# Patient Record
Sex: Female | Born: 1950 | Race: Black or African American | Hispanic: No | Marital: Married | Smoking: Never smoker
Health system: Southern US, Community
[De-identification: ages and names within clinical notes are randomized; demographics above are authoritative.]

## PROBLEM LIST (undated history)

## (undated) DIAGNOSIS — Z8739 Personal history of other diseases of the musculoskeletal system and connective tissue: Secondary | ICD-10-CM

## (undated) DIAGNOSIS — I10 Essential (primary) hypertension: Secondary | ICD-10-CM

## (undated) DIAGNOSIS — L0390 Cellulitis, unspecified: Secondary | ICD-10-CM

## (undated) DIAGNOSIS — L97109 Non-pressure chronic ulcer of unspecified thigh with unspecified severity: Secondary | ICD-10-CM

## (undated) DIAGNOSIS — Z8614 Personal history of Methicillin resistant Staphylococcus aureus infection: Secondary | ICD-10-CM

## (undated) DIAGNOSIS — E119 Type 2 diabetes mellitus without complications: Secondary | ICD-10-CM

## (undated) DIAGNOSIS — I2699 Other pulmonary embolism without acute cor pulmonale: Secondary | ICD-10-CM

## (undated) HISTORY — DX: Personal history of Methicillin resistant Staphylococcus aureus infection: Z86.14

## (undated) HISTORY — DX: Non-pressure chronic ulcer of unspecified thigh with unspecified severity: L97.109

## (undated) HISTORY — DX: Personal history of other diseases of the musculoskeletal system and connective tissue: Z87.39

## (undated) HISTORY — DX: Essential (primary) hypertension: I10

## (undated) HISTORY — DX: Cellulitis, unspecified: L03.90

## (undated) HISTORY — DX: Type 2 diabetes mellitus without complications: E11.9

## (undated) HISTORY — DX: Other pulmonary embolism without acute cor pulmonale: I26.99

## (undated) HISTORY — PX: PARTIAL HYSTERECTOMY: SHX80

---

## 2001-09-19 ENCOUNTER — Encounter: Payer: Self-pay | Admitting: *Deleted

## 2001-09-19 ENCOUNTER — Emergency Department (HOSPITAL_COMMUNITY): Admission: EM | Admit: 2001-09-19 | Discharge: 2001-09-19 | Payer: Self-pay | Admitting: *Deleted

## 2001-09-24 ENCOUNTER — Ambulatory Visit (HOSPITAL_COMMUNITY): Admission: RE | Admit: 2001-09-24 | Discharge: 2001-09-24 | Payer: Self-pay | Admitting: Pulmonary Disease

## 2001-10-15 ENCOUNTER — Ambulatory Visit (HOSPITAL_COMMUNITY): Admission: RE | Admit: 2001-10-15 | Discharge: 2001-10-15 | Payer: Self-pay | Admitting: Internal Medicine

## 2001-10-22 ENCOUNTER — Encounter: Payer: Self-pay | Admitting: Obstetrics & Gynecology

## 2001-10-22 ENCOUNTER — Ambulatory Visit (HOSPITAL_COMMUNITY): Admission: RE | Admit: 2001-10-22 | Discharge: 2001-10-22 | Payer: Self-pay | Admitting: Obstetrics & Gynecology

## 2004-12-26 ENCOUNTER — Ambulatory Visit (HOSPITAL_COMMUNITY): Admission: RE | Admit: 2004-12-26 | Discharge: 2004-12-26 | Payer: Self-pay | Admitting: Pulmonary Disease

## 2005-01-02 ENCOUNTER — Ambulatory Visit: Payer: Self-pay | Admitting: Orthopedic Surgery

## 2005-01-23 ENCOUNTER — Ambulatory Visit: Payer: Self-pay | Admitting: Orthopedic Surgery

## 2005-04-17 ENCOUNTER — Ambulatory Visit: Payer: Self-pay | Admitting: Orthopedic Surgery

## 2005-07-10 ENCOUNTER — Ambulatory Visit: Payer: Self-pay | Admitting: Orthopedic Surgery

## 2005-10-09 ENCOUNTER — Ambulatory Visit: Payer: Self-pay | Admitting: Orthopedic Surgery

## 2006-01-08 ENCOUNTER — Ambulatory Visit: Payer: Self-pay | Admitting: Orthopedic Surgery

## 2006-02-20 HISTORY — PX: OTHER SURGICAL HISTORY: SHX169

## 2006-04-10 ENCOUNTER — Ambulatory Visit: Payer: Self-pay | Admitting: Orthopedic Surgery

## 2006-07-10 ENCOUNTER — Ambulatory Visit: Payer: Self-pay | Admitting: Orthopedic Surgery

## 2006-09-11 ENCOUNTER — Ambulatory Visit: Payer: Self-pay | Admitting: Orthopedic Surgery

## 2006-09-11 DIAGNOSIS — Z8679 Personal history of other diseases of the circulatory system: Secondary | ICD-10-CM | POA: Insufficient documentation

## 2006-09-11 DIAGNOSIS — E119 Type 2 diabetes mellitus without complications: Secondary | ICD-10-CM | POA: Insufficient documentation

## 2007-01-02 ENCOUNTER — Ambulatory Visit (HOSPITAL_COMMUNITY): Admission: RE | Admit: 2007-01-02 | Discharge: 2007-01-02 | Payer: Self-pay | Admitting: General Surgery

## 2007-01-02 ENCOUNTER — Encounter (INDEPENDENT_AMBULATORY_CARE_PROVIDER_SITE_OTHER): Payer: Self-pay | Admitting: General Surgery

## 2007-01-28 ENCOUNTER — Ambulatory Visit (HOSPITAL_COMMUNITY): Admission: RE | Admit: 2007-01-28 | Discharge: 2007-01-28 | Payer: Self-pay | Admitting: General Surgery

## 2007-01-28 ENCOUNTER — Encounter (INDEPENDENT_AMBULATORY_CARE_PROVIDER_SITE_OTHER): Payer: Self-pay | Admitting: General Surgery

## 2007-02-07 ENCOUNTER — Inpatient Hospital Stay (HOSPITAL_COMMUNITY): Admission: AD | Admit: 2007-02-07 | Discharge: 2007-02-13 | Payer: Self-pay | Admitting: General Surgery

## 2007-02-21 DIAGNOSIS — I2699 Other pulmonary embolism without acute cor pulmonale: Secondary | ICD-10-CM

## 2007-02-21 HISTORY — DX: Other pulmonary embolism without acute cor pulmonale: I26.99

## 2007-03-18 ENCOUNTER — Inpatient Hospital Stay (HOSPITAL_COMMUNITY): Admission: EM | Admit: 2007-03-18 | Discharge: 2007-03-22 | Payer: Self-pay | Admitting: Emergency Medicine

## 2007-04-07 ENCOUNTER — Observation Stay (HOSPITAL_COMMUNITY): Admission: EM | Admit: 2007-04-07 | Discharge: 2007-04-09 | Payer: Self-pay | Admitting: Emergency Medicine

## 2007-04-08 ENCOUNTER — Encounter (HOSPITAL_BASED_OUTPATIENT_CLINIC_OR_DEPARTMENT_OTHER): Admission: RE | Admit: 2007-04-08 | Discharge: 2007-04-20 | Payer: Self-pay | Admitting: Surgery

## 2007-04-21 ENCOUNTER — Encounter (HOSPITAL_BASED_OUTPATIENT_CLINIC_OR_DEPARTMENT_OTHER): Admission: RE | Admit: 2007-04-21 | Discharge: 2007-07-20 | Payer: Self-pay | Admitting: Surgery

## 2007-07-22 ENCOUNTER — Encounter (HOSPITAL_BASED_OUTPATIENT_CLINIC_OR_DEPARTMENT_OTHER): Admission: RE | Admit: 2007-07-22 | Discharge: 2007-08-20 | Payer: Self-pay | Admitting: Surgery

## 2007-09-04 ENCOUNTER — Encounter (HOSPITAL_BASED_OUTPATIENT_CLINIC_OR_DEPARTMENT_OTHER): Admission: RE | Admit: 2007-09-04 | Discharge: 2007-11-15 | Payer: Self-pay | Admitting: Internal Medicine

## 2007-09-30 ENCOUNTER — Emergency Department (HOSPITAL_COMMUNITY): Admission: EM | Admit: 2007-09-30 | Discharge: 2007-09-30 | Payer: Self-pay | Admitting: Emergency Medicine

## 2007-11-15 ENCOUNTER — Encounter (HOSPITAL_BASED_OUTPATIENT_CLINIC_OR_DEPARTMENT_OTHER): Admission: RE | Admit: 2007-11-15 | Discharge: 2007-12-31 | Payer: Self-pay | Admitting: Internal Medicine

## 2010-07-05 NOTE — Assessment & Plan Note (Signed)
[facility]   NAME:  [name], [name]                 ACCOUNT NO.:  [id number]   MEDICAL RECORD NO.:  [id number]      DATE OF BIRTH:  Nov 30, 1950   PHYSICIAN:  Jonelle Sports. Sevier, M.D.       VISIT DATE:                                   OFFICE VISIT   HISTORY:  This is a 60 year old black female morbidly obese, who is seen  with areas of fat necrosis on the medial aspects of her right thigh and  one posteriorly on that leg just above the knee.  These have previously  been debrided and most recently been treated simply with daily  cleansing, rinsing with Dakin's solution, and dry dressing.   She reports today no new problems since the last visit here and she  feels as if the posterior lesion has indeed healed.   PHYSICAL EXAMINATION:  VITAL SIGNS:  Blood pressure 130/84, pulse 84,  respirations 18, and temperature 97.9.  Capillary blood glucose 79  mg/dL.   The lesion on the right posterior thigh just proximal to the knee crease  is indeed healed.  The lesion on the right medial thigh measures 0.6 x  1.3 x 0.1 cm is quite superficial and the larger lesion measuring 5.0 x  3.9 x 2.0 cm with a small tract of 2.1 cm at the 4 o'clock position is  noted on the redundant skin from the proximal medial thigh.   It has small amount of fibrinopurulent exudate in the base, but  generally is reasonably granular.   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   DISPOSITION:  1. The wound on the right posterior thigh is resolved and requires no      treatment.  2. The small superficial wound on the right medial distal thigh is to      be cleansed daily with antibacterial soap and treated with      application of zinc oxide ointment.   The larger wound on the redundant skin of the proximal thigh is to be  cleansed daily with antibacterial soap then irrigated with 0.25% Dakin  solution and then packed with moist gauze.   In the clinic today  that latter wound is packed simply with dry gauze.   The patient will continue these interventions with assistance of a home  health nurse on twice daily basis and her husband on other days and will  be seen again in followup in 2 weeks.           ______________________________  Jonelle Sports Cheryll Cockayne, M.D.     RES/MEDQ  D:  06/12/2007  T:  06/13/2007  Job:  [id number]

## 2010-07-05 NOTE — Assessment & Plan Note (Signed)
[facility]   NAME:  [name], [name]                 ACCOUNT NO.:  [id number]   MEDICAL RECORD NO.:  [id number]      DATE OF BIRTH:  Jul 11, 1950   PHYSICIAN:  Jake Shark A. Tanda Rockers, M.D. VISIT DATE:  05/14/2007                                   OFFICE VISIT   SUBJECTIVE:  Ms. [name] returns for followup of an abscess related to  fatty necrosis involving the medial thighs.  There has been an interim  management by the home health nurse with daily irrigations with 0.25%  Dakin's solution and loose packing.  There has been no interim fever or  excessive drainage or malodor.   OBJECTIVE:  Blood pressure is 154/77, respirations 18, pulse rate 88,  temperature 97.4.  Capillary blood glucose is 84 mg percent.  Inspection  of the wound shows that the right medial distal thigh ulcer is  completely granulated.  There is evidence of contraction with advancing  epithelium.  The proximal right medial thigh wound has contracted.  There is less volume.  The tract previously noted at 12 o'clock has  decreased significantly.  There is no malodor.  The tract is still  present,  but it measures 5 cm when sounded with a Q-tip.  Wound #3 on  the posterior thigh is 100% granulated with advancing epithelium and no  excessive drainage.   ASSESSMENT:  Improvement of abscess cavity.   PLAN:  We will continue the daily irrigations with 0.25% Dakin's  solution,  continued antiseptic soap washes and reevaluate the patient  in 2 weeks.  The home health nurse will continue the daily visits.      Harold A. Tanda Rockers, M.D.  Electronically Signed     HAN/MEDQ  D:  05/14/2007  T:  05/14/2007  Job:  [id number]

## 2010-07-05 NOTE — Discharge Summary (Signed)
NAME:  [name], [name]                 ACCOUNT NO.:  [id number]   MEDICAL RECORD NO.:  [id number]          PATIENT TYPE:  INP   LOCATION:  A206                          FACILITY:  [facility]   PHYSICIAN:  Edward L. Juanetta Gosling, M.D.DATE OF BIRTH:  [birth year]   DATE OF ADMISSION:  03/17/2007  DATE OF DISCHARGE:  01/30/2009LH                               DISCHARGE SUMMARY   FINAL DISCHARGE DIAGNOSES:  1. Bilateral pulmonary emboli.  2. 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  3. Osteoarthritis.  4. Diabetes mellitus.  5. Morbid obesity.  6. Hypertension.  7. Anemia.   HISTORY:  [name] is a 60 year old morbidly obese female who had been  feeling fairly well until about 6 o'clock on the night of admission, and  at that point, she developed a fluttering sensation in her chest.  She  became more short of breath.  She came to the emergency room had D-dimer  which was significantly elevated.  She had multiple large pulmonary  emboli.  She has had a recent surgery on an ulceration on her thigh.   PHYSICAL EXAMINATION:  VITAL SIGNS:  Temperature is 98, blood pressure  126/81, O2 sat 100%, heart rate 109, respirations 17, initially 25 down  to 17.  CHEST:  Fairly clear.  EXTREMITIES:  She had ulcerations of the pannus, one on the right thigh.   LABORATORY DATA:  White count was 12,300, hemoglobin 10.8   HOSPITAL COURSE:  She was treated with intravenous heparin initially,  then treated with Lovenox.  She was bridged with Coumadin.  Had  consultation with Dr. Leticia Penna to reevaluate her leg wounds, and he  ordered dressings.  By the time of discharge, she was therapeutically  anticoagulated on Coumadin.  She was discharged home on Coumadin 5 mg  daily with daily prothrombin times from home health.   DISCHARGE MEDICATIONS:  1. Doxycycline 100 mg x b.i.d. x7 more days.  2. Actos 200 mg daily.  3. Celebrex 2 mg b.i.d.  4. Fluconazole:  She has actually finished the fluconazole.  5.  Lasix 40 mg daily.  6. Glyburide.  7. Metformin 500 b.i.d.  8. Tylox one q.6 h p.r.n. pain.  9. Verapamil 240 mg daily.  10.Trandolapril 4 mg daily.   DISCHARGE INSTRUCTIONS:  She is going to have home health for physical  therapy for wound care, etc.  She is going to follow-up in my office in  about 2 weeks.      Edward L. Juanetta Gosling, M.D.  Electronically Signed     ELH/MEDQ  D:  03/22/2007  T:  03/22/2007  Job:  [id number]

## 2010-07-05 NOTE — Group Therapy Note (Signed)
NAME:  Woolverton, Acelyn                 ACCOUNT NO.:  [id number]   MEDICAL RECORD NO.:  [id number]          PATIENT TYPE:  OBV   LOCATION:  A304                          FACILITY:  [facility]   PHYSICIAN:  Edward L. Juanetta Gosling, M.D.DATE OF BIRTH:  1950-06-12   DATE OF PROCEDURE:  DATE OF DISCHARGE:                                 PROGRESS NOTE   Ms. Quimby was admitted yesterday with hypoglycemia.  She says she is  better but she is still on dextrose containing IV fluids.  She is  actually set to be admitted to [facility] tomorrow.  Otherwise she says she is feeling okay.  She had pretty significant  changes when she had the hypoglycemia, and actually was thought for  sometime to have had a stroke.   EXAM:  This morning shows temperature is 97.1, pulse 71, respirations  20, blood pressure 160/80, O2 sats 98% on room air blood sugar is 140  and was 150 earlier.   ASSESSMENT:  She has hypoglycemia this is from oral hypoglycemic agents,  so may be more of a problem.  I am going to see what happens if we cut  her from the D5 have her eat, and see if her sugar goes down.  I may  just try to have her directly transferred from here to the [facility].      Edward L. Juanetta Gosling, M.D.  Electronically Signed     ELH/MEDQ  D:  04/08/2007  T:  04/08/2007  Job:  [id number]

## 2010-07-05 NOTE — Assessment & Plan Note (Signed)
[facility]   NAME:  [name], [name]                 ACCOUNT NO.:  [id number]   MEDICAL RECORD NO.:  [id number]      DATE OF BIRTH:  Jul 15, 1950   PHYSICIAN:  Jake Shark A. Tanda Rockers, M.D. VISIT DATE:  04/23/2007                                   OFFICE VISIT   SUBJECTIVE:  [name] is a 60 year old lady who we are treating for  lower extremity fatty necrosis.  In the interim, she has had  continuation of home health nurse visits for irrigation of the wounds in  addition to b.i.d. showers with antiseptic soap.  There has been an  extremely malodorous drainage from the medial aspect of the right lower  extremity.  There has been a sensation of fever, but the patient does  not take her temperature.   OBJECTIVE:  Blood pressure is 160/95, respirations 20, pulse rate 97,  temperature 97.7. She is accompanied by a family member.  Inspection of the lower extremity shows that there are prominent areas  of adipose tissue medially with the right medial distal thigh containing  a full-thickness ulcer with healthy granulation.  This wound measures  3.4 cm in its broadest diameter.  The wound was photographed and  cataloged.  Wound #2 is a highly indurated area measuring 5 cm in  diameter and extending to depth of 5-6 cm.  There is old malodorous  necrotic fatty tissue associated with some old hemorrhage loculations  which were broken down thus disrupting a frank abscess from the  posterior medial area.  The wound was irrigated with saline.  The wound  #3 on the right posterior thigh is completely granulated with evidence  of advancing epithelium.   ASSESSMENT:  Adequately drained abscess, IE, wound #2 with granulating  wounds numbers 1 and 3, please refer to the data entries and photos.   PLAN:  We will continue daily irrigations and packing of wound #2 with a  1 inch Nu Gauze packing.  We have given these instructions to the home  health nurse.  Wounds one and three will continue  to be treated with  antiseptic soap washing and a moist moist dressing.  We will reevaluate  the patient in 1 week p.r.n. The culture has been taken.  No antibiotics  have been started however.      Harold A. Tanda Rockers, M.D.  Electronically Signed     HAN/MEDQ  D:  04/23/2007  T:  04/23/2007  Job:  [id number]

## 2010-07-05 NOTE — Op Note (Signed)
NAME:  Aderhold, Cathryn                 ACCOUNT NO.:  [id number]   MEDICAL RECORD NO.:  [id number]          PATIENT TYPE:  AMB   LOCATION:  DAY                           FACILITY:  [facility]   PHYSICIAN:  Tilford Pillar, MD      DATE OF BIRTH:  01/09/1951   DATE OF PROCEDURE:  DATE OF DISCHARGE:                               OPERATIVE REPORT   PREPROCEDURE DIAGNOSIS:  Right posterior thigh chronic wound/ulcer.   POSTPROCEDURE DIAGNOSIS:  Right posterior thigh chronic wound/ulcer.   PROCEDURE PERFORMED:  Right posterior thigh wound debridement.   SURGEON:  Tilford Pillar, MD   ANESTHESIA:  1% Lidocaine with epinephrine.   SPECIMENS:  Necrotic eschar.   ESTIMATED BLOOD LOSS:  Minimal.   INDICATIONS FOR PROCEDURE:  The patient is a 60 year old female with  history of diabetes and morbid obesity.  She had a chronic wound develop  on her right posteromedial thigh.  This had been treated unsuccessfully  over a several month period with no significant improvement.  She had  exquisite pain in this area and intermittent infections, fevers and  chills.  On evaluation as an outpatient it was advised that the patient  have this debrided due to the size of the wound, the location and doubts  of patient being able to adequately care for this area.  It was  recommended that a wound vac be placed; this is currently being  arranged.  The risks and benefits were discussed for debridement and we  will proceed with debridement at this time.   DESCRIPTION OF PROCEDURE:  The patient was taken to the minor procedure  room.  She was in the supine position.  Her thigh was flexed, allowing  exposure to the posterior aspect where the wound was located.  Sterile  drapes were placed after prepping the area with Betadine solution.  At  this point sharp dissection with a scalpel was utilized to dissect the  eschar free.  Lidocaine 1% with epinephrine was utilized for local  anesthetic upon reaching the healthy  tissue.  Also upon reaching healthy  tissue, good vascularity was encountered.  Electrocautery was utilized  to obtain hemostasis.  The wound was irrigated.  The eschar was sent to  Pathology.  A Betadine-soaked 4x4 gauze was placed for packing. This was  covered with a dry 4x4 and then an ABD dressing.  This was secured with  a Medipore dressing.  The patient tolerated the procedure well.  There  is significant inflammatory and edematous tissue around this area, but  no tracking.  No subcuticular abscesses were encountered.  The eschar  was removed down to healthy tissue.  The remaining wound defect is 3.5  cm wide and 3.5 cm in length and a depth of 2.5 cm.  At the conclusion  of the procedure all instrument, sponge and needle counts were correct.  The patient tolerated this well.   DISPOSITION:  To home with continued b.i.d. wet-to-dry dressing changes  which were described to the patient and the patient's husband, with plan  to follow up and expected placement of a  wound vac as an outpatient.      Tilford Pillar, MD  Electronically Signed     BZ/MEDQ  D:  01/02/2007  T:  01/03/2007  Job:  [id number]   cc:   Dr. Juanetta Gosling

## 2010-07-05 NOTE — Assessment & Plan Note (Signed)
[facility]   NAME:  [name], [name] NO.:  [id number]   MEDICAL RECORD NO.:  [id number]      DATE OF BIRTH:  [birth year]   PHYSICIAN:  Maxwell Caul, M.D. VISIT DATE:  11/22/2007                                   OFFICE VISIT   [name] is a very pleasant 60 year old woman who has three very  problematic ulcers that we have been following for some period of time  now.  She lives in [place] and is currently without insurance which  has limited our options in terms of consultations.  I have been  continually puzzled by the pathogenesis of these wounds and in fact, I  have biopsied the one on her left anterior thigh.  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  This certainly does not fit  with what I know about necrobiosis lipoidica which I have seen in  facilities on patients with chronically poor controlled diabetes.  These  are usually waxy plaques.  They are not in my mind in the pathogenesis  of huge ulcers.  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  I had wondered about a Dermatology consult but in the  absence of insurance, this is currently out of the question.   We have been applying Silvadene cream in these wounds.  She did culture  Proteus on November 12, 2007.  She was treated with Augmentin which  should cover this.   On examination, temperature is 98.3.  There are three very large wounds  here.  One we have labeled #2 is on her right abdominal pannus.  This is  a deep wound measuring 4.5 x 4.2 x 1.3 with 1 cm tunneling at 1 o'clock.  This does not look any different over the last month, certainly has not  progressed.  The area on the left anterior thigh looks worse to me  measuring 4.5 x 5 x 1.4.  This underwent an excisional debridement  of a  large amount of adherent material and subcutaneous material.  Nevertheless, I was not able to completely clean this out because of  pain.  She also has a wound, the most recent one on her upper abdomen  measuring 4.1 x 2 x 0.1, this is a clean granulated wound.   IMPRESSIONS:  Multiple ulcers as described above.  I continue to think  that this is some form of panniculitis.  I will look into this.  Want to  solicit consultation.  I would favor Dermatology.  Dr. Chilton Si mentioned  sending her to Plastics which might be easier within the organization.  In the meantime, I am going to continue to apply Silvadene-soaked gauze  to these wounds which she can change twice a day.  We will see her back  in 2 weeks.           ______________________________  Maxwell Caul, M.D.     MGR/MEDQ  D:  11/22/2007  T:  11/23/2007  Job:  [id number]

## 2010-07-05 NOTE — Group Therapy Note (Signed)
NAME:  Drudge, Avalyn                 ACCOUNT NO.:  [id number]   MEDICAL RECORD NO.:  [id number]          PATIENT TYPE:  INP   LOCATION:  A206                          FACILITY:  [facility]   PHYSICIAN:  Edward L. Juanetta Gosling, M.D.DATE OF BIRTH:  09-22-1950   DATE OF PROCEDURE:  03/21/2007  DATE OF DISCHARGE:                                 PROGRESS NOTE   Ms. Rashid is overall doing better.  She did have her ultrasound of the  arm yesterday, and her PIC line apparently is okay, at least no  thrombus.  Her blood pressure today is 152/84.  Temperature 99.5.  Pulse  94.  Respirations 17.  Blood sugar 109.  Her O2 saturation is 97%.  Her  leg looks about the same.   ASSESSMENT:  I think she is doing fairly well.  I am concerned about her  ambulation, so I am going to go ahead and ask for Physical Therapy  consultation, and try to get her up and moving around.  See if we can  get her, perhaps, ready for discharge tomorrow.  She should be finished  with her last of the 5 days of Lovenox today, so I am going to see what  I can do with all of that.  Her potassium is 3.4.  Her protime 30.7 with  an INR of 2.8, so she is therapeutically anticoagulated with the  Coumadin.  After today, she can come off Lovenox, and be anticoagulated  on Coumadin.  We will get the Physical Therapy consultation.  Continue  with her treatments and followup.      Edward L. Juanetta Gosling, M.D.  Electronically Signed     ELH/MEDQ  D:  03/21/2007  T:  03/21/2007  Job:  [id number]

## 2010-07-05 NOTE — Assessment & Plan Note (Signed)
[facility]   NAME:  PASSTekisha, Darcey                 ACCOUNT NO.:  [id number]   MEDICAL RECORD NO.:  [id number]      DATE OF BIRTH:  27-Jan-1951   PHYSICIAN:  Jake Shark A. Tanda Rockers, M.D. VISIT DATE:  05/28/2007                                   OFFICE VISIT   SUBJECTIVE:  Zinia Pulaski is a 60 year old lady who we have followed for  three separate areas of fatty necrosis.  In the interim, she has been  seen daily by the visiting home nurse with antiseptic soap washes  followed by Dakin's irrigation and packing of the more proximal medial  thigh wound.  There has been no interim fever or malodor.   OBJECTIVE:  Blood pressure is 142/80, respirations 16, pulse rate 90,  temperature 98.4. Inspection of the lower extremity shows that all  wounds show significant contraction. They are now healthy-appearing  granulating wounds with advancing epithelium clearly identified in  wounds #1 and #3. Wound #2 has decreased significantly in volume and  there is now no malodorous exudate.   ASSESSMENT:  Clinical improvement of fatty necrosis ulcerations.   PLAN:  We will continue home health nurses visitation daily for an  additional 2 weeks at the end of which we will reevaluate the patient.      Harold A. Tanda Rockers, M.D.  Electronically Signed     HAN/MEDQ  D:  05/28/2007  T:  05/28/2007  Job:  [id number]

## 2010-07-05 NOTE — H&P (Signed)
NAME:  [name], [name]                 ACCOUNT NO.:  [id number]   MEDICAL RECORD NO.:  [id number]          PATIENT TYPE:  OBV   LOCATION:  A304                          FACILITY:  [facility]   PHYSICIAN:  Angus G. Renard Matter, MD   DATE OF BIRTH:  1950/03/04   DATE OF ADMISSION:  04/07/2007  DATE OF DISCHARGE:  [facility]                              HISTORY & PHYSICAL   HISTORY AND PHYSICAL - ADDITIONAL DICTATION   PHYSICAL EXAMINATION:  EXTREMITIES:  The patient has wounds on the  medial aspect of right thigh, ulcerative-type skin lesions.      Angus G. Renard Matter, MD  Electronically Signed     AGM/MEDQ  D:  04/07/2007  T:  04/07/2007  Job:  [id number]

## 2010-07-05 NOTE — Assessment & Plan Note (Signed)
[facility]   NAME:  PASSAnnabel, Gibeau                 ACCOUNT NO.:  [id number]   MEDICAL RECORD NO.:  [id number]      DATE OF BIRTH:  January 18, 1951   PHYSICIAN:  Jake Shark A. Tanda Rockers, M.D. VISIT DATE:  04/30/2007                                   OFFICE VISIT   SUBJECTIVE:  Ms. Cornman is a 60 year old female whom we follow for fatty  necrosis and an abscess of the medial aspect of the right thigh.  In the  interim, she has been visited on an every-other-day basis by the [facility] nurse for Dakin's irrigation and packing.  She reports that there  has been moderate drainage with some malodor.  She denies fever.   OBJECTIVE:  VITAL SIGNS:  Her blood pressure is 120/73, respirations are  20, pulse rate 87, temperature is 98.5.  Capillary blood glucose is 115  mg%.  GENERAL:  She is accompanied by her husband.  EXTREMITIES:  Inspection of the lower extremity shows that there is 2+  bilateral edema.  The wound #2 on the proximal medial thigh extends 5 cm  into the subcutaneous tissue with a pungent malodor and a purulent  exudate demonstrated on the Q-tip.  There are no loculations.  Wound #3  shows clinical improvement.  It is located on the right posterior thigh,  and wound #1 on the right medial distal thigh has similarly improved.  All the wounds were photographed, cataloged and measured.  Please refer  to the data entries.   ASSESSMENT:  Inadequate drainage of wound #2.   PLAN:  We will increase the frequency of the gauze packings of wound #2  to daily care utilizing a Dakin's irrigation and a Dakin's packing.  We  have given orders for the Home Health nurse.  We will reevaluate the  patient in 2 weeks p.r.n.   We have explained the change in therapy to the patient and her husband  in terms that they seem to understand.  They express gratitude for  having been seen in the clinic and indicate that they will be compliant.      Harold A. Tanda Rockers, M.D.  Electronically Signed     HAN/MEDQ  D:  04/30/2007  T:  05/01/2007  Job:  [id number]

## 2010-07-05 NOTE — Discharge Summary (Signed)
NAME:  [name], [name]                 ACCOUNT NO.:  [id number]   MEDICAL RECORD NO.:  [id number]          PATIENT TYPE:  OBV   LOCATION:  A304                          FACILITY:  [facility]   PHYSICIAN:  Edward L. Juanetta Gosling, M.D.DATE OF BIRTH:  [birth year]   DATE OF ADMISSION:  04/07/2007  DATE OF DISCHARGE:  02/17/2009LH                               DISCHARGE SUMMARY   REASON FOR OBSERVATION:  Hypoglycemia.   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   Ms. Briere is a 60 year old who came to the emergency room with low blood  sugar.  She had been having blood sugars that had been pretty good, and  then she developed a low blood sugar, which was a new episode for her.  She became very poorly responsive.  Her family thought she had had a  stroke, and they brought her to the emergency room.  She was found to  have hypoglycemia.  She responded well to treatment in the emergency  room, but because she was on oral hypoglycemic agents and these  sometimes are very long-acting and can have recurrent episodes of  hypoglycemia, it was felt she should be observed in the hospital, which  was done.   PHYSICAL EXAMINATION:  GENERAL:  Her exam on admission showed that she  was morbidly obese.  She had a wound on her thigh that was inflamed and  irritated,  and she actually had an appointment to go to the wound  center for that.  CHEST:  Clear.  HEART:  Regular.  ABDOMEN:  Soft.  EXTREMITIES:  Pulses were intact.   ASSESSMENT:  Assessment at the time of admission was that she had  hypoglycemia.   PLAN:  She was given IV dextrose with marked improvement, but her blood  sugar was still only in the low 100s the next morning.  I elected to  keep her in the hospital in observation at that point to see if she was  able to keep her blood sugar up off of the dextrose-containing IV  fluids.  She was able to  maintain that, so the next morning she was  discharged home.  She had an appointment to follow up as an outpatient  in the wound center to see if we could get her leg wound better treated.      Edward L. Juanetta Gosling, M.D.  Electronically Signed     ELH/MEDQ  D:  04/10/2007  T:  04/11/2007  Job:  [id number]

## 2010-07-05 NOTE — Assessment & Plan Note (Signed)
[facility]   NAME:  [name], Teagle                 ACCOUNT NO.:  [id number]   MEDICAL RECORD NO.:  [id number]      DATE OF BIRTH:  1950/11/29   PHYSICIAN:  Jake Shark A. Tanda Rockers, M.D. VISIT DATE:  07/05/2007                                   OFFICE VISIT   SUBJECTIVE:  Ms. 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  In the  interim, we have treated her with zinc oxide and dry pad and an iodoform  pack. She has also been on a 2 week course of Cipro 500 mg p.o. b.i.d.  There has been no interim fever.  She is minimally ambulatory.  There  has been no pain.   OBJECTIVE:  VITAL SIGNS:  Blood pressure is 177/88, respirations 18,  pulse rate 100, temperature 97.9, capillary blood glucose is 71 mg  percent.  EXTREMITIES:  Inspection of the lower extremity shows that the distal  wound on the right medial thigh has decreased significantly.  There is a  dry eschar with scant exudate.  There is no evidence of infection or  abscess.  Her wound is nontender to manipulation proximally on the  medial thighs.  Wound #2, this wound has contracted significantly and  now has 100% granulating base with a halo of advancing epithelium.  There are no other obvious ulcers.   ASSESSMENT:  Clinical improvement.   PLAN:  We will continue local hygiene utilizing antibacterial soap and  iodoform packing.  We will reevaluate the patient in 2 weeks p.r.n.      Harold A. Tanda Rockers, M.D.  Electronically Signed     HAN/MEDQ  D:  07/05/2007  T:  07/06/2007  Job:  [id number]

## 2010-07-05 NOTE — Group Therapy Note (Signed)
NAME:  [name], [name]                 ACCOUNT NO.:  [id number]   MEDICAL RECORD NO.:  [id number]          PATIENT TYPE:  INP   LOCATION:  A206                          FACILITY:  [facility]   PHYSICIAN:  Edward L. Juanetta Gosling, M.D.DATE OF BIRTH:  [birth year]   DATE OF PROCEDURE:  DATE OF DISCHARGE:                                 PROGRESS NOTE   Ms. Applin was admitted with pulmonary embolus, a large wound on her leg  which has had surgery by Dr. Girtha Rm.  She is doing well now.  She has  been able to get up.  She feels okay.  She is therapeutically  anticoagulated.   PHYSICAL EXAM:  Shows her blood pressure is 159/85, temperature is 98.3,  pulse 85, respirations 17, blood sugars down in the 130s now.   ASSESSMENT:  She is better.   PLAN:  Is to discharge home.  Today her PT/INR are 29.7 and 2.7  respectively.  Please see discharge summary for details.      Edward L. Juanetta Gosling, M.D.  Electronically Signed     ELH/MEDQ  D:  03/22/2007  T:  03/22/2007  Job:  [id number]

## 2010-07-05 NOTE — H&P (Signed)
NAME:  [name]                 ACCOUNT NO.:  [id number]   MEDICAL RECORD NO.:  [id number]          PATIENT TYPE:  AMB   LOCATION:  DAY                           FACILITY:  [facility]   PHYSICIAN:  Tilford Pillar, MD      DATE OF BIRTH:  1950/12/14   DATE OF ADMISSION:  DATE OF DISCHARGE:  LH                              HISTORY & PHYSICAL   CHIEF COMPLAINT:  Open area on thigh, not healing.   HISTORY OF PRESENT ILLNESS:  The patient is a 60 year old African-  American female who is morbidly obese with history of diabetes mellitus,  type 2, non-insulin-dependent.  She has had approximately four weeks of  a notable ulceration and draining wound on her posterior right thigh.  During the course multiple treatments with Silvadene and Bacitracin  Ointment have been attempted, with no success at resolving this  ulceration.  She has noted no increase in size, but has also noted no  decrease in size.  She has had minimal drainage.  There is some odor to  the wound.  She denied any fever or chills.  No additional wound care  has been implemented.  She denies any claudication or lower extremity  pain other than some bilateral knee pain.   PAST MEDICAL HISTORY:  1. Osteoarthritis.  2. Diabetes mellitus, type 2, non-insulin-dependent.  3. Morbid obesity.  4. Hypertension.   PAST SURGICAL HISTORY:  Hysterectomy.   MEDICATIONS:  1. Actos 30 mg daily.  2. Celebrex 200 mg b.i.d.  3. Glipizide/Metformin 5/500 mg.  4. Verapamil 240 mg.  5. Furosemide 40 mg.  6. Fluconazole 100 mg.  7. Cefuroxime 500 mg   ALLERGIES:  No known drug allergies.   SOCIAL HISTORY:  The patient denies tobacco use, denies alcohol use.  No  recreational drug use.  The patient is a CNA.   FAMILY HISTORY:  Father with history of colon cancer; otherwise  unremarkable.   REVIEW OF SYSTEMS:  CONSTITUTIONAL:  Occasional chills.  EYES:  Unremarkable. EARS, NOSE AND THROAT:  Occasional sinusitis.  RESPIRATORY:   Unremarkable.  CARDIOVASCULAR:  Unremarkable.  GASTROINTESTINAL:  Unremarkable.  GENITOURINARY:  Occasional urinary  retention.  MUSCULOSKELETAL:  Arthralgias.  Occasional weakness.  SKIN:  Unremarkable.  ENDOCRINE:  Unremarkable.  NEUROLOGIC:  Unremarkable.   PHYSICAL EXAMINATION:  (Performed on 12/20/2006)  GENERAL:  The patient is morbidly obese.  She is calm, in no acute  distress.  HEENT:  Scalp demonstrates no deformities, no masses.  Pupils equal,  round, reactive to light.  Extraocular movements are intact.  She has no  diminished hearing appreciated.  Oral mucosa is pink with normal  occlusion.  CARDIOVASCULAR:  Regular rate and rhythm.  PULMONARY:  Unlabored respirations.  EXTREMITIES:  Skin is warm and dry.  On her right posterior medial thigh  she has a large ulceration which is approximately 1-2 cm in depth with  notable sloughing necrotic tissue in the center of the ulceration.  She  has significant firm, edematous tissue surrounding the ulceration.  This  area is indurated.  It is tender to palpation.  ASSESSMENT/PLAN:  Complicated posterior thigh ulceration.  Due to the  location of the wound and the patient's significant history of diabetes  and morbid obesity, it was recommended to her that she will require  wound debridement to help improve likelihood of resolution of this  ulcerated area.  Due to the patient's co-existing medical conditions, I  feel that she would be an excellent candidate for  Wound Vac therapy to  help decrease the duration of overall wound healing length and  additionally assist with minimizing local wound care as this is in an  area which is extremely difficult for the patient to administer self-  care.  This will require significant assistance with wound care should  conventional wound dressings be utilized.  Therefore again at this point  I feel that debridement with Wound Vac placement at the time of  debridement would offer the patient the  best potential for resolution of  this.  This was discussed with the patient and at this point we will  plan obtaining Wound Vac approval and will schedule debridement on  arranging Wound Vac and debridement on a same day basis. Risks, benefits  and alternatives of debridement were discussed with the patient and we  will proceed at this time.      Tilford Pillar, MD  Electronically Signed     BZ/MEDQ  D:  12/25/2006  T:  12/26/2006  Job:  [id number]   cc:   Short Stay Surgery   Juanetta Gosling, M.D.

## 2010-07-05 NOTE — Group Therapy Note (Signed)
NAME:  [name], [name]                 ACCOUNT NO.:  [id number]   MEDICAL RECORD NO.:  [id number]          PATIENT TYPE:  INP   LOCATION:  A304                          FACILITY:  [facility]   PHYSICIAN:  Edward L. Juanetta Gosling, M.D.DATE OF BIRTH:  [birth year]   DATE OF PROCEDURE:  DATE OF DISCHARGE:  04/09/2007                                 PROGRESS NOTE   [name] is better.  Blood sugars has been a little bit higher but she  has not eaten her breakfast yet.  Otherwise she is doing about the same.   PHYSICAL EXAM:  This morning shows she is awake, alert.  Temperature is 98.1, pulse 79, respirations 20, blood pressure 144/84,  blood sugar was 147 at 2130 hours last night, 83 this morning but she  has not eaten.  Her chest is clear.  Her wound looks about the same.   ASSESSMENT:  She has a large wound on her leg.  I am trying to establish  what the plan is for that since she is confused about it.  I talked to  the physician at the [facility] and he says that he did know of  any plans to admit her, and I am going to see if we can get the case  management to help me with that.  I do not know what to do at this  point.  I think she is ready for discharge from here, but the plan was  to transfer her to [facility], but I am not sure she is actually going  to be admitted, so I am not quite sure how to approach this.   PLAN:  Then is to continue her meds for now and follow.      Edward L. Juanetta Gosling, M.D.  Electronically Signed     ELH/MEDQ  D:  04/09/2007  T:  04/09/2007  Job:  [id number]

## 2010-07-05 NOTE — Consult Note (Signed)
NAME:  [name], [name]                 ACCOUNT NO.:  [id number]   MEDICAL RECORD NO.:  [id number]          PATIENT TYPE:  INP   LOCATION:  A206                          FACILITY:  [facility]   PHYSICIAN:  Tilford Pillar, MD      DATE OF BIRTH:  Jun 13, 1950   DATE OF CONSULTATION:  03/19/2007  DATE OF DISCHARGE:                                 CONSULTATION   REASON FOR CONSULTATION:  Right thigh chronic wounds times 2.   HISTORY OF PRESENT ILLNESS:  The patient is a 60 year old African-  American female with morbid obesity and diabetes mellitus who is well  known to me with a history of 2 chronic ulcerative wounds on her right  thigh on the posterior aspect.  These were being treated with a local  wound care for several months now.  She actually presented to the [facility]  with increasing shortness of breath and during workup was  diagnosed with DVT and suspected pulmonary embolism. At this point of  evaluation, the patient is feeling better.  Her breathing has improved  and she had denied any fever or chills.  She denies any significant  changes with the skin ulcerations.  She has been initiated on  anticoagulation therapy.   PAST MEDICAL HISTORY:  Osteoarthritis, diabetes mellitus, type 2 insulin  dependent, morbid obesity, hypertension, hypercholesterolemia, leg  ulcers times 2.   PAST SURGICAL HISTORY:  Hysterectomy.  Wound debridement on multiple  occasions of these wound ulcers.   MEDICATIONS:  Actos, Celebrex, fluconazole, furosemide, Glipizide,  Metformin, Percocet, tramadol, verapamil, Trandolapril.  She is  currently in the hospital on Coumadin and vancomycin.   ALLERGIES:  No known drug allergies.   SOCIAL HISTORY:  No tobacco.  No alcohol.  No recreational drug use.   PHYSICAL EXAMINATION:  VITAL SIGNS:  Temperature 98.1.  Heart rate 108.  Respirations  20.  Blood pressure 189/84.  GENERAL:  The patient is in no acute distress. She is alert and  oriented.  She is  morbidly obese as above mentioned.  PULMONARY:  Clear to auscultation at this time.  No labor of breathing  is noted.  CARDIOVASCULAR:  Tachy with a regular rhythm.  ABDOMEN:  Soft and obese.  EXTREMITIES:  Right thigh ulcerations are present.  Dressings are  slightly saturated.  The right posterior medial wound has slight  granulation tissue.  There is sloughing necrotic tissue within that  appears superficial. The more inferior wound has positive granulation  tissue and both are extremely tender to palpation.   ASSESSMENT AND PLAN:  Wound times 2 of the right thigh.  At this point  recommend continuing local wound care as the patient is admitted for her  pulmonary embolism will require continued evaluation while waiting for  her Coumadin to get therapeutic.  I think at this time would be  appropriate to take advantage of her hospitalization to aggressively  treat the wounds.  I do still suspect a Pseudomonal colonization or  infections of both of the wounds based on the odor and the slight  discoloration on the  dressing although this has improved significantly  since last seen in my office as an outpatient.  With that I would  continue local wound care with Dacron solution with b.i.d. wet-to-dry  dressings.  I would also recommend pulse lavage of the upper more  superior ulceration as this debridement on her last admission helped  significantly with the initiation of additional granulation tissue.  At  this point, additional debridement in the operating room should be  avoided as the wounds are very tentative in their vascular supply both  on the patient's history of diabetes as well as the patient's history of  morbid obesity.  Any granulation tissue development of this area should  be carefully monitored.  Additionally the patient has just been started  on Coumadin;  therefore, again I will watch for any bleeding from the  wounds.  This was discussed with the patient and increased  oozing from  both wounds is likely upon starting the Coumadin but at this point would  just continue to monitor.  I will continue to follow the patient while  in the hospital and appreciate the opportunity to continue with her  care.      Tilford Pillar, MD  Electronically Signed     BZ/MEDQ  D:  03/19/2007  T:  03/20/2007  Job:  [id number]

## 2010-07-05 NOTE — Assessment & Plan Note (Signed)
[facility]   NAME:  [name], [name] NO.:  [id number]   MEDICAL RECORD NO.:  [id number]      DATE OF BIRTH:  1950-03-26   PHYSICIAN:  Maxwell Caul, M.D. VISIT DATE:  09/18/2007                                   OFFICE VISIT   LOCATION:  [facility].   [name] is a patient we have been following here for a difficult pair  of ulcers.  Firstly, on the medial aspect of her right thigh in the  inferior part of a large pannus with associated stasis and lymphedema,  she has a large ulcer currently measuring 5 x 4 x 2 cm.  However, there  is an additional 2.5 cm of tunneling at roughly 12 to 2 o'clock.  This  we have been applying a moist-to-moist dressing changing twice a day and  I have tried to support this area in an Ace wrap with ABD pads over it.  More recently in the left anterior groin area, she has developed a  smaller ulcer measuring 1.9 x 3 x 0.7.  This has surrounding firm tissue  highly suggestive of some form of fat necrosis, although this is not  painful.  The wound itself is pale and somewhat dehydrated looking.   On examination, her temperature is 98, pulse 79, respirations 20, and  blood pressure 162/91.  The area on the right upper leg in the middle of  the pannus underwent a LET for anesthesia.  We used a #10 blade to  debride a large amount of adherent eschar.  This was tolerated fairly  well.  There is granulation tissue here, although it is certainly not a  good healing area.  The area on the left anterior thigh is a pale, dry-  looking wound.  However, there is a large amount of surrounding tissue  necrosis here suggesting ongoing inflammation.   IMPRESSION:  Predominantly stasis ulceration in the middle of the large  amount of lymphedema.  I view this wound is different from the one on  the left anterior thigh.  This has probably the pathophysiology of a  stasis-type wound with lymphedema.  I have  been attempting to keep this  wound clean and have a freely granulating base and then I have been  considering a wound VAC to this area.   Wound #2 on the left anterior thigh which we have labeled #3, I think,  is quite clearly a fat necrosis.  The surrounding heart subcutaneous  tissue is quite suggestive of ongoing inflammation.  I am going to have  the research this some to see if what we can come up with is a possible  avenue towards healing.  For now, we are simply keeping this clean and  topical antibiotic.  We will see her again in three weeks.           ______________________________  Maxwell Caul, M.D.     MGR/MEDQ  D:  09/18/2007  T:  09/19/2007  Job:  [id number]

## 2010-07-05 NOTE — H&P (Signed)
NAME:  [name], [name]                 ACCOUNT NO.:  [id number]   MEDICAL RECORD NO.:  [id number]          PATIENT TYPE:  INP   LOCATION:  ZO10                          FACILITY:  [facility]   PHYSICIAN:  Catalina Pizza, M.D.        DATE OF BIRTH:  1950/04/30   DATE OF ADMISSION:  03/17/2007  DATE OF DISCHARGE:  LH                              HISTORY & PHYSICAL   PRIMARY DOCTOR:  Dr. Shaune Pollack.   SURGEON:  Dr. Tilford Pillar.   CHIEF COMPLAINT:  Worsening shortness of breath.   HISTORY OF PRESENT ILLNESS:  [name] is a 60 year old morbidly obese  African female who was in her usual state of health up until  approximately 6 p.m. tonight, when she started feeling a fluttering  sensation in her chest and became more short of breath, needing  assistance from her husband to get out of the bathroom.  She came into  the emergency department short of breath and with unknown initial cause,  did get a D-dimer, which was significantly elevated, and was sent over  for a CT scan which revealed large pulmonary emboli centrally located.  At this time, she states that she does not have any further chest pain  and feels her breathing is much better on oxygen.   PAST MEDICAL HISTORY:  Significant for:  1. 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  2. Osteoarthritis.  3. Diabetes mellitus type 2, non-insulin dependent.  4. Morbid obesity.  5. Hypertension.  6. Question of hypercholesterolemia.   ALLERGIES:  NO KNOWN DRUG ALLERGIES.   PAST SURGICAL HISTORY:  Hysterectomy.   MEDICATIONS:  1. Actos 30 mg p.o. daily.  2. Celebrex 200 mg p.o. b.i.d.  3. Fluconazole 100 mg p.o. daily.  4. Furosemide 40 mg p.o. daily.  5. Glipizide/metformin 5/500 p.o. b.i.d.  6. Oxycodone/acetaminophen 5/325 p.o. q.6 h. p.r.n. for pain.  7. Tramadol/acetaminophen 37.5/325 p.o. q.i.d.  8. Verapamil 240 mg p.o. daily.  9. Trandolapril 4 mg p.o. daily.   SOCIAL HISTORY:  She denies any alcohol or tobacco.  The  patient was  previously a CNA.  She lives with her husband and has 1 son.   FAMILY HISTORY:  Father died at age 50 of colon cancer.  Mother died in  her early 69s related to diabetes, high blood pressure and possibly  heart disease.   REVIEW OF SYSTEMS:  CONSTITUTIONAL:  The patient denies any significant  chills.  No problems with ear, nose and throat.  She does states she is  more short of breath, although better at this time in the emergency  department.  Occasional thrill in her chest, but no specific pain.  No  abdominal pains.  SKIN:  Does have pain associated with these pannus  ulcers.  Does have chronic knee pain related to arthritis and her morbid  obesity.  No other endocrine or neurologic complaints.   PHYSICAL EXAMINATION:  VITAL SIGNS: Temperature is 98, blood pressure  126/81, saturating 100% on 2 L of oxygen, heart rate is 109, respiratory  rate is 17;  initially when she came in it was 25.  GENERAL:  This is a morbidly obese African American female lying in bed  in no acute distress.  HEENT:  Unremarkable.  Pupils are equal, round and reactive to light and  accommodation.  NECK:  Thick neck, unable to appreciate any JVD or thyromegaly.  HEART:  Regular rate and rhythm.  No murmurs, gallops or rubs.  Distant  heart sounds.  LUNGS:  No labored breathing at this time.  No pleuritic chest pain to  deep inspiration.  I did not appreciate any crackles or wheezing.  ABDOMEN:  Protuberant, soft.  Positive bowel sounds.  SKIN:  Several ulcerations, has 2 ulcerations, 1 is under pannus and one  is on her right thigh area.  They do show some granulation at the bases  bilaterally, but definitely not healed and do not appear to be infected  at this time.   LABORATORY DATA:  CBC showed a white count of 12.3, hemoglobin of 10.8,  platelet count of 271,000.  BMET shows sodium of 138, potassium 4.2,  chloride 106, CO2 23, glucose 172, BUN 8, creatinine 0.73, calcium of  9.1.   Cardiac markers were myoglobin 132, MB of 2.3, troponin I of 0.15.  On recheck, CK level was 37, MB of 1.4, troponin I of 0.17.  D-dimer was  elevated of 4.61.  Last cardiac markers showed myoglobin 85.4, MB of  3.2, troponin I of 0.11.  Blood cultures x2 are pending.  BNP was 32.5.   CT angiogram of chest shows multiple large bilateral pulmonary emboli  including a saddle embolus, also some fatty infiltration of the liver.   IMPRESSION:  This is a 60 year old morbidly obese Afro-American female  who presented with new onset of pulmonary emboli of unknown specific  cause other than sedentary lifestyle.   ASSESSMENT AND PLAN:  1. Pulmonary emboli/saddle emboli.  She has a significant pulmonary      emboli on CT scan and the patient was initially given a dose of      Lovenox in the emergency department, but we will start her on      heparin full dose at this time, given her of morbid obesity and the      degree of this blood clot.  I did not assess her lower extremities,      but she has very morbidly obese lower extremities with firmness in      both legs, unable to tell whether either/or could have blood clots      in them; I did not changed therapy at this time.  We will start her      on morphine for pain control if need be.  2. Diabetes mellitus type 2.  We will start with sliding-scale      insulin.  Since she just had dye, definitely will not be getting      metformin and given the sporadic nature of her blood sugars, we      will hold on the Glucotrol as well.  3. Hypertension.  We will continue on the perhaps and trandolapril.  4. Pannus pressure ulcers, seen and assessed by Dr. Leticia Penna and has      had multiple debridements before.  We will reconsult Dr. Leticia Penna      for any further wound management.  She does have a slight      leukocytosis and she 2 blood cultures are pending.  The patient      denies any  specific fever.  5. Anemia.  She does have anemia which is chronic in  nature, in      looking back at her previous laboratory work.  She has had      gastrointestinal evaluation before back in 2003, with unknown      exact; there was single prominent      anal papilla.  This was performed by Dr. Jena Gauss.  We will get an      anemia panel on her as well, as well as guaiac her stools, since      she will be started on heparin.  She has had no signs of active      bleeding at this time.  She will be followed up by Dr. Juanetta Gosling in      the morning for any further input.      Catalina Pizza, M.D.  Electronically Signed     ZH/MEDQ  D:  03/18/2007  T:  03/18/2007  Job:  [id number]

## 2010-07-05 NOTE — Assessment & Plan Note (Signed)
[facility]   NAME:  [name], [name]                 ACCOUNT NO.:  [id number]   MEDICAL RECORD NO.:  [id number]      DATE OF BIRTH:  August 27, 1950   PHYSICIAN:  Theresia Majors. Tanda Rockers, M.D. VISIT DATE:  08/06/2007                                   OFFICE VISIT   SUBJECTIVE:  [name] is a 60 year old female with ulcers related to  fatty necrosis involving the medial aspect of the right thigh.  In the  interim, she has continued to have daily antibacterial soap washes and  packings with 1/2-inch plain Nu-Gauze.  There has been no excessive  drainage, pain, or fever.   OBJECTIVE:  Blood pressure 153/84, respirations 20, pulse rate 83, and  temperature is 98.  She is accompanied by her husband.   The patient is complaining of constant pain in area of the ulceration.  Inspection of the medial thigh on the right shows that wound #1 has  completely re-epithelialized and wound #2 is residual and has a diameter  of approximately 5 cm in a circular configuration.  There is a sinus  tract at 11 o'clock that extends 2 cm.  There is a yellowish drainage.  There is associated local warmth to the wound, but there is no  fluctuance.  There is no maceration.   ASSESSMENT:  Improved ulcerations secondary to fatty necrosis.   PLAN:  We will increase the frequency of packings of the residual wound  #2 to utilize a moist technique with daily.  We will ask the Home Health  nurse or try in a family member to proceed.   If there is continued difficulty in getting this wound to heal, we will  recommend a plastic surgical consultation for consideration of a  reduction panniculectomy of her thigh.      Harold A. Tanda Rockers, M.D.  Electronically Signed     HAN/MEDQ  D:  08/06/2007  T:  08/07/2007  Job:  [id number]

## 2010-07-05 NOTE — Group Therapy Note (Signed)
NAME:  [name], [name]                 ACCOUNT NO.:  [id number]   MEDICAL RECORD NO.:  [id number]          PATIENT TYPE:  INP   LOCATION:  A206                          FACILITY:  [facility]   PHYSICIAN:  Edward L. Juanetta Gosling, M.D.DATE OF BIRTH:  [birth year]   DATE OF PROCEDURE:  DATE OF DISCHARGE:                                 PROGRESS NOTE   [name] is better but she has swelling of her right arm.  This is the  arm that has a PICC line.  I am concerned that perhaps is leaking.  Otherwise she is doing about the same.   Her temperature 98.6, pulse 91, respirations 20, blood pressure 151/84,  O2 sats 99%.  Blood sugar 109.   ASSESSMENT:  She has pulmonary embolus which is doing okay.  Her  prothrombin time now is 22.9, INR 1.9, so she is getting therapeutically  anticoagulated.  She has a leg wound that is being treated by Dr.  Suzette Battiest.  She has diabetes which has been treated, and she has a leg  ulcer that is overall I think a better.  She has morbid obesity which is  unchanged.  She has a swelling of the arm.  We are working on that.  I  am going to plan to continue with treatments and medications, and I  would continue with Dr. Illene Regulus help.  She is getting close to being  therapeutically anticoagulated.  She needs to have her PICC line  replaced.      Edward L. Juanetta Gosling, M.D.  Electronically Signed     ELH/MEDQ  D:  03/20/2007  T:  03/20/2007  Job:  [id number]

## 2010-07-05 NOTE — Discharge Summary (Signed)
NAME:  Lepkowski, Tambi                 ACCOUNT NO.:  [id number]   MEDICAL RECORD NO.:  [id number]          PATIENT TYPE:  REC   LOCATION:  FOOT                         FACILITY:  [facility]   PHYSICIAN:  Lenon Curt. Chilton Si, M.D.  DATE OF BIRTH:  Jun 02, 1950   DATE OF ADMISSION:  11/08/2007  DATE OF DISCHARGE:                               DISCHARGE SUMMARY   [facility]   HISTORY:  A 60 year old female returns to the wound care clinic for  reinspection of her wounds.  She was last seen on October 25, 2007.  She has been using Silvadene cream and absorbent pads for the drainage  from wounds in the left anterior thigh, abdomen, and right proximal  medial upper leg area.  These wounds are a bit cleaner than they were  last week but still continue to have a lot of drainage and have gray  material at the base of the wounds that is nonviable.  There is bleeding  easily with attempts to debride these areas.  We were able to do some  debridement, as will be described below.  The wounds are somewhat  painful.  According to the patient, she has been taking care of them  regularly, as requested.   PHYSICAL EXAMINATION:  VITAL SIGNS:  Temp 97.9, pulse 82, respirations  16, blood pressure 151/81.  The wounds were remeasured today and are approximately the same size as  previously noted.  The area of the left upper thigh shows devitalized  tissue deep into the wound.  The wounds are substantially deep, being at  least 2 cm on the leg wounds.  The one on the lower abdomen is much  shallower, being only 0.2 to 0.3 cm.   Because of the presence of nonviable tissue in the bases of the wounds  and slough, we attempted sharp debridement.When there was immediate  bleeding, it was elected not to pursue this.  We therefore used gauze  material to wipe out the slough that was present in the base of the  wound, so it was actually quite clean-looking by the time this procedure  was completed.   The  wounds are deep enough that it is important to rule out infection;  therefore, a tissue culture was obtained.   I think this patient may benefit from surgical intervention with a deep  incision and removal of the wounds with primary closure; however, not  being a surgeon, I hesitate to say that this is mandatory, or even  ideal.  I have requested the patient be referred to a plastic surgeon  for their opinion.  There are issues of insurance coverage, or to be  more specific, noncoverage that may impede our ability to refer her to  the surgeon.  Patient is asked to apply Santyl to further debride  devitalized tissue, then pack with gauze, and return to this clinic in  two weeks.      Lenon Curt Chilton Si, M.D.  Electronically Signed     AGG/MEDQ  D:  11/08/2007  T:  11/09/2007  Job:  [id number]

## 2010-07-05 NOTE — Assessment & Plan Note (Signed)
[facility]   NAME:  [name], [name]                 ACCOUNT NO.:  [id number]   MEDICAL RECORD NO.:  [id number]      DATE OF BIRTH:  [birth year]   PHYSICIAN:  Theresia Majors. Tanda Rockers, M.D. 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  In the interim, she  has had visiting home nurse irrigating the wound and packing the wound  with moist-to-moist dressing.  The patient has been discharged from the  [facility] due to a change in her findings.  There has been no  interim fever or malodor.   OBJECTIVE:  VITAL SIGNS:  Blood pressure is 146/76, respirations are  normal, pulse rate 70, temperature is 97.9, and capillary blood glucose  is 81.  EXTREMITIES:  Inspection of the right medial thigh shows a prominent  peniculus with a central area of necrosis corresponding to the  previously identified wound #2.  The wound itself remains open with a  diameter approximately 5.5 cm.  There is no extreme necrosis or  drainage.  There is marked edema in both the thighs with obvious peau  d'orange changes.  The cavity has no undermining at this point.  There  is no malodor.  No debridement is needed. The pedal pulse remains  palpable.   ASSESSMENT:  Fatty necrosis improvement.   PLAN:  We have instructed the patient to continue daily irrigations with  antibacterial soap irrigations.  She can continue to apply moist-to-  moist dressing daily.  We will reevaluate her in 2 weeks p.r.n.      Harold A. Tanda Rockers, M.D.  Electronically Signed     HAN/MEDQ  D:  08/20/2007  T:  08/21/2007  Job:  [id number]

## 2010-07-05 NOTE — Assessment & Plan Note (Signed)
[facility]   NAME:  [name], [name] NO.:  [id number]   MEDICAL RECORD NO.:  [id number]      DATE OF BIRTH:  Mar 08, 1950   PHYSICIAN:  Maxwell Caul, M.D. VISIT DATE:  10/09/2007                                   OFFICE VISIT   LOCATION:  [facility].   [name] is a lady we have been following here for a wound on the large  part of her pannus on the right anterior thigh.  This was associated  with stasis and lymphedema.  Over the last month, she has developed 2  additional wounds, one on the left anterior thigh and more recently  since her last visit, a wound on her lower abdominal area in the middle  of her hysterectomy scar.  All of these wounds have similar  characteristics including a very hard subcutaneous tissue around the  wound suggestive of some degree of inflammation, but no evidence of  active infection.  When she was here last time, I simply used a moist-to-  moist dressing on the right leg and antibacterial soap and Polysporin to  the left leg.  She returns today in followup.   On examination, temperature is 98, pulse 81, respirations 20, and blood  pressure 141/89.  Unfortunately, there is a new wound on the lower  abdominal hysterectomy scar.  All of these have the same characteristics  of spontaneous opening and firm tissue around the wound very similar to  the one on her left anterior thigh.  The area on the right proximal  pannus now measures 4.5 x 3.5 x 1.2, it has tunneling of 1.5 cm at 12  o'clock.  This wound underwent a gentle debridement of mild amount of  slough on top of the wound.  The base of this actually looks fairly  clean.  The wound on the left anterior thigh and new wound on her  abdomen had necrotic tissue that was manually removed with EMLA per  Anesthesia and a #15 blade.  Hemostasis with direct pressure.   IMPRESSION:  Deteriorating and new wounds in the setting of probable fat  necrosis.  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  She does  not have systemic symptoms.  I went ahead and biopsied the wound on the  left anterior thigh at the superior edge of this including part of the  ulcer, some normal skin, and the firm material that I can feel under the  skin.  I have done this to more firmly establish the diagnosis here.  Ultimately, I am thinking that I may need to seek dermatology  consultation.  As mentioned, she does not appear to be systemically ill;  however the next time she is here, I will probably do blood work  including a screen for rheumatologic diseases.  Unfortunately, nothing I  am doing currently seems to be particularly helpful; however, we will  need to do some more research about the usefulness of anti-  inflammatories, etc.           ______________________________  Maxwell Caul, M.D.     MGR/MEDQ  D:  10/09/2007  T:  10/10/2007  Job:  [id number]

## 2010-07-05 NOTE — Group Therapy Note (Signed)
NAME:  [name], [name]                 ACCOUNT NO.:  [id number]   MEDICAL RECORD NO.:  [id number]          PATIENT TYPE:  INP   LOCATION:  IC08                          FACILITY:  [facility]   PHYSICIAN:  Edward L. Juanetta Gosling, M.D.DATE OF BIRTH:  September 30, 1950   DATE OF PROCEDURE:  03/19/2007  DATE OF DISCHARGE:                                 PROGRESS NOTE   PROBLEM:  1. Pulmonary embolism.  2. Diabetes.  3. Previously infected thigh wound.   SUBJECTIVE:  [name] says she is feeling okay.  She is less short of  breath.  She has no other new complaints.   PHYSICAL EXAMINATION:  Shows that she is awake and alert.  Her pulse is  76, blood pressure 156/57, respirations 17, O2 sats 100%.  CHEST:  Fairly clear.  HEART:  Regular.   Her ProTime 16.9 with an INR of 1.3.  CBC:  White count 9500, hemoglobin  is 9.5, platelets 216 and her BMET shows potassium is 3.3, BUN of 4,  creatinine 0.58, A1c is 5.9.   ASSESSMENT:  She is better I think.   PLAN:  I am going to replace her potassium, am going to get her up out  of the chair.  Attempting to reach Dr. Leticia Penna now to let him know that  she is here and see what he wants to do with her wounds.      Edward L. Juanetta Gosling, M.D.  Electronically Signed     ELH/MEDQ  D:  03/19/2007  T:  03/19/2007  Job:  [id number]

## 2010-07-05 NOTE — Group Therapy Note (Signed)
NAME:  [name], [name]                 ACCOUNT NO.:  [id number]   MEDICAL RECORD NO.:  [id number]          PATIENT TYPE:  INP   LOCATION:  IC08                          FACILITY:  [facility]   PHYSICIAN:  Edward L. Juanetta Gosling, M.D.DATE OF BIRTH:  16-Jun-1950   DATE OF PROCEDURE:  03/18/2007  DATE OF DISCHARGE:                                 PROGRESS NOTE   [name] was admitted because of pulmonary embolus.  She says she is  breathing better.  She was admitted early this morning.  She feels  better, she has no new complaints.   PHYSICAL EXAMINATION:  Shows that she is an obese female who is in no  acute distress.  She is complaining of pain in her leg where she had had  surgery.  CHEST:  Pretty clear.  Her O2 sat in the high 90s, heart rate is 102, respirations about 20,  blood pressure 100/70.  Her cardiac panel shows troponin of 0.37,  otherwise normal.  BMET normal with a glucose of 116.  She is anemic,  anemia panel she has 29% reticulocytes suggesting that some of this is  from an acute episode.  CBC shows hemoglobin is 10.3, white count is  11,600.   ASSESSMENT:  She has pulmonary emboli.  She has diabetes.  She has a  wound on her leg.  We are going to ask Dr. Leticia Penna to help with the  wound on the leg.  We will continue with all of her other treatments and  follow.      Edward L. Juanetta Gosling, M.D.  Electronically Signed     ELH/MEDQ  D:  03/18/2007  T:  03/18/2007  Job:  [id number]

## 2010-07-05 NOTE — Assessment & Plan Note (Signed)
[facility]   NAME:  [name], [name] NO.:  [id number]   MEDICAL RECORD NO.:  [id number]      DATE OF BIRTH:  1950-06-18   PHYSICIAN:  Maxwell Caul, M.D. VISIT DATE:  09/04/2007                                   OFFICE VISIT   [name] is a 60 year old lady we have followed for a fatty necrosis on  the medial aspect of her right thigh at the inferior part of a large  pannus.  In the interim, she has been using daily antiseptic soap washes  with moist-to-moist dressings.  I believe her husband is actually doing  the dressings at home.  In the interim, she has developed a smaller  wound on the left anterior thigh which we have looked that today as  well.   On examination, she is afebrile.  There is a large wound on the inferior  part of her large pannus of her right leg.  This had a large amount of  necrosis and adherent eschar in it.  This underwent debridement.  We  used EMLA for anesthesia and #10 blade.  Hemostasis was with direct  pressure.  On the left anterior leg, there was a small almost skin tear  looking wound.  This had a large amount of firm tissue underneath and  involving tissue in the periwound area.  This was not tender.  It did  not appear to be infected.   IMPRESSION:  Wounds secondary to fat necrosis.  I wondered whether this  woman might have an inflammatory panniculitis based on the wound on her  left anterior thigh.  I am not sure if she has had a skin biopsy in the  past.  I know she was hospitalized at [facility] some months ago, I will  look into this.  The wound in the right side simply looks like a  dependent ulcer in the middle setting of venous stasis and lymphedema.  I would continue the daily antiseptic soap washes, moist-to-moist  dressings, and I did try to support this with an Ace wrap, I will see if  this helps to control the edema.   With regards to the small wound on the left anterior thigh, we  will  simply clean this with antibacterial soap and apply a topical  antibiotic.  I am worried that there may be some further breakdown here  based on the firm induration around this.  The patient herself mentioned  Coumadin necrosis, although in my limit experience with this, this is  not what this looks like.  We will see her again in a month's time.           ______________________________  Maxwell Caul, M.D.     MGR/MEDQ  D:  09/04/2007  T:  09/05/2007  Job:  [id number]

## 2010-07-05 NOTE — Assessment & Plan Note (Signed)
[facility]   NAME:  [name], [name]                 ACCOUNT NO.:  [id number]   MEDICAL RECORD NO.:  [id number]      DATE OF BIRTH:  [birth year]   PHYSICIAN:  Lenon Curt. Chilton Si, M.D.   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  Last seen by me November 08, 2007.  Last seen in the clinic by Dr.  Leanord Hawking on November 22, 2007.  Recent wound therapy has included Silvadene-  soaked gauze to the wounds twice weekly.  Husband has been assisting her  with this at home.   During the last month, the wounds have really not changed much at all.  There continues to be some discomfort with manipulation of the wounds.  There is fluid drainage from the wounds.  There is no foul odor to the  drainage, and the patient has not been running any fever. The likelihood  of infection seems quite small.  Wound culture done November 08, 2007,  did disclose Proteus mirabilis, she was treated with Augmentin per  sensitivity reports for this and tolerated the treatment well.  She has  been off this antibiotic for several days.  Wound cultures from the  thigh on the right and left side were obtained and the same organism was  found.   PHYSICAL EXAMINATION:  VITAL SIGNS:  Temperature 98.2, pulse 77,  respirations 20, and blood pressure 137/84.  Wound of the right proximal  medial thigh label #2 measures 3.9 x 4 x 1.5 cm, wound #4 at the left  anterior thigh measures 3.5 x 3.4 x 1.4 cm and wound #5 at the  infraumbilical area of the abdomen measures 3 x 2.5 x 0.1 cm and is much  more superficial than the wounds on the thighs.  GENERAL:  The patient is obese.  She has edema in her lower abdomen and  thighs.  She has no evidence of congestive heart failure.  LUNGS:  Clear.   I suspect that this is a form of lymphedema of  uncertain etiology.   PLAN:  The  patient has been tried on multiple forms of treatment in the  past including Dakin's solution by Dr. Janyth Pupa, daily antiseptic soap  washes following that, and zinc oxide in May with Iodoform packing,  Santyl and packing with gauze in September followed by Silvadene cream.   The wound care today was to continue Silvadene-impregnated gauze and  recommended that it be done twice daily.  All wounds were treated in  this manner.   The patient was again advised that I thought a surgical consultation  might be useful subsequent to her visit in the clinic, I talked with Dr.  Beverly Sessions, her plastic surgery in [facility], he is willing to  receive information regarding this patient and make a decision regarding  whether or not she should be set up with a visit to his office.     Lenon Curt Chilton Si, M.D.  Electronically Signed    AGG/MEDQ  D:  12/06/2007  T:  12/07/2007  Job:  [id number]

## 2010-07-05 NOTE — H&P (Signed)
NAME:  [name], [name]                 ACCOUNT NO.:  [id number]   MEDICAL RECORD NO.:  [id number]          PATIENT TYPE:  AMB   LOCATION:  DAY                           FACILITY:  [facility]   PHYSICIAN:  Tilford Pillar, MD      DATE OF BIRTH:  07-Dec-1950   DATE OF ADMISSION:  DATE OF DISCHARGE:  LH                              HISTORY & PHYSICAL   CHIEF COMPLAINT:  Right lateral posterior thigh ulceration.   HISTORY OF PRESENT ILLNESS:  Patient is a 60 year old female with morbid  obesity who I have been following extensively as an outpatient in my  office.  She had a prior debridement of this right posterior thigh wound  with debridement of necrotic tissue and minor procedure at [facility].  Following that, wet-to-dry dressings were administered until  a formal wound vac was able to be administered.  She has been undergoing  negative pressure wound vac therapy for the last week and a half.  On  her last evaluation, she was noted to have mild necrotic tissue within  the wound.  This has increased in extensiveness within the wound  overlying the granulation tissue around the edges of the wound.  She has  stated minimal drainage, which has been controlled with the wound vac.  She has had no fevers or chills.  She does have pain around the area,  which has been persistent, but has not increased in intensity or in its  pattern.   PAST MEDICAL HISTORY:  1. Osteoarthritis.  2. Diabetes mellitus type 2, non-insulin-dependent.  3. Morbid obesity.  4. Hypertension.   PAST SURGICAL HISTORY:  1. Hysterectomy.  2. Prior debridement of the wound.   Medications were reviewed with the patient, including Actos, Celebrex,  glipizide, Metformin, verapamil, furosemide, fluconazole, as well as  Percocet for pain control.   REVIEW OF SYSTEMS:  CONSTITUTIONAL:  Occasional chills.  EYES:  Unremarkable.  EARS, NOSE, AND THROAT:  Sinusitis.  RESPIRATORY:  Unremarkable.  CARDIOVASCULAR:   Unremarkable.  GASTROINTESTINAL:  Unremarkable.  GENITOURINARY:  Urinary retention.  MUSCULOSKELETAL:  Arthralgias, weakness.  SKIN:  As per HPI.  ENDOCRINE:  Unremarkable.  NEURO: Unremarkable.   PHYSICAL EXAMINATION:  Patient is a morbidly obese female.  She is in no  acute distress.  She is alert and oriented.  HEENT:  Scalp with no deformities, no masses.  Pupils are equal, round  and reactive to light.  Extraocular movements are intact.  No  conjunctival pallor is noted.  Oral mucosa is pink and moist.  NECK:  Trachea is midline.  No cervical lymphadenopathy.  PULMONARY:  Unlabored respirations.  Clear to auscultation.  CARDIOVASCULAR:  Regular rate and rhythm.  EXTREMITIES:  Warm and dry.  She does have a right lateral posterior  thigh ulceration measuring approximately 2.5 x 2.5 x 2 cm deep.  There  is granulation tissue along the circumference of the wound; however,  there was central necrotic sloughing tissue.  She has moderate  tenderness around as well as induration around this area.  No  significant secretions are noted.  There is a foul odor to the wound  upon removal of the wound vac.  Additionally, she has a posterior right  leg superficial ulceration just inferior to her knee.  This has a slight  amount of necrotic tissue centrally; however, there is no significant  induration around the area.  Due to the superficial nature, no suspected  deep abscess or necrosis is suspected.  This measures approximately 2 cm  x 1 cm, and again, this is extremely superficial.   ASSESSMENT/PLAN:  1. Right lateral thigh posterior ulceration:  Due to the presence of      the necrotic tissue, continued wound vac therapy is not likely to      definitively treat the wound.  This will require additional      debridement, as the patient had poorly tolerated the last      debridement under local anesthetic and mild sedation, it      recommended at this time that this debridement be done in  the      operating room under heavy sedation versus the general anesthetic.      This will be left to the patient and the anesthesiologist.      Additionally, I recommended that she continue with the wound vac      therapy at this time as well as the dressing changes to the      posterior leg ulceration.  At this point, I do not think that the      inferior ulceration will require any formal debridement in the      operating room, but this will be inspected at the time of her      operation to confirm.  I do think that extensive debridement of the      surrounding tissue will be required, as there is a significant      amount of induration.  This will be taken down to healthy      subcuticular tissue.  This will be planned for January 28, 2007.      Tilford Pillar, MD  Electronically Signed     BZ/MEDQ  D:  01/24/2007  T:  01/24/2007  Job:  [id number]   cc:   [facility]  Fax: [phone]

## 2010-07-05 NOTE — H&P (Signed)
NAME:  [name], [name]                 ACCOUNT NO.:  [id number]   MEDICAL RECORD NO.:  [id number]          PATIENT TYPE:  INP   LOCATION:  A316                          FACILITY:  [facility]   PHYSICIAN:  Tilford Pillar, MD      DATE OF BIRTH:  05-21-1950   DATE OF ADMISSION:  02/07/2007  DATE OF DISCHARGE:  LH                              HISTORY & PHYSICAL   CHIEF COMPLAINT:  Continued pain and worsening odor of wounds.   HISTORY OF PRESENT ILLNESS:  The patient is an unfortunate 60 year old  African American female well known to me, with a history of diabetes  mellitus and morbid obesity, who has had an ongoing chronic wound of her  right medial posterior thigh and has also developed a right posterior  leg wound/ulceration.  She has undergone multiple debridements of this  wound and continued aggressive wound therapy with home nursing.  However, over the last several days she has noticed increasing odor to  the wounds and continued pain around both wounds.  She denies any fever  or chills.  She has had no nausea or vomiting.  She has had a wound VAC  over this area and has noticed no significant increase in the amount of  drainage in her wound VAC canister.   PAST MEDICAL HISTORY:  1. Hypertension.  2. Diabetes mellitus type 2.  3. Morbid obesity.   RECENT PAST SURGICAL HISTORY:  Wound debridement x2 in the operating  room.   MEDICATIONS:  Reviewed with patient, including:  1. Actos for her diabetes.  2. Celebrex for joint arthralgias.  3. She is also on hypertensive medications.   ALLERGIES:  No known drug allergies.   SOCIAL HISTORY:  No tobacco use, no alcohol use, no recreational drug  use.   PHYSICAL EXAMINATION:  GENERAL:  The patient is a morbidly obese African  American female in mild to moderate discomfort.  Even with wound VAC in  place, there is an odor noted of necrotic tissue.  HEENT:  Pupils equal, round, and reactive.  Extraocular movements are  intact.  No  conjunctival pallor is noted.  Oral mucosa is pink.  Normal  oral occlusion.  NECK:  Trachea is midline.  PULMONARY:  Respirations are unlabored.  She is bilaterally clear to  auscultation.  CARDIOVASCULAR:  Regular rate and rhythm.  ABDOMEN:  Soft, obese, nondistended, nontender.  EXTREMITIES:  She has significant morbid obesity, with significant soft  tissue in both lower extremities.  Wound VAC was removed in the office,  and there was noted to be a continued approximately 3 x 3 cm ulceration,  approximately 1-1.5 cm in depth.  There is necrotic sloughing tissue  within the wound.  There is minimal granulation tissue.  Minimal  bleeding is obtained with rough debridement with a dry gauze 4 x 4.  She  is exquisitely tender around the area as well as the wound bed.  The  right posterior leg wound has similar necrotic sloughing tissue within  the wound bed.  Minimal granulation tissue is also encountered.  This is  also  exquisitely tender to palpation.  Its dimensions are approximately  5 cm x 2 cm x 1 cm in depth.   ASSESSMENT AND PLAN:  Complicated infected wounds x2.   As the patient continues to have poor wound healing with current  management, which has been relatively corrective utilizing the wound VAC  negative pressure system, plans will be made for admitting as an  inpatient for initiation of IV antibiotics and daily wound VAC changes.  Additionally, secondarily, for the patient's significant pain, we will  also begin pain control as an inpatient and close diabetic monitoring.  At this time, no plans for incisional debridement will be undertaken.  However, pending continued wound care, addressed with local wound care,  additional debridement may be necessary.  She has had a recent  debridement approximately 2 weeks ago with exposure down to healthy-  appearing adipose tissue, and with oozing at that time.  It was  discussed with the patient that her slow wound healing is  attributed to  her morbid obesity as well as her diabetes mellitus, and that continued  aggressive wound care will be needed, with hopeful closure.  If the  patient continues to have poor wound closure, she may need referral to a  wound center for long-term wound management and possibly hyperbaric  oxygen therapy.  However, as aggressive inpatient antibiotic therapy and  aggressive wound care has not been initiated yet, we will initiate this  at this time.      Tilford Pillar, MD  Electronically Signed     BZ/MEDQ  D:  02/07/2007  T:  02/08/2007  Job:  [id number]   cc:   Ramon Dredge L. Juanetta Gosling, M.D.  Fax: [phone]

## 2010-07-05 NOTE — Assessment & Plan Note (Signed)
[facility]   NAME:  [name] [name] [name]                 ACCOUNT NO.:  [id number]   MEDICAL RECORD NO.:  [id number]      DATE OF BIRTH:  1950-07-25   PHYSICIAN:  Theresia Majors. Tanda Rockers, M.D. 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  In the interim, she has continued to use antibacterial  soap and irrigation with an Iodoform packing in the undermining of the  proximal wound.  There has been no interim fever.  There has been  moderate drainage.  The patient is minimally ambulatory.   OBJECTIVE:  VITAL SIGNS:  Blood pressure is 136/92, respirations 20,  pulse rate 91, and temperature 98.4.  Capillary blood glucose is 67 mg%.  The patient is accompanied by her husband.  GENERAL:  The patient is alert, oriented, in good contact with reality.  EXTREMITIES:  Examination of the lower extremities shows that wound  number 1 in the medial distal right thigh has contracted significantly  and it is 90% reepithelialized.  There is no evidence of active  infection, drainage, or malodor.  Wound number 2 in the proximal medial  thigh measures 5 cm in transverse diameter.  There is significant  undermining at 4-5 cm between 1 and 3 o'clock.  There is healthy  granulation.  There is evidence of contraction and reepithelialization  from the periphery.   ASSESSMENT:  Clinical improvement of fatty necrosis ulcers.   PLAN:  The patient will continue daily antiseptic soap washes and  packing with plain 1/2-inch Nugauze on wound number 2.  We will  reevaluate the patient in two weeks p.r.n.      Harold A. Tanda Rockers, M.D.  Electronically Signed     HAN/MEDQ  D:  07/23/2007  T:  07/24/2007  Job:  [id number]

## 2010-07-05 NOTE — H&P (Signed)
NAME:  [name], [name]                 ACCOUNT NO.:  [id number]   MEDICAL RECORD NO.:  [id number]          PATIENT TYPE:  OBV   LOCATION:  A304                          FACILITY:  [facility]   PHYSICIAN:  [name] G. Renard Matter, MD   DATE OF BIRTH:  10-26-1950   DATE OF ADMISSION:  04/07/2007  DATE OF DISCHARGE:  LH                              HISTORY & PHYSICAL   This patient presented to the ED with low blood sugar.  Apparently the  sugar was in the range of 40 prior to admission.  She was given D50 in  the ED and her sugars normalized.  She does have a significant history  of diabetes, hypertension and morbid obesity.   Her lab data in the emergency room showed:  Sodium 138, potassium 4.3,  chloride 105, CO2 28, glucose 175, BUN 4, creatinine 0.60.  CBC:  WBC  7800, hemoglobin 10.3, hematocrit 32.   It was felt by the ED physician that she should be hospitalized, at  least on observation to monitor her blood sugars.   SOCIAL HISTORY:  The patient does not smoke or drink alcohol.   PAST MEDICAL HISTORY:  Prior history of:  1. Hypertension.  2. Diabetes.  3. Arthritis.  4. Obesity.   PAST SURGICAL HISTORY:  Wound debridement previously.   ALLERGIES:  NO KNOWN ALLERGIES.   MEDICATION LIST:  1. Actos 30 mg daily.  2. Fluconazole 100 mg daily.  3. Furosemide 40 mg daily.  4. Glipizide Metformin 5/500 b.i.d.  5. Oxycodone 5/325 p.r.n.  6. Tramadol 37.5/325 four times daily.  7. Verapamil 240 mg daily.  8. Coumadin daily.  9. Trandolapril 4 mg daily.   REVIEW OF SYSTEMS:  HEENT:  Negative.  CARDIOPULMONARY:  No cough,  hemoptysis or dyspnea.  GI:  No bowel irregularity or bleeding.  GU:  No  dysuria or hematuria.   PHYSICAL EXAMINATION:  Alert female.  Blood pressure 150/79,  respirations 20, pulse 81, temperature 99.  The patient is morbidly  obese.  HEENT:  Eyes PERRLA.  TMs negative.  Oropharynx benign.  NECK:  Supple.  No JVD or thyroid abnormalities.  BREASTS:  Normal.  No  masses.  HEART:  Regular rhythm; no murmurs.  LUNGS:  Clear to percussion and auscultation.  ABDOMEN:  No palpable organs or masses.  EXTREMITIES:  Free of edema.   DIAGNOSES:  1. Diabetes mellitus with hypoglycemia.  2. Prior history of hypertension.  3. Obesity.  4. Arthritis.      [name] G. Renard Matter, MD  Electronically Signed     AGM/MEDQ  D:  04/07/2007  T:  04/07/2007  Job:  [id number]

## 2010-07-05 NOTE — Assessment & Plan Note (Signed)
[facility]   NAME:  [name], [name]                 ACCOUNT NO.:  [id number]   MEDICAL RECORD NO.:  [id number]      DATE OF BIRTH:  Apr 02, 1950   PHYSICIAN:  Jake Shark A. Tanda Rockers, M.D. VISIT DATE:  04/09/2007                                   OFFICE VISIT   REASON FOR CONSULTATION:  Ms. Pizano is a 60 year old diabetic referred by  Dr. Tilford Pillar of [place] for evaluation of ulcerations involving  the medial posterior thigh.   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   RECOMMENDATIONS:  The wounds were debrided and irrigated in the Wound  Center.  The patient was given instruction on local care including  antiseptic soap wash and dry dressing.  We will re-evaluate her in 2  weeks.  She should continue to shower b.i.d. with an antiseptic soap  wash.  We have encouraged her to pursue marked management of exogenous  obesity as the centerpiece of prophylaxis and management of these areas  of fatty necrosis.   SUBJECTIVE:  Ms. Bergin is a 60 year old diabetic for the past 18-20  years.  Over the last several months she has noted recurrent breakdown  is in the medial aspect of her thighs related to bulbous adipose tissue.  She has been under the care of Dr. Tilford Pillar who has debrided her  and instituted wet-to-dry dressings.  She has also been seen by the  visiting home health nurse for the same.  She has had a course of  hydrotherapy at [facility].  All of these efforts have resulted in clean  wounds, but these areas continue to be problematic.  She has had a  course of antibiotics.  There has been no fever and she has had mild to  moderate pain.   PAST MEDICAL HISTORY:  Remarkable for her diabetes.  Her exogenous  morbid obesity, hypertension, anemia, osteoarthritis and she has a  recent history of bilateral pulmonary emboli.   CURRENT MEDICATION:  List includes doxycycline 100 mg b.i.d., Actos 200  mg daily, Celebrex 2 mg b.i.d., fluconazole,  Lasix 40 mg daily,  glyburide, metformin 500 mg b.i.d. Tylox 1 every 6 ounces p.r.n. for  pain.  Verapamil 240 mg daily and Coumadin as directed per Dr. Juanetta Gosling.   PAST SURGERY:  Has included a hysterectomy and tubal ligation.   FAMILY HISTORY:  Positive for coronary disease, diabetes, stroke and  cancer.   SOCIALLY:  She is married.  She has children.  She lives in [place].  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   REVIEW OF SYSTEMS:  She is markedly incapacitated due to the pain  attributed to degenerative joint disease.  She does not take stairs.  She is unable to walk a block without significant assistance.  She has  never smoked.  She has lost 25 pounds over the last year.  She denies  transient visual changes or paralysis or other stigmata of TIAs.  She  denies hemoptysis.  She has occasional shortness of breath.  She has  noted some palpitations and she denies syncope or near-syncopal  episodes.  She specifically denies chest pain.  Her abdomen, she denies  bowel or bladder dysfunction.  The joint  complaints are listed above.  The remainder of the review of systems is negative.   PHYSICAL EXAM:  She is alert, oriented female in good contact with  reality responding appropriately to inquiry.  She weighs 312 pounds, is 5 feet 6 inches tall.  HEENT exam is clear.  Neck is supple.  Trachea is midline.  Thyroid is nonpalpable.  Lungs were clear.  Heart sounds were distant.  Abdomen is soft.  Extremity exam is remarkable for a bulbous portions of the thighs  abutting medially with induration and moisture.  The pedal pulses are 3+  bilaterally.  The patient has preservation of sensation to the [facility]  filament.  On the medial aspect of the bulbous portion of the adipose  protrusion on the right thigh is a well-formed ulcer measuring 5.2 cm in  diameter with a 1 cm undermining between 12 and 2 o'clock.  On the  inferior portion of the same thigh, is a  granulating wound measured 3.7  centimeters x 5.6 cm with a 0.4 cm depth.  Neither wound appears to be  infected.  Wound #2 contains a moderate amount of frank fatty necrosis  which was removed with rongeurs and forceps following adequate  anesthesia using a static mixed mixture of anesthetic agents.  There are  areas on the right posterior thigh which will represent induration but  these are not full-thickness.  They are moderately discomforting.  There  is no fluctuance and no underlying abscess.   DISCUSSION:  Ms. Sanon is a 60 year old diabetic with symptomatic  exogenous obesity leading to at least 3 major complications, i.e. the  fatty necrosis, diabetes, and degenerative joint disease.  Her current  wound is directly related to her obesity.  We have adequately debrided  this wound and encouraged local hygiene.  We have expressed a  willingness to monitor this wound and to perform ongoing wound  management.  At the same time, we strongly encouraged the patient to  discuss the modern management of exogenous obesity with her primary care  physician.  We we have emphasized that the prognosis of achieving a  sustainable closure of these wounds is directly related to her weight  control.  We have given the patient an opportunity to ask questions.  She seems to understand all of the discussions and treatment plans.  She  expresses gratitude for having been seen in the clinic and indicates  that she will be compliant.      Harold A. Tanda Rockers, M.D.  Electronically Signed     HAN/MEDQ  D:  04/09/2007  T:  04/10/2007  Job:  [id number]   cc:   Tilford Pillar, MD  Oneal Deputy. Juanetta Gosling, M.D.

## 2010-07-05 NOTE — Assessment & Plan Note (Signed)
[facility]   NAME:  [name], [name]                 ACCOUNT NO.:  [id number]   MEDICAL RECORD NO.:  [id number]      DATE OF BIRTH:  [birth year]   PHYSICIAN:  Jonelle Sports. Sevier, M.D.       VISIT DATE:                                   OFFICE VISIT   HISTORY:  This 60 year old black female who was followed for a cavitary  wound on the posterior medial right thigh that has occurred as a result  of fat necrosis in the area.  In addition, she has a smaller wound more  distally that appears to be primarily from chasing against adjacent  skin.  The former wound has been treated with Dakin's irrigations and  dry gauze packing, where as the smaller wound has been treated simply  with application of zinc oxide ointment.   The patient reports no problems since her last visit here a week ago and  that she with the help of a home health nurse and her husband has done  the daily treatments and dressing changes as recommended.   Most specifically, she has noted no increase in drainage, no odor, no  pain, no fever, or systemic symptoms.   PHYSICAL EXAMINATION:  VITAL SIGNS:  Blood pressure 167/85, pulse 85,  respirations 18, and temperature 98.2.   The smaller of the two wounds measures 1.0 x 1.5 x 0.05 cm in depth and  is really a quite indehiscent in appearance.   The site of the previous fatty necrosis abscess now measures 4.1 x 4.4 x  2.1 cm with 2.5-cm recess at 1 to 3 o'clock.  This wound now has minimal  yellowish exudate, but very definitely an odor that seems to indicating  a coliform odor.   IMPRESSION:  Given the satisfactory course and wound originating fat  necrosis, but with possible low-grade superinfection now with coliform  organism.   DISPOSITION:  The smaller of the two wounds will continue to be cleanse  daily and treated with an application of zinc oxide ointment.   The major wound will be changed in approach to irrigate daily with  saline, then wash  it well with warm soapy water, rinse it well then  apply Iodoform packing and cover with an absorptive pad.  This change  will be made by the patient and her husband and home health nurse will  be so instructed as well.   The wound has been cultured today, and the patient is placed on Cipro  500 mg p.o. b.i.d. and given #30 of these.   Followup visit will be here in 1 week.          ______________________________  Jonelle Sports. Cheryll Cockayne, M.D.    RES/MEDQ  D:  06/26/2007  T:  06/27/2007  Job:  [id number]

## 2010-07-05 NOTE — Group Therapy Note (Signed)
NAME:  [name], [name]                 ACCOUNT NO.:  [id number]   MEDICAL RECORD NO.:  [id number]         PATIENT TYPE:  HREC   LOCATION:                               FACILITY:  [facility]   PHYSICIAN:  Lenon Curt. Chilton Si, M.D.  DATE OF BIRTH:  1950-05-19                                 PROGRESS NOTE   WOUND CARE/HYPERBARIC CENTER   HISTORY:  A 60 year old female who was referred to wound care and hyperbaric  center in February, 2009 for ulcerations of the midline of the abdomen  and both thighs.  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  The patient continues to have pain and discomfort at these  wounds.  There is erythema, undermining, and a scooped-out appearance  about the size of an ice cream scoop for each of the wounds.  Previous  cultures have shown Proteus mirabilis.  The most recent one was done on  April 23, 2007.  She had been treated with Keflex in the past but has not  been on any antibiotics recently.   She is on Coumadin for a history of pulmonary emboli in January, 2009.  She has been told that she is not a good candidate for vacuum therapy to  the wounds because of her Coumadin therapy.   Patient last seen by Dr. Baltazar Najjar on 10/09/07.  The right leg had  moist-to-moist dressings, abdominal pad, and an Ace wrap.  The abdomen  had Polysporin applied and dressings.   PHYSICAL EXAMINATION:  Temp 98.2, pulse 81, respirations 16, blood pressure 148/82.  Blood  glucose 107 mg%.  Her wounds are quite deep and erythematous with a profuse drainage.  Patient has significant lower abdominal swelling as well as upper thigh  and labial swelling.  There is no abdominal pain on palpation of the  abdomen except close to the midline wound of the lower abdomen.  Similarly, there is some surrounding induration and significant  tenderness of the wounds of both of the thighs.  There is profuse  drainage  present.   PLAN:  I have changed dressings to Silvadene cream and ABD pads for absorbancy  to be done on a daily basis.  Prescription was written for Silvadene.  Also will resume antibiotics with amoxicillin 500 mg t.i.d. for 7 days.  We have asked her to increase her furosemide to 80 mg daily.   Patient was warned of potential for prolonged prothrombin time, putting  her on antibiotics, and she was asked to call Dr. Juanetta Gosling office.  Patient may also need basic metabolic panel drawn because of the  increase in her diuretic.   We will ask Dr. Juanetta Gosling to review her medical history, as he is her  primary doctor, to see if she still in need of the Coumadin, since these  wounds would appear to be ones that might be assisted by vacuum therapy.  If she continues to need the Coumadin therapy, then the possibility of  therapy of the wounds will be totally ruled out.  If she  no  longer needs the Coumadin nine months after the incidence of the  pulmonary emboli, then perhaps we can consider this at a future visit.      Lenon Curt Chilton Si, M.D.  Electronically Signed     AGG/MEDQ  D:  10/25/2007  T:  10/26/2007  Job:  [id number]   cc:   Ramon Dredge L. Juanetta Gosling, M.D.  Fax: [phone]

## 2010-07-05 NOTE — Op Note (Signed)
NAME:  [name], [name]                 ACCOUNT NO.:  [id number]   MEDICAL RECORD NO.:  [id number]          PATIENT TYPE:  AMB   LOCATION:  DAY                           FACILITY:  [facility]   PHYSICIAN:  Tilford Pillar, MD      DATE OF BIRTH:  08-25-1950   DATE OF PROCEDURE:  01/28/2007  DATE OF DISCHARGE:                               OPERATIVE REPORT   PREOPERATIVE DIAGNOSES:  1. Right posterolateral thigh necrotic wound ulcer.  2. Right posterior leg necrotic skin ulcer.   POSTOPERATIVE DIAGNOSES:  1. Right posterolateral thigh necrotic wound ulcer.  2. Right posterior leg necrotic skin ulcer.   PROCEDURES:  1. Excision and debridement of soft tissue of right posterior thigh      and posterior leg.  2. Intraoperative wound VAC placement.   SURGEON:  Tilford Pillar, MD.   ANESTHESIA:  General endotracheal anesthesia.   SPECIMENS:  Right thigh granulation and necrotic tissue eschar.   ESTIMATED BLOOD LOSS:  Minimal.   INDICATIONS FOR PROCEDURE:  Patient is a 60 year old [place] American  female who is well-known to me.  She is morbidly obese with diabetes  mellitus.  Had presented several months ago with a necrotic eschar of an  ulceration of her right posterior thigh.  This had bee treated for  several months with no resolution.  It was debrided previously, treated  with wet-to-dry dressings until wound VAC placement was enabled.  Wound  VAC therapy has diminished the size, however, recurrence of the necrotic  tissue within the wound has reoccurred, requiring additional  debridement.  The risks, benefits, and alternatives of additional  debridement were discussed with the patient as she has also begun to  develop another area of ulceration within the right posterior leg.  It  is advised this would also be evaluated and potentially debrided in the  operating room.  This was again discussed with the patient.   OPERATION:  Patient was taken to the operating room, was placed in the  supine position on the operating table at which time general anesthetic  was administered.  When she was asleep, she was endotracheally  intubated. At this point, her right leg was placed into a stirrup to  allow for exposure of the right posterolateral thigh and right posterior  leg.  At this point it was prepped and draped.  Using a combination of  electrocautery and sharp dissection, the right leg eschar was debrided.  Hemostasis was obtained and sterile surgical sponge was placed over the  area while debridement of the right posterior thigh wound was  undertaken.  Again, sharp dissection with the scalpel and electrocautery  dissection was undertaken to remove the tissue. Bleeding was noted from  the underlying adipose tissue.  Note, I did see the patient's body  habitus.  There is an extreme amount of adipose tissue in this area.  This is all quite edematous but no tracking of the wound or any  additional abscesses were localized in the area.  With hemostasis good,  a piece of wound VAC sponge was brought into the field, was  trimmed to  the appropriate size and was placed into both the right thigh and right  leg wounds.  This was covered with the adhesive dressing for the wound  VAC.  At this point, a bridging piece of sponge was placed over the  adhesive dressing after cutting holes over the previously placed  sponges.  This piece of bridging sponge was covered with a second layer  of adhesive dressing.  A solitary defect was cut above the bridging VAC  sponge and the suction adaptor was secured.  At this point, the drapes  were removed, the wound VAC was turned on, a piece of Tegaderm was  utilized to occlude a small leak which allowed excellent negative  pressure with the wound VAC.  At this point, the remainder of the leg  was washed, dried and cleaned.  The patient was transferred back to a  regular hospital bed and was transferred to the post anesthesia care  unit in stable  condition.  At the conclusion of the procedure, all  sponge, needle and instrument counts were correct.  The patient  tolerated the procedure well and to note, the patient will continue home  wound VAC therapy with visiting home nurse with plans to follow up in my  office in a week for reevaluation of the wound.      Tilford Pillar, MD  Electronically Signed     BZ/MEDQ  D:  01/28/2007  T:  01/28/2007  Job:  [id number]   cc:   Ramon Dredge L. Juanetta Gosling, M.D.  Fax: [phone]

## 2010-07-08 NOTE — Discharge Summary (Signed)
NAME:  [name], [name]                 ACCOUNT NO.:  [id number]   MEDICAL RECORD NO.:  [id number]          PATIENT TYPE:  INP   LOCATION:  A208                          FACILITY:  [facility]   PHYSICIAN:  Tilford Pillar, MD      DATE OF BIRTH:  1951-01-09   DATE OF ADMISSION:  02/07/2007  DATE OF DISCHARGE:  12/24/2008LH                               DISCHARGE SUMMARY   ADMISSION DIAGNOSES:  1. Right posterior medial thigh soft tissue ulcer.  2. Right posterior leg ulcer.   DISCHARGE DIAGNOSES:  1. Right lower extremity ulcers x2.  2. Diabetes mellitus.  3. Morbid obesity.  4. Hypertension.  5. Hypercholesterolemia.   PROCEDURES:  Pulse lavage of the ulcers.   CONSULTATIONS:  Physical therapy and occupational therapy.   HISTORY OF PRESENT ILLNESS:  Please see the admission history and  physical for the complete H&P.  The patient is an unfortunate  African/American female, well known to myself, who is seen in my office  on February 07, 2007, for a continued following of the ulcerations on  her right lower extremity.  Based on the findings in the office and the  continued poor improvement, it was recommended that she be admitted for  aggressive wound care.  She was directly admitted from my office to  [facility] on February 07, 2008.   HOSPITAL COURSE:  The patient was admitted on February 07, 2008, at  which point aggressive twice daily dressing changes were undertaken with  wet to dry dressings on both wounds.  Additionally, topical enzyme  treatment was initiated for both wounds.  This was performed on a daily  basis with the morning dressing changes.  In addition, she had a pulse  lavage performed.  As there was scant granulation tissue,  it was felt  that this would be the initial step.  If additional debridement was  required in the operating room, this would be proceeded; however, due to  the patient's size, it was felt that additional debridement would only  expose  additional edematous adipose tissue within the wound, with the  likelihood of similar results of the current wound.   She did continue to have improvement with this therapy and on February 13, 2007, she was discharged home with plans for continued enzyme  treatment and continued aggressive twice daily dressing changes.  She  previously had home nursing scheduled and this was planned to be  continued.   DISCHARGE INSTRUCTIONS:  1. The patient was instructed to continue a diabetic diet.  2. She is to increase activity as tolerated.  3. She may walk up steps.  4. She may shower.  5. She was instructed on wound care with placement of Santyl ointment,      applying to both wounds every other day, with dressing changes.      She is to do wet to dry dressings with gauze, covered with a 4 x 4      and ABD dressings, with expressed desire for twice daily dressing      changes if possible.   FOLLOWUP:  She is to return to my office in 10 days for followup.   DISCHARGE MEDICATIONS:  1. Augmentin 500 mg p.o. twice daily orally for 14 days.  2. She is to resume all previously-prescribed home medications      including Actos.  3. Verapamil.  4. __________  5. Celebrex.  6. Tramadol.      Tilford Pillar, MD  Electronically Signed     BZ/MEDQ  D:  02/27/2007  T:  02/27/2007  Job:  [id number]   cc:   Ramon Dredge L. Juanetta Gosling, M.D.  Fax: [phone]

## 2010-07-08 NOTE — Op Note (Signed)
NAME:  [name], [name]                           ACCOUNT NO.:  [id number]   MEDICAL RECORD NO.:  [id number]                   PATIENT TYPE:  AMB   LOCATION:  DAY                                  FACILITY:  [facility]   PHYSICIAN:  Gerrit Friends. Rourk, M.D.               DATE OF BIRTH:  03-09-1950   DATE OF PROCEDURE:  10/15/2001  DATE OF DISCHARGE:                                 OPERATIVE REPORT   PROCEDURE:  Diagnostic colonoscopy.   ENDOSCOPIST:  Gerrit Friends. Rourk, M.D.   INDICATIONS FOR PROCEDURE:  The patient is a 60 year old lady with some left-  sided abdominal pain.  Recently CT demonstrated only fatty appearing liver.  LFTs were okay.  CBC okay except for slightly MCV fo 77.7.  Amylase and  lipase were normal.  She has not had any gross evidence of GI bleeding.  She  has a positive family history of colorectal neoplasia.  She has been taking  hydrocodone and appears constipated more recently.  She has not had a OB/GYN  evaluation recently.  Colonoscopy is now being done in part for diagnostic  purposes and in part for colorectal cancer screening purposes.  This  approach has been discussed with the patient.  The potential risks,  benefits, and alternatives have been reviewed; questions answered.  She is  agreeable.  I feel that she is low risk for low dose conscious sedation in  the way of Versed and Demerol.  Please see my dictated H&P for more  information.   DESCRIPTION OF PROCEDURE:  O2 saturation, blood pressure, pulse and  respirations were monitored throughout the entire procedure.   CONSCIOUS SEDATION:  Versed 2  mg IV and Demerol 50 mg IV.   INSTRUMENT:  Olympus video chip adult colonoscope.   FINDINGS:  Digital rectal exam revealed a firm pea-sized nodule in the anal  canal.   ENDOSCOPIC FINDINGS:  The prep was adequate.   RECTUM:  Examination of the rectal mucosa including the retroflex view of  the anal verge and on _________ view of the anal canal revealed a  prominent  anal papilla in the anal canal.  Otherwise rectal mucosa appeared normal.   COLON:  The colonic mucosa was surveyed from the rectosigmoid junction  through the left transverse and right colon to the area of the appendiceal  orifice, ileocecal valve, and cecum.  These structures were well seen and  photographed for the record.  The colonic mucosa all the way to the cecum  appeared normal.   From the level of the cecum and ileocecal valve the scope was slowly  withdrawn;  and all previously mentioned mucosal surfaces were again seen;  no other abnormalities were observed.  The patient tolerated the procedure  well and was reacted in endoscopy.   IMPRESSION:  1. A single prominent anal papilla, otherwise normal rectum.  2. Normal colon.  3. No endoscopic  explanation for the patient's symptoms.  There is noted     constipation recently related to narcotic use.   RECOMMENDATIONS:  1. Crystallose laxative 20 gm orally, at bedtime, p.r.n. constipation.  2. Repeat CBC in 8 weeks.  3. Check a urinalysis to make sure that she is not having any hematuria.  4. She should have an OB/GYN evaluation just to make sure that she does not     have any gynecological issues that may be producing left-sided abdominal     pain.                                                Gerrit Friends. Rourk, M.D.    RMR/MEDQ  D:  10/15/2001  T:  10/16/2001  Job:  [id number]   cc:   Fredirick Maudlin, M.D.

## 2010-07-08 NOTE — Consult Note (Signed)
NAME:  [name], [name]                             ACCOUNT NO.:  [id number]   MEDICAL RECORD NO.:  [id number]                   PATIENT TYPE:   LOCATION:                                       FACILITY:   PHYSICIAN:  Gerrit Friends. Rourk, M.D.               DATE OF BIRTH:  1950-10-24   DATE OF CONSULTATION:  10/03/2001  DATE OF DISCHARGE:                                   CONSULTATION   REASON FOR REFERRAL:  Left lower quadrant abdominal pain, family history of  colon cancer.   HISTORY OF PRESENT ILLNESS:  The patient is a pleasant 60 year old morbidly  obese black female with history of hypertension and non-insulin-dependent  diabetes mellitus who presents today for further evaluation of left lower  quadrant abdominal pain at Dr. Juanetta Gosling' request.  She tells me that on July  31 she developed left-sided abdominal pain.  Pain is actually in the left  mid to left upper abdomen instead of left lower quadrant according to her  report.  Pain had sharp quality, was crampy and constant.  She presented to  the emergency department and was told that she may have a kidney stone;  however, she never passed one.  She denies having any blood work, but did  have a urinalysis, results unknown to me.  She followed up with Dr. Juanetta Gosling  as pain persisted.  He had a CT of the abdomen and pelvis done which  revealed mild diffuse fatty infiltration of the liver, but otherwise  unremarkable.  She continues to have left-sided abdominal pain to this day;  however, it has improved since being on medication.  She has taken two  hydrocodone daily for this.  Pain has improved some, however.  She denies  any history of antibiotic use or other treatment for this pain.  She tells  me that prior to development of this pain she had regular bowel movements.  However, since she has developed some constipation and is only having a  small amount of stools daily.  She used to have two to three bowel movements  daily.  Denies  any melena, rectal bleeding, nausea, vomiting, heartburn,  diarrhea.  She has never had a colonoscopy.  Her father had colon cancer  diagnosed age 83 at which time it was already metastatic.   CURRENT MEDICATIONS:  1. Glucovance 2.5/500 two b.i.d.  2. Tarka 4/240 one q.d.  3. Mobic 75 mg q.d.  4. Hydrocodone one b.i.d.   ALLERGIES:  No known drug allergies.   PAST MEDICAL HISTORY:  Non-insulin-dependent diabetes mellitus,  hypertension, arthritis.   PAST SURGICAL HISTORY:  Partial hysterectomy, one ovary remains.   FAMILY HISTORY:  Mother is deceased of diabetes and heart disease.  Father  died of metastatic colon cancer diagnosed at age 9, died age 44.  She has  10 siblings.  One sister has had a colonoscopy and did not  have any polyps.  She has a brother with lung cancer.   SOCIAL HISTORY:  She has been married for five years and has one child.  She  is employed with [employer] as a Lawyer.  She has never been a smoker.  Denies alcohol use.   REVIEW OF SYMPTOMS:  Please see HPI for GI.  GENERAL:  Denies any weight  loss.  GENITOURINARY:  Denies any dysuria or hematuria.   PHYSICAL EXAMINATION:  VITAL SIGNS:  Weight 327, height 5 feet 6 inches,  temperature 98.1, blood pressure 150/70, pulse 76.  GENERAL:  Very pleasant, morbidly obese black female in no acute distress.  SKIN:  Warm and dry.  No jaundice.  HEENT:  Conjunctivae are pink.  Sclerae nonicteric.  Oropharyngeal mucosa  moist and pink.  No lesions, erythema, or exudate.  No lymphadenopathy,  thyromegaly, carotid bruits.  CHEST:  Lungs are clear to auscultation.  CARDIAC:  Regular rate and rhythm.  Normal S1, S2.  No murmur, rub, or  gallop.  ABDOMEN:  Positive bowel sounds, obese, but symmetrical, soft.  Mild  tenderness in the left mid abdomen to left upper quadrant.  Nontender in the  left lower quadrant.  No abdominal hernias noted.  No organomegaly or  masses.  Examination is somewhat limited by body  habitus.  EXTREMITIES:  Trace pedal edema bilaterally.   IMPRESSION:  The patient is a pleasant 60 year old black female who has  approximately a three week history of left-sided abdominal pain primarily in  the left mid abdomen to left upper quadrant region.  CT of the abdomen and  pelvis revealed a fatty liver, but otherwise unremarkable.  Her pain has  since eased; however, she continues to take hydrocodone b.i.d. for this  pain.  She is now having some constipation which is new for her.  Multiple  etiologies exist.  Differential diagnoses include constipation, colonic  spasms, low grade diverticulitis, less likely pancreatitis (symptoms not  worsened with eating), partial colonic intestinal obstruction, but less  likely.  Not mentioned previously, patient's symptoms are not worsened by  eating.  Given her family history of colon cancer in a father diagnosed at  age 71, she is well overdue for high risk screening colonoscopy.  She also  has evidence of fatty liver on CT, in part secondary to diabetes and  obesity.   PLAN:  1. Will check LFTs, CBC, amylase, lipase.  2. Plan on high risk screening/diagnostic colonoscopy in the near future.  3. Further recommendations to follow.   I would like to thank Dr. Juanetta Gosling for allowing [place] to take part in the care  of this patient.     Tana Coast, PA                          Gerrit Friends. Rourk, M.D.    LL/MEDQ  D:  10/03/2001  T:  10/08/2001  Job:  [id number]   cc:   Fredirick Maudlin, M.D.

## 2010-11-10 LAB — CULTURE, BLOOD (ROUTINE X 2)
Culture: NO GROWTH
Report Status: 1302009
Report Status: 1302009

## 2010-11-10 LAB — FOLATE: Folate: 15

## 2010-11-10 LAB — CBC
HCT: 29.4 — ABNORMAL LOW
HCT: 30.8 — ABNORMAL LOW
HCT: 33.1 — ABNORMAL LOW
HCT: 35 — ABNORMAL LOW
Hemoglobin: 9.9 — ABNORMAL LOW
MCHC: 32.1
MCV: 79.4
Platelets: 201
Platelets: 255
Platelets: 271
RBC: 3.7 — ABNORMAL LOW
RBC: 4.15
RDW: 17 — ABNORMAL HIGH
WBC: 9.5

## 2010-11-10 LAB — DIFFERENTIAL
Basophils Absolute: 0
Basophils Absolute: 0
Basophils Absolute: 0
Basophils Relative: 0
Basophils Relative: 0
Basophils Relative: 1
Basophils Relative: 1
Eosinophils Absolute: 0.1
Eosinophils Absolute: 0.2
Eosinophils Relative: 1
Eosinophils Relative: 1
Eosinophils Relative: 5
Lymphocytes Relative: 27
Lymphocytes Relative: 27
Lymphs Abs: 1.7
Monocytes Absolute: 0.5
Monocytes Absolute: 0.5
Monocytes Absolute: 0.6
Neutro Abs: 7.7
Neutro Abs: 7.8 — ABNORMAL HIGH

## 2010-11-10 LAB — BASIC METABOLIC PANEL
BUN: 3 — ABNORMAL LOW
CO2: 23
CO2: 23
Calcium: 8.1 — ABNORMAL LOW
Calcium: 9
Calcium: 9.1
Chloride: 110
Creatinine, Ser: 0.61
GFR calc Af Amer: >60
GFR calc Af Amer: >60
GFR calc non Af Amer: >60
Glucose, Bld: 106 — ABNORMAL HIGH
Potassium: 3.3 — ABNORMAL LOW
Potassium: 4.2
Sodium: 140

## 2010-11-10 LAB — VITAMIN B12: Vitamin B-12: 232 (ref 211–911)

## 2010-11-10 LAB — B-NATRIURETIC PEPTIDE (CONVERTED LAB): Pro B Natriuretic peptide (BNP): 32.5

## 2010-11-10 LAB — RETICULOCYTES: RBC.: 4.25

## 2010-11-10 LAB — POCT CARDIAC MARKERS
CKMB, poc: 3.2
Myoglobin, poc: 85.4
Operator id: 166561
Operator id: 166561
Troponin i, poc: 0.11 — ABNORMAL HIGH

## 2010-11-10 LAB — TROPONIN I: Troponin I: 0.17 — ABNORMAL HIGH

## 2010-11-10 LAB — HEMOGLOBIN A1C: Hgb A1c MFr Bld: 5.9

## 2010-11-10 LAB — CK TOTAL AND CKMB (NOT AT ARMC)
CK, MB: 1.4
Relative Index: INVALID

## 2010-11-10 LAB — FERRITIN: Ferritin: 256 (ref 10–291)

## 2010-11-10 LAB — PROTIME-INR
INR: 1.2
INR: 1.3
INR: 1.9 — ABNORMAL HIGH
Prothrombin Time: 16.9 — ABNORMAL HIGH
Prothrombin Time: 22.2 — ABNORMAL HIGH
Prothrombin Time: 30.7 — ABNORMAL HIGH

## 2010-11-10 LAB — D-DIMER, QUANTITATIVE: D-Dimer, Quant: 4.61 — ABNORMAL HIGH

## 2010-11-10 LAB — IRON AND TIBC
Iron: 36 — ABNORMAL LOW
UIBC: 154

## 2010-11-11 LAB — CBC
HCT: 32.2 — ABNORMAL LOW
MCHC: 32
MCV: 80.9
Platelets: 308
RDW: 17.5 — ABNORMAL HIGH

## 2010-11-11 LAB — DIFFERENTIAL
Basophils Absolute: 0
Basophils Relative: 0
Eosinophils Absolute: 0
Eosinophils Relative: 0

## 2010-11-11 LAB — BASIC METABOLIC PANEL
BUN: 4 — ABNORMAL LOW
Chloride: 105
Creatinine, Ser: 0.6
Glucose, Bld: 175 — ABNORMAL HIGH
Potassium: 4.3

## 2010-11-11 LAB — PROTIME-INR: Prothrombin Time: 52.1 — ABNORMAL HIGH

## 2010-11-25 LAB — DIFFERENTIAL
Basophils Relative: 0
Eosinophils Absolute: 0.1 — ABNORMAL LOW
Monocytes Relative: 7
Neutrophils Relative %: 63

## 2010-11-25 LAB — CBC
MCHC: 31.5
MCV: 78.3
RBC: 3.97

## 2010-11-25 LAB — BASIC METABOLIC PANEL
BUN: 1 — ABNORMAL LOW
CO2: 27
Chloride: 108
Creatinine, Ser: 0.56
GFR calc Af Amer: >60
Glucose, Bld: 132 — ABNORMAL HIGH

## 2010-11-28 LAB — CBC
HCT: 34.7 — ABNORMAL LOW
MCV: 78.5
Platelets: 257
WBC: 8.5

## 2010-11-28 LAB — BASIC METABOLIC PANEL
BUN: 12
Calcium: 9
GFR calc non Af Amer: >60
Glucose, Bld: 75
Sodium: 139

## 2011-01-31 DIAGNOSIS — L97109 Non-pressure chronic ulcer of unspecified thigh with unspecified severity: Secondary | ICD-10-CM | POA: Diagnosis not present

## 2011-01-31 DIAGNOSIS — L98499 Non-pressure chronic ulcer of skin of other sites with unspecified severity: Secondary | ICD-10-CM | POA: Diagnosis not present

## 2011-01-31 DIAGNOSIS — E119 Type 2 diabetes mellitus without complications: Secondary | ICD-10-CM | POA: Diagnosis not present

## 2011-01-31 DIAGNOSIS — I890 Lymphedema, not elsewhere classified: Secondary | ICD-10-CM | POA: Diagnosis not present

## 2011-02-01 DIAGNOSIS — L97109 Non-pressure chronic ulcer of unspecified thigh with unspecified severity: Secondary | ICD-10-CM | POA: Diagnosis not present

## 2011-02-01 DIAGNOSIS — I890 Lymphedema, not elsewhere classified: Secondary | ICD-10-CM | POA: Diagnosis not present

## 2011-02-01 DIAGNOSIS — E119 Type 2 diabetes mellitus without complications: Secondary | ICD-10-CM | POA: Diagnosis not present

## 2011-02-01 DIAGNOSIS — L98499 Non-pressure chronic ulcer of skin of other sites with unspecified severity: Secondary | ICD-10-CM | POA: Diagnosis not present

## 2011-02-02 DIAGNOSIS — E119 Type 2 diabetes mellitus without complications: Secondary | ICD-10-CM | POA: Diagnosis not present

## 2011-02-02 DIAGNOSIS — L97109 Non-pressure chronic ulcer of unspecified thigh with unspecified severity: Secondary | ICD-10-CM | POA: Diagnosis not present

## 2011-02-02 DIAGNOSIS — L98499 Non-pressure chronic ulcer of skin of other sites with unspecified severity: Secondary | ICD-10-CM | POA: Diagnosis not present

## 2011-02-02 DIAGNOSIS — I890 Lymphedema, not elsewhere classified: Secondary | ICD-10-CM | POA: Diagnosis not present

## 2011-02-06 DIAGNOSIS — L97109 Non-pressure chronic ulcer of unspecified thigh with unspecified severity: Secondary | ICD-10-CM | POA: Diagnosis not present

## 2011-02-06 DIAGNOSIS — E119 Type 2 diabetes mellitus without complications: Secondary | ICD-10-CM | POA: Diagnosis not present

## 2011-02-06 DIAGNOSIS — I890 Lymphedema, not elsewhere classified: Secondary | ICD-10-CM | POA: Diagnosis not present

## 2011-02-06 DIAGNOSIS — L98499 Non-pressure chronic ulcer of skin of other sites with unspecified severity: Secondary | ICD-10-CM | POA: Diagnosis not present

## 2011-02-15 DIAGNOSIS — E119 Type 2 diabetes mellitus without complications: Secondary | ICD-10-CM | POA: Diagnosis not present

## 2011-02-15 DIAGNOSIS — I890 Lymphedema, not elsewhere classified: Secondary | ICD-10-CM | POA: Diagnosis not present

## 2011-02-15 DIAGNOSIS — L97109 Non-pressure chronic ulcer of unspecified thigh with unspecified severity: Secondary | ICD-10-CM | POA: Diagnosis not present

## 2011-02-15 DIAGNOSIS — L98499 Non-pressure chronic ulcer of skin of other sites with unspecified severity: Secondary | ICD-10-CM | POA: Diagnosis not present

## 2011-02-22 DIAGNOSIS — E119 Type 2 diabetes mellitus without complications: Secondary | ICD-10-CM | POA: Diagnosis not present

## 2011-02-22 DIAGNOSIS — I890 Lymphedema, not elsewhere classified: Secondary | ICD-10-CM | POA: Diagnosis not present

## 2011-02-22 DIAGNOSIS — L98499 Non-pressure chronic ulcer of skin of other sites with unspecified severity: Secondary | ICD-10-CM | POA: Diagnosis not present

## 2011-02-22 DIAGNOSIS — L97109 Non-pressure chronic ulcer of unspecified thigh with unspecified severity: Secondary | ICD-10-CM | POA: Diagnosis not present

## 2011-03-01 DIAGNOSIS — L98499 Non-pressure chronic ulcer of skin of other sites with unspecified severity: Secondary | ICD-10-CM | POA: Diagnosis not present

## 2011-03-01 DIAGNOSIS — I890 Lymphedema, not elsewhere classified: Secondary | ICD-10-CM | POA: Diagnosis not present

## 2011-03-01 DIAGNOSIS — E119 Type 2 diabetes mellitus without complications: Secondary | ICD-10-CM | POA: Diagnosis not present

## 2011-03-01 DIAGNOSIS — L97109 Non-pressure chronic ulcer of unspecified thigh with unspecified severity: Secondary | ICD-10-CM | POA: Diagnosis not present

## 2011-03-08 DIAGNOSIS — L98499 Non-pressure chronic ulcer of skin of other sites with unspecified severity: Secondary | ICD-10-CM | POA: Diagnosis not present

## 2011-03-08 DIAGNOSIS — L97109 Non-pressure chronic ulcer of unspecified thigh with unspecified severity: Secondary | ICD-10-CM | POA: Diagnosis not present

## 2011-03-08 DIAGNOSIS — E119 Type 2 diabetes mellitus without complications: Secondary | ICD-10-CM | POA: Diagnosis not present

## 2011-03-08 DIAGNOSIS — I890 Lymphedema, not elsewhere classified: Secondary | ICD-10-CM | POA: Diagnosis not present

## 2011-03-15 DIAGNOSIS — L98499 Non-pressure chronic ulcer of skin of other sites with unspecified severity: Secondary | ICD-10-CM | POA: Diagnosis not present

## 2011-03-15 DIAGNOSIS — I890 Lymphedema, not elsewhere classified: Secondary | ICD-10-CM | POA: Diagnosis not present

## 2011-03-15 DIAGNOSIS — L97109 Non-pressure chronic ulcer of unspecified thigh with unspecified severity: Secondary | ICD-10-CM | POA: Diagnosis not present

## 2011-03-15 DIAGNOSIS — E119 Type 2 diabetes mellitus without complications: Secondary | ICD-10-CM | POA: Diagnosis not present

## 2011-03-31 DIAGNOSIS — S81809A Unspecified open wound, unspecified lower leg, initial encounter: Secondary | ICD-10-CM | POA: Diagnosis not present

## 2011-03-31 DIAGNOSIS — I82409 Acute embolism and thrombosis of unspecified deep veins of unspecified lower extremity: Secondary | ICD-10-CM | POA: Diagnosis not present

## 2011-03-31 DIAGNOSIS — S91009A Unspecified open wound, unspecified ankle, initial encounter: Secondary | ICD-10-CM | POA: Diagnosis not present

## 2011-04-17 DIAGNOSIS — L97909 Non-pressure chronic ulcer of unspecified part of unspecified lower leg with unspecified severity: Secondary | ICD-10-CM | POA: Diagnosis not present

## 2011-04-17 DIAGNOSIS — E1159 Type 2 diabetes mellitus with other circulatory complications: Secondary | ICD-10-CM | POA: Diagnosis not present

## 2011-04-17 DIAGNOSIS — L97109 Non-pressure chronic ulcer of unspecified thigh with unspecified severity: Secondary | ICD-10-CM | POA: Diagnosis not present

## 2011-04-17 DIAGNOSIS — I890 Lymphedema, not elsewhere classified: Secondary | ICD-10-CM | POA: Diagnosis not present

## 2011-04-17 DIAGNOSIS — L851 Acquired keratosis [keratoderma] palmaris et plantaris: Secondary | ICD-10-CM | POA: Diagnosis not present

## 2011-04-17 DIAGNOSIS — E119 Type 2 diabetes mellitus without complications: Secondary | ICD-10-CM | POA: Diagnosis not present

## 2011-06-12 DIAGNOSIS — E119 Type 2 diabetes mellitus without complications: Secondary | ICD-10-CM | POA: Diagnosis not present

## 2011-06-12 DIAGNOSIS — I890 Lymphedema, not elsewhere classified: Secondary | ICD-10-CM | POA: Diagnosis not present

## 2011-06-12 DIAGNOSIS — L97909 Non-pressure chronic ulcer of unspecified part of unspecified lower leg with unspecified severity: Secondary | ICD-10-CM | POA: Diagnosis not present

## 2011-06-12 DIAGNOSIS — L97109 Non-pressure chronic ulcer of unspecified thigh with unspecified severity: Secondary | ICD-10-CM | POA: Diagnosis not present

## 2011-06-12 DIAGNOSIS — E1159 Type 2 diabetes mellitus with other circulatory complications: Secondary | ICD-10-CM | POA: Diagnosis not present

## 2011-06-28 DIAGNOSIS — E669 Obesity, unspecified: Secondary | ICD-10-CM | POA: Diagnosis not present

## 2011-06-28 DIAGNOSIS — S81009A Unspecified open wound, unspecified knee, initial encounter: Secondary | ICD-10-CM | POA: Diagnosis not present

## 2011-06-28 DIAGNOSIS — S91009A Unspecified open wound, unspecified ankle, initial encounter: Secondary | ICD-10-CM | POA: Diagnosis not present

## 2011-06-28 DIAGNOSIS — I10 Essential (primary) hypertension: Secondary | ICD-10-CM | POA: Diagnosis not present

## 2011-08-11 DIAGNOSIS — L97109 Non-pressure chronic ulcer of unspecified thigh with unspecified severity: Secondary | ICD-10-CM | POA: Diagnosis not present

## 2011-08-11 DIAGNOSIS — E1159 Type 2 diabetes mellitus with other circulatory complications: Secondary | ICD-10-CM | POA: Diagnosis not present

## 2011-08-11 DIAGNOSIS — I890 Lymphedema, not elsewhere classified: Secondary | ICD-10-CM | POA: Diagnosis not present

## 2011-08-11 DIAGNOSIS — L97909 Non-pressure chronic ulcer of unspecified part of unspecified lower leg with unspecified severity: Secondary | ICD-10-CM | POA: Diagnosis not present

## 2011-08-11 DIAGNOSIS — I798 Other disorders of arteries, arterioles and capillaries in diseases classified elsewhere: Secondary | ICD-10-CM | POA: Diagnosis not present

## 2011-09-28 DIAGNOSIS — E109 Type 1 diabetes mellitus without complications: Secondary | ICD-10-CM | POA: Diagnosis not present

## 2011-09-28 DIAGNOSIS — M1990 Unspecified osteoarthritis, unspecified site: Secondary | ICD-10-CM | POA: Diagnosis not present

## 2011-09-28 DIAGNOSIS — I83219 Varicose veins of right lower extremity with both ulcer of unspecified site and inflammation: Secondary | ICD-10-CM | POA: Diagnosis not present

## 2011-11-10 DIAGNOSIS — E1159 Type 2 diabetes mellitus with other circulatory complications: Secondary | ICD-10-CM | POA: Diagnosis not present

## 2011-11-10 DIAGNOSIS — T8189XA Other complications of procedures, not elsewhere classified, initial encounter: Secondary | ICD-10-CM | POA: Diagnosis not present

## 2011-11-10 DIAGNOSIS — L97909 Non-pressure chronic ulcer of unspecified part of unspecified lower leg with unspecified severity: Secondary | ICD-10-CM | POA: Diagnosis not present

## 2011-11-10 DIAGNOSIS — L97109 Non-pressure chronic ulcer of unspecified thigh with unspecified severity: Secondary | ICD-10-CM | POA: Diagnosis not present

## 2011-11-10 DIAGNOSIS — I890 Lymphedema, not elsewhere classified: Secondary | ICD-10-CM | POA: Diagnosis not present

## 2011-12-20 ENCOUNTER — Encounter: Payer: Self-pay | Admitting: Cardiology

## 2011-12-20 ENCOUNTER — Ambulatory Visit (INDEPENDENT_AMBULATORY_CARE_PROVIDER_SITE_OTHER): Payer: Medicare Other | Admitting: Cardiology

## 2011-12-20 VITALS — BP 126/78 | HR 84 | Ht 66.0 in

## 2011-12-20 DIAGNOSIS — R9431 Abnormal electrocardiogram [ECG] [EKG]: Secondary | ICD-10-CM | POA: Diagnosis not present

## 2011-12-20 DIAGNOSIS — I10 Essential (primary) hypertension: Secondary | ICD-10-CM

## 2011-12-20 DIAGNOSIS — Z01810 Encounter for preprocedural cardiovascular examination: Secondary | ICD-10-CM | POA: Diagnosis not present

## 2011-12-20 DIAGNOSIS — E119 Type 2 diabetes mellitus without complications: Secondary | ICD-10-CM

## 2011-12-20 DIAGNOSIS — Z01818 Encounter for other preprocedural examination: Secondary | ICD-10-CM

## 2011-12-20 DIAGNOSIS — Z86711 Personal history of pulmonary embolism: Secondary | ICD-10-CM

## 2011-12-20 NOTE — Progress Notes (Signed)
Clinical Summary Ms. Tremper is a 61 y.o.female referred for cardiac preoperative evaluation by Dr. Juanetta Gosling. She is being considered for surgery related to poorly healing wounds on her right thigh with associated lymphedema at [facility] under general anesthesia.   Her medical history is noted below. She has not had recently documented pulmonary emboli, but is functionally very limited, morbidly obese, and has been on anticoagulation for prophylaxis. She states that she is able to stand and walk for a limited distance without chest pain, but is very limited related to her size and lymphedema. She reports no chest pain or palpitations. She is not aware of any personal history of CAD.  ECG today shows sinus rhythm with low voltage and decreased R wave progression.  She was unable to be weighed today, appears to be in a 400 pound range.   No Known Allergies  Current Outpatient Prescriptions  Medication Sig Dispense Refill  . CELEBREX 200 MG capsule Take 200 mg by mouth daily.       Marland Kitchen glyBURIDE-metformin (GLUCOVANCE) 5-500 MG per tablet Take 1 tablet by mouth 3 (three) times daily.       Marland Kitchen oxyCODONE-acetaminophen (PERCOCET/ROXICET) 5-325 MG per tablet Take 1 tablet by mouth 4 (four) times daily.       . trandolapril (MAVIK) 4 MG tablet Take 4 mg by mouth daily.       . verapamil (CALAN-SR) 240 MG CR tablet Take 240 mg by mouth daily.       Carlena Hurl 20 MG TABS Take 20 mg by mouth daily.         Past Medical History  Diagnosis Date  . Essential hypertension, benign   . Type 2 diabetes mellitus   . Skin ulcer of thigh   . Pulmonary emboli 2009  . History of MRSA infection   . History of panniculitis     Past Surgical History  Procedure Date  . Partial hysterectomy   . Right thigh ulcer debridement 2008    Two occasions - wound VAC    Family History  Problem Relation Age of Onset  . Cancer - Colon Father   . Heart disease Mother   . Diabetes Mellitus II Mother     Social  History Ms. Foskey reports that she has never smoked. She does not have any smokeless tobacco history on file. Ms. Nelms reports that she does not drink alcohol.  Review of Systems No reported bleeding problems on Xarelto. No palpitations or syncope. No chest pain. Reports NYHA class 2 dyspnea at baseline. No fevers or chills. Otherwise negative.  Physical Examination Filed Vitals:   12/20/11 1343  BP: 126/78  Pulse: 84   Morbidly obese woman seated in wheelchair in no acute distress. HEENT: Conjunctiva and lids normal, oropharynx clear. Neck: Supple, increased girth, unable to assess JVP, no obvious bruits, no thyromegaly. Lungs: Clear to auscultation, diminished, nonlabored breathing at rest. Cardiac: Regular rate and rhythm, no S3 or significant systolic murmur, no pericardial rub. Abdomen: Protuberant, nontender, bowel sounds present. Extremities: Extensive lymphedema noted, unable to accurately assess distal leg pulses. Radial pulses are normal however. Skin: Warm and dry. Musculoskeletal: No kyphosis. Neuropsychiatric: Alert and oriented x3, affect grossly appropriate.   Problem List and Plan   Preoperative cardiovascular examination Patient being considered for surgery related to poorly healing wound of the right thigh and lymphedema under general anesthesia at [facility]. This has not been scheduled. She has a very limited functional status as outlined above, but does not  report any chest pain or personal history of CAD. Her ECG is abnormal showing poor R-wave progression, although this may be related to body habitus and lead placement. She does have history of diabetes mellitus and hypertension and therefore increased risk of cardiovascular disease. Our plan is to obtain a transthoracic echocardiogram, and if the images are adequate, we will arrange a dobutamine echocardiogram for ischemic evaluation. If the images are not adequate, we may be able to consider a Definity contrast  dobutamine echocardiogram through our [place] office. Further plans to follow.  Morbid obesity Limits noninvasive cardiac testing.  History of pulmonary embolus (PE) Documented 2008, but continues on Xarelto for prophylaxis per Dr. Juanetta Gosling.  Essential hypertension, benign Blood pressure control is good today.  Type 2 diabetes mellitus Followed by Dr. Juanetta Gosling.    Jonelle Sidle, M.D., F.A.C.C.

## 2011-12-20 NOTE — Assessment & Plan Note (Signed)
Patient being considered for surgery related to poorly healing wound of the right thigh and lymphedema under general anesthesia at [facility]. This has not been scheduled. She has a very limited functional status as outlined above, but does not report any chest pain or personal history of CAD. Her ECG is abnormal showing poor R-wave progression, although this may be related to body habitus and lead placement. She does have history of diabetes mellitus and hypertension and therefore increased risk of cardiovascular disease. Our plan is to obtain a transthoracic echocardiogram, and if the images are adequate, we will arrange a dobutamine echocardiogram for ischemic evaluation. If the images are not adequate, we may be able to consider a Definity contrast dobutamine echocardiogram through our [place] office. Further plans to follow.

## 2011-12-20 NOTE — Assessment & Plan Note (Signed)
Followed by Dr. Hawkins. 

## 2011-12-20 NOTE — Patient Instructions (Addendum)
Your physician recommends that you schedule a follow-up appointment in: To be determined   Your physician has requested that you have an echocardiogram. Echocardiography is a painless test that uses sound waves to create images of your heart. It provides your doctor with information about the size and shape of your heart and how well your heart's chambers and valves are working. This procedure takes approximately one hour. There are no restrictions for this procedure.    

## 2011-12-20 NOTE — Assessment & Plan Note (Signed)
Limits noninvasive cardiac testing.

## 2011-12-20 NOTE — Assessment & Plan Note (Signed)
Blood pressure control is good today. 

## 2011-12-20 NOTE — Assessment & Plan Note (Signed)
Documented 2008, but continues on Xarelto for prophylaxis per Dr. Juanetta Gosling.

## 2011-12-27 DIAGNOSIS — I83219 Varicose veins of right lower extremity with both ulcer of unspecified site and inflammation: Secondary | ICD-10-CM | POA: Diagnosis not present

## 2011-12-27 DIAGNOSIS — L97919 Non-pressure chronic ulcer of unspecified part of right lower leg with unspecified severity: Secondary | ICD-10-CM | POA: Diagnosis not present

## 2011-12-27 DIAGNOSIS — E109 Type 1 diabetes mellitus without complications: Secondary | ICD-10-CM | POA: Diagnosis not present

## 2011-12-27 DIAGNOSIS — Z23 Encounter for immunization: Secondary | ICD-10-CM | POA: Diagnosis not present

## 2011-12-27 DIAGNOSIS — I10 Essential (primary) hypertension: Secondary | ICD-10-CM | POA: Diagnosis not present

## 2011-12-27 DIAGNOSIS — E669 Obesity, unspecified: Secondary | ICD-10-CM | POA: Diagnosis not present

## 2011-12-29 ENCOUNTER — Ambulatory Visit (HOSPITAL_COMMUNITY)
Admission: RE | Admit: 2011-12-29 | Discharge: 2011-12-29 | Disposition: A | Discharge status: Home / Self Care | Payer: Medicare Other | Source: Ambulatory Visit | Attending: Cardiology | Admitting: Cardiology

## 2011-12-29 DIAGNOSIS — I10 Essential (primary) hypertension: Secondary | ICD-10-CM | POA: Diagnosis not present

## 2011-12-29 DIAGNOSIS — E119 Type 2 diabetes mellitus without complications: Secondary | ICD-10-CM | POA: Diagnosis not present

## 2011-12-29 DIAGNOSIS — I517 Cardiomegaly: Secondary | ICD-10-CM | POA: Diagnosis not present

## 2011-12-29 DIAGNOSIS — R9431 Abnormal electrocardiogram [ECG] [EKG]: Secondary | ICD-10-CM | POA: Insufficient documentation

## 2011-12-29 DIAGNOSIS — Z01818 Encounter for other preprocedural examination: Secondary | ICD-10-CM

## 2011-12-29 NOTE — Progress Notes (Signed)
*  PRELIMINARY RESULTS* Echocardiogram 2D Echocardiogram has been performed.  [name] 12/29/2011, 2:12 PM

## 2012-01-01 ENCOUNTER — Other Ambulatory Visit: Payer: Self-pay | Admitting: *Deleted

## 2012-01-01 DIAGNOSIS — R9431 Abnormal electrocardiogram [ECG] [EKG]: Secondary | ICD-10-CM

## 2012-01-09 ENCOUNTER — Ambulatory Visit (HOSPITAL_COMMUNITY): Payer: Medicare Other | Attending: Cardiology

## 2012-01-09 ENCOUNTER — Encounter: Payer: Self-pay | Admitting: Cardiology

## 2012-01-09 ENCOUNTER — Other Ambulatory Visit (HOSPITAL_COMMUNITY): Payer: Self-pay | Admitting: *Deleted

## 2012-01-09 DIAGNOSIS — Z01810 Encounter for preprocedural cardiovascular examination: Secondary | ICD-10-CM

## 2012-01-09 DIAGNOSIS — R9431 Abnormal electrocardiogram [ECG] [EKG]: Secondary | ICD-10-CM | POA: Diagnosis not present

## 2012-01-09 DIAGNOSIS — Z86711 Personal history of pulmonary embolism: Secondary | ICD-10-CM | POA: Diagnosis not present

## 2012-01-09 DIAGNOSIS — R0989 Other specified symptoms and signs involving the circulatory and respiratory systems: Secondary | ICD-10-CM

## 2012-01-09 MED ORDER — SODIUM CHLORIDE 0.9 % IV SOLN
30.0000 ug/kg | INTRAVENOUS | Status: DC
  Administered 2012-01-09: 30 ug/kg/min via INTRAVENOUS

## 2012-01-09 MED ORDER — PERFLUTREN PROTEIN A MICROSPH IV SUSP
0.5000 mL | Freq: Once | INTRAVENOUS | Status: AC
  Administered 2012-01-09: 0.5 mL via INTRAVENOUS

## 2012-01-09 NOTE — Progress Notes (Signed)
Echocardiogram performed.  

## 2012-01-09 NOTE — Progress Notes (Signed)
Lot # of [employer] that was given on 01/09/12 was [id number] and expired 02/24/13. Cindy Zaniel Marineau,RN

## 2012-01-12 ENCOUNTER — Encounter: Payer: Self-pay | Admitting: *Deleted

## 2012-01-26 DIAGNOSIS — L97909 Non-pressure chronic ulcer of unspecified part of unspecified lower leg with unspecified severity: Secondary | ICD-10-CM | POA: Diagnosis not present

## 2012-01-26 DIAGNOSIS — T8189XA Other complications of procedures, not elsewhere classified, initial encounter: Secondary | ICD-10-CM | POA: Diagnosis not present

## 2012-01-26 DIAGNOSIS — L97109 Non-pressure chronic ulcer of unspecified thigh with unspecified severity: Secondary | ICD-10-CM | POA: Diagnosis not present

## 2012-03-28 DIAGNOSIS — E669 Obesity, unspecified: Secondary | ICD-10-CM | POA: Diagnosis not present

## 2012-03-28 DIAGNOSIS — S91009A Unspecified open wound, unspecified ankle, initial encounter: Secondary | ICD-10-CM | POA: Diagnosis not present

## 2012-03-28 DIAGNOSIS — S81009A Unspecified open wound, unspecified knee, initial encounter: Secondary | ICD-10-CM | POA: Diagnosis not present

## 2012-03-28 DIAGNOSIS — E109 Type 1 diabetes mellitus without complications: Secondary | ICD-10-CM | POA: Diagnosis not present

## 2012-03-28 DIAGNOSIS — I10 Essential (primary) hypertension: Secondary | ICD-10-CM | POA: Diagnosis not present

## 2012-04-19 DIAGNOSIS — T8189XA Other complications of procedures, not elsewhere classified, initial encounter: Secondary | ICD-10-CM | POA: Diagnosis not present

## 2012-04-19 DIAGNOSIS — L97109 Non-pressure chronic ulcer of unspecified thigh with unspecified severity: Secondary | ICD-10-CM | POA: Diagnosis not present

## 2012-04-19 DIAGNOSIS — L97909 Non-pressure chronic ulcer of unspecified part of unspecified lower leg with unspecified severity: Secondary | ICD-10-CM | POA: Diagnosis not present

## 2012-06-26 DIAGNOSIS — I10 Essential (primary) hypertension: Secondary | ICD-10-CM | POA: Diagnosis not present

## 2012-06-26 DIAGNOSIS — E669 Obesity, unspecified: Secondary | ICD-10-CM | POA: Diagnosis not present

## 2012-06-26 DIAGNOSIS — K649 Unspecified hemorrhoids: Secondary | ICD-10-CM | POA: Diagnosis not present

## 2012-06-26 DIAGNOSIS — E109 Type 1 diabetes mellitus without complications: Secondary | ICD-10-CM | POA: Diagnosis not present

## 2012-07-19 DIAGNOSIS — I890 Lymphedema, not elsewhere classified: Secondary | ICD-10-CM | POA: Diagnosis not present

## 2012-07-19 DIAGNOSIS — E1159 Type 2 diabetes mellitus with other circulatory complications: Secondary | ICD-10-CM | POA: Diagnosis not present

## 2012-07-19 DIAGNOSIS — T8189XA Other complications of procedures, not elsewhere classified, initial encounter: Secondary | ICD-10-CM | POA: Diagnosis not present

## 2012-07-19 DIAGNOSIS — L97109 Non-pressure chronic ulcer of unspecified thigh with unspecified severity: Secondary | ICD-10-CM | POA: Diagnosis not present

## 2012-09-25 DIAGNOSIS — E109 Type 1 diabetes mellitus without complications: Secondary | ICD-10-CM | POA: Diagnosis not present

## 2012-09-25 DIAGNOSIS — I10 Essential (primary) hypertension: Secondary | ICD-10-CM | POA: Diagnosis not present

## 2012-09-25 DIAGNOSIS — E669 Obesity, unspecified: Secondary | ICD-10-CM | POA: Diagnosis not present

## 2012-10-04 DIAGNOSIS — L97109 Non-pressure chronic ulcer of unspecified thigh with unspecified severity: Secondary | ICD-10-CM | POA: Diagnosis not present

## 2012-10-04 DIAGNOSIS — I890 Lymphedema, not elsewhere classified: Secondary | ICD-10-CM | POA: Diagnosis not present

## 2012-10-04 DIAGNOSIS — E1159 Type 2 diabetes mellitus with other circulatory complications: Secondary | ICD-10-CM | POA: Diagnosis not present

## 2013-01-06 DIAGNOSIS — E109 Type 1 diabetes mellitus without complications: Secondary | ICD-10-CM | POA: Diagnosis not present

## 2013-01-06 DIAGNOSIS — E669 Obesity, unspecified: Secondary | ICD-10-CM | POA: Diagnosis not present

## 2013-01-06 DIAGNOSIS — S81009A Unspecified open wound, unspecified knee, initial encounter: Secondary | ICD-10-CM | POA: Diagnosis not present

## 2013-01-06 DIAGNOSIS — Z23 Encounter for immunization: Secondary | ICD-10-CM | POA: Diagnosis not present

## 2013-01-06 DIAGNOSIS — I10 Essential (primary) hypertension: Secondary | ICD-10-CM | POA: Diagnosis not present

## 2013-05-05 DIAGNOSIS — M545 Low back pain, unspecified: Secondary | ICD-10-CM | POA: Diagnosis not present

## 2013-05-05 DIAGNOSIS — E109 Type 1 diabetes mellitus without complications: Secondary | ICD-10-CM | POA: Diagnosis not present

## 2013-05-05 DIAGNOSIS — E669 Obesity, unspecified: Secondary | ICD-10-CM | POA: Diagnosis not present

## 2013-08-05 DIAGNOSIS — E669 Obesity, unspecified: Secondary | ICD-10-CM | POA: Diagnosis not present

## 2013-08-05 DIAGNOSIS — I2699 Other pulmonary embolism without acute cor pulmonale: Secondary | ICD-10-CM | POA: Diagnosis not present

## 2013-08-05 DIAGNOSIS — E109 Type 1 diabetes mellitus without complications: Secondary | ICD-10-CM | POA: Diagnosis not present

## 2013-08-05 DIAGNOSIS — I10 Essential (primary) hypertension: Secondary | ICD-10-CM | POA: Diagnosis not present

## 2013-11-05 DIAGNOSIS — I10 Essential (primary) hypertension: Secondary | ICD-10-CM | POA: Diagnosis not present

## 2013-11-05 DIAGNOSIS — M545 Low back pain, unspecified: Secondary | ICD-10-CM | POA: Diagnosis not present

## 2013-11-05 DIAGNOSIS — E109 Type 1 diabetes mellitus without complications: Secondary | ICD-10-CM | POA: Diagnosis not present

## 2013-11-05 DIAGNOSIS — Z23 Encounter for immunization: Secondary | ICD-10-CM | POA: Diagnosis not present

## 2013-11-05 DIAGNOSIS — E669 Obesity, unspecified: Secondary | ICD-10-CM | POA: Diagnosis not present

## 2014-02-04 ENCOUNTER — Other Ambulatory Visit (HOSPITAL_COMMUNITY): Payer: Self-pay | Admitting: Pulmonary Disease

## 2014-02-04 ENCOUNTER — Ambulatory Visit (HOSPITAL_COMMUNITY)
Admission: RE | Admit: 2014-02-04 | Discharge: 2014-02-04 | Disposition: A | Discharge status: Home / Self Care | Payer: Medicare Other | Source: Ambulatory Visit | Attending: Pulmonary Disease | Admitting: Pulmonary Disease

## 2014-02-04 DIAGNOSIS — R202 Paresthesia of skin: Secondary | ICD-10-CM | POA: Diagnosis not present

## 2014-02-04 DIAGNOSIS — I10 Essential (primary) hypertension: Secondary | ICD-10-CM | POA: Diagnosis not present

## 2014-02-04 DIAGNOSIS — M79601 Pain in right arm: Secondary | ICD-10-CM

## 2014-02-04 DIAGNOSIS — E119 Type 2 diabetes mellitus without complications: Secondary | ICD-10-CM | POA: Diagnosis not present

## 2014-02-04 DIAGNOSIS — M5032 Other cervical disc degeneration, mid-cervical region: Secondary | ICD-10-CM | POA: Diagnosis not present

## 2014-02-04 DIAGNOSIS — G5691 Unspecified mononeuropathy of right upper limb: Secondary | ICD-10-CM | POA: Diagnosis not present

## 2014-05-07 DIAGNOSIS — G5630 Lesion of radial nerve, unspecified upper limb: Secondary | ICD-10-CM | POA: Diagnosis not present

## 2014-05-07 DIAGNOSIS — E119 Type 2 diabetes mellitus without complications: Secondary | ICD-10-CM | POA: Diagnosis not present

## 2014-05-07 DIAGNOSIS — I10 Essential (primary) hypertension: Secondary | ICD-10-CM | POA: Diagnosis not present

## 2014-05-25 DIAGNOSIS — E1140 Type 2 diabetes mellitus with diabetic neuropathy, unspecified: Secondary | ICD-10-CM | POA: Diagnosis not present

## 2014-05-25 DIAGNOSIS — R2681 Unsteadiness on feet: Secondary | ICD-10-CM | POA: Diagnosis not present

## 2014-05-25 DIAGNOSIS — M1990 Unspecified osteoarthritis, unspecified site: Secondary | ICD-10-CM | POA: Diagnosis not present

## 2014-05-25 DIAGNOSIS — G5600 Carpal tunnel syndrome, unspecified upper limb: Secondary | ICD-10-CM | POA: Diagnosis not present

## 2014-06-03 DIAGNOSIS — E119 Type 2 diabetes mellitus without complications: Secondary | ICD-10-CM | POA: Diagnosis not present

## 2014-06-03 DIAGNOSIS — G5600 Carpal tunnel syndrome, unspecified upper limb: Secondary | ICD-10-CM | POA: Diagnosis not present

## 2014-06-03 DIAGNOSIS — E1140 Type 2 diabetes mellitus with diabetic neuropathy, unspecified: Secondary | ICD-10-CM | POA: Diagnosis not present

## 2014-07-15 DIAGNOSIS — E1140 Type 2 diabetes mellitus with diabetic neuropathy, unspecified: Secondary | ICD-10-CM | POA: Diagnosis not present

## 2014-07-15 DIAGNOSIS — G5600 Carpal tunnel syndrome, unspecified upper limb: Secondary | ICD-10-CM | POA: Diagnosis not present

## 2014-07-15 DIAGNOSIS — M1990 Unspecified osteoarthritis, unspecified site: Secondary | ICD-10-CM | POA: Diagnosis not present

## 2014-08-11 DIAGNOSIS — I10 Essential (primary) hypertension: Secondary | ICD-10-CM | POA: Diagnosis not present

## 2014-08-11 DIAGNOSIS — E119 Type 2 diabetes mellitus without complications: Secondary | ICD-10-CM | POA: Diagnosis not present

## 2014-08-11 DIAGNOSIS — G5630 Lesion of radial nerve, unspecified upper limb: Secondary | ICD-10-CM | POA: Diagnosis not present

## 2014-08-14 DIAGNOSIS — G5610 Other lesions of median nerve, unspecified upper limb: Secondary | ICD-10-CM | POA: Diagnosis not present

## 2014-08-14 DIAGNOSIS — M79603 Pain in arm, unspecified: Secondary | ICD-10-CM | POA: Diagnosis not present

## 2014-11-12 DIAGNOSIS — E1165 Type 2 diabetes mellitus with hyperglycemia: Secondary | ICD-10-CM | POA: Diagnosis not present

## 2014-11-12 DIAGNOSIS — G5600 Carpal tunnel syndrome, unspecified upper limb: Secondary | ICD-10-CM | POA: Diagnosis not present

## 2014-11-12 DIAGNOSIS — I10 Essential (primary) hypertension: Secondary | ICD-10-CM | POA: Diagnosis not present

## 2014-11-12 DIAGNOSIS — Z23 Encounter for immunization: Secondary | ICD-10-CM | POA: Diagnosis not present

## 2014-12-25 ENCOUNTER — Emergency Department (HOSPITAL_COMMUNITY): Payer: Medicare Other

## 2014-12-25 ENCOUNTER — Encounter (HOSPITAL_COMMUNITY): Payer: Self-pay | Admitting: *Deleted

## 2014-12-25 ENCOUNTER — Inpatient Hospital Stay (HOSPITAL_COMMUNITY)
Admission: EM | Admit: 2014-12-25 | Discharge: 2014-12-29 | DRG: 872 | Disposition: A | Discharge status: Home Health | Payer: Medicare Other | Attending: Pulmonary Disease | Admitting: Pulmonary Disease

## 2014-12-25 DIAGNOSIS — R279 Unspecified lack of coordination: Secondary | ICD-10-CM | POA: Diagnosis not present

## 2014-12-25 DIAGNOSIS — R740 Nonspecific elevation of levels of transaminase and lactic acid dehydrogenase [LDH]: Secondary | ICD-10-CM | POA: Diagnosis not present

## 2014-12-25 DIAGNOSIS — D72829 Elevated white blood cell count, unspecified: Secondary | ICD-10-CM

## 2014-12-25 DIAGNOSIS — M25551 Pain in right hip: Secondary | ICD-10-CM | POA: Diagnosis not present

## 2014-12-25 DIAGNOSIS — Z86711 Personal history of pulmonary embolism: Secondary | ICD-10-CM | POA: Diagnosis not present

## 2014-12-25 DIAGNOSIS — M79606 Pain in leg, unspecified: Secondary | ICD-10-CM | POA: Diagnosis not present

## 2014-12-25 DIAGNOSIS — R531 Weakness: Secondary | ICD-10-CM

## 2014-12-25 DIAGNOSIS — E119 Type 2 diabetes mellitus without complications: Secondary | ICD-10-CM | POA: Diagnosis present

## 2014-12-25 DIAGNOSIS — Z7901 Long term (current) use of anticoagulants: Secondary | ICD-10-CM | POA: Diagnosis not present

## 2014-12-25 DIAGNOSIS — A419 Sepsis, unspecified organism: Principal | ICD-10-CM | POA: Diagnosis present

## 2014-12-25 DIAGNOSIS — Z6841 Body Mass Index (BMI) 40.0 and over, adult: Secondary | ICD-10-CM

## 2014-12-25 DIAGNOSIS — R7989 Other specified abnormal findings of blood chemistry: Secondary | ICD-10-CM

## 2014-12-25 DIAGNOSIS — R404 Transient alteration of awareness: Secondary | ICD-10-CM | POA: Diagnosis not present

## 2014-12-25 DIAGNOSIS — Z86718 Personal history of other venous thrombosis and embolism: Secondary | ICD-10-CM

## 2014-12-25 DIAGNOSIS — Z8249 Family history of ischemic heart disease and other diseases of the circulatory system: Secondary | ICD-10-CM | POA: Diagnosis not present

## 2014-12-25 DIAGNOSIS — L03115 Cellulitis of right lower limb: Secondary | ICD-10-CM | POA: Diagnosis present

## 2014-12-25 DIAGNOSIS — L0390 Cellulitis, unspecified: Secondary | ICD-10-CM | POA: Diagnosis present

## 2014-12-25 DIAGNOSIS — M25561 Pain in right knee: Secondary | ICD-10-CM | POA: Diagnosis not present

## 2014-12-25 DIAGNOSIS — M25562 Pain in left knee: Secondary | ICD-10-CM | POA: Diagnosis not present

## 2014-12-25 DIAGNOSIS — R32 Unspecified urinary incontinence: Secondary | ICD-10-CM | POA: Diagnosis present

## 2014-12-25 DIAGNOSIS — Z833 Family history of diabetes mellitus: Secondary | ICD-10-CM

## 2014-12-25 DIAGNOSIS — M25552 Pain in left hip: Secondary | ICD-10-CM | POA: Diagnosis not present

## 2014-12-25 DIAGNOSIS — D649 Anemia, unspecified: Secondary | ICD-10-CM | POA: Diagnosis not present

## 2014-12-25 DIAGNOSIS — R509 Fever, unspecified: Secondary | ICD-10-CM | POA: Diagnosis present

## 2014-12-25 DIAGNOSIS — I10 Essential (primary) hypertension: Secondary | ICD-10-CM

## 2014-12-25 DIAGNOSIS — Z7401 Bed confinement status: Secondary | ICD-10-CM | POA: Diagnosis not present

## 2014-12-25 DIAGNOSIS — E118 Type 2 diabetes mellitus with unspecified complications: Secondary | ICD-10-CM

## 2014-12-25 DIAGNOSIS — E6609 Other obesity due to excess calories: Secondary | ICD-10-CM | POA: Diagnosis not present

## 2014-12-25 DIAGNOSIS — L02419 Cutaneous abscess of limb, unspecified: Secondary | ICD-10-CM | POA: Diagnosis not present

## 2014-12-25 LAB — COMPREHENSIVE METABOLIC PANEL
ALBUMIN: 3.3 g/dL — AB (ref 3.5–5.0)
ALT: 15 U/L (ref 14–54)
AST: 23 U/L (ref 15–41)
Alkaline Phosphatase: 52 U/L (ref 38–126)
Anion gap: 12 (ref 5–15)
BUN: 22 mg/dL — AB (ref 6–20)
CHLORIDE: 105 mmol/L (ref 101–111)
CO2: 22 mmol/L (ref 22–32)
CREATININE: 1.01 mg/dL — AB (ref 0.44–1.00)
Calcium: 9.2 mg/dL (ref 8.9–10.3)
GFR calc Af Amer: >60 mL/min (ref >60)
GFR, EST NON AFRICAN AMERICAN: 58 mL/min — AB (ref >60)
GLUCOSE: 226 mg/dL — AB (ref 65–99)
POTASSIUM: 4.7 mmol/L (ref 3.5–5.1)
SODIUM: 139 mmol/L (ref 135–145)
Total Bilirubin: 0.6 mg/dL (ref 0.3–1.2)
Total Protein: 7.3 g/dL (ref 6.5–8.1)

## 2014-12-25 LAB — CBC WITH DIFFERENTIAL/PLATELET
BASOS ABS: 0 10*3/uL (ref 0.0–0.1)
Basophils Relative: 0 %
EOS PCT: 0 %
Eosinophils Absolute: 0 10*3/uL (ref 0.0–0.7)
HCT: 36.5 % (ref 36.0–46.0)
Hemoglobin: 11.2 g/dL — ABNORMAL LOW (ref 12.0–15.0)
LYMPHS ABS: 0.6 10*3/uL — AB (ref 0.7–4.0)
LYMPHS PCT: 3 %
MCH: 25.9 pg — ABNORMAL LOW (ref 26.0–34.0)
MCHC: 30.7 g/dL (ref 30.0–36.0)
MCV: 84.5 fL (ref 78.0–100.0)
MONOS PCT: 4 %
Monocytes Absolute: 0.7 10*3/uL (ref 0.1–1.0)
Neutro Abs: 16.2 10*3/uL — ABNORMAL HIGH (ref 1.7–7.7)
Neutrophils Relative %: 93 %
PLATELETS: 220 10*3/uL (ref 150–400)
RBC: 4.32 MIL/uL (ref 3.87–5.11)
RDW: 14.7 % (ref 11.5–15.5)
WBC: 17.5 10*3/uL — AB (ref 4.0–10.5)

## 2014-12-25 LAB — GLUCOSE, CAPILLARY: Glucose-Capillary: 147 mg/dL — ABNORMAL HIGH (ref 65–99)

## 2014-12-25 LAB — I-STAT CG4 LACTIC ACID, ED
LACTIC ACID, VENOUS: 2.64 mmol/L — AB (ref 0.5–2.0)
Lactic Acid, Venous: 1.81 mmol/L (ref 0.5–2.0)

## 2014-12-25 LAB — CK: Total CK: 116 U/L (ref 38–234)

## 2014-12-25 LAB — URINALYSIS, ROUTINE W REFLEX MICROSCOPIC
BILIRUBIN URINE: NEGATIVE
Glucose, UA: NEGATIVE mg/dL
HGB URINE DIPSTICK: NEGATIVE
KETONES UR: NEGATIVE mg/dL
Leukocytes, UA: NEGATIVE
Nitrite: NEGATIVE
Protein, ur: 100 mg/dL — AB
Specific Gravity, Urine: >1.03 — ABNORMAL HIGH (ref 1.005–1.030)
UROBILINOGEN UA: 0.2 mg/dL (ref 0.0–1.0)
pH: 5.5 (ref 5.0–8.0)

## 2014-12-25 LAB — URINE MICROSCOPIC-ADD ON

## 2014-12-25 LAB — LACTIC ACID, PLASMA
LACTIC ACID, VENOUS: 1.2 mmol/L (ref 0.5–2.0)
Lactic Acid, Venous: 0.8 mmol/L (ref 0.5–2.0)

## 2014-12-25 LAB — PROCALCITONIN: Procalcitonin: 8.09 ng/mL

## 2014-12-25 LAB — PROTIME-INR
INR: 1.47 (ref 0.00–1.49)
PROTHROMBIN TIME: 17.9 s — AB (ref 11.6–15.2)

## 2014-12-25 LAB — TROPONIN I: TROPONIN I: 0.03 ng/mL (ref <0.031)

## 2014-12-25 LAB — APTT: APTT: 30 s (ref 24–37)

## 2014-12-25 MED ORDER — ACETAMINOPHEN 650 MG RE SUPP: 650.0000 mg | Freq: Four times a day (QID) | RECTAL | Status: DC | PRN

## 2014-12-25 MED ORDER — VANCOMYCIN HCL IN DEXTROSE 1-5 GM/200ML-% IV SOLN
INTRAVENOUS | Status: AC
  Filled 2014-12-25: qty 200

## 2014-12-25 MED ORDER — SODIUM CHLORIDE 0.9 % IV BOLUS (SEPSIS)
1000.0000 mL | Freq: Once | INTRAVENOUS | Status: AC
  Administered 2014-12-25: 1000 mL via INTRAVENOUS

## 2014-12-25 MED ORDER — VERAPAMIL HCL ER 240 MG PO TBCR
240.0000 mg | EXTENDED_RELEASE_TABLET | Freq: Every day | ORAL | Status: DC
  Administered 2014-12-26 – 2014-12-29 (×4): 240 mg via ORAL
  Filled 2014-12-25 (×4): qty 1

## 2014-12-25 MED ORDER — PIPERACILLIN-TAZOBACTAM 3.375 G IVPB 30 MIN
3.3750 g | Freq: Once | INTRAVENOUS | Status: AC
  Administered 2014-12-25: 3.375 g via INTRAVENOUS
  Filled 2014-12-25: qty 50

## 2014-12-25 MED ORDER — TRANDOLAPRIL 2 MG PO TABS
4.0000 mg | ORAL_TABLET | Freq: Every day | ORAL | Status: DC
  Administered 2014-12-26 – 2014-12-29 (×4): 4 mg via ORAL
  Filled 2014-12-25 (×4): qty 2

## 2014-12-25 MED ORDER — VANCOMYCIN HCL IN DEXTROSE 1-5 GM/200ML-% IV SOLN: 1000.0000 mg | Freq: Once | INTRAVENOUS | Status: DC

## 2014-12-25 MED ORDER — RIVAROXABAN 20 MG PO TABS
20.0000 mg | ORAL_TABLET | Freq: Every day | ORAL | Status: DC
  Administered 2014-12-25 – 2014-12-28 (×4): 20 mg via ORAL
  Filled 2014-12-25 (×4): qty 1

## 2014-12-25 MED ORDER — VANCOMYCIN HCL 10 G IV SOLR
1250.0000 mg | Freq: Two times a day (BID) | INTRAVENOUS | Status: DC
  Administered 2014-12-26 – 2014-12-27 (×4): 1250 mg via INTRAVENOUS
  Filled 2014-12-25 (×5): qty 1250

## 2014-12-25 MED ORDER — SODIUM CHLORIDE 0.9 % IJ SOLN
3.0000 mL | Freq: Two times a day (BID) | INTRAMUSCULAR | Status: DC
  Administered 2014-12-26 – 2014-12-27 (×3): 3 mL via INTRAVENOUS

## 2014-12-25 MED ORDER — PIPERACILLIN-TAZOBACTAM 3.375 G IVPB 30 MIN: 3.3750 g | Freq: Once | INTRAVENOUS | Status: DC

## 2014-12-25 MED ORDER — ONDANSETRON HCL 4 MG PO TABS: 4.0000 mg | ORAL_TABLET | Freq: Four times a day (QID) | ORAL | Status: DC | PRN

## 2014-12-25 MED ORDER — INSULIN ASPART 100 UNIT/ML ~~LOC~~ SOLN
0.0000 [IU] | Freq: Three times a day (TID) | SUBCUTANEOUS | Status: DC
  Administered 2014-12-26 (×2): 2 [IU] via SUBCUTANEOUS
  Administered 2014-12-27: 3 [IU] via SUBCUTANEOUS
  Administered 2014-12-27 – 2014-12-28 (×2): 2 [IU] via SUBCUTANEOUS
  Administered 2014-12-28: 3 [IU] via SUBCUTANEOUS
  Administered 2014-12-29: 2 [IU] via SUBCUTANEOUS

## 2014-12-25 MED ORDER — VANCOMYCIN HCL IN DEXTROSE 1-5 GM/200ML-% IV SOLN
1000.0000 mg | Freq: Once | INTRAVENOUS | Status: AC
  Administered 2014-12-25: 1000 mg via INTRAVENOUS
  Filled 2014-12-25: qty 200

## 2014-12-25 MED ORDER — INSULIN ASPART 100 UNIT/ML ~~LOC~~ SOLN: 0.0000 [IU] | Freq: Every day | SUBCUTANEOUS | Status: DC

## 2014-12-25 MED ORDER — ACETAMINOPHEN 325 MG PO TABS
650.0000 mg | ORAL_TABLET | Freq: Once | ORAL | Status: AC
  Administered 2014-12-25: 650 mg via ORAL
  Filled 2014-12-25: qty 2

## 2014-12-25 MED ORDER — SODIUM CHLORIDE 0.9 % IV BOLUS (SEPSIS)
500.0000 mL | INTRAVENOUS | Status: AC
  Administered 2014-12-25: 500 mL via INTRAVENOUS

## 2014-12-25 MED ORDER — ONDANSETRON HCL 4 MG/2ML IJ SOLN: 4.0000 mg | Freq: Four times a day (QID) | INTRAMUSCULAR | Status: DC | PRN

## 2014-12-25 MED ORDER — PIPERACILLIN-TAZOBACTAM 3.375 G IVPB
INTRAVENOUS | Status: AC
  Filled 2014-12-25: qty 100

## 2014-12-25 MED ORDER — SODIUM CHLORIDE 0.9 % IV BOLUS (SEPSIS)
1000.0000 mL | INTRAVENOUS | Status: AC
  Administered 2014-12-25 – 2014-12-26 (×3): 1000 mL via INTRAVENOUS

## 2014-12-25 MED ORDER — ACETAMINOPHEN 325 MG PO TABS: 650.0000 mg | ORAL_TABLET | Freq: Four times a day (QID) | ORAL | Status: DC | PRN

## 2014-12-25 MED ORDER — OXYCODONE-ACETAMINOPHEN 5-325 MG PO TABS
1.0000 | ORAL_TABLET | Freq: Four times a day (QID) | ORAL | Status: DC
  Administered 2014-12-25 – 2014-12-29 (×15): 1 via ORAL
  Filled 2014-12-25 (×16): qty 1

## 2014-12-25 MED ORDER — PIPERACILLIN-TAZOBACTAM 3.375 G IVPB
3.3750 g | Freq: Three times a day (TID) | INTRAVENOUS | Status: DC
  Administered 2014-12-26 – 2014-12-29 (×11): 3.375 g via INTRAVENOUS
  Filled 2014-12-25 (×14): qty 50

## 2014-12-25 NOTE — ED Notes (Signed)
States she slipped up this am in the hall at home and was unable to get up. Pain in both legs, history of lymphedema

## 2014-12-25 NOTE — ED Provider Notes (Signed)
CSN: [id number]     Arrival date & time 12/25/14  1150 History   First MD Initiated Contact with Patient 12/25/14 1202     Chief Complaint  Patient presents with  . Fall     (Consider location/radiation/quality/duration/timing/severity/associated sxs/prior Treatment) Patient is a 64 y.o. female presenting with fall. The history is provided by the patient.  Fall  She was walking to the bathroom when her legs gave out on her. She denies actually falling. She denies any injury when she went down. She was unable to get back up. EMS arrived and did assist her to her feet and she was able to stand with a walker, but there was pain in her legs. She has chronic edema in her legs and chronic pain in her legs which he states is no worse now than it normally is. She denies hitting her head. She is anticoagulated on rivaroxaban for history of pulmonary embolism. She denies any cough or nausea or vomiting. She denies chest pain. She denies dysuria but does state that she sometimes has urinary urgency. She also has urge incontinence on occasion. She denies melena.  Past Medical History  Diagnosis Date  . Essential hypertension, benign   . Type 2 diabetes mellitus ([place])   . Skin ulcer of thigh ([place])   . Pulmonary emboli ([place]) 2009  . History of MRSA infection   . History of panniculitis    Past Surgical History  Procedure Laterality Date  . Partial hysterectomy    . Right thigh ulcer debridement  2008    Two occasions - wound VAC   Family History  Problem Relation Age of Onset  . Cancer - Colon Father   . Heart disease Mother   . Diabetes Mellitus II Mother    Social History  Substance Use Topics  . Smoking status: Never Smoker   . Smokeless tobacco: None  . Alcohol Use: No   OB History    No data available     Review of Systems  All other systems reviewed and are negative.     Allergies  Review of patient's allergies indicates no known allergies.  Home Medications   Prior  to Admission medications   Medication Sig Start Date End Date Taking? Authorizing Provider  CELEBREX 200 MG capsule Take 200 mg by mouth daily.  10/13/11   Historical Provider, MD  glyBURIDE-metformin (GLUCOVANCE) 5-500 MG per tablet Take 1 tablet by mouth 3 (three) times daily.  11/24/11   Historical Provider, MD  oxyCODONE-acetaminophen (PERCOCET/ROXICET) 5-325 MG per tablet Take 1 tablet by mouth 4 (four) times daily.  11/23/11   Historical Provider, MD  trandolapril (MAVIK) 4 MG tablet Take 4 mg by mouth daily.  11/24/11   Historical Provider, MD  verapamil (CALAN-SR) 240 MG CR tablet Take 240 mg by mouth daily.  10/24/11   Historical Provider, MD  XARELTO 20 MG TABS Take 20 mg by mouth daily.  11/29/11   Historical Provider, MD   BP 130/74 mmHg  Pulse 123  Temp(Src) 101.3 F (38.5 C) (Oral)  Resp 20  Ht 5\' 6"  (1.676 m)  Wt 324 lb (146.965 kg)  BMI 52.32 kg/m2  SpO2 97% Physical Exam  Nursing note and vitals reviewed.  Morbidly obese 64 year old female, resting comfortably and in no acute distress. Vital signs are significant for fever and tachycardia. Oxygen saturation is 97%, which is normal. Head is normocephalic and atraumatic. PERRLA, EOMI. Oropharynx is clear. Conjunctivae are pink. Neck is nontender and  supple without adenopathy or JVD. Back is nontender and there is no CVA tenderness. Lungs are clear without rales, wheezes, or rhonchi. Chest is nontender. Heart has regular rate and rhythm with 2/6 systolic ejection murmur heard at the cardiac base with radiation to the neck. Abdomen is soft, flat, nontender without masses or hepatosplenomegaly and peristalsis is normoactive. Extremities have 1+ pitting edema with underlying lymphedema. Pain is elicited with passive range of motion in both knees and hips and there is tenderness to palpation over both knees and hips. Skin is warm and dry without rash. Neurologic: Mental status is normal, cranial nerves are intact, there are no motor  or sensory deficits.  ED Course  Procedures (including critical care time) Labs Review Results for orders placed or performed during the hospital encounter of 12/25/14  Culture, blood (routine x 2)  Result Value Ref Range   Specimen Description BLOOD RIGHT ARM    Special Requests BOTTLES DRAWN AEROBIC AND ANAEROBIC 8 CC EACH    Culture PENDING    Report Status PENDING   Culture, blood (routine x 2)  Result Value Ref Range   Specimen Description BLOOD LEFT ARM    Special Requests BOTTLES DRAWN AEROBIC AND ANAEROBIC 8 CC EACH    Culture PENDING    Report Status PENDING   Comprehensive metabolic panel  Result Value Ref Range   Sodium 139 135 - 145 mmol/L   Potassium 4.7 3.5 - 5.1 mmol/L   Chloride 105 101 - 111 mmol/L   CO2 22 22 - 32 mmol/L   Glucose, Bld 226 (H) 65 - 99 mg/dL   BUN 22 (H) 6 - 20 mg/dL   Creatinine, Ser 1.01 (H) 0.44 - 1.00 mg/dL   Calcium 9.2 8.9 - 10.3 mg/dL   Total Protein 7.3 6.5 - 8.1 g/dL   Albumin 3.3 (L) 3.5 - 5.0 g/dL   AST 23 15 - 41 U/L   ALT 15 14 - 54 U/L   Alkaline Phosphatase 52 38 - 126 U/L   Total Bilirubin 0.6 0.3 - 1.2 mg/dL   GFR calc non Af Amer 58 (L) >60 mL/min   GFR calc Af Amer >60 >60 mL/min   Anion gap 12 5 - 15  CBC with Differential  Result Value Ref Range   WBC 17.5 (H) 4.0 - 10.5 K/uL   RBC 4.32 3.87 - 5.11 MIL/uL   Hemoglobin 11.2 (L) 12.0 - 15.0 g/dL   HCT 36.5 36.0 - 46.0 %   MCV 84.5 78.0 - 100.0 fL   MCH 25.9 (L) 26.0 - 34.0 pg   MCHC 30.7 30.0 - 36.0 g/dL   RDW 14.7 11.5 - 15.5 %   Platelets 220 150 - 400 K/uL   Neutrophils Relative % 93 %   Neutro Abs 16.2 (H) 1.7 - 7.7 K/uL   Lymphocytes Relative 3 %   Lymphs Abs 0.6 (L) 0.7 - 4.0 K/uL   Monocytes Relative 4 %   Monocytes Absolute 0.7 0.1 - 1.0 K/uL   Eosinophils Relative 0 %   Eosinophils Absolute 0.0 0.0 - 0.7 K/uL   Basophils Relative 0 %   Basophils Absolute 0.0 0.0 - 0.1 K/uL  Urinalysis, Routine w reflex microscopic  Result Value Ref Range   Color,  Urine YELLOW YELLOW   APPearance CLEAR CLEAR   Specific Gravity, Urine >1.030 (H) 1.005 - 1.030   pH 5.5 5.0 - 8.0   Glucose, UA NEGATIVE NEGATIVE mg/dL   Hgb urine dipstick NEGATIVE NEGATIVE  Bilirubin Urine NEGATIVE NEGATIVE   Ketones, ur NEGATIVE NEGATIVE mg/dL   Protein, ur 100 (A) NEGATIVE mg/dL   Urobilinogen, UA 0.2 0.0 - 1.0 mg/dL   Nitrite NEGATIVE NEGATIVE   Leukocytes, UA NEGATIVE NEGATIVE  CK  Result Value Ref Range   Total CK 116 38 - 234 U/L  Troponin I  Result Value Ref Range   Troponin I 0.03 <0.031 ng/mL  Urine microscopic-add on  Result Value Ref Range   Squamous Epithelial / LPF RARE RARE   Bacteria, UA FEW (A) RARE  I-Stat CG4 Lactic Acid, ED  Result Value Ref Range   Lactic Acid, Venous 2.64 (HH) 0.5 - 2.0 mmol/L   Comment NOTIFIED PHYSICIAN    Imaging Review Dg Chest 1 View  12/25/2014  CLINICAL DATA:  Fever EXAM: CHEST 1 VIEW COMPARISON:  March 18, 2007 FINDINGS: There is no edema or consolidation. Heart is upper normal in size with pulmonary vascularity within normal limits. No adenopathy. No pneumothorax. No bone lesions. IMPRESSION: No edema or consolidation. Electronically Signed   By: Lowella Grip III M.D.   On: 12/25/2014 14:01   Dg Knee Complete 4 Views Left  12/25/2014  CLINICAL DATA:  Fever.  Knee pain. EXAM: LEFT KNEE - COMPLETE 4+ VIEW COMPARISON:  None. FINDINGS: No joint effusion identified. Severe tricompartment osteoarthritis is noted with marked medial compartment and patellofemoral compartment narrowing and marginal spur formation. No fractures or subluxations identified. No radiopaque foreign bodies are soft tissue calcifications. IMPRESSION: 1. Severe osteoarthritis. Electronically Signed   By: Kerby Moors M.D.   On: 12/25/2014 14:05   Dg Knee Complete 4 Views Right  12/25/2014  CLINICAL DATA:  Pain following fall EXAM: RIGHT KNEE - COMPLETE 4+ VIEW COMPARISON:  December 26, 2004 FINDINGS: Frontal, bilateral oblique, and lateral  views were obtained. There is no fracture or dislocation. No joint effusion. There is marked narrowing medially with virtually complete loss of disc space medially. There is moderate narrowing in the patellofemoral joint with slightly less narrowing laterally. There is spurring in all compartments, most marked medially. There are multiple foci of arterial vascular calcification. No erosive change. IMPRESSION: Extensive osteoarthritic change, most marked medially. No fracture or effusion. Extensive arterial vascular calcification present. Electronically Signed   By: Lowella Grip III M.D.   On: 12/25/2014 14:05   Dg Hips Bilat With Pelvis 3-4 Views  12/25/2014  CLINICAL DATA:  64 year old who spotted and fell onto the floor earlier today, complaining of pelvic and hip pain. Initial encounter. EXAM: DG HIP (WITH OR WITHOUT PELVIS) 3-4V BILAT COMPARISON:  None. FINDINGS: No acute fractures identified. Hip joints intact with symmetric moderate to severe inferomedial joint space narrowing. Sacroiliac joints and symphysis pubis intact. Mild osseous demineralization. IMPRESSION: 1. No acute osseous abnormality. 2. 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 Electronically Signed   By: Evangeline Dakin M.D.   On: 12/25/2014 14:04   I have personally reviewed and evaluated these images and lab results as part of my medical decision-making.   EKG Interpretation   Date/Time:  Friday December 25 2014 12:21:56 EDT Ventricular Rate:  110 PR Interval:  140 QRS Duration: 81 QT Interval:  306 QTC Calculation: 414 R Axis:   -20 Text Interpretation:  Sinus tachycardia Probable left atrial enlargement  Borderline left axis deviation When compared with ECG of 01/09/2012,  Premature atrial complexes are no longer Present Confirmed by Roxanne Mins  MD,  Anthone Prieur ([id number]) on 12/25/2014 12:28:43 PM      MDM  Final diagnoses:  Fever, unspecified  Elevated lactic acid level  Leukocytosis  Normochromic  normocytic anemia  Weakness    Weakness with fever. Leg pain I feel is likely chronic and unlikely due to trauma but x-rays will be obtained. Screen for sepsis is initiated as well as evaluation for source of fever and she was started on acetaminophen and fluids. Old records are reviewed and she has no relevant past visits. It is noted that she is on a Cox 2 inhibitor along with systemic anticoagulation, but exam does not suggest significant anemia.  Laboratory workup does show elevated lactic acid and elevated WBC with left shift as well as stable anemia. No evidence of urinary tract infection and a clear chest x-ray. No obvious source of infection. However, with leukocytosis and elevated lactic acid, she is being treated for presumed bacterial infection. She does not appear toxic and is already fluid overloaded so I am hesitant to give her the full 30 mL/kg for early goal-directed therapy. Heart rate has come down with a single 1 L bolus and will recheck a lactic acid. She is started on antibiotics worse sepsis of undetermined source and given vancomycin and Zosyn. Case is discussed with Dr. Roderic Palau of [facility] agrees to admit the patient. Also, creatinine has risen above baseline although is not clearly abnormal. This will need to be followed closely.  Delora Fuel, MD 36/46/80 3212

## 2014-12-25 NOTE — H&P (Signed)
[facility] History and Physical  Solana Coggin Halabi HUT:[id number] DOB: 1950/07/09 DOA: 12/25/2014  Referring physician: Dr. Roxanne Mins, ER PCP: Alonza Bogus, MD   Chief Complaint: fever  HPI: [name] is a 64 y.o. female with multiple medical problems including hypertension, diabetes, morbid obesity and chronic lymphedema of lower extremities. Patient reports that she was in her usual state of health when this morning she woke up with chills. She checked her temperature and was found have a fever of 103. She attempted to ambulate to the bathroom when she felt her legs give out. She did not admit to losing consciousness or any head injury. She felt increasingly weak. She has not been short of breath, she has had no cough. She had one episode of vomiting this morning. Denies any diarrhea. Denies any dysuria, headache or neck pain. She does admit to having a wound on her right medial thigh for several weeks now. This is more recently been draining. She was evaluated in emergency room and noted to be febrile with temperature 101.3, tachycardic and had a leukocytosis. She is being admitted for further treatments.   Review of Systems:  Pertinent positives as per HPI, otherwise negative  Past Medical History  Diagnosis Date  . Essential hypertension, benign   . Type 2 diabetes mellitus ([place])   . Skin ulcer of thigh ([place])   . Pulmonary emboli ([place]) 2009  . History of MRSA infection   . History of panniculitis    Past Surgical History  Procedure Laterality Date  . Partial hysterectomy    . Right thigh ulcer debridement  2008    Two occasions - wound VAC   Social History:  reports that she has never smoked. She does not have any smokeless tobacco history on file. She reports that she does not drink alcohol or use illicit drugs.  No Known Allergies  Family History  Problem Relation Age of Onset  . Cancer - Colon Father   . Heart disease Mother   . Diabetes Mellitus II Mother      Prior to Admission medications   Medication Sig Start Date End Date Taking? Authorizing Provider  CELEBREX 200 MG capsule Take 200 mg by mouth daily.  10/13/11  Yes Historical Provider, MD  glyBURIDE-metformin (GLUCOVANCE) 5-500 MG per tablet Take 1 tablet by mouth 3 (three) times daily.  11/24/11  Yes Historical Provider, MD  oxyCODONE-acetaminophen (PERCOCET/ROXICET) 5-325 MG per tablet Take 1 tablet by mouth 4 (four) times daily.  11/23/11  Yes Historical Provider, MD  trandolapril (MAVIK) 4 MG tablet Take 4 mg by mouth daily.  11/24/11  Yes Historical Provider, MD  verapamil (CALAN-SR) 240 MG CR tablet Take 240 mg by mouth daily.  10/24/11  Yes Historical Provider, MD  XARELTO 20 MG TABS Take 20 mg by mouth daily with supper.  11/29/11  Yes Historical Provider, MD   Physical Exam: Filed Vitals:   12/25/14 1255 12/25/14 1417 12/25/14 1606 12/25/14 1640  BP:  130/109 140/62 178/50  Pulse:  96 103 103  Temp: 101.3 F (38.5 C)  100.2 F (37.9 C) 99.8 F (37.7 C)  TempSrc:    Oral  Resp:  18 18 20   Height:    5\' 6"  (1.676 m)  Weight:    147.8 kg (325 lb 13.4 oz)  SpO2:  97% 98% 98%    Wt Readings from Last 3 Encounters:  12/25/14 147.8 kg (325 lb 13.4 oz)    General:  Appears calm and comfortable Eyes:  PERRL, normal lids, irises & conjunctiva ENT: grossly normal hearing, lips & tongue Neck: no LAD, masses or thyromegaly Cardiovascular: mildly tachycardic, no m/r/g. Lymphedema noted in both lower extremity Telemetry: SR, no arrhythmias  Respiratory: CTA bilaterally, no w/r/r. Normal respiratory effort. Abdomen: soft, obese, bs+ Skin: foul smelling, draining wound noted on medial aspect of right thigh Musculoskeletal: grossly normal tone BUE/BLE Psychiatric: grossly normal mood and affect, speech fluent and appropriate Neurologic: grossly non-focal.          Labs on Admission:  Basic Metabolic Panel:  Recent Labs Lab 12/25/14 1253  NA 139  K 4.7  CL 105  CO2 22   GLUCOSE 226*  BUN 22*  CREATININE 1.01*  CALCIUM 9.2   Liver Function Tests:  Recent Labs Lab 12/25/14 1253  AST 23  ALT 15  ALKPHOS 52  BILITOT 0.6  PROT 7.3  ALBUMIN 3.3*   No results for input(s): LIPASE, AMYLASE in the last 168 hours. No results for input(s): AMMONIA in the last 168 hours. CBC:  Recent Labs Lab 12/25/14 1253  WBC 17.5*  NEUTROABS 16.2*  HGB 11.2*  HCT 36.5  MCV 84.5  PLT 220   Cardiac Enzymes:  Recent Labs Lab 12/25/14 1253  CKTOTAL 116  TROPONINI 0.03    BNP (last 3 results) No results for input(s): BNP in the last 8760 hours.  ProBNP (last 3 results) No results for input(s): PROBNP in the last 8760 hours.  CBG: No results for input(s): GLUCAP in the last 168 hours.  Radiological Exams on Admission: Dg Chest 1 View  12/25/2014  CLINICAL DATA:  Fever EXAM: CHEST 1 VIEW COMPARISON:  March 18, 2007 FINDINGS: There is no edema or consolidation. Heart is upper normal in size with pulmonary vascularity within normal limits. No adenopathy. No pneumothorax. No bone lesions. IMPRESSION: No edema or consolidation. Electronically Signed   By: Lowella Grip III M.D.   On: 12/25/2014 14:01   Dg Knee Complete 4 Views Left  12/25/2014  CLINICAL DATA:  Fever.  Knee pain. EXAM: LEFT KNEE - COMPLETE 4+ VIEW COMPARISON:  None. FINDINGS: No joint effusion identified. Severe tricompartment osteoarthritis is noted with marked medial compartment and patellofemoral compartment narrowing and marginal spur formation. No fractures or subluxations identified. No radiopaque foreign bodies are soft tissue calcifications. IMPRESSION: 1. Severe osteoarthritis. Electronically Signed   By: Kerby Moors M.D.   On: 12/25/2014 14:05   Dg Knee Complete 4 Views Right  12/25/2014  CLINICAL DATA:  Pain following fall EXAM: RIGHT KNEE - COMPLETE 4+ VIEW COMPARISON:  December 26, 2004 FINDINGS: Frontal, bilateral oblique, and lateral views were obtained. There is no  fracture or dislocation. No joint effusion. There is marked narrowing medially with virtually complete loss of disc space medially. There is moderate narrowing in the patellofemoral joint with slightly less narrowing laterally. There is spurring in all compartments, most marked medially. There are multiple foci of arterial vascular calcification. No erosive change. IMPRESSION: Extensive osteoarthritic change, most marked medially. No fracture or effusion. Extensive arterial vascular calcification present. Electronically Signed   By: Lowella Grip III M.D.   On: 12/25/2014 14:05   Dg Hips Bilat With Pelvis 3-4 Views  12/25/2014  CLINICAL DATA:  64 year old who spotted and fell onto the floor earlier today, complaining of pelvic and hip pain. Initial encounter. EXAM: DG HIP (WITH OR WITHOUT PELVIS) 3-4V BILAT COMPARISON:  None. FINDINGS: No acute fractures identified. Hip joints intact with symmetric moderate to severe inferomedial joint space narrowing.  Sacroiliac joints and symphysis pubis intact. Mild osseous demineralization. IMPRESSION: 1. No acute osseous abnormality. 2. 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 Electronically Signed   By: Evangeline Dakin M.D.   On: 12/25/2014 14:04    EKG: Independently reviewed. sinus tachycardia  Assessment/Plan Active Problems:   Type 2 diabetes mellitus (H[facility])   Essential hypertension, benign   Morbid obesity (H[facility])   Fever, unspecified   Sepsis ([place])   Cellulitis   1. Sepsis. Likely related to cellulitis. She received IV fluids per sepsis protocol. Blood cultures will be sent. She'll be started on intravenous antibiotics. Lactic acid was elevated on admission, but improving with hydration. 2. Cellulitis of right medial thigh. Patient has significant draining wound on her right medial thigh with foul-smelling drainage. Will request wound care consult. Continue intravenous antibiotics. 3. Hypertension. Blood pressure is currently  stable. We'll continue her outpatient regimen. 4. Diabetes. Hold oral hypoglycemics. His isoenzymes. Check A1c. 5. Morbid obesity. 6. History of pulmonary emboli. Patient is anticoagulated with Xarelto  Code Status: full code DVT Prophylaxis: xarelto Family Communication: discussed with patient Disposition Plan: discharge home once improved  Time spent: 30mins  Fynlee Rowlands [facility] Pager [phone]

## 2014-12-25 NOTE — Progress Notes (Signed)
ANTIBIOTIC CONSULT NOTE - INITIAL  Pharmacy Consult for Vancomycin & Zosyn Indication: cellulitis  No Known Allergies  Patient Measurements: Height: 5\' 6"  (167.6 cm) Weight: (!) 325 lb 13.4 oz (147.8 kg) IBW/kg (Calculated) : 59.3 Adjusted Body Weight:   Vital Signs: Temp: 99.8 F (37.7 C) (11/04 1640) Temp Source: Oral (11/04 1640) BP: 178/50 mmHg (11/04 1640) Pulse Rate: 103 (11/04 1640) Intake/Output from previous day:   Intake/Output from this shift:    Labs:  Recent Labs  12/25/14 1253  WBC 17.5*  HGB 11.2*  PLT 220  CREATININE 1.01*   Estimated Creatinine Clearance: 84.1 mL/min (by C-G formula based on Cr of 1.01). No results for input(s): VANCOTROUGH, VANCOPEAK, VANCORANDOM, GENTTROUGH, GENTPEAK, GENTRANDOM, TOBRATROUGH, TOBRAPEAK, TOBRARND, AMIKACINPEAK, AMIKACINTROU, AMIKACIN in the last 72 hours.   Microbiology: Recent Results (from the past 720 hour(s))  Culture, blood (routine x 2)     Status: None (Preliminary result)   Collection Time: 12/25/14 12:47 PM  Result Value Ref Range Status   Specimen Description BLOOD RIGHT ARM  Final   Special Requests BOTTLES DRAWN AEROBIC AND ANAEROBIC 8 CC EACH  Final   Culture PENDING  Incomplete   Report Status PENDING  Incomplete  Culture, blood (routine x 2)     Status: None (Preliminary result)   Collection Time: 12/25/14 12:54 PM  Result Value Ref Range Status   Specimen Description BLOOD LEFT ARM  Final   Special Requests BOTTLES DRAWN AEROBIC AND ANAEROBIC 8 CC EACH  Final   Culture PENDING  Incomplete   Report Status PENDING  Incomplete    Medical History: Past Medical History  Diagnosis Date  . Essential hypertension, benign   . Type 2 diabetes mellitus ([place])   . Skin ulcer of thigh ([place])   . Pulmonary emboli ([place]) 2009  . History of MRSA infection   . History of panniculitis     Medications:  Scheduled:  . [START ON 12/26/2014] insulin aspart  0-15 Units Subcutaneous TID WC  . insulin aspart   0-5 Units Subcutaneous QHS  . oxyCODONE-acetaminophen  1 tablet Oral 4 times per day  . piperacillin-tazobactam (ZOSYN)  IV  3.375 g Intravenous Q8H  . rivaroxaban  20 mg Oral Q supper  . sodium chloride  1,000 mL Intravenous Q1H   And  . sodium chloride  500 mL Intravenous Q1H  . sodium chloride  3 mL Intravenous Q12H  . [START ON 12/26/2014] trandolapril  4 mg Oral Daily  . [START ON 12/26/2014] vancomycin  1,250 mg Intravenous Q12H  . vancomycin  1,000 mg Intravenous Once  . [START ON 12/26/2014] verapamil  240 mg Oral Daily   Assessment: Sepsis. Likely related to cellulitis of right medial thigh. Significant draining wound, wound care consult. Vancomycin 1 GM IV and Zosyn 3.375 GM IV given in ED Obese patient Calculated normalized CrCl 64.5 ml/min  Goal of Therapy:  Vancomycin trough level 10-15 mcg/ml  Plan:  Additional Vancomycin 1 GM IV tonight, then Vancomycin 1250 mg IV every 12 hours Vancomycin trough at steady state Monitor renal function Labs per protocol  Abner Greenspan, Elliot Simoneaux [place] 12/25/2014,7:35 PM

## 2014-12-26 LAB — COMPREHENSIVE METABOLIC PANEL
ALK PHOS: 46 U/L (ref 38–126)
ALT: 16 U/L (ref 14–54)
AST: 23 U/L (ref 15–41)
Albumin: 2.8 g/dL — ABNORMAL LOW (ref 3.5–5.0)
Anion gap: 7 (ref 5–15)
BILIRUBIN TOTAL: 0.5 mg/dL (ref 0.3–1.2)
BUN: 11 mg/dL (ref 6–20)
CALCIUM: 8.2 mg/dL — AB (ref 8.9–10.3)
CO2: 24 mmol/L (ref 22–32)
CREATININE: 0.83 mg/dL (ref 0.44–1.00)
Chloride: 108 mmol/L (ref 101–111)
Glucose, Bld: 140 mg/dL — ABNORMAL HIGH (ref 65–99)
Potassium: 4.1 mmol/L (ref 3.5–5.1)
SODIUM: 139 mmol/L (ref 135–145)
TOTAL PROTEIN: 6.7 g/dL (ref 6.5–8.1)

## 2014-12-26 LAB — URINE CULTURE: Culture: NO GROWTH

## 2014-12-26 LAB — GLUCOSE, CAPILLARY
GLUCOSE-CAPILLARY: 140 mg/dL — AB (ref 65–99)
GLUCOSE-CAPILLARY: 148 mg/dL — AB (ref 65–99)
Glucose-Capillary: 126 mg/dL — ABNORMAL HIGH (ref 65–99)
Glucose-Capillary: 109 mg/dL — ABNORMAL HIGH (ref 65–99)

## 2014-12-26 LAB — CBC
HCT: 35 % — ABNORMAL LOW (ref 36.0–46.0)
HEMOGLOBIN: 10.8 g/dL — AB (ref 12.0–15.0)
MCH: 26 pg (ref 26.0–34.0)
MCHC: 30.9 g/dL (ref 30.0–36.0)
MCV: 84.1 fL (ref 78.0–100.0)
PLATELETS: 201 10*3/uL (ref 150–400)
RBC: 4.16 MIL/uL (ref 3.87–5.11)
RDW: 14.5 % (ref 11.5–15.5)
WBC: 12 10*3/uL — AB (ref 4.0–10.5)

## 2014-12-26 NOTE — Consult Note (Signed)
WOC wound consult note Consultation via communication with WTA (wound treatment associate) at the bedside  Reason for Consult: right thigh wound Patient with chronic LE edema related to lymphedema. Reports present x 7 years, was healed and when they stop treating the site it reopened  Wound type: Chronic non healing LE ulcer related to chronic lymphedema  Measurement: Wound bed: see nursing flow sheet Drainage (amount, consistency, odor) moderate, with odor (per staff) Periwound: no erythema per staff assessment and conversation with [place] nurse Dressing procedure/placement/frequency: Will add silver hydrofiber for exudate management and antimicrobial effects for recalcitrant wound, cover with ABD pad, secure with tape.  Re consult if needed, will not follow at this time. Thanks  [name], [name] [phone])

## 2014-12-26 NOTE — Progress Notes (Signed)
Subjective: She was admitted yesterday with wound infection. She's had trouble with this for some time.  Objective: Vital signs in last 24 hours: Temp:  [99.6 F (37.6 C)-101.3 F (38.5 C)] 99.6 F (37.6 C) (11/05 0621) Pulse Rate:  [96-123] 102 (11/05 0621) Resp:  [18-20] 20 (11/05 0621) BP: (130-178)/(49-109) 162/53 mmHg (11/05 0621) SpO2:  [96 %-100 %] 100 % (11/05 0621) Weight:  [146.965 kg (324 lb)-147.8 kg (325 lb 13.4 oz)] 147.8 kg (325 lb 13.4 oz) (11/04 1640) Weight change:  Last BM Date: 12/24/14  Intake/Output from previous day: 11/04 0701 - 11/05 0700 In: 120 [P.O.:120] Out: -   PHYSICAL EXAM General appearance: alert, cooperative and moderate distress Resp: clear to auscultation bilaterally Cardio: regular rate and rhythm, S1, S2 normal, no murmur, click, rub or gallop GI: soft, non-tender; bowel sounds normal; no masses,  no organomegaly Extremities: 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 that is draining purulent material  Lab Results:  Results for orders placed or performed during the hospital encounter of 12/25/14 (from the past 48 hour(s))  Culture, blood (routine x 2)     Status: None (Preliminary result)   Collection Time: 12/25/14 12:47 PM  Result Value Ref Range   Specimen Description RIGHT ANTECUBITAL    Special Requests BOTTLES DRAWN AEROBIC AND ANAEROBIC  6CC EACH    Culture NO GROWTH < 24 HOURS    Report Status PENDING   Urinalysis, Routine w reflex microscopic     Status: Abnormal   Collection Time: 12/25/14 12:48 PM  Result Value Ref Range   Color, Urine YELLOW YELLOW   APPearance CLEAR CLEAR   Specific Gravity, Urine >1.030 (H) 1.005 - 1.030   pH 5.5 5.0 - 8.0   Glucose, UA NEGATIVE NEGATIVE mg/dL   Hgb urine dipstick NEGATIVE NEGATIVE   Bilirubin Urine NEGATIVE NEGATIVE   Ketones, ur NEGATIVE NEGATIVE mg/dL   Protein, ur 100 (A) NEGATIVE mg/dL   Urobilinogen, UA 0.2 0.0 - 1.0 mg/dL   Nitrite NEGATIVE NEGATIVE   Leukocytes, UA  NEGATIVE NEGATIVE  Urine microscopic-add on     Status: Abnormal   Collection Time: 12/25/14 12:48 PM  Result Value Ref Range   Squamous Epithelial / LPF RARE RARE   Bacteria, UA FEW (A) RARE  Comprehensive metabolic panel     Status: Abnormal   Collection Time: 12/25/14 12:53 PM  Result Value Ref Range   Sodium 139 135 - 145 mmol/L   Potassium 4.7 3.5 - 5.1 mmol/L   Chloride 105 101 - 111 mmol/L   CO2 22 22 - 32 mmol/L   Glucose, Bld 226 (H) 65 - 99 mg/dL   BUN 22 (H) 6 - 20 mg/dL   Creatinine, Ser 1.01 (H) 0.44 - 1.00 mg/dL   Calcium 9.2 8.9 - 10.3 mg/dL   Total Protein 7.3 6.5 - 8.1 g/dL   Albumin 3.3 (L) 3.5 - 5.0 g/dL   AST 23 15 - 41 U/L   ALT 15 14 - 54 U/L   Alkaline Phosphatase 52 38 - 126 U/L   Total Bilirubin 0.6 0.3 - 1.2 mg/dL   GFR calc non Af Amer 58 (L) >60 mL/min   GFR calc Af Amer >60 >60 mL/min    Comment: (NOTE) The eGFR has been calculated using the CKD EPI equation. This calculation has not been validated in all clinical situations. eGFR's persistently <60 mL/min signify possible Chronic Kidney Disease.    Anion gap 12 5 - 15  CBC with  Differential     Status: Abnormal   Collection Time: 12/25/14 12:53 PM  Result Value Ref Range   WBC 17.5 (H) 4.0 - 10.5 K/uL   RBC 4.32 3.87 - 5.11 MIL/uL   Hemoglobin 11.2 (L) 12.0 - 15.0 g/dL   HCT 36.5 36.0 - 46.0 %   MCV 84.5 78.0 - 100.0 fL   MCH 25.9 (L) 26.0 - 34.0 pg   MCHC 30.7 30.0 - 36.0 g/dL   RDW 14.7 11.5 - 15.5 %   Platelets 220 150 - 400 K/uL   Neutrophils Relative % 93 %   Neutro Abs 16.2 (H) 1.7 - 7.7 K/uL   Lymphocytes Relative 3 %   Lymphs Abs 0.6 (L) 0.7 - 4.0 K/uL   Monocytes Relative 4 %   Monocytes Absolute 0.7 0.1 - 1.0 K/uL   Eosinophils Relative 0 %   Eosinophils Absolute 0.0 0.0 - 0.7 K/uL   Basophils Relative 0 %   Basophils Absolute 0.0 0.0 - 0.1 K/uL  CK     Status: None   Collection Time: 12/25/14 12:53 PM  Result Value Ref Range   Total CK 116 38 - 234 U/L  Troponin I      Status: None   Collection Time: 12/25/14 12:53 PM  Result Value Ref Range   Troponin I 0.03 <0.031 ng/mL    Comment:        NO INDICATION OF MYOCARDIAL INJURY.   Culture, blood (routine x 2)     Status: None (Preliminary result)   Collection Time: 12/25/14 12:54 PM  Result Value Ref Range   Specimen Description LEFT ANTECUBITAL    Special Requests BOTTLES DRAWN AEROBIC AND ANAEROBIC 6CC EACH    Culture NO GROWTH < 24 HOURS    Report Status PENDING   I-Stat CG4 Lactic Acid, ED     Status: Abnormal   Collection Time: 12/25/14 12:58 PM  Result Value Ref Range   Lactic Acid, Venous 2.64 (HH) 0.5 - 2.0 mmol/L   Comment NOTIFIED PHYSICIAN   I-Stat CG4 Lactic Acid, ED     Status: None   Collection Time: 12/25/14  3:23 PM  Result Value Ref Range   Lactic Acid, Venous 1.81 0.5 - 2.0 mmol/L  Lactic acid, plasma     Status: None   Collection Time: 12/25/14  7:49 PM  Result Value Ref Range   Lactic Acid, Venous 1.2 0.5 - 2.0 mmol/L  Procalcitonin     Status: None   Collection Time: 12/25/14  7:49 PM  Result Value Ref Range   Procalcitonin 8.09 ng/mL    Comment:        Interpretation: PCT > 2 ng/mL: Systemic infection (sepsis) is likely, unless other causes are known. (NOTE)         ICU PCT Algorithm               Non ICU PCT Algorithm    ----------------------------     ------------------------------         PCT < 0.25 ng/mL                 PCT < 0.1 ng/mL     Stopping of antibiotics            Stopping of antibiotics       strongly encouraged.               strongly encouraged.    ----------------------------     ------------------------------       PCT  level decrease by               PCT < 0.25 ng/mL       >= 80% from peak PCT       OR PCT 0.25 - 0.5 ng/mL          Stopping of antibiotics                                             encouraged.     Stopping of antibiotics           encouraged.    ----------------------------     ------------------------------       PCT level  decrease by              PCT >= 0.25 ng/mL       < 80% from peak PCT        AND PCT >= 0.5 ng/mL            Continuing antibiotics                                               encouraged.       Continuing antibiotics            encouraged.    ----------------------------     ------------------------------     PCT level increase compared          PCT > 0.5 ng/mL         with peak PCT AND          PCT >= 0.5 ng/mL             Escalation of antibiotics                                          strongly encouraged.      Escalation of antibiotics        strongly encouraged.   Protime-INR     Status: Abnormal   Collection Time: 12/25/14  7:49 PM  Result Value Ref Range   Prothrombin Time 17.9 (H) 11.6 - 15.2 seconds   INR 1.47 0.00 - 1.49  APTT     Status: None   Collection Time: 12/25/14  7:49 PM  Result Value Ref Range   aPTT 30 24 - 37 seconds  Lactic acid, plasma     Status: None   Collection Time: 12/25/14  9:53 PM  Result Value Ref Range   Lactic Acid, Venous 0.8 0.5 - 2.0 mmol/L  Glucose, capillary     Status: Abnormal   Collection Time: 12/25/14 10:47 PM  Result Value Ref Range   Glucose-Capillary 147 (H) 65 - 99 mg/dL   Comment 1 Notify RN    Comment 2 Document in Chart   Comprehensive metabolic panel     Status: Abnormal   Collection Time: 12/26/14  6:56 AM  Result Value Ref Range   Sodium 139 135 - 145 mmol/L   Potassium 4.1 3.5 - 5.1 mmol/L   Chloride 108 101 - 111 mmol/L   CO2 24 22 - 32 mmol/L   Glucose, Bld 140 (H) 65 - 99 mg/dL  BUN 11 6 - 20 mg/dL   Creatinine, Ser 0.83 0.44 - 1.00 mg/dL   Calcium 8.2 (L) 8.9 - 10.3 mg/dL   Total Protein 6.7 6.5 - 8.1 g/dL   Albumin 2.8 (L) 3.5 - 5.0 g/dL   AST 23 15 - 41 U/L   ALT 16 14 - 54 U/L   Alkaline Phosphatase 46 38 - 126 U/L   Total Bilirubin 0.5 0.3 - 1.2 mg/dL   GFR calc non Af Amer >60 >60 mL/min   GFR calc Af Amer >60 >60 mL/min    Comment: (NOTE) The eGFR has been calculated using the CKD EPI  equation. This calculation has not been validated in all clinical situations. eGFR's persistently <60 mL/min signify possible Chronic Kidney Disease.    Anion gap 7 5 - 15  CBC     Status: Abnormal   Collection Time: 12/26/14  6:56 AM  Result Value Ref Range   WBC 12.0 (H) 4.0 - 10.5 K/uL   RBC 4.16 3.87 - 5.11 MIL/uL   Hemoglobin 10.8 (L) 12.0 - 15.0 g/dL   HCT 35.0 (L) 36.0 - 46.0 %   MCV 84.1 78.0 - 100.0 fL   MCH 26.0 26.0 - 34.0 pg   MCHC 30.9 30.0 - 36.0 g/dL   RDW 14.5 11.5 - 15.5 %   Platelets 201 150 - 400 K/uL  Glucose, capillary     Status: Abnormal   Collection Time: 12/26/14  7:36 AM  Result Value Ref Range   Glucose-Capillary 140 (H) 65 - 99 mg/dL   Comment 1 Notify RN    Comment 2 Document in Chart     ABGS No results for input(s): PHART, PO2ART, TCO2, HCO3 in the last 72 hours.  Invalid input(s): PCO2 CULTURES Recent Results (from the past 240 hour(s))  Culture, blood (routine x 2)     Status: None (Preliminary result)   Collection Time: 12/25/14 12:47 PM  Result Value Ref Range Status   Specimen Description RIGHT ANTECUBITAL  Final   Special Requests BOTTLES DRAWN AEROBIC AND ANAEROBIC  [place]  Final   Culture NO GROWTH < 24 HOURS  Final   Report Status PENDING  Incomplete  Culture, blood (routine x 2)     Status: None (Preliminary result)   Collection Time: 12/25/14 12:54 PM  Result Value Ref Range Status   Specimen Description LEFT ANTECUBITAL  Final   Special Requests BOTTLES DRAWN AEROBIC AND ANAEROBIC [place]  Final   Culture NO GROWTH < 24 HOURS  Final   Report Status PENDING  Incomplete   Studies/Results: Dg Chest 1 View  12/25/2014  CLINICAL DATA:  Fever EXAM: CHEST 1 VIEW COMPARISON:  March 18, 2007 FINDINGS: There is no edema or consolidation. Heart is upper normal in size with pulmonary vascularity within normal limits. No adenopathy. No pneumothorax. No bone lesions. IMPRESSION: No edema or consolidation. Electronically Signed   By:  Lowella Grip III M.D.   On: 12/25/2014 14:01   Dg Knee Complete 4 Views Left  12/25/2014  CLINICAL DATA:  Fever.  Knee pain. EXAM: LEFT KNEE - COMPLETE 4+ VIEW COMPARISON:  None. FINDINGS: No joint effusion identified. Severe tricompartment osteoarthritis is noted with marked medial compartment and patellofemoral compartment narrowing and marginal spur formation. No fractures or subluxations identified. No radiopaque foreign bodies are soft tissue calcifications. IMPRESSION: 1. Severe osteoarthritis. Electronically Signed   By: Kerby Moors M.D.   On: 12/25/2014 14:05   Dg Knee Complete 4 Views Right  12/25/2014  CLINICAL DATA:  Pain following fall EXAM: RIGHT KNEE - COMPLETE 4+ VIEW COMPARISON:  December 26, 2004 FINDINGS: Frontal, bilateral oblique, and lateral views were obtained. There is no fracture or dislocation. No joint effusion. There is marked narrowing medially with virtually complete loss of disc space medially. There is moderate narrowing in the patellofemoral joint with slightly less narrowing laterally. There is spurring in all compartments, most marked medially. There are multiple foci of arterial vascular calcification. No erosive change. IMPRESSION: Extensive osteoarthritic change, most marked medially. No fracture or effusion. Extensive arterial vascular calcification present. Electronically Signed   By: Lowella Grip III M.D.   On: 12/25/2014 14:05   Dg Hips Bilat With Pelvis 3-4 Views  12/25/2014  CLINICAL DATA:  64 year old who spotted and fell onto the floor earlier today, complaining of pelvic and hip pain. Initial encounter. EXAM: DG HIP (WITH OR WITHOUT PELVIS) 3-4V BILAT COMPARISON:  None. FINDINGS: No acute fractures identified. Hip joints intact with symmetric moderate to severe inferomedial joint space narrowing. Sacroiliac joints and symphysis pubis intact. Mild osseous demineralization. IMPRESSION: 1. No acute osseous abnormality. 2. 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 Electronically Signed   By: Evangeline Dakin M.D.   On: 12/25/2014 14:04    Medications:  Prior to Admission:  Prescriptions prior to admission  Medication Sig Dispense Refill Last Dose  . CELEBREX 200 MG capsule Take 200 mg by mouth daily.    12/25/2014 at Unknown time  . glyBURIDE-metformin (GLUCOVANCE) 5-500 MG per tablet Take 1 tablet by mouth 3 (three) times daily.    12/25/2014 at Unknown time  . oxyCODONE-acetaminophen (PERCOCET/ROXICET) 5-325 MG per tablet Take 1 tablet by mouth 4 (four) times daily.    12/25/2014 at Unknown time  . trandolapril (MAVIK) 4 MG tablet Take 4 mg by mouth daily.    12/25/2014 at Unknown time  . verapamil (CALAN-SR) 240 MG CR tablet Take 240 mg by mouth daily.    12/25/2014 at Unknown time  . XARELTO 20 MG TABS Take 20 mg by mouth daily with supper.    12/24/2014 at 1700   Scheduled: . insulin aspart  0-15 Units Subcutaneous TID WC  . insulin aspart  0-5 Units Subcutaneous QHS  . oxyCODONE-acetaminophen  1 tablet Oral 4 times per day  . piperacillin-tazobactam (ZOSYN)  IV  3.375 g Intravenous Q8H  . rivaroxaban  20 mg Oral Q supper  . sodium chloride  3 mL Intravenous Q12H  . trandolapril  4 mg Oral Daily  . vancomycin  1,250 mg Intravenous Q12H  . verapamil  240 mg Oral Daily   Continuous:  BCW:[id number] **OR** acetaminophen, ondansetron **OR** ondansetron (ZOFRAN) IV  Assesment: She was admitted with cellulitis of her leg and associated sepsis. She had DVT and pulmonary emboli and because she is essentially immobile she is chronically anticoagulated. She has morbid obesity which is unchanged. Active Problems:   Type 2 diabetes mellitus ([place])   Essential hypertension, benign   Morbid obesity ([place])   Fever, unspecified   Sepsis ([place])   Cellulitis    Plan: Continue current treatments including IV antibiotics    LOS: 1 day   Trevonn Hallum L 12/26/2014, 11:06 AM

## 2014-12-26 NOTE — Progress Notes (Signed)
Dressing to wound on R thigh applied per wound nurse orders.  Sacral foam also added d/t MASD on sacrum, between buttocks.

## 2014-12-26 NOTE — Progress Notes (Signed)
Utilization review Completed Allizon Woznick RN BSN   

## 2014-12-27 LAB — BASIC METABOLIC PANEL
ANION GAP: 8 (ref 5–15)
BUN: 10 mg/dL (ref 6–20)
CHLORIDE: 108 mmol/L (ref 101–111)
CO2: 26 mmol/L (ref 22–32)
Calcium: 8.6 mg/dL — ABNORMAL LOW (ref 8.9–10.3)
Creatinine, Ser: 0.94 mg/dL (ref 0.44–1.00)
GFR calc non Af Amer: >60 mL/min (ref >60)
GLUCOSE: 116 mg/dL — AB (ref 65–99)
POTASSIUM: 3.9 mmol/L (ref 3.5–5.1)
Sodium: 142 mmol/L (ref 135–145)

## 2014-12-27 LAB — GLUCOSE, CAPILLARY
GLUCOSE-CAPILLARY: 108 mg/dL — AB (ref 65–99)
Glucose-Capillary: 121 mg/dL — ABNORMAL HIGH (ref 65–99)
Glucose-Capillary: 170 mg/dL — ABNORMAL HIGH (ref 65–99)
Glucose-Capillary: 115 mg/dL — ABNORMAL HIGH (ref 65–99)

## 2014-12-27 MED ORDER — SODIUM CHLORIDE 0.9 % IJ SOLN: 10.0000 mL | INTRAMUSCULAR | Status: DC | PRN

## 2014-12-27 MED ORDER — SODIUM CHLORIDE 0.9 % IJ SOLN
10.0000 mL | Freq: Two times a day (BID) | INTRAMUSCULAR | Status: DC
  Administered 2014-12-27 – 2014-12-29 (×5): 10 mL

## 2014-12-27 NOTE — Progress Notes (Signed)
Peripherally Inserted Central Catheter/Midline Placement  The IV Nurse has discussed with the patient and/or persons authorized to consent for the patient, the purpose of this procedure and the potential benefits and risks involved with this procedure.  The benefits include less needle sticks, lab draws from the catheter and patient may be discharged home with the catheter.  Risks include, but not limited to, infection, bleeding, blood clot (thrombus formation), and puncture of an artery; nerve damage and irregular heat beat.  Alternatives to this procedure were also discussed.  PICC/Midline Placement Documentation  PICC / Midline Single Lumen 12/27/14 PICC Right Brachial 44 cm 1 cm (Active)  Indication for Insertion or Continuance of Line Limited venous access - need for IV therapy >5 days (PICC only) 12/27/2014 12:50 PM  Exposed Catheter (cm) 1 cm 12/27/2014 12:50 PM  Site Assessment Clean;Dry;Intact 12/27/2014 12:50 PM  Line Status Flushed;Blood return noted 12/27/2014 12:50 PM  Dressing Type Transparent 12/27/2014 12:50 PM  Dressing Status Clean;Dry;Intact;Antimicrobial disc in place 12/27/2014 12:50 PM  Line Care Connections checked and tightened 12/27/2014 12:50 PM  Dressing Intervention New dressing 12/27/2014 12:50 PM  Dressing Change Due 01/03/15 12/27/2014 12:50 PM       Hillery Jacks 12/27/2014, 12:52 PM

## 2014-12-27 NOTE — Progress Notes (Signed)
Subjective: She says she feels okay. She has no new complaints. Her breathing is doing okay. She still has pain in her leg but it is better.  Objective: Vital signs in last 24 hours: Temp:  [98.5 F (36.9 C)-99.1 F (37.3 C)] 99.1 F (37.3 C) (11/06 0506) Pulse Rate:  [78-85] 85 (11/06 0506) Resp:  [20] 20 (11/06 0506) BP: (139-162)/(52-57) 162/57 mmHg (11/06 0506) SpO2:  [96 %-97 %] 97 % (11/06 0506) Weight change:  Last BM Date: 12/24/14  Intake/Output from previous day: 11/05 0701 - 11/06 0700 In: 1250 [P.O.:600; IV Piggyback:650] Out: 2825 [Urine:2825]  PHYSICAL EXAM General appearance: alert, cooperative, mild distress and morbidly obese Resp: clear to auscultation bilaterally Cardio: regular rate and rhythm, S1, S2 normal, no murmur, click, rub or gallop GI: soft, non-tender; bowel sounds normal; no masses,  no organomegaly Extremities: The wound is about the same  Lab Results:  Results for orders placed or performed during the hospital encounter of 12/25/14 (from the past 48 hour(s))  Culture, blood (routine x 2)     Status: None (Preliminary result)   Collection Time: 12/25/14 12:47 PM  Result Value Ref Range   Specimen Description RIGHT ANTECUBITAL    Special Requests BOTTLES DRAWN AEROBIC AND ANAEROBIC  6CC EACH    Culture NO GROWTH < 24 HOURS    Report Status PENDING   Urinalysis, Routine w reflex microscopic     Status: Abnormal   Collection Time: 12/25/14 12:48 PM  Result Value Ref Range   Color, Urine YELLOW YELLOW   APPearance CLEAR CLEAR   Specific Gravity, Urine >1.030 (H) 1.005 - 1.030   pH 5.5 5.0 - 8.0   Glucose, UA NEGATIVE NEGATIVE mg/dL   Hgb urine dipstick NEGATIVE NEGATIVE   Bilirubin Urine NEGATIVE NEGATIVE   Ketones, ur NEGATIVE NEGATIVE mg/dL   Protein, ur 100 (A) NEGATIVE mg/dL   Urobilinogen, UA 0.2 0.0 - 1.0 mg/dL   Nitrite NEGATIVE NEGATIVE   Leukocytes, UA NEGATIVE NEGATIVE  Urine culture     Status: None   Collection Time:  12/25/14 12:48 PM  Result Value Ref Range   Specimen Description URINE, CATHETERIZED    Special Requests NONE    Culture      NO GROWTH 1 DAY Performed at [facility]    Report Status 12/26/2014 FINAL   Urine microscopic-add on     Status: Abnormal   Collection Time: 12/25/14 12:48 PM  Result Value Ref Range   Squamous Epithelial / LPF RARE RARE   Bacteria, UA FEW (A) RARE  Comprehensive metabolic panel     Status: Abnormal   Collection Time: 12/25/14 12:53 PM  Result Value Ref Range   Sodium 139 135 - 145 mmol/L   Potassium 4.7 3.5 - 5.1 mmol/L   Chloride 105 101 - 111 mmol/L   CO2 22 22 - 32 mmol/L   Glucose, Bld 226 (H) 65 - 99 mg/dL   BUN 22 (H) 6 - 20 mg/dL   Creatinine, Ser 1.01 (H) 0.44 - 1.00 mg/dL   Calcium 9.2 8.9 - 10.3 mg/dL   Total Protein 7.3 6.5 - 8.1 g/dL   Albumin 3.3 (L) 3.5 - 5.0 g/dL   AST 23 15 - 41 U/L   ALT 15 14 - 54 U/L   Alkaline Phosphatase 52 38 - 126 U/L   Total Bilirubin 0.6 0.3 - 1.2 mg/dL   GFR calc non Af Amer 58 (L) >60 mL/min   GFR calc Af Amer >60 >60  mL/min    Comment: (NOTE) The eGFR has been calculated using the CKD EPI equation. This calculation has not been validated in all clinical situations. eGFR's persistently <60 mL/min signify possible Chronic Kidney Disease.    Anion gap 12 5 - 15  CBC with Differential     Status: Abnormal   Collection Time: 12/25/14 12:53 PM  Result Value Ref Range   WBC 17.5 (H) 4.0 - 10.5 K/uL   RBC 4.32 3.87 - 5.11 MIL/uL   Hemoglobin 11.2 (L) 12.0 - 15.0 g/dL   HCT 36.5 36.0 - 46.0 %   MCV 84.5 78.0 - 100.0 fL   MCH 25.9 (L) 26.0 - 34.0 pg   MCHC 30.7 30.0 - 36.0 g/dL   RDW 14.7 11.5 - 15.5 %   Platelets 220 150 - 400 K/uL   Neutrophils Relative % 93 %   Neutro Abs 16.2 (H) 1.7 - 7.7 K/uL   Lymphocytes Relative 3 %   Lymphs Abs 0.6 (L) 0.7 - 4.0 K/uL   Monocytes Relative 4 %   Monocytes Absolute 0.7 0.1 - 1.0 K/uL   Eosinophils Relative 0 %   Eosinophils Absolute 0.0 0.0 - 0.7  K/uL   Basophils Relative 0 %   Basophils Absolute 0.0 0.0 - 0.1 K/uL  CK     Status: None   Collection Time: 12/25/14 12:53 PM  Result Value Ref Range   Total CK 116 38 - 234 U/L  Troponin I     Status: None   Collection Time: 12/25/14 12:53 PM  Result Value Ref Range   Troponin I 0.03 <0.031 ng/mL    Comment:        NO INDICATION OF MYOCARDIAL INJURY.   Culture, blood (routine x 2)     Status: None (Preliminary result)   Collection Time: 12/25/14 12:54 PM  Result Value Ref Range   Specimen Description LEFT ANTECUBITAL    Special Requests BOTTLES DRAWN AEROBIC AND ANAEROBIC 6CC EACH    Culture NO GROWTH < 24 HOURS    Report Status PENDING   I-Stat CG4 Lactic Acid, ED     Status: Abnormal   Collection Time: 12/25/14 12:58 PM  Result Value Ref Range   Lactic Acid, Venous 2.64 (HH) 0.5 - 2.0 mmol/L   Comment NOTIFIED PHYSICIAN   I-Stat CG4 Lactic Acid, ED     Status: None   Collection Time: 12/25/14  3:23 PM  Result Value Ref Range   Lactic Acid, Venous 1.81 0.5 - 2.0 mmol/L  Lactic acid, plasma     Status: None   Collection Time: 12/25/14  7:49 PM  Result Value Ref Range   Lactic Acid, Venous 1.2 0.5 - 2.0 mmol/L  Procalcitonin     Status: None   Collection Time: 12/25/14  7:49 PM  Result Value Ref Range   Procalcitonin 8.09 ng/mL    Comment:        Interpretation: PCT > 2 ng/mL: Systemic infection (sepsis) is likely, unless other causes are known. (NOTE)         ICU PCT Algorithm               Non ICU PCT Algorithm    ----------------------------     ------------------------------         PCT < 0.25 ng/mL                 PCT < 0.1 ng/mL     Stopping of antibiotics  Stopping of antibiotics       strongly encouraged.               strongly encouraged.    ----------------------------     ------------------------------       PCT level decrease by               PCT < 0.25 ng/mL       >= 80% from peak PCT       OR PCT 0.25 - 0.5 ng/mL          Stopping of  antibiotics                                             encouraged.     Stopping of antibiotics           encouraged.    ----------------------------     ------------------------------       PCT level decrease by              PCT >= 0.25 ng/mL       < 80% from peak PCT        AND PCT >= 0.5 ng/mL            Continuing antibiotics                                               encouraged.       Continuing antibiotics            encouraged.    ----------------------------     ------------------------------     PCT level increase compared          PCT > 0.5 ng/mL         with peak PCT AND          PCT >= 0.5 ng/mL             Escalation of antibiotics                                          strongly encouraged.      Escalation of antibiotics        strongly encouraged.   Protime-INR     Status: Abnormal   Collection Time: 12/25/14  7:49 PM  Result Value Ref Range   Prothrombin Time 17.9 (H) 11.6 - 15.2 seconds   INR 1.47 0.00 - 1.49  APTT     Status: None   Collection Time: 12/25/14  7:49 PM  Result Value Ref Range   aPTT 30 24 - 37 seconds  Lactic acid, plasma     Status: None   Collection Time: 12/25/14  9:53 PM  Result Value Ref Range   Lactic Acid, Venous 0.8 0.5 - 2.0 mmol/L  Glucose, capillary     Status: Abnormal   Collection Time: 12/25/14 10:47 PM  Result Value Ref Range   Glucose-Capillary 147 (H) 65 - 99 mg/dL   Comment 1 Notify RN    Comment 2 Document in Chart   Comprehensive metabolic panel     Status: Abnormal   Collection Time: 12/26/14  6:56 AM  Result Value Ref  Range   Sodium 139 135 - 145 mmol/L   Potassium 4.1 3.5 - 5.1 mmol/L   Chloride 108 101 - 111 mmol/L   CO2 24 22 - 32 mmol/L   Glucose, Bld 140 (H) 65 - 99 mg/dL   BUN 11 6 - 20 mg/dL   Creatinine, Ser 0.83 0.44 - 1.00 mg/dL   Calcium 8.2 (L) 8.9 - 10.3 mg/dL   Total Protein 6.7 6.5 - 8.1 g/dL   Albumin 2.8 (L) 3.5 - 5.0 g/dL   AST 23 15 - 41 U/L   ALT 16 14 - 54 U/L   Alkaline Phosphatase  46 38 - 126 U/L   Total Bilirubin 0.5 0.3 - 1.2 mg/dL   GFR calc non Af Amer >60 >60 mL/min   GFR calc Af Amer >60 >60 mL/min    Comment: (NOTE) The eGFR has been calculated using the CKD EPI equation. This calculation has not been validated in all clinical situations. eGFR's persistently <60 mL/min signify possible Chronic Kidney Disease.    Anion gap 7 5 - 15  CBC     Status: Abnormal   Collection Time: 12/26/14  6:56 AM  Result Value Ref Range   WBC 12.0 (H) 4.0 - 10.5 K/uL   RBC 4.16 3.87 - 5.11 MIL/uL   Hemoglobin 10.8 (L) 12.0 - 15.0 g/dL   HCT 35.0 (L) 36.0 - 46.0 %   MCV 84.1 78.0 - 100.0 fL   MCH 26.0 26.0 - 34.0 pg   MCHC 30.9 30.0 - 36.0 g/dL   RDW 14.5 11.5 - 15.5 %   Platelets 201 150 - 400 K/uL  Glucose, capillary     Status: Abnormal   Collection Time: 12/26/14  7:36 AM  Result Value Ref Range   Glucose-Capillary 140 (H) 65 - 99 mg/dL   Comment 1 Notify RN    Comment 2 Document in Chart   Glucose, capillary     Status: Abnormal   Collection Time: 12/26/14 11:39 AM  Result Value Ref Range   Glucose-Capillary 126 (H) 65 - 99 mg/dL   Comment 1 Notify RN    Comment 2 Document in Chart   Glucose, capillary     Status: Abnormal   Collection Time: 12/26/14  4:39 PM  Result Value Ref Range   Glucose-Capillary 109 (H) 65 - 99 mg/dL   Comment 1 Notify RN    Comment 2 Document in Chart   Glucose, capillary     Status: Abnormal   Collection Time: 12/26/14  8:27 PM  Result Value Ref Range   Glucose-Capillary 148 (H) 65 - 99 mg/dL   Comment 1 Notify RN    Comment 2 Document in Chart   Basic metabolic panel     Status: Abnormal   Collection Time: 12/27/14  4:50 AM  Result Value Ref Range   Sodium 142 135 - 145 mmol/L   Potassium 3.9 3.5 - 5.1 mmol/L   Chloride 108 101 - 111 mmol/L   CO2 26 22 - 32 mmol/L   Glucose, Bld 116 (H) 65 - 99 mg/dL   BUN 10 6 - 20 mg/dL   Creatinine, Ser 0.94 0.44 - 1.00 mg/dL   Calcium 8.6 (L) 8.9 - 10.3 mg/dL   GFR calc non Af Amer  >60 >60 mL/min   GFR calc Af Amer >60 >60 mL/min    Comment: (NOTE) The eGFR has been calculated using the CKD EPI equation. This calculation has not been validated in all clinical situations. eGFR's  persistently <60 mL/min signify possible Chronic Kidney Disease.    Anion gap 8 5 - 15  Glucose, capillary     Status: Abnormal   Collection Time: 12/27/14  8:05 AM  Result Value Ref Range   Glucose-Capillary 121 (H) 65 - 99 mg/dL    ABGS No results for input(s): PHART, PO2ART, TCO2, HCO3 in the last 72 hours.  Invalid input(s): PCO2 CULTURES Recent Results (from the past 240 hour(s))  Culture, blood (routine x 2)     Status: None (Preliminary result)   Collection Time: 12/25/14 12:47 PM  Result Value Ref Range Status   Specimen Description RIGHT ANTECUBITAL  Final   Special Requests BOTTLES DRAWN AEROBIC AND ANAEROBIC  [place]  Final   Culture NO GROWTH < 24 HOURS  Final   Report Status PENDING  Incomplete  Urine culture     Status: None   Collection Time: 12/25/14 12:48 PM  Result Value Ref Range Status   Specimen Description URINE, CATHETERIZED  Final   Special Requests NONE  Final   Culture   Final    NO GROWTH 1 DAY Performed at [facility]    Report Status 12/26/2014 FINAL  Final  Culture, blood (routine x 2)     Status: None (Preliminary result)   Collection Time: 12/25/14 12:54 PM  Result Value Ref Range Status   Specimen Description LEFT ANTECUBITAL  Final   Special Requests BOTTLES DRAWN AEROBIC AND ANAEROBIC [place]  Final   Culture NO GROWTH < 24 HOURS  Final   Report Status PENDING  Incomplete   Studies/Results: Dg Chest 1 View  12/25/2014  CLINICAL DATA:  Fever EXAM: CHEST 1 VIEW COMPARISON:  March 18, 2007 FINDINGS: There is no edema or consolidation. Heart is upper normal in size with pulmonary vascularity within normal limits. No adenopathy. No pneumothorax. No bone lesions. IMPRESSION: No edema or consolidation. Electronically Signed   By:  Lowella Grip III M.D.   On: 12/25/2014 14:01   Dg Knee Complete 4 Views Left  12/25/2014  CLINICAL DATA:  Fever.  Knee pain. EXAM: LEFT KNEE - COMPLETE 4+ VIEW COMPARISON:  None. FINDINGS: No joint effusion identified. Severe tricompartment osteoarthritis is noted with marked medial compartment and patellofemoral compartment narrowing and marginal spur formation. No fractures or subluxations identified. No radiopaque foreign bodies are soft tissue calcifications. IMPRESSION: 1. Severe osteoarthritis. Electronically Signed   By: Kerby Moors M.D.   On: 12/25/2014 14:05   Dg Knee Complete 4 Views Right  12/25/2014  CLINICAL DATA:  Pain following fall EXAM: RIGHT KNEE - COMPLETE 4+ VIEW COMPARISON:  December 26, 2004 FINDINGS: Frontal, bilateral oblique, and lateral views were obtained. There is no fracture or dislocation. No joint effusion. There is marked narrowing medially with virtually complete loss of disc space medially. There is moderate narrowing in the patellofemoral joint with slightly less narrowing laterally. There is spurring in all compartments, most marked medially. There are multiple foci of arterial vascular calcification. No erosive change. IMPRESSION: Extensive osteoarthritic change, most marked medially. No fracture or effusion. Extensive arterial vascular calcification present. Electronically Signed   By: Lowella Grip III M.D.   On: 12/25/2014 14:05   Dg Hips Bilat With Pelvis 3-4 Views  12/25/2014  CLINICAL DATA:  64 year old who spotted and fell onto the floor earlier today, complaining of pelvic and hip pain. Initial encounter. EXAM: DG HIP (WITH OR WITHOUT PELVIS) 3-4V BILAT COMPARISON:  None. FINDINGS: No acute fractures identified. Hip joints intact  with symmetric moderate to severe inferomedial joint space narrowing. Sacroiliac joints and symphysis pubis intact. Mild osseous demineralization. IMPRESSION: 1. No acute osseous abnormality. 2. 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 Electronically Signed   By: Evangeline Dakin M.D.   On: 12/25/2014 14:04    Medications:  Prior to Admission:  Prescriptions prior to admission  Medication Sig Dispense Refill Last Dose  . CELEBREX 200 MG capsule Take 200 mg by mouth daily.    12/25/2014 at Unknown time  . glyBURIDE-metformin (GLUCOVANCE) 5-500 MG per tablet Take 1 tablet by mouth 3 (three) times daily.    12/25/2014 at Unknown time  . oxyCODONE-acetaminophen (PERCOCET/ROXICET) 5-325 MG per tablet Take 1 tablet by mouth 4 (four) times daily.    12/25/2014 at Unknown time  . trandolapril (MAVIK) 4 MG tablet Take 4 mg by mouth daily.    12/25/2014 at Unknown time  . verapamil (CALAN-SR) 240 MG CR tablet Take 240 mg by mouth daily.    12/25/2014 at Unknown time  . XARELTO 20 MG TABS Take 20 mg by mouth daily with supper.    12/24/2014 at 1700   Scheduled: . insulin aspart  0-15 Units Subcutaneous TID WC  . insulin aspart  0-5 Units Subcutaneous QHS  . oxyCODONE-acetaminophen  1 tablet Oral 4 times per day  . piperacillin-tazobactam (ZOSYN)  IV  3.375 g Intravenous Q8H  . rivaroxaban  20 mg Oral Q supper  . sodium chloride  3 mL Intravenous Q12H  . trandolapril  4 mg Oral Daily  . vancomycin  1,250 mg Intravenous Q12H  . verapamil  240 mg Oral Daily   Continuous:  CVU:[id number] **OR** acetaminophen, ondansetron **OR** ondansetron (ZOFRAN) IV  Assesment: She was admitted with cellulitis of her leg and sepsis from that. Her sepsis has resolved. She has diabetes at baseline. She has morbid obesity and has a history of pulmonary emboli and she is chronically anticoagulated because she is essentially immobile. Apparently she did not have wound culture done on admission. I think she is going to need IV antibiotics as an outpatient so I'm going to have her get a PICC line Active Problems:   Type 2 diabetes mellitus ([place])   Essential hypertension, benign   Morbid obesity ([place])   Fever, unspecified    Sepsis ([place])   Cellulitis    Plan: PICC line placement and then potential discharge in the next 48 hours    LOS: 2 days   Tyren Dugar L 12/27/2014, 10:30 AM

## 2014-12-28 LAB — BASIC METABOLIC PANEL
Anion gap: 10 (ref 5–15)
BUN: 10 mg/dL (ref 6–20)
CALCIUM: 8.6 mg/dL — AB (ref 8.9–10.3)
CO2: 27 mmol/L (ref 22–32)
Chloride: 109 mmol/L (ref 101–111)
Creatinine, Ser: 0.96 mg/dL (ref 0.44–1.00)
GFR calc Af Amer: >60 mL/min (ref >60)
GLUCOSE: 114 mg/dL — AB (ref 65–99)
POTASSIUM: 3.9 mmol/L (ref 3.5–5.1)
Sodium: 146 mmol/L — ABNORMAL HIGH (ref 135–145)

## 2014-12-28 LAB — HEMOGLOBIN A1C
HEMOGLOBIN A1C: 7.5 % — AB (ref 4.8–5.6)
MEAN PLASMA GLUCOSE: 169 mg/dL

## 2014-12-28 LAB — CBC
HCT: 35.1 % — ABNORMAL LOW (ref 36.0–46.0)
HEMOGLOBIN: 10.5 g/dL — AB (ref 12.0–15.0)
MCH: 25.5 pg — AB (ref 26.0–34.0)
MCHC: 29.9 g/dL — AB (ref 30.0–36.0)
MCV: 85.2 fL (ref 78.0–100.0)
PLATELETS: 224 10*3/uL (ref 150–400)
RBC: 4.12 MIL/uL (ref 3.87–5.11)
RDW: 14.6 % (ref 11.5–15.5)
WBC: 8.1 10*3/uL (ref 4.0–10.5)

## 2014-12-28 LAB — GLUCOSE, CAPILLARY
GLUCOSE-CAPILLARY: 129 mg/dL — AB (ref 65–99)
Glucose-Capillary: 108 mg/dL — ABNORMAL HIGH (ref 65–99)
Glucose-Capillary: 129 mg/dL — ABNORMAL HIGH (ref 65–99)
Glucose-Capillary: 155 mg/dL — ABNORMAL HIGH (ref 65–99)

## 2014-12-28 LAB — VANCOMYCIN, TROUGH: Vancomycin Tr: 19 ug/mL (ref 10.0–20.0)

## 2014-12-28 MED ORDER — VANCOMYCIN HCL 10 G IV SOLR
2000.0000 mg | INTRAVENOUS | Status: DC
  Administered 2014-12-28 – 2014-12-29 (×2): 2000 mg via INTRAVENOUS
  Filled 2014-12-28 (×3): qty 2000

## 2014-12-28 NOTE — Progress Notes (Signed)
ANTIBIOTIC CONSULT NOTE   Pharmacy Consult for Vancomycin & Zosyn Indication: cellulitis  No Known Allergies  Patient Measurements: Height: 5\' 6"  (167.6 cm) Weight: (!) 325 lb 13.4 oz (147.8 kg) IBW/kg (Calculated) : 59.3 Adjusted Body Weight:   Vital Signs: Temp: 98.9 F (37.2 C) (11/07 0653) Temp Source: Oral (11/07 0653) BP: 147/62 mmHg (11/07 0653) Pulse Rate: 78 (11/07 0653) Intake/Output from previous day: 11/06 0701 - 11/07 0700 In: 720 [P.O.:720] Out: 2875 [Urine:2875] Intake/Output from this shift:    Labs:  Recent Labs  12/25/14 1253 12/26/14 0656 12/27/14 0450 12/28/14 0530  WBC 17.5* 12.0*  --  8.1  HGB 11.2* 10.8*  --  10.5*  PLT 220 201  --  224  CREATININE 1.01* 0.83 0.94 0.96   Estimated Creatinine Clearance: 88.5 mL/min (by C-G formula based on Cr of 0.96).  Recent Labs  12/28/14 0530  [place] 19     Microbiology: Recent Results (from the past 720 hour(s))  Culture, blood (routine x 2)     Status: None (Preliminary result)   Collection Time: 12/25/14 12:47 PM  Result Value Ref Range Status   Specimen Description RIGHT ANTECUBITAL  Final   Special Requests BOTTLES DRAWN AEROBIC AND ANAEROBIC  [place]  Final   Culture NO GROWTH 2 DAYS  Final   Report Status PENDING  Incomplete  Urine culture     Status: None   Collection Time: 12/25/14 12:48 PM  Result Value Ref Range Status   Specimen Description URINE, CATHETERIZED  Final   Special Requests NONE  Final   Culture   Final    NO GROWTH 1 DAY Performed at [facility]    Report Status 12/26/2014 FINAL  Final  Culture, blood (routine x 2)     Status: None (Preliminary result)   Collection Time: 12/25/14 12:54 PM  Result Value Ref Range Status   Specimen Description LEFT ANTECUBITAL  Final   Special Requests BOTTLES DRAWN AEROBIC AND ANAEROBIC [place]  Final   Culture NO GROWTH 2 DAYS  Final   Report Status PENDING  Incomplete    Medical History: Past Medical History   Diagnosis Date  . Essential hypertension, benign   . Type 2 diabetes mellitus ([place])   . Skin ulcer of thigh ([place])   . Pulmonary emboli ([place]) 2009  . History of MRSA infection   . History of panniculitis     Medications:  Scheduled:  . insulin aspart  0-15 Units Subcutaneous TID WC  . insulin aspart  0-5 Units Subcutaneous QHS  . oxyCODONE-acetaminophen  1 tablet Oral 4 times per day  . piperacillin-tazobactam (ZOSYN)  IV  3.375 g Intravenous Q8H  . rivaroxaban  20 mg Oral Q supper  . sodium chloride  10-40 mL Intracatheter Q12H  . sodium chloride  3 mL Intravenous Q12H  . trandolapril  4 mg Oral Daily  . verapamil  240 mg Oral Daily   Assessment: Sepsis. Likely related to cellulitis of right medial thigh. Significant draining wound.  Placed PICC line as will need IV abx at discharge.  Vanc trough today 19 mg/L  Renal function stable  Goal of Therapy:  Vancomycin trough level 10-15 mcg/ml  Plan:  Change vancomycin to 2 gm IV q24 hours F/u renal function, cultures and clinical course Thanks for allowing pharmacy to be a part of this patient's care.  Excell Seltzer, PharmD Clinical Pharmacist 12/28/2014,8:20 AM

## 2014-12-28 NOTE — Care Management Note (Signed)
Case Management Note  Patient Details  Name: [name] MRN: [id number] Date of Birth: 1951/01/03  Subjective/Objective:                  Pt is from home, ind at baseline. Pt admitted for cellulitis.   Action/Plan: Pt plans to return home with [facility] services at DC. Pt will require IV abx. Pt has chosen [facility] for [facility] services. Romualdo Bolk, of [facility], is aware of referral and will obtain pt info from chart. Anticipate DC on 12/29/2014, will cont to follow.   Expected Discharge Date:      12/29/2014            Expected Discharge Plan:  [place]  In-House Referral:  NA  Discharge planning Services  CM Consult  Post Acute Care Choice:  Home Health Choice offered to:  Patient  DME Arranged:    DME Agency:     HH Arranged:  RN [place] Agency:  [place]  Status of Service:  In process, will continue to follow  Medicare Important Message Given:    Date Medicare IM Given:    Medicare IM give by:    Date Additional Medicare IM Given:    Additional Medicare Important Message give by:     If discussed at [place] of Stay Meetings, dates discussed:    Additional Comments:  Sherald Barge, RN 12/28/2014, 1:22 PM

## 2014-12-28 NOTE — Progress Notes (Signed)
Subjective: She feels better. She is still having pain. She still does not feel well but is better. She is still having drainage from her wound but it seems less. Culture unfortunately was not done of her wound prior to starting antibiotics  Objective: Vital signs in last 24 hours: Temp:  [98.8 F (37.1 C)-98.9 F (37.2 C)] 98.9 F (37.2 C) (11/07 0653) Pulse Rate:  [77-78] 78 (11/07 0653) Resp:  [20] 20 (11/07 0653) BP: (147-158)/(62-70) 147/62 mmHg (11/07 0653) SpO2:  [95 %-98 %] 95 % (11/07 0653) Weight change:  Last BM Date: 12/27/14  Intake/Output from previous day: 11/06 0701 - 11/07 0700 In: 720 [P.O.:720] Out: 2875 [Urine:2875]  PHYSICAL EXAM General appearance: alert, cooperative, mild distress and morbidly obese Resp: clear to auscultation bilaterally Cardio: regular rate and rhythm, S1, S2 normal, no murmur, click, rub or gallop GI: soft, non-tender; bowel sounds normal; no masses,  no organomegaly Extremities: The wound is still drainage but it seems minimal at this point  Lab Results:  Results for orders placed or performed during the hospital encounter of 12/25/14 (from the past 48 hour(s))  Glucose, capillary     Status: Abnormal   Collection Time: 12/26/14 11:39 AM  Result Value Ref Range   Glucose-Capillary 126 (H) 65 - 99 mg/dL   Comment 1 Notify RN    Comment 2 Document in Chart   Glucose, capillary     Status: Abnormal   Collection Time: 12/26/14  4:39 PM  Result Value Ref Range   Glucose-Capillary 109 (H) 65 - 99 mg/dL   Comment 1 Notify RN    Comment 2 Document in Chart   Glucose, capillary     Status: Abnormal   Collection Time: 12/26/14  8:27 PM  Result Value Ref Range   Glucose-Capillary 148 (H) 65 - 99 mg/dL   Comment 1 Notify RN    Comment 2 Document in Chart   Basic metabolic panel     Status: Abnormal   Collection Time: 12/27/14  4:50 AM  Result Value Ref Range   Sodium 142 135 - 145 mmol/L   Potassium 3.9 3.5 - 5.1 mmol/L   Chloride  108 101 - 111 mmol/L   CO2 26 22 - 32 mmol/L   Glucose, Bld 116 (H) 65 - 99 mg/dL   BUN 10 6 - 20 mg/dL   Creatinine, Ser 0.94 0.44 - 1.00 mg/dL   Calcium 8.6 (L) 8.9 - 10.3 mg/dL   GFR calc non Af Amer >60 >60 mL/min   GFR calc Af Amer >60 >60 mL/min    Comment: (NOTE) The eGFR has been calculated using the CKD EPI equation. This calculation has not been validated in all clinical situations. eGFR's persistently <60 mL/min signify possible Chronic Kidney Disease.    Anion gap 8 5 - 15  Glucose, capillary     Status: Abnormal   Collection Time: 12/27/14  8:05 AM  Result Value Ref Range   Glucose-Capillary 121 (H) 65 - 99 mg/dL  Glucose, capillary     Status: Abnormal   Collection Time: 12/27/14  1:43 PM  Result Value Ref Range   Glucose-Capillary 108 (H) 65 - 99 mg/dL  Glucose, capillary     Status: Abnormal   Collection Time: 12/27/14  5:14 PM  Result Value Ref Range   Glucose-Capillary 170 (H) 65 - 99 mg/dL  Glucose, capillary     Status: Abnormal   Collection Time: 12/27/14 10:17 PM  Result Value Ref Range   Glucose-Capillary  115 (H) 65 - 99 mg/dL   Comment 1 Notify RN    Comment 2 Document in Chart   Basic metabolic panel     Status: Abnormal   Collection Time: 12/28/14  5:30 AM  Result Value Ref Range   Sodium 146 (H) 135 - 145 mmol/L   Potassium 3.9 3.5 - 5.1 mmol/L   Chloride 109 101 - 111 mmol/L   CO2 27 22 - 32 mmol/L   Glucose, Bld 114 (H) 65 - 99 mg/dL   BUN 10 6 - 20 mg/dL   Creatinine, Ser 0.96 0.44 - 1.00 mg/dL   Calcium 8.6 (L) 8.9 - 10.3 mg/dL   GFR calc non Af Amer >60 >60 mL/min   GFR calc Af Amer >60 >60 mL/min    Comment: (NOTE) The eGFR has been calculated using the CKD EPI equation. This calculation has not been validated in all clinical situations. eGFR's persistently <60 mL/min signify possible Chronic Kidney Disease.    Anion gap 10 5 - 15  Vancomycin, trough     Status: None   Collection Time: 12/28/14  5:30 AM  Result Value Ref Range    Vancomycin Tr 19 10.0 - 20.0 ug/mL  CBC     Status: Abnormal   Collection Time: 12/28/14  5:30 AM  Result Value Ref Range   WBC 8.1 4.0 - 10.5 K/uL   RBC 4.12 3.87 - 5.11 MIL/uL   Hemoglobin 10.5 (L) 12.0 - 15.0 g/dL   HCT 35.1 (L) 36.0 - 46.0 %   MCV 85.2 78.0 - 100.0 fL   MCH 25.5 (L) 26.0 - 34.0 pg   MCHC 29.9 (L) 30.0 - 36.0 g/dL   RDW 14.6 11.5 - 15.5 %   Platelets 224 150 - 400 K/uL  Glucose, capillary     Status: Abnormal   Collection Time: 12/28/14  7:56 AM  Result Value Ref Range   Glucose-Capillary 108 (H) 65 - 99 mg/dL    ABGS No results for input(s): PHART, PO2ART, TCO2, HCO3 in the last 72 hours.  Invalid input(s): PCO2 CULTURES Recent Results (from the past 240 hour(s))  Culture, blood (routine x 2)     Status: None (Preliminary result)   Collection Time: 12/25/14 12:47 PM  Result Value Ref Range Status   Specimen Description RIGHT ANTECUBITAL  Final   Special Requests BOTTLES DRAWN AEROBIC AND ANAEROBIC  [place]  Final   Culture NO GROWTH 2 DAYS  Final   Report Status PENDING  Incomplete  Urine culture     Status: None   Collection Time: 12/25/14 12:48 PM  Result Value Ref Range Status   Specimen Description URINE, CATHETERIZED  Final   Special Requests NONE  Final   Culture   Final    NO GROWTH 1 DAY Performed at [facility]    Report Status 12/26/2014 FINAL  Final  Culture, blood (routine x 2)     Status: None (Preliminary result)   Collection Time: 12/25/14 12:54 PM  Result Value Ref Range Status   Specimen Description LEFT ANTECUBITAL  Final   Special Requests BOTTLES DRAWN AEROBIC AND ANAEROBIC [place]  Final   Culture NO GROWTH 2 DAYS  Final   Report Status PENDING  Incomplete   Studies/Results: No results found.  Medications:  Prior to Admission:  Prescriptions prior to admission  Medication Sig Dispense Refill Last Dose  . CELEBREX 200 MG capsule Take 200 mg by mouth daily.    12/25/2014 at Unknown time  .  glyBURIDE-metformin  (GLUCOVANCE) 5-500 MG per tablet Take 1 tablet by mouth 3 (three) times daily.    12/25/2014 at Unknown time  . oxyCODONE-acetaminophen (PERCOCET/ROXICET) 5-325 MG per tablet Take 1 tablet by mouth 4 (four) times daily.    12/25/2014 at Unknown time  . trandolapril (MAVIK) 4 MG tablet Take 4 mg by mouth daily.    12/25/2014 at Unknown time  . verapamil (CALAN-SR) 240 MG CR tablet Take 240 mg by mouth daily.    12/25/2014 at Unknown time  . XARELTO 20 MG TABS Take 20 mg by mouth daily with supper.    12/24/2014 at 1700   Scheduled: . insulin aspart  0-15 Units Subcutaneous TID WC  . insulin aspart  0-5 Units Subcutaneous QHS  . oxyCODONE-acetaminophen  1 tablet Oral 4 times per day  . piperacillin-tazobactam (ZOSYN)  IV  3.375 g Intravenous Q8H  . rivaroxaban  20 mg Oral Q supper  . sodium chloride  10-40 mL Intracatheter Q12H  . sodium chloride  3 mL Intravenous Q12H  . trandolapril  4 mg Oral Daily  . vancomycin  2,000 mg Intravenous Q24H  . verapamil  240 mg Oral Daily   Continuous:  FTN:[id number] **OR** acetaminophen, ondansetron **OR** ondansetron (ZOFRAN) IV, sodium chloride  Assesment: She was admitted with sepsis from cellulitis of her leg. 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 She is improving but I don't think she is quite ready for discharge. She has diabetes which is pretty well controlled. She has morbid obesity which complicates her situation. She has a Foley catheter in place because she was having incontinence of urine and urinating on the wound and she may need to go home with that. Active Problems:   Type 2 diabetes mellitus ([place])   Essential hypertension, benign   Morbid obesity ([place])   Fever, unspecified   Sepsis ([place])   Cellulitis    Plan: Probable discharge tomorrow with home health services    LOS: 3 days   Sheila Gervasi L 12/28/2014, 8:29  AM

## 2014-12-29 ENCOUNTER — Other Ambulatory Visit: Payer: Self-pay | Admitting: *Deleted

## 2014-12-29 LAB — GLUCOSE, CAPILLARY
GLUCOSE-CAPILLARY: 116 mg/dL — AB (ref 65–99)
Glucose-Capillary: 122 mg/dL — ABNORMAL HIGH (ref 65–99)
Glucose-Capillary: 118 mg/dL — ABNORMAL HIGH (ref 65–99)

## 2014-12-29 MED ORDER — HEPARIN SOD (PORK) LOCK FLUSH 100 UNIT/ML IV SOLN
250.0000 [IU] | INTRAVENOUS | Status: AC | PRN
  Administered 2014-12-29: 250 [IU]
  Filled 2014-12-29: qty 5

## 2014-12-29 NOTE — Clinical Social Work Note (Signed)
CSW arranged transport home via [place] at pt's request. Verified address and that family would be present upon arrival.  [name], [place]

## 2014-12-29 NOTE — Care Management Note (Signed)
Case Management Note  Patient Details  Name: NHU GLASBY MRN: [id number] Date of Birth: 1951-01-17  Expected Discharge Date:   12/29/2014               Expected Discharge Plan:  [place]  In-House Referral:  NA  Discharge planning Services  CM Consult  Post Acute Care Choice:  Home Health Choice offered to:  Patient  DME Arranged:    DME Agency:     HH Arranged:  RN [place] Agency:  [place]  Status of Service:  Completed, signed off  Medicare Important Message Given:  Yes-second notification given Date Medicare IM Given:    Medicare IM give by:    Date Additional Medicare IM Given:    Additional Medicare Important Message give by:     If discussed at [place] of Stay Meetings, dates discussed:    Additional Comments: Pt discharging home today with [facility] services for IV abx. Romualdo Bolk, of [facility], made aware and will obtain pt info from chart. [facility] aware pt will need next dose of IV abx on 12/30/2014. Pt reports she has walker and wheelchair at home. Pt has no further CM needs.   Sherald Barge, RN 12/29/2014, 11:13 AM

## 2014-12-29 NOTE — Progress Notes (Signed)
Patient with orders to be discharge home. Discharge instructions given, patient verbalized understanding. PICC line and Foley catheter in place and will be discharged with it as ordered. Patient stable. Patient left via EMS.

## 2014-12-29 NOTE — Consult Note (Signed)
   [facility] CM Inpatient Consult   12/29/2014  [name] May 11, 1950 [id number]   Spoke with patient and spouse at bedside regarding services with [facility]. Patient verbalized  interest in [facility] services. Patient signed consent form and packet with information given for disease management support or other services. Patient verbalizes understanding that she will receive post hospital follow up calls and be assessed for home visits. Inpatient case manager made aware. Of note, [facility] Care Management services does not replace or interfere with any services that are arranged by inpatient case management or social work.  For questions, please contact: Royetta Crochet. Laymond Purser, RN, BSN, [address] [facility] Liaison [phone]

## 2014-12-29 NOTE — Progress Notes (Signed)
Subjective: She feels better. She's ready for discharge. I think she is going to need the Foley catheter in place at least short-term at home to keep her from soiling her wound.  Objective: Vital signs in last 24 hours: Temp:  [98.1 F (36.7 C)-98.8 F (37.1 C)] 98.4 F (36.9 C) (11/08 0537) Pulse Rate:  [72-90] 85 (11/08 0537) Resp:  [20] 20 (11/08 0537) BP: (146-164)/(56-77) 146/77 mmHg (11/08 0537) SpO2:  [94 %-97 %] 97 % (11/08 0537) Weight change:  Last BM Date: 12/24/14  Intake/Output from previous day: 11/07 0701 - 11/08 0700 In: 1430 [P.O.:720; I.V.:10; IV Piggyback:700] Out: 3650 [Urine:3650]  PHYSICAL EXAM General appearance: alert, cooperative, mild distress and morbidly obese Resp: clear to auscultation bilaterally Cardio: regular rate and rhythm, S1, S2 normal, no murmur, click, rub or gallop GI: soft, non-tender; bowel sounds normal; no masses,  no organomegaly Extremities: The wound is cleaner than previously.  Lab Results:  Results for orders placed or performed during the hospital encounter of 12/25/14 (from the past 48 hour(s))  Glucose, capillary     Status: Abnormal   Collection Time: 12/27/14  1:43 PM  Result Value Ref Range   Glucose-Capillary 108 (H) 65 - 99 mg/dL  Glucose, capillary     Status: Abnormal   Collection Time: 12/27/14  5:14 PM  Result Value Ref Range   Glucose-Capillary 170 (H) 65 - 99 mg/dL  Glucose, capillary     Status: Abnormal   Collection Time: 12/27/14 10:17 PM  Result Value Ref Range   Glucose-Capillary 115 (H) 65 - 99 mg/dL   Comment 1 Notify RN    Comment 2 Document in Chart   Basic metabolic panel     Status: Abnormal   Collection Time: 12/28/14  5:30 AM  Result Value Ref Range   Sodium 146 (H) 135 - 145 mmol/L   Potassium 3.9 3.5 - 5.1 mmol/L   Chloride 109 101 - 111 mmol/L   CO2 27 22 - 32 mmol/L   Glucose, Bld 114 (H) 65 - 99 mg/dL   BUN 10 6 - 20 mg/dL   Creatinine, Ser 0.96 0.44 - 1.00 mg/dL   Calcium 8.6 (L)  8.9 - 10.3 mg/dL   GFR calc non Af Amer >60 >60 mL/min   GFR calc Af Amer >60 >60 mL/min    Comment: (NOTE) The eGFR has been calculated using the CKD EPI equation. This calculation has not been validated in all clinical situations. eGFR's persistently <60 mL/min signify possible Chronic Kidney Disease.    Anion gap 10 5 - 15  Vancomycin, trough     Status: None   Collection Time: 12/28/14  5:30 AM  Result Value Ref Range   Vancomycin Tr 19 10.0 - 20.0 ug/mL  CBC     Status: Abnormal   Collection Time: 12/28/14  5:30 AM  Result Value Ref Range   WBC 8.1 4.0 - 10.5 K/uL   RBC 4.12 3.87 - 5.11 MIL/uL   Hemoglobin 10.5 (L) 12.0 - 15.0 g/dL   HCT 35.1 (L) 36.0 - 46.0 %   MCV 85.2 78.0 - 100.0 fL   MCH 25.5 (L) 26.0 - 34.0 pg   MCHC 29.9 (L) 30.0 - 36.0 g/dL   RDW 14.6 11.5 - 15.5 %   Platelets 224 150 - 400 K/uL  Glucose, capillary     Status: Abnormal   Collection Time: 12/28/14  7:56 AM  Result Value Ref Range   Glucose-Capillary 108 (H) 65 - 99 mg/dL  Glucose, capillary     Status: Abnormal   Collection Time: 12/28/14 11:26 AM  Result Value Ref Range   Glucose-Capillary 129 (H) 65 - 99 mg/dL  Glucose, capillary     Status: Abnormal   Collection Time: 12/28/14  4:15 PM  Result Value Ref Range   Glucose-Capillary 155 (H) 65 - 99 mg/dL  Glucose, capillary     Status: Abnormal   Collection Time: 12/28/14  8:22 PM  Result Value Ref Range   Glucose-Capillary 129 (H) 65 - 99 mg/dL   Comment 1 Notify RN    Comment 2 Document in Chart   Glucose, capillary     Status: Abnormal   Collection Time: 12/29/14  7:24 AM  Result Value Ref Range   Glucose-Capillary 122 (H) 65 - 99 mg/dL   Comment 1 Notify RN    Comment 2 Document in Chart     ABGS No results for input(s): PHART, PO2ART, TCO2, HCO3 in the last 72 hours.  Invalid input(s): PCO2 CULTURES Recent Results (from the past 240 hour(s))  Culture, blood (routine x 2)     Status: None (Preliminary result)   Collection  Time: 12/25/14 12:47 PM  Result Value Ref Range Status   Specimen Description RIGHT ANTECUBITAL  Final   Special Requests BOTTLES DRAWN AEROBIC AND ANAEROBIC  [place]  Final   Culture NO GROWTH 3 DAYS  Final   Report Status PENDING  Incomplete  Urine culture     Status: None   Collection Time: 12/25/14 12:48 PM  Result Value Ref Range Status   Specimen Description URINE, CATHETERIZED  Final   Special Requests NONE  Final   Culture   Final    NO GROWTH 1 DAY Performed at [facility]    Report Status 12/26/2014 FINAL  Final  Culture, blood (routine x 2)     Status: None (Preliminary result)   Collection Time: 12/25/14 12:54 PM  Result Value Ref Range Status   Specimen Description LEFT ANTECUBITAL  Final   Special Requests BOTTLES DRAWN AEROBIC AND ANAEROBIC [place]  Final   Culture NO GROWTH 3 DAYS  Final   Report Status PENDING  Incomplete   Studies/Results: No results found.  Medications:  Prior to Admission:  Prescriptions prior to admission  Medication Sig Dispense Refill Last Dose  . CELEBREX 200 MG capsule Take 200 mg by mouth daily.    12/25/2014 at Unknown time  . glyBURIDE-metformin (GLUCOVANCE) 5-500 MG per tablet Take 1 tablet by mouth 3 (three) times daily.    12/25/2014 at Unknown time  . oxyCODONE-acetaminophen (PERCOCET/ROXICET) 5-325 MG per tablet Take 1 tablet by mouth 4 (four) times daily.    12/25/2014 at Unknown time  . trandolapril (MAVIK) 4 MG tablet Take 4 mg by mouth daily.    12/25/2014 at Unknown time  . verapamil (CALAN-SR) 240 MG CR tablet Take 240 mg by mouth daily.    12/25/2014 at Unknown time  . XARELTO 20 MG TABS Take 20 mg by mouth daily with supper.    12/24/2014 at 1700   Scheduled: . insulin aspart  0-15 Units Subcutaneous TID WC  . insulin aspart  0-5 Units Subcutaneous QHS  . oxyCODONE-acetaminophen  1 tablet Oral 4 times per day  . piperacillin-tazobactam (ZOSYN)  IV  3.375 g Intravenous Q8H  . rivaroxaban  20 mg Oral Q supper  .  sodium chloride  10-40 mL Intracatheter Q12H  . sodium chloride  3 mL Intravenous Q12H  .  trandolapril  4 mg Oral Daily  . vancomycin  2,000 mg Intravenous Q24H  . verapamil  240 mg Oral Daily   Continuous:  IWU:AOUMNARUOOWIN **OR** acetaminophen, heparin lock flush, ondansetron **OR** ondansetron (ZOFRAN) IV, sodium chloride  Assesment: She was admitted with cellulitis this is in a chronic abscess of her leg. She had sepsis from that. She is markedly improved. She says when she gets really sick and has trouble moving around she frequently has incontinence on going to send her home with Foley catheter for now. Active Problems:   Type 2 diabetes mellitus (HCC)   Essential hypertension, benign   Morbid obesity (HCC)   Fever, unspecified   Sepsis ([place])   Cellulitis    Plan: Discharge home. She will need at least 10 days of IV antibiotics.    LOS: 4 days   Lavalle Skoda L 12/29/2014, 8:37 AM

## 2014-12-29 NOTE — Discharge Summary (Signed)
Physician Discharge Summary  Patient ID: [name] MRN: [id number] DOB/AGE: November 27, 1950 64 y.o. Primary Care Physician:Katianna Mcclenney L, MD Admit date: 12/25/2014 Discharge date: 12/29/2014    Discharge Diagnoses:   Active Problems:   Type 2 diabetes mellitus (HCC)   Essential hypertension, benign   Morbid obesity (HCC)   Fever, unspecified   Sepsis ([place])   Cellulitis     Medication List    TAKE these medications        CELEBREX 200 MG capsule  Generic drug:  celecoxib  Take 200 mg by mouth daily.     glyBURIDE-metformin 5-500 MG tablet  Commonly known as:  GLUCOVANCE  Take 1 tablet by mouth 3 (three) times daily.     oxyCODONE-acetaminophen 5-325 MG tablet  Commonly known as:  PERCOCET/ROXICET  Take 1 tablet by mouth 4 (four) times daily.     trandolapril 4 MG tablet  Commonly known as:  MAVIK  Take 4 mg by mouth daily.     verapamil 240 MG CR tablet  Commonly known as:  CALAN-SR  Take 240 mg by mouth daily.     XARELTO 20 MG Tabs tablet  Generic drug:  rivaroxaban  Take 20 mg by mouth daily with supper.        Discharged Condition: Improved    Consults: None  Significant Diagnostic Studies: Dg Chest 1 View  12/25/2014  CLINICAL DATA:  Fever EXAM: CHEST 1 VIEW COMPARISON:  March 18, 2007 FINDINGS: There is no edema or consolidation. Heart is upper normal in size with pulmonary vascularity within normal limits. No adenopathy. No pneumothorax. No bone lesions. IMPRESSION: No edema or consolidation. Electronically Signed   By: Lowella Grip III M.D.   On: 12/25/2014 14:01   Dg Knee Complete 4 Views Left  12/25/2014  CLINICAL DATA:  Fever.  Knee pain. EXAM: LEFT KNEE - COMPLETE 4+ VIEW COMPARISON:  None. FINDINGS: No joint effusion identified. Severe tricompartment osteoarthritis is noted with marked medial compartment and patellofemoral compartment narrowing and marginal spur formation. No fractures or subluxations identified. No radiopaque foreign  bodies are soft tissue calcifications. IMPRESSION: 1. Severe osteoarthritis. Electronically Signed   By: Kerby Moors M.D.   On: 12/25/2014 14:05   Dg Knee Complete 4 Views Right  12/25/2014  CLINICAL DATA:  Pain following fall EXAM: RIGHT KNEE - COMPLETE 4+ VIEW COMPARISON:  December 26, 2004 FINDINGS: Frontal, bilateral oblique, and lateral views were obtained. There is no fracture or dislocation. No joint effusion. There is marked narrowing medially with virtually complete loss of disc space medially. There is moderate narrowing in the patellofemoral joint with slightly less narrowing laterally. There is spurring in all compartments, most marked medially. There are multiple foci of arterial vascular calcification. No erosive change. IMPRESSION: Extensive osteoarthritic change, most marked medially. No fracture or effusion. Extensive arterial vascular calcification present. Electronically Signed   By: Lowella Grip III M.D.   On: 12/25/2014 14:05   Dg Hips Bilat With Pelvis 3-4 Views  12/25/2014  CLINICAL DATA:  64 year old who spotted and fell onto the floor earlier today, complaining of pelvic and hip pain. Initial encounter. EXAM: DG HIP (WITH OR WITHOUT PELVIS) 3-4V BILAT COMPARISON:  None. FINDINGS: No acute fractures identified. Hip joints intact with symmetric moderate to severe inferomedial joint space narrowing. Sacroiliac joints and symphysis pubis intact. Mild osseous demineralization. IMPRESSION: 1. No acute osseous abnormality. 2. 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 Electronically Signed   By: Sherran Needs.D.  On: 12/25/2014 14:04    Lab Results: Basic Metabolic Panel:  Recent Labs  12/27/14 0450 12/28/14 0530  NA 142 146*  K 3.9 3.9  CL 108 109  CO2 26 27  GLUCOSE 116* 114*  BUN 10 10  CREATININE 0.94 0.96  CALCIUM 8.6* 8.6*   Liver Function Tests: No results for input(s): AST, ALT, ALKPHOS, BILITOT, PROT, ALBUMIN in the last 72  hours.   CBC:  Recent Labs  12/28/14 0530  WBC 8.1  HGB 10.5*  HCT 35.1*  MCV 85.2  PLT 224    Recent Results (from the past 240 hour(s))  Culture, blood (routine x 2)     Status: None (Preliminary result)   Collection Time: 12/25/14 12:47 PM  Result Value Ref Range Status   Specimen Description RIGHT ANTECUBITAL  Final   Special Requests BOTTLES DRAWN AEROBIC AND ANAEROBIC  [place]  Final   Culture NO GROWTH 3 DAYS  Final   Report Status PENDING  Incomplete  Urine culture     Status: None   Collection Time: 12/25/14 12:48 PM  Result Value Ref Range Status   Specimen Description URINE, CATHETERIZED  Final   Special Requests NONE  Final   Culture   Final    NO GROWTH 1 DAY Performed at [facility]    Report Status 12/26/2014 FINAL  Final  Culture, blood (routine x 2)     Status: None (Preliminary result)   Collection Time: 12/25/14 12:54 PM  Result Value Ref Range Status   Specimen Description LEFT ANTECUBITAL  Final   Special Requests BOTTLES DRAWN AEROBIC AND ANAEROBIC [place]  Final   Culture NO GROWTH 3 DAYS  Final   Report Status PENDING  Incomplete     Hospital Course: This is a 64 year old who came to the emergency department because of fever. She has a chronic wound on her right leg. She has seen plastic surgery and multiple other physicians but she is not a candidate for any further definitive treatment because of her obesity. She was found to be septic in the emergency department and was treated with antibiotics and IV fluids. The wound is draining purulent material. She was incontinent of urine so she had Foley catheter placed. She is chronically anticoagulated because of a previous episode of DVT/pulmonary emboli and her very poor ambulatory status. She was treated with antibiotics wound care consultation was obtained and she improved. She is going to be discharged home for 10 more days of IV antibiotics and then reassess. She will go home with Foley  catheter for now  Discharge Exam: Blood pressure 146/77, pulse 85, temperature 98.4 F (36.9 C), temperature source Oral, resp. rate 20, height 5\' 6"  (1.676 m), weight 147.8 kg (325 lb 13.4 oz), SpO2 97 %. She is morbidly obese. Her chest is clear. Her heart is regular. The wound looks better.  Disposition: Home with home health services      Discharge Instructions    Discharge patient    Complete by:  As directed      Face-to-face encounter (required for Medicare/Medicaid patients)    Complete by:  As directed   I Juandiego Kolenovic L certify that this patient is under my care and that I, or a nurse practitioner or physician's assistant working with me, had a face-to-face encounter that meets the physician face-to-face encounter requirements with this patient on 12/29/2014. The encounter with the patient was in whole, or in part for the following medical condition(s) which is  the primary reason for home health care (List medical condition): wound infection  The encounter with the patient was in whole, or in part, for the following medical condition, which is the primary reason for home health care:  wound infection  I certify that, based on my findings, the following services are medically necessary home health services:   Nursing Physical therapy    Reason for Medically Necessary Home Health Services:  Skilled Nursing- Change/Decline in Patient Status  My clinical findings support the need for the above services:  Unable to leave home safely without assistance and/or assistive device  Further, I certify that my clinical findings support that this patient is homebound due to:  Unable to leave home safely without assistance     Home Health    Complete by:  As directed   To provide the following care/treatments:   PT RN    Leave Foley catheter for now. Change dressing daily. Vancomycin 2 grams IV daily. Use [facility] protocol for lab  Catheter care, and PICC care     Home Health    Complete by:  As  directed   To provide the following care/treatments:   PT RN    Treatment wound with silver Hydrofiber and cover with gauze and tape. Please change daily             Signed: Dayten Juba L   12/29/2014, 8:39 AM

## 2014-12-29 NOTE — Patient Outreach (Signed)
[facility]) Care Management  12/29/2014  [name] 02-17-1951 [id number]   Request from Burgess Amor, RN to assign Community RN, assigned Janalyn Shy, RN.  Thanks, Ronnell Freshwater. [place], [place] Assistant Phone: [phone] Fax: [phone]

## 2014-12-29 NOTE — Care Management Important Message (Signed)
Important Message  Patient Details  Name: [name] MRN: [id number] Date of Birth: [birth year]   Medicare Important Message Given:  Yes-second notification given    Sherald Barge, RN 12/29/2014, 11:12 AM

## 2014-12-30 DIAGNOSIS — L98491 Non-pressure chronic ulcer of skin of other sites limited to breakdown of skin: Secondary | ICD-10-CM | POA: Diagnosis not present

## 2014-12-30 DIAGNOSIS — M1990 Unspecified osteoarthritis, unspecified site: Secondary | ICD-10-CM | POA: Diagnosis not present

## 2014-12-30 DIAGNOSIS — Z792 Long term (current) use of antibiotics: Secondary | ICD-10-CM | POA: Diagnosis not present

## 2014-12-30 DIAGNOSIS — L03115 Cellulitis of right lower limb: Secondary | ICD-10-CM | POA: Diagnosis not present

## 2014-12-30 DIAGNOSIS — Z9181 History of falling: Secondary | ICD-10-CM | POA: Diagnosis not present

## 2014-12-30 DIAGNOSIS — Z466 Encounter for fitting and adjustment of urinary device: Secondary | ICD-10-CM | POA: Diagnosis not present

## 2014-12-30 DIAGNOSIS — I890 Lymphedema, not elsewhere classified: Secondary | ICD-10-CM | POA: Diagnosis not present

## 2014-12-30 DIAGNOSIS — Z8614 Personal history of Methicillin resistant Staphylococcus aureus infection: Secondary | ICD-10-CM | POA: Diagnosis not present

## 2014-12-30 DIAGNOSIS — Z7901 Long term (current) use of anticoagulants: Secondary | ICD-10-CM | POA: Diagnosis not present

## 2014-12-30 DIAGNOSIS — Z86718 Personal history of other venous thrombosis and embolism: Secondary | ICD-10-CM | POA: Diagnosis not present

## 2014-12-30 DIAGNOSIS — Z86711 Personal history of pulmonary embolism: Secondary | ICD-10-CM | POA: Diagnosis not present

## 2014-12-30 DIAGNOSIS — E11622 Type 2 diabetes mellitus with other skin ulcer: Secondary | ICD-10-CM | POA: Diagnosis not present

## 2014-12-30 DIAGNOSIS — L97112 Non-pressure chronic ulcer of right thigh with fat layer exposed: Secondary | ICD-10-CM | POA: Diagnosis not present

## 2014-12-30 DIAGNOSIS — I10 Essential (primary) hypertension: Secondary | ICD-10-CM | POA: Diagnosis not present

## 2014-12-30 DIAGNOSIS — Z452 Encounter for adjustment and management of vascular access device: Secondary | ICD-10-CM | POA: Diagnosis not present

## 2014-12-31 ENCOUNTER — Other Ambulatory Visit: Payer: Self-pay | Admitting: *Deleted

## 2014-12-31 ENCOUNTER — Encounter: Payer: Self-pay | Admitting: *Deleted

## 2014-12-31 DIAGNOSIS — I890 Lymphedema, not elsewhere classified: Secondary | ICD-10-CM | POA: Diagnosis not present

## 2014-12-31 DIAGNOSIS — I10 Essential (primary) hypertension: Secondary | ICD-10-CM | POA: Diagnosis not present

## 2014-12-31 DIAGNOSIS — L97112 Non-pressure chronic ulcer of right thigh with fat layer exposed: Secondary | ICD-10-CM | POA: Diagnosis not present

## 2014-12-31 DIAGNOSIS — L03115 Cellulitis of right lower limb: Secondary | ICD-10-CM | POA: Diagnosis not present

## 2014-12-31 DIAGNOSIS — L98491 Non-pressure chronic ulcer of skin of other sites limited to breakdown of skin: Secondary | ICD-10-CM | POA: Diagnosis not present

## 2014-12-31 DIAGNOSIS — E11622 Type 2 diabetes mellitus with other skin ulcer: Secondary | ICD-10-CM | POA: Diagnosis not present

## 2015-01-01 LAB — CULTURE, BLOOD (ROUTINE X 2)
CULTURE: NO GROWTH
Culture: NO GROWTH

## 2015-01-04 ENCOUNTER — Other Ambulatory Visit (HOSPITAL_COMMUNITY)
Admission: RE | Admit: 2015-01-04 | Discharge: 2015-01-04 | Disposition: A | Discharge status: Home / Self Care | Payer: Medicare Other | Source: Other Acute Inpatient Hospital | Attending: Pulmonary Disease | Admitting: Pulmonary Disease

## 2015-01-04 DIAGNOSIS — L98491 Non-pressure chronic ulcer of skin of other sites limited to breakdown of skin: Secondary | ICD-10-CM | POA: Diagnosis not present

## 2015-01-04 DIAGNOSIS — E11622 Type 2 diabetes mellitus with other skin ulcer: Secondary | ICD-10-CM | POA: Diagnosis not present

## 2015-01-04 DIAGNOSIS — I890 Lymphedema, not elsewhere classified: Secondary | ICD-10-CM | POA: Diagnosis not present

## 2015-01-04 DIAGNOSIS — I10 Essential (primary) hypertension: Secondary | ICD-10-CM | POA: Diagnosis not present

## 2015-01-04 DIAGNOSIS — Z5181 Encounter for therapeutic drug level monitoring: Secondary | ICD-10-CM | POA: Diagnosis not present

## 2015-01-04 DIAGNOSIS — L03115 Cellulitis of right lower limb: Secondary | ICD-10-CM | POA: Diagnosis not present

## 2015-01-04 DIAGNOSIS — A419 Sepsis, unspecified organism: Secondary | ICD-10-CM | POA: Insufficient documentation

## 2015-01-04 DIAGNOSIS — L97112 Non-pressure chronic ulcer of right thigh with fat layer exposed: Secondary | ICD-10-CM | POA: Diagnosis not present

## 2015-01-04 LAB — CBC WITH DIFFERENTIAL/PLATELET
BASOS ABS: 0 10*3/uL (ref 0.0–0.1)
Basophils Relative: 0 %
EOS ABS: 0.5 10*3/uL (ref 0.0–0.7)
Eosinophils Relative: 3 %
HCT: 35.6 % — ABNORMAL LOW (ref 36.0–46.0)
Hemoglobin: 10.6 g/dL — ABNORMAL LOW (ref 12.0–15.0)
LYMPHS ABS: 1.7 10*3/uL (ref 0.7–4.0)
Lymphocytes Relative: 11 %
MCH: 26 pg (ref 26.0–34.0)
MCHC: 29.8 g/dL — AB (ref 30.0–36.0)
MCV: 87.3 fL (ref 78.0–100.0)
MONO ABS: 0.5 10*3/uL (ref 0.1–1.0)
MONOS PCT: 3 %
Neutro Abs: 12.3 10*3/uL — ABNORMAL HIGH (ref 1.7–7.7)
Neutrophils Relative %: 83 %
Platelets: 297 10*3/uL (ref 150–400)
RBC: 4.08 MIL/uL (ref 3.87–5.11)
RDW: 15.2 % (ref 11.5–15.5)
WBC: 15 10*3/uL — AB (ref 4.0–10.5)

## 2015-01-04 LAB — CREATININE, SERUM
CREATININE: 2.27 mg/dL — AB (ref 0.44–1.00)
GFR calc Af Amer: 25 mL/min — ABNORMAL LOW (ref >60)
GFR calc non Af Amer: 22 mL/min — ABNORMAL LOW (ref >60)

## 2015-01-04 LAB — BUN: BUN: 37 mg/dL — AB (ref 6–20)

## 2015-01-05 DIAGNOSIS — I890 Lymphedema, not elsewhere classified: Secondary | ICD-10-CM | POA: Diagnosis not present

## 2015-01-05 DIAGNOSIS — L03115 Cellulitis of right lower limb: Secondary | ICD-10-CM | POA: Diagnosis not present

## 2015-01-05 DIAGNOSIS — L98491 Non-pressure chronic ulcer of skin of other sites limited to breakdown of skin: Secondary | ICD-10-CM | POA: Diagnosis not present

## 2015-01-05 DIAGNOSIS — L97112 Non-pressure chronic ulcer of right thigh with fat layer exposed: Secondary | ICD-10-CM | POA: Diagnosis not present

## 2015-01-05 DIAGNOSIS — E11622 Type 2 diabetes mellitus with other skin ulcer: Secondary | ICD-10-CM | POA: Diagnosis not present

## 2015-01-05 DIAGNOSIS — I10 Essential (primary) hypertension: Secondary | ICD-10-CM | POA: Diagnosis not present

## 2015-01-07 ENCOUNTER — Encounter (HOSPITAL_COMMUNITY)
Admission: RE | Admit: 2015-01-07 | Discharge: 2015-01-07 | Disposition: A | Discharge status: Home / Self Care | Payer: Medicare Other | Source: Skilled Nursing Facility | Attending: Pulmonary Disease | Admitting: Pulmonary Disease

## 2015-01-07 DIAGNOSIS — I10 Essential (primary) hypertension: Secondary | ICD-10-CM | POA: Diagnosis not present

## 2015-01-07 DIAGNOSIS — E11622 Type 2 diabetes mellitus with other skin ulcer: Secondary | ICD-10-CM | POA: Diagnosis not present

## 2015-01-07 DIAGNOSIS — I890 Lymphedema, not elsewhere classified: Secondary | ICD-10-CM | POA: Diagnosis not present

## 2015-01-07 DIAGNOSIS — L98491 Non-pressure chronic ulcer of skin of other sites limited to breakdown of skin: Secondary | ICD-10-CM | POA: Diagnosis not present

## 2015-01-07 DIAGNOSIS — L97112 Non-pressure chronic ulcer of right thigh with fat layer exposed: Secondary | ICD-10-CM | POA: Diagnosis not present

## 2015-01-07 DIAGNOSIS — A419 Sepsis, unspecified organism: Secondary | ICD-10-CM | POA: Diagnosis not present

## 2015-01-07 DIAGNOSIS — Z5181 Encounter for therapeutic drug level monitoring: Secondary | ICD-10-CM | POA: Diagnosis not present

## 2015-01-07 DIAGNOSIS — L03115 Cellulitis of right lower limb: Secondary | ICD-10-CM | POA: Diagnosis not present

## 2015-01-07 LAB — CBC WITH DIFFERENTIAL/PLATELET
BASOS ABS: 0 10*3/uL (ref 0.0–0.1)
BASOS PCT: 0 %
EOS PCT: 3 %
Eosinophils Absolute: 0.4 10*3/uL (ref 0.0–0.7)
HCT: 35.5 % — ABNORMAL LOW (ref 36.0–46.0)
Hemoglobin: 10.6 g/dL — ABNORMAL LOW (ref 12.0–15.0)
LYMPHS PCT: 12 %
Lymphs Abs: 1.5 10*3/uL (ref 0.7–4.0)
MCH: 25.7 pg — ABNORMAL LOW (ref 26.0–34.0)
MCHC: 29.9 g/dL — ABNORMAL LOW (ref 30.0–36.0)
MCV: 86 fL (ref 78.0–100.0)
Monocytes Absolute: 0.5 10*3/uL (ref 0.1–1.0)
Monocytes Relative: 4 %
Neutro Abs: 10.7 10*3/uL — ABNORMAL HIGH (ref 1.7–7.7)
Neutrophils Relative %: 81 %
PLATELETS: 275 10*3/uL (ref 150–400)
RBC: 4.13 MIL/uL (ref 3.87–5.11)
RDW: 15.1 % (ref 11.5–15.5)
WBC: 13.1 10*3/uL — AB (ref 4.0–10.5)

## 2015-01-07 LAB — BASIC METABOLIC PANEL
ANION GAP: 10 (ref 5–15)
BUN: 34 mg/dL — AB (ref 6–20)
CALCIUM: 9 mg/dL (ref 8.9–10.3)
CO2: 25 mmol/L (ref 22–32)
Chloride: 110 mmol/L (ref 101–111)
Creatinine, Ser: 2.55 mg/dL — ABNORMAL HIGH (ref 0.44–1.00)
GFR calc Af Amer: 22 mL/min — ABNORMAL LOW (ref >60)
GFR, EST NON AFRICAN AMERICAN: 19 mL/min — AB (ref >60)
Glucose, Bld: 55 mg/dL — ABNORMAL LOW (ref 65–99)
POTASSIUM: 5.2 mmol/L — AB (ref 3.5–5.1)
SODIUM: 145 mmol/L (ref 135–145)

## 2015-01-09 DIAGNOSIS — L03115 Cellulitis of right lower limb: Secondary | ICD-10-CM | POA: Diagnosis not present

## 2015-01-09 DIAGNOSIS — L98491 Non-pressure chronic ulcer of skin of other sites limited to breakdown of skin: Secondary | ICD-10-CM | POA: Diagnosis not present

## 2015-01-09 DIAGNOSIS — I890 Lymphedema, not elsewhere classified: Secondary | ICD-10-CM | POA: Diagnosis not present

## 2015-01-09 DIAGNOSIS — E11622 Type 2 diabetes mellitus with other skin ulcer: Secondary | ICD-10-CM | POA: Diagnosis not present

## 2015-01-09 DIAGNOSIS — L97112 Non-pressure chronic ulcer of right thigh with fat layer exposed: Secondary | ICD-10-CM | POA: Diagnosis not present

## 2015-01-09 DIAGNOSIS — I10 Essential (primary) hypertension: Secondary | ICD-10-CM | POA: Diagnosis not present

## 2015-01-10 ENCOUNTER — Encounter: Payer: Self-pay | Admitting: *Deleted

## 2015-01-11 ENCOUNTER — Other Ambulatory Visit (HOSPITAL_COMMUNITY)
Admission: RE | Admit: 2015-01-11 | Discharge: 2015-01-11 | Disposition: A | Discharge status: Home / Self Care | Payer: Medicare Other | Source: Other Acute Inpatient Hospital | Attending: Pulmonary Disease | Admitting: Pulmonary Disease

## 2015-01-11 DIAGNOSIS — E11622 Type 2 diabetes mellitus with other skin ulcer: Secondary | ICD-10-CM | POA: Diagnosis not present

## 2015-01-11 DIAGNOSIS — A419 Sepsis, unspecified organism: Secondary | ICD-10-CM | POA: Insufficient documentation

## 2015-01-11 DIAGNOSIS — Z5181 Encounter for therapeutic drug level monitoring: Secondary | ICD-10-CM | POA: Diagnosis not present

## 2015-01-11 DIAGNOSIS — L97112 Non-pressure chronic ulcer of right thigh with fat layer exposed: Secondary | ICD-10-CM | POA: Diagnosis not present

## 2015-01-11 DIAGNOSIS — I10 Essential (primary) hypertension: Secondary | ICD-10-CM | POA: Diagnosis not present

## 2015-01-11 DIAGNOSIS — L98491 Non-pressure chronic ulcer of skin of other sites limited to breakdown of skin: Secondary | ICD-10-CM | POA: Diagnosis not present

## 2015-01-11 DIAGNOSIS — R4781 Slurred speech: Secondary | ICD-10-CM | POA: Diagnosis not present

## 2015-01-11 DIAGNOSIS — L03115 Cellulitis of right lower limb: Secondary | ICD-10-CM | POA: Diagnosis not present

## 2015-01-11 DIAGNOSIS — E162 Hypoglycemia, unspecified: Secondary | ICD-10-CM | POA: Diagnosis not present

## 2015-01-11 DIAGNOSIS — R402411 Glasgow coma scale score 13-15, in the field [EMT or ambulance]: Secondary | ICD-10-CM | POA: Diagnosis not present

## 2015-01-11 DIAGNOSIS — I890 Lymphedema, not elsewhere classified: Secondary | ICD-10-CM | POA: Diagnosis not present

## 2015-01-11 LAB — CBC WITH DIFFERENTIAL/PLATELET
BASOS ABS: 0 10*3/uL (ref 0.0–0.1)
BASOS PCT: 0 %
EOS PCT: 3 %
Eosinophils Absolute: 0.3 10*3/uL (ref 0.0–0.7)
HEMATOCRIT: 37.3 % (ref 36.0–46.0)
Hemoglobin: 11.3 g/dL — ABNORMAL LOW (ref 12.0–15.0)
LYMPHS PCT: 13 %
Lymphs Abs: 1.3 10*3/uL (ref 0.7–4.0)
MCH: 25.7 pg — ABNORMAL LOW (ref 26.0–34.0)
MCHC: 30.3 g/dL (ref 30.0–36.0)
MCV: 84.8 fL (ref 78.0–100.0)
MONO ABS: 0.6 10*3/uL (ref 0.1–1.0)
Monocytes Relative: 6 %
NEUTROS ABS: 8 10*3/uL — AB (ref 1.7–7.7)
Neutrophils Relative %: 78 %
PLATELETS: 297 10*3/uL (ref 150–400)
RBC: 4.4 MIL/uL (ref 3.87–5.11)
RDW: 15.5 % (ref 11.5–15.5)
WBC: 10.1 10*3/uL (ref 4.0–10.5)

## 2015-01-11 LAB — BASIC METABOLIC PANEL
ANION GAP: 9 (ref 5–15)
BUN: 21 mg/dL — ABNORMAL HIGH (ref 6–20)
CALCIUM: 9 mg/dL (ref 8.9–10.3)
CO2: 28 mmol/L (ref 22–32)
Chloride: 105 mmol/L (ref 101–111)
Creatinine, Ser: 2.07 mg/dL — ABNORMAL HIGH (ref 0.44–1.00)
GFR, EST AFRICAN AMERICAN: 28 mL/min — AB (ref >60)
GFR, EST NON AFRICAN AMERICAN: 24 mL/min — AB (ref >60)
GLUCOSE: 30 mg/dL — AB (ref 65–99)
POTASSIUM: 4.7 mmol/L (ref 3.5–5.1)
Sodium: 142 mmol/L (ref 135–145)

## 2015-01-13 ENCOUNTER — Other Ambulatory Visit: Payer: Self-pay | Admitting: *Deleted

## 2015-01-13 DIAGNOSIS — L03115 Cellulitis of right lower limb: Secondary | ICD-10-CM | POA: Diagnosis not present

## 2015-01-13 DIAGNOSIS — L98491 Non-pressure chronic ulcer of skin of other sites limited to breakdown of skin: Secondary | ICD-10-CM | POA: Diagnosis not present

## 2015-01-13 DIAGNOSIS — L97112 Non-pressure chronic ulcer of right thigh with fat layer exposed: Secondary | ICD-10-CM | POA: Diagnosis not present

## 2015-01-13 DIAGNOSIS — I10 Essential (primary) hypertension: Secondary | ICD-10-CM | POA: Diagnosis not present

## 2015-01-13 DIAGNOSIS — I890 Lymphedema, not elsewhere classified: Secondary | ICD-10-CM | POA: Diagnosis not present

## 2015-01-13 DIAGNOSIS — E11622 Type 2 diabetes mellitus with other skin ulcer: Secondary | ICD-10-CM | POA: Diagnosis not present

## 2015-01-15 ENCOUNTER — Other Ambulatory Visit: Payer: Self-pay | Admitting: *Deleted

## 2015-01-15 DIAGNOSIS — E11622 Type 2 diabetes mellitus with other skin ulcer: Secondary | ICD-10-CM | POA: Diagnosis not present

## 2015-01-15 DIAGNOSIS — I890 Lymphedema, not elsewhere classified: Secondary | ICD-10-CM | POA: Diagnosis not present

## 2015-01-15 DIAGNOSIS — L98491 Non-pressure chronic ulcer of skin of other sites limited to breakdown of skin: Secondary | ICD-10-CM | POA: Diagnosis not present

## 2015-01-15 DIAGNOSIS — L97112 Non-pressure chronic ulcer of right thigh with fat layer exposed: Secondary | ICD-10-CM | POA: Diagnosis not present

## 2015-01-15 DIAGNOSIS — L03115 Cellulitis of right lower limb: Secondary | ICD-10-CM | POA: Diagnosis not present

## 2015-01-15 DIAGNOSIS — I10 Essential (primary) hypertension: Secondary | ICD-10-CM | POA: Diagnosis not present

## 2015-01-18 ENCOUNTER — Other Ambulatory Visit: Payer: Self-pay | Admitting: *Deleted

## 2015-01-18 DIAGNOSIS — L97112 Non-pressure chronic ulcer of right thigh with fat layer exposed: Secondary | ICD-10-CM | POA: Diagnosis not present

## 2015-01-18 DIAGNOSIS — E11622 Type 2 diabetes mellitus with other skin ulcer: Secondary | ICD-10-CM | POA: Diagnosis not present

## 2015-01-18 DIAGNOSIS — L03115 Cellulitis of right lower limb: Secondary | ICD-10-CM | POA: Diagnosis not present

## 2015-01-18 DIAGNOSIS — I890 Lymphedema, not elsewhere classified: Secondary | ICD-10-CM | POA: Diagnosis not present

## 2015-01-18 DIAGNOSIS — I10 Essential (primary) hypertension: Secondary | ICD-10-CM | POA: Diagnosis not present

## 2015-01-18 DIAGNOSIS — L98491 Non-pressure chronic ulcer of skin of other sites limited to breakdown of skin: Secondary | ICD-10-CM | POA: Diagnosis not present

## 2015-01-20 DIAGNOSIS — L97112 Non-pressure chronic ulcer of right thigh with fat layer exposed: Secondary | ICD-10-CM | POA: Diagnosis not present

## 2015-01-20 DIAGNOSIS — I890 Lymphedema, not elsewhere classified: Secondary | ICD-10-CM | POA: Diagnosis not present

## 2015-01-20 DIAGNOSIS — I10 Essential (primary) hypertension: Secondary | ICD-10-CM | POA: Diagnosis not present

## 2015-01-20 DIAGNOSIS — L03115 Cellulitis of right lower limb: Secondary | ICD-10-CM | POA: Diagnosis not present

## 2015-01-20 DIAGNOSIS — E11622 Type 2 diabetes mellitus with other skin ulcer: Secondary | ICD-10-CM | POA: Diagnosis not present

## 2015-01-20 DIAGNOSIS — L98491 Non-pressure chronic ulcer of skin of other sites limited to breakdown of skin: Secondary | ICD-10-CM | POA: Diagnosis not present

## 2015-01-21 DIAGNOSIS — L97112 Non-pressure chronic ulcer of right thigh with fat layer exposed: Secondary | ICD-10-CM | POA: Diagnosis not present

## 2015-01-21 DIAGNOSIS — L03115 Cellulitis of right lower limb: Secondary | ICD-10-CM | POA: Diagnosis not present

## 2015-01-21 DIAGNOSIS — L98491 Non-pressure chronic ulcer of skin of other sites limited to breakdown of skin: Secondary | ICD-10-CM | POA: Diagnosis not present

## 2015-01-21 DIAGNOSIS — I890 Lymphedema, not elsewhere classified: Secondary | ICD-10-CM | POA: Diagnosis not present

## 2015-01-21 DIAGNOSIS — E11622 Type 2 diabetes mellitus with other skin ulcer: Secondary | ICD-10-CM | POA: Diagnosis not present

## 2015-01-21 DIAGNOSIS — I10 Essential (primary) hypertension: Secondary | ICD-10-CM | POA: Diagnosis not present

## 2015-01-25 DIAGNOSIS — I10 Essential (primary) hypertension: Secondary | ICD-10-CM | POA: Diagnosis not present

## 2015-01-25 DIAGNOSIS — L98491 Non-pressure chronic ulcer of skin of other sites limited to breakdown of skin: Secondary | ICD-10-CM | POA: Diagnosis not present

## 2015-01-25 DIAGNOSIS — L97112 Non-pressure chronic ulcer of right thigh with fat layer exposed: Secondary | ICD-10-CM | POA: Diagnosis not present

## 2015-01-25 DIAGNOSIS — L03115 Cellulitis of right lower limb: Secondary | ICD-10-CM | POA: Diagnosis not present

## 2015-01-25 DIAGNOSIS — I890 Lymphedema, not elsewhere classified: Secondary | ICD-10-CM | POA: Diagnosis not present

## 2015-01-25 DIAGNOSIS — E11622 Type 2 diabetes mellitus with other skin ulcer: Secondary | ICD-10-CM | POA: Diagnosis not present

## 2015-01-26 ENCOUNTER — Encounter: Payer: Self-pay | Admitting: *Deleted

## 2015-01-26 ENCOUNTER — Other Ambulatory Visit: Payer: Self-pay | Admitting: *Deleted

## 2015-01-26 NOTE — Patient Outreach (Signed)
[facility]) Care Management  01/26/2015  Taleya L Brumm July 24, 1950 [id number]  Ms. Zeller was referred to [facility] during her last hospital stay. I was able to reach her by phone 48 hours after her hospital discharge but have not been able to maintain contact with Ms. Reser since then on 4 separate attempts. I left a HIPPA compliant voice message for Ms. Mone at her home today providing our contact information should she be interested or in need of care management services. We are happy to engage with Ms. Tomassi at any time should we be able to assist with her health care needs.   Dr. Luan Pulling notified of case closure.    [facility]  [phone]

## 2015-01-27 DIAGNOSIS — I890 Lymphedema, not elsewhere classified: Secondary | ICD-10-CM | POA: Diagnosis not present

## 2015-01-27 DIAGNOSIS — L98491 Non-pressure chronic ulcer of skin of other sites limited to breakdown of skin: Secondary | ICD-10-CM | POA: Diagnosis not present

## 2015-01-27 DIAGNOSIS — E11622 Type 2 diabetes mellitus with other skin ulcer: Secondary | ICD-10-CM | POA: Diagnosis not present

## 2015-01-27 DIAGNOSIS — L97112 Non-pressure chronic ulcer of right thigh with fat layer exposed: Secondary | ICD-10-CM | POA: Diagnosis not present

## 2015-01-27 DIAGNOSIS — L03115 Cellulitis of right lower limb: Secondary | ICD-10-CM | POA: Diagnosis not present

## 2015-01-27 DIAGNOSIS — I10 Essential (primary) hypertension: Secondary | ICD-10-CM | POA: Diagnosis not present

## 2015-01-28 DIAGNOSIS — I10 Essential (primary) hypertension: Secondary | ICD-10-CM | POA: Diagnosis not present

## 2015-01-28 DIAGNOSIS — I890 Lymphedema, not elsewhere classified: Secondary | ICD-10-CM | POA: Diagnosis not present

## 2015-01-28 DIAGNOSIS — L03115 Cellulitis of right lower limb: Secondary | ICD-10-CM | POA: Diagnosis not present

## 2015-01-28 DIAGNOSIS — E11622 Type 2 diabetes mellitus with other skin ulcer: Secondary | ICD-10-CM | POA: Diagnosis not present

## 2015-01-28 DIAGNOSIS — L97112 Non-pressure chronic ulcer of right thigh with fat layer exposed: Secondary | ICD-10-CM | POA: Diagnosis not present

## 2015-01-28 DIAGNOSIS — L98491 Non-pressure chronic ulcer of skin of other sites limited to breakdown of skin: Secondary | ICD-10-CM | POA: Diagnosis not present

## 2015-02-01 DIAGNOSIS — E11622 Type 2 diabetes mellitus with other skin ulcer: Secondary | ICD-10-CM | POA: Diagnosis not present

## 2015-02-01 DIAGNOSIS — L03115 Cellulitis of right lower limb: Secondary | ICD-10-CM | POA: Diagnosis not present

## 2015-02-01 DIAGNOSIS — I890 Lymphedema, not elsewhere classified: Secondary | ICD-10-CM | POA: Diagnosis not present

## 2015-02-01 DIAGNOSIS — L97112 Non-pressure chronic ulcer of right thigh with fat layer exposed: Secondary | ICD-10-CM | POA: Diagnosis not present

## 2015-02-01 DIAGNOSIS — L98491 Non-pressure chronic ulcer of skin of other sites limited to breakdown of skin: Secondary | ICD-10-CM | POA: Diagnosis not present

## 2015-02-01 DIAGNOSIS — I10 Essential (primary) hypertension: Secondary | ICD-10-CM | POA: Diagnosis not present

## 2015-02-04 DIAGNOSIS — E11622 Type 2 diabetes mellitus with other skin ulcer: Secondary | ICD-10-CM | POA: Diagnosis not present

## 2015-02-04 DIAGNOSIS — L97112 Non-pressure chronic ulcer of right thigh with fat layer exposed: Secondary | ICD-10-CM | POA: Diagnosis not present

## 2015-02-04 DIAGNOSIS — L98491 Non-pressure chronic ulcer of skin of other sites limited to breakdown of skin: Secondary | ICD-10-CM | POA: Diagnosis not present

## 2015-02-04 DIAGNOSIS — I890 Lymphedema, not elsewhere classified: Secondary | ICD-10-CM | POA: Diagnosis not present

## 2015-02-04 DIAGNOSIS — I10 Essential (primary) hypertension: Secondary | ICD-10-CM | POA: Diagnosis not present

## 2015-02-04 DIAGNOSIS — L03115 Cellulitis of right lower limb: Secondary | ICD-10-CM | POA: Diagnosis not present

## 2015-02-08 DIAGNOSIS — I890 Lymphedema, not elsewhere classified: Secondary | ICD-10-CM | POA: Diagnosis not present

## 2015-02-08 DIAGNOSIS — I10 Essential (primary) hypertension: Secondary | ICD-10-CM | POA: Diagnosis not present

## 2015-02-08 DIAGNOSIS — L03115 Cellulitis of right lower limb: Secondary | ICD-10-CM | POA: Diagnosis not present

## 2015-02-08 DIAGNOSIS — E11622 Type 2 diabetes mellitus with other skin ulcer: Secondary | ICD-10-CM | POA: Diagnosis not present

## 2015-02-08 DIAGNOSIS — L98491 Non-pressure chronic ulcer of skin of other sites limited to breakdown of skin: Secondary | ICD-10-CM | POA: Diagnosis not present

## 2015-02-08 DIAGNOSIS — L97112 Non-pressure chronic ulcer of right thigh with fat layer exposed: Secondary | ICD-10-CM | POA: Diagnosis not present

## 2015-02-09 ENCOUNTER — Other Ambulatory Visit (HOSPITAL_COMMUNITY)
Admission: RE | Admit: 2015-02-09 | Discharge: 2015-02-09 | Disposition: A | Discharge status: Home / Self Care | Payer: Medicare Other | Source: Other Acute Inpatient Hospital | Attending: Pulmonary Disease | Admitting: Pulmonary Disease

## 2015-02-09 DIAGNOSIS — L97112 Non-pressure chronic ulcer of right thigh with fat layer exposed: Secondary | ICD-10-CM | POA: Diagnosis not present

## 2015-02-09 DIAGNOSIS — E119 Type 2 diabetes mellitus without complications: Secondary | ICD-10-CM | POA: Diagnosis not present

## 2015-02-09 DIAGNOSIS — E11622 Type 2 diabetes mellitus with other skin ulcer: Secondary | ICD-10-CM | POA: Insufficient documentation

## 2015-02-09 DIAGNOSIS — L03115 Cellulitis of right lower limb: Secondary | ICD-10-CM | POA: Diagnosis not present

## 2015-02-09 DIAGNOSIS — I10 Essential (primary) hypertension: Secondary | ICD-10-CM | POA: Diagnosis not present

## 2015-02-09 DIAGNOSIS — I890 Lymphedema, not elsewhere classified: Secondary | ICD-10-CM | POA: Diagnosis not present

## 2015-02-09 DIAGNOSIS — L98491 Non-pressure chronic ulcer of skin of other sites limited to breakdown of skin: Secondary | ICD-10-CM | POA: Diagnosis not present

## 2015-02-09 DIAGNOSIS — L97901 Non-pressure chronic ulcer of unspecified part of unspecified lower leg limited to breakdown of skin: Secondary | ICD-10-CM | POA: Diagnosis not present

## 2015-02-09 LAB — BASIC METABOLIC PANEL
Anion gap: 10 (ref 5–15)
BUN: 30 mg/dL — ABNORMAL HIGH (ref 6–20)
CHLORIDE: 106 mmol/L (ref 101–111)
CO2: 24 mmol/L (ref 22–32)
CREATININE: 1.86 mg/dL — AB (ref 0.44–1.00)
Calcium: 9.8 mg/dL (ref 8.9–10.3)
GFR calc non Af Amer: 28 mL/min — ABNORMAL LOW (ref >60)
GFR, EST AFRICAN AMERICAN: 32 mL/min — AB (ref >60)
GLUCOSE: 180 mg/dL — AB (ref 65–99)
Potassium: 5.8 mmol/L — ABNORMAL HIGH (ref 3.5–5.1)
Sodium: 140 mmol/L (ref 135–145)

## 2015-02-10 LAB — HEMOGLOBIN A1C
Hgb A1c MFr Bld: 7.1 % — ABNORMAL HIGH (ref 4.8–5.6)
Mean Plasma Glucose: 157 mg/dL

## 2015-02-15 DIAGNOSIS — I890 Lymphedema, not elsewhere classified: Secondary | ICD-10-CM | POA: Diagnosis not present

## 2015-02-15 DIAGNOSIS — L03115 Cellulitis of right lower limb: Secondary | ICD-10-CM | POA: Diagnosis not present

## 2015-02-15 DIAGNOSIS — E11622 Type 2 diabetes mellitus with other skin ulcer: Secondary | ICD-10-CM | POA: Diagnosis not present

## 2015-02-15 DIAGNOSIS — L98491 Non-pressure chronic ulcer of skin of other sites limited to breakdown of skin: Secondary | ICD-10-CM | POA: Diagnosis not present

## 2015-02-15 DIAGNOSIS — L97112 Non-pressure chronic ulcer of right thigh with fat layer exposed: Secondary | ICD-10-CM | POA: Diagnosis not present

## 2015-02-15 DIAGNOSIS — I10 Essential (primary) hypertension: Secondary | ICD-10-CM | POA: Diagnosis not present

## 2015-02-25 DIAGNOSIS — I890 Lymphedema, not elsewhere classified: Secondary | ICD-10-CM | POA: Diagnosis not present

## 2015-02-25 DIAGNOSIS — L03115 Cellulitis of right lower limb: Secondary | ICD-10-CM | POA: Diagnosis not present

## 2015-02-25 DIAGNOSIS — L98491 Non-pressure chronic ulcer of skin of other sites limited to breakdown of skin: Secondary | ICD-10-CM | POA: Diagnosis not present

## 2015-02-25 DIAGNOSIS — L97112 Non-pressure chronic ulcer of right thigh with fat layer exposed: Secondary | ICD-10-CM | POA: Diagnosis not present

## 2015-02-25 DIAGNOSIS — I10 Essential (primary) hypertension: Secondary | ICD-10-CM | POA: Diagnosis not present

## 2015-02-25 DIAGNOSIS — E11622 Type 2 diabetes mellitus with other skin ulcer: Secondary | ICD-10-CM | POA: Diagnosis not present

## 2015-02-28 DIAGNOSIS — M1990 Unspecified osteoarthritis, unspecified site: Secondary | ICD-10-CM | POA: Diagnosis not present

## 2015-02-28 DIAGNOSIS — L97112 Non-pressure chronic ulcer of right thigh with fat layer exposed: Secondary | ICD-10-CM | POA: Diagnosis not present

## 2015-02-28 DIAGNOSIS — L8942 Pressure ulcer of contiguous site of back, buttock and hip, stage 2: Secondary | ICD-10-CM | POA: Diagnosis not present

## 2015-02-28 DIAGNOSIS — Z8614 Personal history of Methicillin resistant Staphylococcus aureus infection: Secondary | ICD-10-CM | POA: Diagnosis not present

## 2015-02-28 DIAGNOSIS — E11622 Type 2 diabetes mellitus with other skin ulcer: Secondary | ICD-10-CM | POA: Diagnosis not present

## 2015-02-28 DIAGNOSIS — I890 Lymphedema, not elsewhere classified: Secondary | ICD-10-CM | POA: Diagnosis not present

## 2015-02-28 DIAGNOSIS — Z7901 Long term (current) use of anticoagulants: Secondary | ICD-10-CM | POA: Diagnosis not present

## 2015-02-28 DIAGNOSIS — S3992XD Unspecified injury of lower back, subsequent encounter: Secondary | ICD-10-CM | POA: Diagnosis not present

## 2015-02-28 DIAGNOSIS — Z86711 Personal history of pulmonary embolism: Secondary | ICD-10-CM | POA: Diagnosis not present

## 2015-02-28 DIAGNOSIS — Z452 Encounter for adjustment and management of vascular access device: Secondary | ICD-10-CM | POA: Diagnosis not present

## 2015-02-28 DIAGNOSIS — Z9181 History of falling: Secondary | ICD-10-CM | POA: Diagnosis not present

## 2015-02-28 DIAGNOSIS — L03115 Cellulitis of right lower limb: Secondary | ICD-10-CM | POA: Diagnosis not present

## 2015-02-28 DIAGNOSIS — Z86718 Personal history of other venous thrombosis and embolism: Secondary | ICD-10-CM | POA: Diagnosis not present

## 2015-02-28 DIAGNOSIS — I10 Essential (primary) hypertension: Secondary | ICD-10-CM | POA: Diagnosis not present

## 2015-02-28 DIAGNOSIS — Z792 Long term (current) use of antibiotics: Secondary | ICD-10-CM | POA: Diagnosis not present

## 2015-02-28 DIAGNOSIS — Z466 Encounter for fitting and adjustment of urinary device: Secondary | ICD-10-CM | POA: Diagnosis not present

## 2015-04-09 ENCOUNTER — Inpatient Hospital Stay (HOSPITAL_COMMUNITY)
Admission: EM | Admit: 2015-04-09 | Discharge: 2015-04-13 | DRG: 069 | Disposition: A | Discharge status: Skilled Nursing Facility | Payer: Medicare Other | Attending: Pulmonary Disease | Admitting: Pulmonary Disease

## 2015-04-09 ENCOUNTER — Observation Stay (HOSPITAL_COMMUNITY): Payer: Medicare Other

## 2015-04-09 ENCOUNTER — Emergency Department (HOSPITAL_COMMUNITY): Payer: Medicare Other

## 2015-04-09 ENCOUNTER — Encounter (HOSPITAL_COMMUNITY): Payer: Self-pay | Admitting: *Deleted

## 2015-04-09 DIAGNOSIS — Z7901 Long term (current) use of anticoagulants: Secondary | ICD-10-CM

## 2015-04-09 DIAGNOSIS — Z8614 Personal history of Methicillin resistant Staphylococcus aureus infection: Secondary | ICD-10-CM

## 2015-04-09 DIAGNOSIS — S71102A Unspecified open wound, left thigh, initial encounter: Secondary | ICD-10-CM | POA: Diagnosis not present

## 2015-04-09 DIAGNOSIS — R404 Transient alteration of awareness: Secondary | ICD-10-CM | POA: Diagnosis not present

## 2015-04-09 DIAGNOSIS — L02419 Cutaneous abscess of limb, unspecified: Secondary | ICD-10-CM | POA: Diagnosis present

## 2015-04-09 DIAGNOSIS — F329 Major depressive disorder, single episode, unspecified: Secondary | ICD-10-CM | POA: Diagnosis present

## 2015-04-09 DIAGNOSIS — G459 Transient cerebral ischemic attack, unspecified: Secondary | ICD-10-CM | POA: Diagnosis not present

## 2015-04-09 DIAGNOSIS — E119 Type 2 diabetes mellitus without complications: Secondary | ICD-10-CM | POA: Diagnosis present

## 2015-04-09 DIAGNOSIS — B957 Other staphylococcus as the cause of diseases classified elsewhere: Secondary | ICD-10-CM | POA: Diagnosis present

## 2015-04-09 DIAGNOSIS — R4701 Aphasia: Secondary | ICD-10-CM | POA: Diagnosis present

## 2015-04-09 DIAGNOSIS — J9811 Atelectasis: Secondary | ICD-10-CM | POA: Diagnosis not present

## 2015-04-09 DIAGNOSIS — L03116 Cellulitis of left lower limb: Secondary | ICD-10-CM | POA: Diagnosis not present

## 2015-04-09 DIAGNOSIS — L02416 Cutaneous abscess of left lower limb: Secondary | ICD-10-CM | POA: Diagnosis present

## 2015-04-09 DIAGNOSIS — R491 Aphonia: Secondary | ICD-10-CM | POA: Diagnosis present

## 2015-04-09 DIAGNOSIS — I6523 Occlusion and stenosis of bilateral carotid arteries: Secondary | ICD-10-CM | POA: Diagnosis not present

## 2015-04-09 DIAGNOSIS — Z8249 Family history of ischemic heart disease and other diseases of the circulatory system: Secondary | ICD-10-CM

## 2015-04-09 DIAGNOSIS — Z6841 Body Mass Index (BMI) 40.0 and over, adult: Secondary | ICD-10-CM | POA: Diagnosis not present

## 2015-04-09 DIAGNOSIS — Z7401 Bed confinement status: Secondary | ICD-10-CM

## 2015-04-09 DIAGNOSIS — L03119 Cellulitis of unspecified part of limb: Secondary | ICD-10-CM

## 2015-04-09 DIAGNOSIS — I749 Embolism and thrombosis of unspecified artery: Secondary | ICD-10-CM

## 2015-04-09 DIAGNOSIS — R4182 Altered mental status, unspecified: Secondary | ICD-10-CM | POA: Diagnosis not present

## 2015-04-09 DIAGNOSIS — Z86711 Personal history of pulmonary embolism: Secondary | ICD-10-CM

## 2015-04-09 DIAGNOSIS — Z833 Family history of diabetes mellitus: Secondary | ICD-10-CM

## 2015-04-09 DIAGNOSIS — R402431 Glasgow coma scale score 3-8, in the field [EMT or ambulance]: Secondary | ICD-10-CM | POA: Diagnosis not present

## 2015-04-09 DIAGNOSIS — B9620 Unspecified Escherichia coli [E. coli] as the cause of diseases classified elsewhere: Secondary | ICD-10-CM | POA: Diagnosis present

## 2015-04-09 DIAGNOSIS — I10 Essential (primary) hypertension: Secondary | ICD-10-CM | POA: Diagnosis present

## 2015-04-09 DIAGNOSIS — Z7984 Long term (current) use of oral hypoglycemic drugs: Secondary | ICD-10-CM

## 2015-04-09 DIAGNOSIS — F32A Depression, unspecified: Secondary | ICD-10-CM | POA: Diagnosis present

## 2015-04-09 LAB — COMPREHENSIVE METABOLIC PANEL
ALK PHOS: 53 U/L (ref 38–126)
ALT: 11 U/L — AB (ref 14–54)
AST: 13 U/L — AB (ref 15–41)
Albumin: 3.2 g/dL — ABNORMAL LOW (ref 3.5–5.0)
Anion gap: 9 (ref 5–15)
BILIRUBIN TOTAL: 0.4 mg/dL (ref 0.3–1.2)
BUN: 23 mg/dL — AB (ref 6–20)
CALCIUM: 9.1 mg/dL (ref 8.9–10.3)
CHLORIDE: 106 mmol/L (ref 101–111)
CO2: 25 mmol/L (ref 22–32)
CREATININE: 1.39 mg/dL — AB (ref 0.44–1.00)
GFR, EST AFRICAN AMERICAN: 45 mL/min — AB (ref >60)
GFR, EST NON AFRICAN AMERICAN: 39 mL/min — AB (ref >60)
Glucose, Bld: 140 mg/dL — ABNORMAL HIGH (ref 65–99)
Potassium: 4.8 mmol/L (ref 3.5–5.1)
Sodium: 140 mmol/L (ref 135–145)
TOTAL PROTEIN: 7.1 g/dL (ref 6.5–8.1)

## 2015-04-09 LAB — CBC WITH DIFFERENTIAL/PLATELET
BASOS PCT: 0 %
Basophils Absolute: 0 10*3/uL (ref 0.0–0.1)
EOS ABS: 0.1 10*3/uL (ref 0.0–0.7)
EOS PCT: 2 %
HCT: 35.2 % — ABNORMAL LOW (ref 36.0–46.0)
HEMOGLOBIN: 10.7 g/dL — AB (ref 12.0–15.0)
LYMPHS ABS: 2.3 10*3/uL (ref 0.7–4.0)
Lymphocytes Relative: 33 %
MCH: 26 pg (ref 26.0–34.0)
MCHC: 30.4 g/dL (ref 30.0–36.0)
MCV: 85.4 fL (ref 78.0–100.0)
Monocytes Absolute: 0.4 10*3/uL (ref 0.1–1.0)
Monocytes Relative: 6 %
NEUTROS PCT: 59 %
Neutro Abs: 4 10*3/uL (ref 1.7–7.7)
PLATELETS: 266 10*3/uL (ref 150–400)
RBC: 4.12 MIL/uL (ref 3.87–5.11)
RDW: 15.3 % (ref 11.5–15.5)
WBC: 6.8 10*3/uL (ref 4.0–10.5)

## 2015-04-09 LAB — URINALYSIS, ROUTINE W REFLEX MICROSCOPIC
GLUCOSE, UA: NEGATIVE mg/dL
Hgb urine dipstick: NEGATIVE
Ketones, ur: 15 mg/dL — AB
LEUKOCYTES UA: NEGATIVE
Nitrite: NEGATIVE
PROTEIN: NEGATIVE mg/dL
SPECIFIC GRAVITY, URINE: 1.015 (ref 1.005–1.030)
pH: 5.5 (ref 5.0–8.0)

## 2015-04-09 LAB — TSH: TSH: 0.794 u[IU]/mL (ref 0.350–4.500)

## 2015-04-09 LAB — CBG MONITORING, ED: GLUCOSE-CAPILLARY: 125 mg/dL — AB (ref 65–99)

## 2015-04-09 LAB — I-STAT CG4 LACTIC ACID, ED: Lactic Acid, Venous: 1.57 mmol/L (ref 0.5–2.0)

## 2015-04-09 MED ORDER — METFORMIN HCL 500 MG PO TABS
500.0000 mg | ORAL_TABLET | Freq: Two times a day (BID) | ORAL | Status: DC
  Administered 2015-04-09 – 2015-04-13 (×8): 500 mg via ORAL
  Filled 2015-04-09 (×8): qty 1

## 2015-04-09 MED ORDER — VERAPAMIL HCL ER 240 MG PO TBCR
240.0000 mg | EXTENDED_RELEASE_TABLET | Freq: Every day | ORAL | Status: DC
  Administered 2015-04-09 – 2015-04-13 (×5): 240 mg via ORAL
  Filled 2015-04-09 (×5): qty 1

## 2015-04-09 MED ORDER — ADULT MULTIVITAMIN W/MINERALS CH
1.0000 | ORAL_TABLET | Freq: Every day | ORAL | Status: DC
  Administered 2015-04-09 – 2015-04-13 (×5): 1 via ORAL
  Filled 2015-04-09 (×5): qty 1

## 2015-04-09 MED ORDER — CELECOXIB 100 MG PO CAPS
200.0000 mg | ORAL_CAPSULE | Freq: Every day | ORAL | Status: DC
  Administered 2015-04-09 – 2015-04-13 (×5): 200 mg via ORAL
  Filled 2015-04-09 (×5): qty 2

## 2015-04-09 MED ORDER — SODIUM CHLORIDE 0.9 % IV BOLUS (SEPSIS): 500.0000 mL | INTRAVENOUS | Status: DC

## 2015-04-09 MED ORDER — SODIUM CHLORIDE 0.9 % IV SOLN
1000.0000 mL | INTRAVENOUS | Status: DC
  Administered 2015-04-09 – 2015-04-11 (×3): 1000 mL via INTRAVENOUS

## 2015-04-09 MED ORDER — DEXTROSE 5 % IV SOLN
2.0000 g | Freq: Once | INTRAVENOUS | Status: AC
  Administered 2015-04-09: 2 g via INTRAVENOUS
  Filled 2015-04-09: qty 2

## 2015-04-09 MED ORDER — GLIPIZIDE 5 MG PO TABS
5.0000 mg | ORAL_TABLET | Freq: Two times a day (BID) | ORAL | Status: DC
  Administered 2015-04-09 – 2015-04-13 (×8): 5 mg via ORAL
  Filled 2015-04-09 (×8): qty 1

## 2015-04-09 MED ORDER — GLIPIZIDE-METFORMIN HCL 5-500 MG PO TABS: 1.0000 | ORAL_TABLET | Freq: Two times a day (BID) | ORAL | Status: DC

## 2015-04-09 MED ORDER — RIVAROXABAN 20 MG PO TABS
20.0000 mg | ORAL_TABLET | Freq: Every day | ORAL | Status: DC
  Administered 2015-04-09 – 2015-04-12 (×4): 20 mg via ORAL
  Filled 2015-04-09 (×4): qty 1

## 2015-04-09 MED ORDER — TRANDOLAPRIL 2 MG PO TABS
4.0000 mg | ORAL_TABLET | Freq: Every day | ORAL | Status: DC
  Administered 2015-04-09 – 2015-04-12 (×4): 4 mg via ORAL
  Filled 2015-04-09 (×4): qty 2

## 2015-04-09 MED ORDER — DEXTROSE 5 % IV SOLN
2.0000 g | INTRAVENOUS | Status: DC
  Administered 2015-04-10: 2 g via INTRAVENOUS
  Filled 2015-04-09 (×3): qty 2

## 2015-04-09 MED ORDER — ONDANSETRON HCL 4 MG/2ML IJ SOLN: 4.0000 mg | Freq: Three times a day (TID) | INTRAMUSCULAR | Status: AC | PRN

## 2015-04-09 MED ORDER — DUODERM CGF DRESSING EX MISC: Freq: Every day | CUTANEOUS | Status: DC

## 2015-04-09 MED ORDER — SODIUM CHLORIDE 0.9 % IV BOLUS (SEPSIS): 1000.0000 mL | INTRAVENOUS | Status: DC

## 2015-04-09 NOTE — ED Provider Notes (Signed)
CSN: [id number]     Arrival date & time 04/09/15  1128 History  By signing my name below, I, Altamease Oiler, attest that this documentation has been prepared under the direction and in the presence of Noemi Chapel, MD. Electronically Signed: Altamease Oiler, ED Scribe. 04/09/2015. 11:59 AM  Chief Complaint  Patient presents with  . Altered Mental Status   Level V caveat due to AMS   The history is provided by a relative. No language interpreter was used.   [name] is a 65 y.o. female with history of HTN and PE who presents to the Emergency Department with her son complaining of AMS with onset this morning. The patient's son expresses that the pt is typically conversant but has been less responsive. This morning near 9 AM,  when he tried to give her breakfast the pt expressed that she could not swallow the food and that she had a bad taste in her mouth. Near 9:30 PM the pt was noted to be laid back on her bed and was not answering questions.  The patient's husband, who typically cares for her, has been hospitalized for 1 week and her son has been trying to take care of her. This morning her blood sugar was 590 at home and 178 for EMS.    Past Medical History  Diagnosis Date  . Essential hypertension, benign   . Type 2 diabetes mellitus ([place])   . Skin ulcer of thigh ([place])   . Pulmonary emboli ([place]) 2009  . History of MRSA infection   . History of panniculitis   . Cellulitis lower extremities    4 day hospitalization 12/25/14-12/29/14   Past Surgical History  Procedure Laterality Date  . Partial hysterectomy    . Right thigh ulcer debridement  2008    Two occasions - wound VAC   Family History  Problem Relation Age of Onset  . Cancer - Colon Father   . Heart disease Mother   . Diabetes Mellitus II Mother    Social History  Substance Use Topics  . Smoking status: Never Smoker   . Smokeless tobacco: None  . Alcohol Use: No   OB History    No data available     Review  of Systems  Unable to perform ROS: Mental status change   Allergies  Vancomycin  Home Medications   Prior to Admission medications   Medication Sig Start Date End Date Taking? Authorizing Provider  CELEBREX 200 MG capsule Take 200 mg by mouth daily.  10/13/11  Yes Historical Provider, MD  glipiZIDE-metformin (METAGLIP) 5-500 MG tablet Take 1 tablet by mouth 2 (two) times daily before a meal.   Yes Historical Provider, MD  verapamil (CALAN-SR) 240 MG CR tablet Take 240 mg by mouth daily.  10/24/11  Yes Historical Provider, MD  XARELTO 20 MG TABS Take 20 mg by mouth daily with supper.  11/29/11  Yes Historical Provider, MD  oxyCODONE-acetaminophen (PERCOCET/ROXICET) 5-325 MG per tablet Take 1 tablet by mouth 4 (four) times daily.  11/23/11   Historical Provider, MD  trandolapril (MAVIK) 4 MG tablet Take 4 mg by mouth daily.  11/24/11   Historical Provider, MD  vancomycin 1,000 mg in sodium chloride 0.9 % 250 mL Inject 2,000 mg into the vein daily.    Sinda Du, MD   BP 131/91 mmHg  Pulse 93  Temp(Src) 98.2 F (36.8 C) (Core (Comment))  Resp 16  SpO2 99% Physical Exam  Constitutional: She appears well-developed and well-nourished.  No distress.  AMS, confused, moans but non-verbal  HENT:  Head: Normocephalic and atraumatic.  Mouth/Throat: Oropharynx is clear and moist. No oropharyngeal exudate.  Eyes: Conjunctivae and EOM are normal. Pupils are equal, round, and reactive to light. Right eye exhibits no discharge. Left eye exhibits no discharge. No scleral icterus.  Neck: Normal range of motion. Neck supple. No JVD present. No thyromegaly present.  Cardiovascular: Regular rhythm, normal heart sounds and intact distal pulses.  Exam reveals no gallop and no friction rub.   No murmur heard. Mild tachycardia  Pulmonary/Chest: Effort normal and breath sounds normal. No respiratory distress. She has no wheezes. She has no rales.  CTAB No increased WOB  Abdominal: Soft. Bowel sounds are  normal. She exhibits no distension and no mass. There is no tenderness.  Obese but soft and nontender  Musculoskeletal: Normal range of motion. She exhibits no edema or tenderness.  Severe symmetrical lymphedema   Lymphadenopathy:    She has no cervical adenopathy.  Neurological: She is alert. Coordination normal.  Opens eyes to voice Follows commands with arm  Severe generalized weakness Speech limited to groaning Moves head from side to side No obvious facial droop Pupils normal   Skin: Skin is warm and dry. No rash noted. No erythema.  Nursing note and vitals reviewed.   ED Course  Procedures (including critical care time)  COORDINATION OF CARE: 11:53 AM Discussed treatment plan  with the patient's son at bedside and he  agreed to plan.  Labs Review Labs Reviewed  COMPREHENSIVE METABOLIC PANEL - Abnormal; Notable for the following:    Glucose, Bld 140 (*)    BUN 23 (*)    Creatinine, Ser 1.39 (*)    Albumin 3.2 (*)    AST 13 (*)    ALT 11 (*)    GFR calc non Af Amer 39 (*)    GFR calc Af Amer 45 (*)    All other components within normal limits  CBC WITH DIFFERENTIAL/PLATELET - Abnormal; Notable for the following:    Hemoglobin 10.7 (*)    HCT 35.2 (*)    All other components within normal limits  URINALYSIS, ROUTINE W REFLEX MICROSCOPIC (NOT AT [facility]) - Abnormal; Notable for the following:    Bilirubin Urine MODERATE (*)    Ketones, ur 15 (*)    All other components within normal limits  CBG MONITORING, ED - Abnormal; Notable for the following:    Glucose-Capillary 125 (*)    All other components within normal limits  CULTURE, BLOOD (ROUTINE X 2)  CULTURE, BLOOD (ROUTINE X 2)  URINE CULTURE  I-STAT CG4 LACTIC ACID, ED  I-STAT CG4 LACTIC ACID, ED    Imaging Review Ct Head Wo Contrast  04/09/2015  CLINICAL DATA:  65 year old female with altered mental status EXAM: CT HEAD WITHOUT CONTRAST TECHNIQUE: Contiguous axial images were obtained from the base of the  skull through the vertex without intravenous contrast. COMPARISON:  None. FINDINGS: Negative for acute intracranial hemorrhage, acute infarction, mass, mass effect, hydrocephalus or midline shift. Gray-white differentiation is preserved throughout. Focal circumscribed hypoattenuation in the left pons likely represents a remote lacunar infarct. Mild periventricular white matter hypoattenuation consistent with chronic microvascular ischemic white matter disease. No focal scalp or calvarial abnormality. Globes and orbits are intact and symmetric. Normal aeration of the mastoid air cells and maxillary sinuses. Atherosclerotic calcification in the bilateral cavernous carotid arteries. IMPRESSION: 1. No acute intracranial abnormality. 2. 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 3. 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 4. Intracranial atherosclerosis. Electronically Signed   By: Jacqulynn Cadet M.D.   On: 04/09/2015 13:50   Dg Chest Port 1 View  04/09/2015  CLINICAL DATA:  65 year old female with altered mental status EXAM: PORTABLE CHEST 1 VIEW COMPARISON:  Prior chest x-ray 12/25/2014 FINDINGS: Extremely low inspiratory volumes with mild bibasilar atelectasis. Low inspiratory volumes results in crowding of the pulmonary vasculature. No definite pulmonary edema. Cardiac and mediastinal contours remain within normal limits. Atherosclerotic calcifications in the transverse aorta. No focal airspace consolidation or pneumothorax. No acute osseous abnormality. IMPRESSION: Very low inspiratory volumes with mild bibasilar atelectasis. Electronically Signed   By: Jacqulynn Cadet M.D.   On: 04/09/2015 12:40   I have personally reviewed and evaluated these images and lab results as part of my medical decision-making.   EKG Interpretation   Date/Time:  Friday April 09 2015 11:35:36 EST Ventricular Rate:  96 PR Interval:  144 QRS Duration: 96 QT Interval:  333 QTC Calculation: 421 R  Axis:   -19 Text Interpretation:  Sinus rhythm Probable left atrial enlargement  Borderline left axis deviation since last tracing no significant change  Confirmed by Markeise Mathews  MD, Balraj Brayfield ([id number]) on 04/09/2015 12:29:42 PM      MDM   Final diagnoses:  Transient cerebral ischemia, unspecified transient cerebral ischemia type  Open wound of thigh, left, initial encounter    The patient does have significant altered mental status compared to baseline. According to the son usually she is totally able to talk, converse and do some assistance with her hands, she was even on the phone this morning around 9:00, by 9:30 she had significant altered mental status, at this point she is able to open her eyes, move her head from side to side, she is able to lift both arms though they're both diffusely weak. I do not see a focal lateralizing defect. She has almost complete lack of use of her lower lower extremities. Her Foley catheter appears hazy, lots of sediment, this will need to be replaced. At this time I do not think that the patient is a code stroke, her blood sugar was measured pre-hospital at close to 600 however on arrival here it is 125. She has some renal insufficiency but nothing severe, lactic acid is normal and EKG is overall unchanged. We'll obtain CT scan, lab workup, the patient appears to be more alert but is still not conversing.  The pt has normal VS other than slight hypertension She has now returned to baseline and has no language defecits. She has no fever Labs reviewsed and appear close to baseline On repeat exam she does have a wound on the proximal L inner thigh where her foley catheter has rubbed it raw. And has an open wound which is tender.   She is however on repeat exam able to lift her legs slightly - c/w weakness of arms. D/w Dr. Marin Comment who will admit.   I personally performed the services described in this documentation, which was scribed in my presence. The recorded information  has been reviewed and is accurate.      Noemi Chapel, MD 04/09/15 1500

## 2015-04-09 NOTE — ED Notes (Signed)
Pt back from US

## 2015-04-09 NOTE — ED Notes (Signed)
TEMP FOLEY PLACED DUE TO SEPSIS PROTOCOL. Foley was in place on arrival and removed and replaced with the temp foley due to possible infection. Current foley was in for 1 mo. Per family. Lots of sediment in bag.

## 2015-04-09 NOTE — Progress Notes (Signed)
[facility]  Patient Status: Active (receiving services up to time of hospitalization)  [facility] is providing the following services: RN and MSW  If patient discharges after hours, please call [phone].   [place] 04/09/2015, 4:10 PM

## 2015-04-09 NOTE — ED Notes (Signed)
Foley cath inserted by Anderson Malta, RN assisted by NT x 2 and RN x 1. Decub noted to right buttock and open wound to right inner thigh which have been treated weekly by Home Health. Son states areas are improving.

## 2015-04-09 NOTE — ED Notes (Addendum)
TEMP FOLEY placed due to possible sepsis. Foley was in place on arrival. Lots of sediment was present. Verbal order to replace foley on arrival.

## 2015-04-09 NOTE — ED Notes (Signed)
Report attempted 

## 2015-04-09 NOTE — ED Notes (Signed)
EMS states pt normally is alert and talking. EMS states family did not give good hx of recent illness. AMS possibly began 45 min PTA. Pt will not follow most instructions such as holding arms up or attempting to smile for neuro assessment. Pt shakes head no when asked to perform tasks such as "open eyes and look this way" . Foley in place on arrival with large amounts of sediment to the bag. Pt is from home. CBG en route was 178.

## 2015-04-09 NOTE — Progress Notes (Signed)
Pharmacy Antibiotic Note  [name] is a 65 y.o. female admitted on 04/09/2015 with UTI.  Pharmacy has been consulted for Cefepime dosing.  Plan: Cefepime 2 GM IV given in ED. Start Cefepime 2 GM IV every 24 hours. Monitor renal function, adjust dose if necessary. F/U cultures     Temp (24hrs), Avg:98.4 F (36.9 C), Min:98.4 F (36.9 C), Max:98.4 F (36.9 C)   Recent Labs Lab 04/09/15 1130 04/09/15 1158  WBC 6.8  --   CREATININE 1.39*  --   LATICACIDVEN  --  1.57    CrCl cannot be calculated (Unknown ideal weight.).    Allergies  Allergen Reactions  . Vancomycin Rash    "big red bumps, then they turned white, then I shedded like a snake."     Antimicrobials this admission: Cefpime  >>  2/17 >   Dose adjustments this admission: None at this time  Microbiology results:  BCx:  pending  UCx:  pending   Sputum:    MRSA PCR:   Thank you for allowing pharmacy to be a part of this patient's care.  Chriss Czar 04/09/2015 1:17 PM

## 2015-04-09 NOTE — ED Notes (Signed)
IV to right ac by EMS infiltrated ~10cc. IV removed and 20G initiated to right posterior wrist and 22G to right hand. Pt is now talking coherently. No slurred speech noted. Pt able to carry full conversation without difficulty.

## 2015-04-09 NOTE — H&P (Signed)
[facility] History and Physical  Myella Okun Lahmann LR:[id number] DOB: [birth year]    PCP:   Alonza Bogus, MD   Chief Complaint:  Unable to speak with no other neurological deficit.   HPI: [name] is an 65 y.o. female with hx of pulmonary emboli, currently on Xarelto with good compliance, hx of panniculitis, longstanding severe lower extremity lymphadema, hx of HTN, DM, recurrent cellulitis, brought to the hospital as she found herself unable to speak for about 2 hours.  She said she was awake, alert, but unable to make any sound.  She has been stressed, as her sister died just 2 days ago, and her husband is hospitalized.  There was no slurred speech, facial droop, focal weakness, visual changes, or HA.  Once in the ER, she was able to speak normally again.  Evalaution in the ER included a head with no acute pathology.  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  Serology showed Cr of 1.39 with no leukocytosis.  EKG showed normal sinus rhythm.  She also was found to have an ulcer on her proximal thigh with cellulitis.  Hospitalist was asked to admit her for TIA and cellulitis.   Rewiew of Systems:  Constitutional: Negative for malaise, fever and chills. No significant weight loss or weight gain Eyes: Negative for eye pain, redness and discharge, diplopia, visual changes, or flashes of light. ENMT: Negative for ear pain, hoarseness, nasal congestion, sinus pressure and sore throat. No headaches; tinnitus, drooling, or problem swallowing. Cardiovascular: Negative for chest pain, palpitations, diaphoresis, dyspnea and peripheral edema. ; No orthopnea, PND Respiratory: Negative for cough, hemoptysis, wheezing and stridor. No pleuritic chestpain. Gastrointestinal: Negative for nausea, vomiting, diarrhea, constipation, abdominal pain, melena, blood in stool, hematemesis, jaundice and rectal bleeding.    Genitourinary: Negative for frequency, dysuria,  incontinence,flank pain and hematuria; Musculoskeletal: Negative for back pain and neck pain. Negative for swelling and trauma.;  Skin: . Negative for pruritus, rash, abrasions, bruising and skin lesion.; ulcerations Neuro: Negative for headache, lightheadedness and neck stiffness. Negative for weakness, altered level of consciousness , altered mental status, extremity weakness, burning feet, involuntary movement, seizure and syncope.  Psych: negative for anxiety, depression, insomnia, tearfulness, panic attacks, hallucinations, paranoia, suicidal or homicidal ideation    Past Medical History  Diagnosis Date  . Essential hypertension, benign   . Type 2 diabetes mellitus ([place])   . Skin ulcer of thigh ([place])   . Pulmonary emboli ([place]) 2009  . History of MRSA infection   . History of panniculitis   . Cellulitis lower extremities    4 day hospitalization 12/25/14-12/29/14    Past Surgical History  Procedure Laterality Date  . Partial hysterectomy    . Right thigh ulcer debridement  2008    Two occasions - wound VAC    Medications:  HOME MEDS: Prior to Admission medications   Medication Sig Start Date End Date Taking? Authorizing Provider  CELEBREX 200 MG capsule Take 200 mg by mouth daily.  10/13/11  Yes Historical Provider, MD  Control Gel Formula Dressing (DUODERM CGF DRESSING EX) Apply 1 patch topically daily. APPLIED TO AFFECTED BUTTOCKS AREA   Yes Historical Provider, MD  glipiZIDE-metformin (METAGLIP) 5-500 MG tablet Take 1 tablet by mouth 2 (two) times daily before a meal.   Yes Historical Provider, MD  Multiple Vitamin (MULTIVITAMIN WITH MINERALS) TABS tablet Take 1 tablet by mouth daily. *CENTRUM SILVER   Yes Historical Provider, MD  oxyCODONE-acetaminophen (PERCOCET/ROXICET) 5-325 MG  per tablet Take 1 tablet by mouth 4 (four) times daily.  11/23/11  Yes Historical Provider, MD  trandolapril (MAVIK) 4 MG tablet Take 4 mg by mouth daily.  11/24/11  Yes Historical Provider, MD   verapamil (CALAN-SR) 240 MG CR tablet Take 240 mg by mouth daily.  10/24/11  Yes Historical Provider, MD  XARELTO 20 MG TABS Take 20 mg by mouth daily with supper.  11/29/11  Yes Historical Provider, MD     Allergies:  Allergies  Allergen Reactions  . Vancomycin Hives and Rash    "big red bumps, then they turned white, then I shedded like a snake."     Social History:   reports that she has never smoked. She does not have any smokeless tobacco history on file. She reports that she does not drink alcohol or use illicit drugs.  Family History: Family History  Problem Relation Age of Onset  . Cancer - Colon Father   . Heart disease Mother   . Diabetes Mellitus II Mother      Physical Exam: Filed Vitals:   04/09/15 1230 04/09/15 1305 04/09/15 1330 04/09/15 1459  BP: 158/97 131/56 131/91 143/64  Pulse:  88 93 88  Temp:  98.4 F (36.9 C) 98.2 F (36.8 C) 98.6 F (37 C)  TempSrc:  Core (Comment)  Core (Comment)  Resp: 17 14 16 17   SpO2:   99% 100%   Blood pressure 143/64, pulse 88, temperature 98.6 F (37 C), temperature source Core (Comment), resp. rate 17, SpO2 100 %.  GEN:  Pleasant  patient lying in the stretcher in no acute distress; cooperative with exam. PSYCH:  alert and oriented x4; does not appear anxious or depressed; affect is appropriate. HEENT: Mucous membranes pink and anicteric; PERRLA; EOM intact; no cervical lymphadenopathy nor thyromegaly or carotid bruit; no JVD; There were no stridor. Neck is very supple. Breasts:: Not examined CHEST WALL: No tenderness CHEST: Normal respiration, clear to auscultation bilaterally.  HEART: Regular rate and rhythm.  There are no murmur, rub, or gallops.   BACK: No kyphosis or scoliosis; no CVA tenderness ABDOMEN: soft and non-tender; no masses, no organomegaly, normal abdominal bowel sounds; no pannus; no intertriginous candida. There is no rebound and no distention. Rectal Exam: Not done EXTREMITIES: No bone or joint  deformity; age-appropriate arthropathy of the hands and knees; no edema; no ulcerations.  There is no calf tenderness. Genitalia: not examined PULSES: 2+ and symmetric SKIN: Normal hydration no rash or ulceration CNS: Cranial nerves 2-12 grossly intact no focal lateralizing neurologic deficit.  Speech is fluent; uvula elevated with phonation, facial symmetry and tongue midline. DTR are normal bilaterally, cerebella exam is intact, barbinski is negative and strength is difficult to evaluate as she has lymphedema, and unable to raise her leg.     Labs on Admission:  Basic Metabolic Panel:  Recent Labs Lab 04/09/15 1130  NA 140  K 4.8  CL 106  CO2 25  GLUCOSE 140*  BUN 23*  CREATININE 1.39*  CALCIUM 9.1   Liver Function Tests:  Recent Labs Lab 04/09/15 1130  AST 13*  ALT 11*  ALKPHOS 53  BILITOT 0.4  PROT 7.1  ALBUMIN 3.2*   No results for input(s): LIPASE, AMYLASE in the last 168 hours. No results for input(s): AMMONIA in the last 168 hours. CBC:  Recent Labs Lab 04/09/15 1130  WBC 6.8  NEUTROABS 4.0  HGB 10.7*  HCT 35.2*  MCV 85.4  PLT 266   CBG:  Recent Labs Lab 04/09/15 1156  GLUCAP 125*     Radiological Exams on Admission: Ct Head Wo Contrast  04/09/2015  CLINICAL DATA:  65 year old female with altered mental status EXAM: CT HEAD WITHOUT CONTRAST TECHNIQUE: Contiguous axial images were obtained from the base of the skull through the vertex without intravenous contrast. COMPARISON:  None. FINDINGS: Negative for acute intracranial hemorrhage, acute infarction, mass, mass effect, hydrocephalus or midline shift. Gray-white differentiation is preserved throughout. Focal circumscribed hypoattenuation in the left pons likely represents a remote lacunar infarct. Mild periventricular white matter hypoattenuation consistent with chronic microvascular ischemic white matter disease. No focal scalp or calvarial abnormality. Globes and orbits are intact and symmetric.  Normal aeration of the mastoid air cells and maxillary sinuses. Atherosclerotic calcification in the bilateral cavernous carotid arteries. IMPRESSION: 1. No acute intracranial abnormality. 2. 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 3. 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 4. Intracranial atherosclerosis. Electronically Signed   By: Jacqulynn Cadet M.D.   On: 04/09/2015 13:50   Dg Chest Port 1 View  04/09/2015  CLINICAL DATA:  65 year old female with altered mental status EXAM: PORTABLE CHEST 1 VIEW COMPARISON:  Prior chest x-ray 12/25/2014 FINDINGS: Extremely low inspiratory volumes with mild bibasilar atelectasis. Low inspiratory volumes results in crowding of the pulmonary vasculature. No definite pulmonary edema. Cardiac and mediastinal contours remain within normal limits. Atherosclerotic calcifications in the transverse aorta. No focal airspace consolidation or pneumothorax. No acute osseous abnormality. IMPRESSION: Very low inspiratory volumes with mild bibasilar atelectasis. Electronically Signed   By: Jacqulynn Cadet M.D.   On: 04/09/2015 12:40    EKG: Independently reviewed.    Assessment/Plan Present on Admission:  . TIA (transient ischemic attack) . Aphasia . Cellulitis and abscess of leg . Morbid obesity ([place]) . Conversion aphonia  PLAN:  Conversion Aphonia:  I suspect she has conversion aphonia, rather than TIA, given significant stress, and no other neurological to accompany the only symptom of not able to speak.  Her speech is normal now, and it came back impromtu.  Will put her on telemetry, and obtain US of the carotid and ECHO, just to be sure.  She is already on Xarelto, so even if she has TIA, which I doubt, would result in limited Tx modality.  She agreed with this.  Cellulitis:  Will continue with IV Cefepime.     Obesity:  Noted.  Placement:  She would like me to consult LCSW for placement, as she feel she can no longer live at home.     Other plans as per orders. Code Status: FULL Haskel Khan, MD. FACP [facility] Pager [phone] 7pm to 7am.  04/09/2015, 4:00 PM

## 2015-04-10 DIAGNOSIS — M6281 Muscle weakness (generalized): Secondary | ICD-10-CM | POA: Diagnosis not present

## 2015-04-10 DIAGNOSIS — I10 Essential (primary) hypertension: Secondary | ICD-10-CM | POA: Diagnosis present

## 2015-04-10 DIAGNOSIS — L03119 Cellulitis of unspecified part of limb: Secondary | ICD-10-CM | POA: Diagnosis not present

## 2015-04-10 DIAGNOSIS — R279 Unspecified lack of coordination: Secondary | ICD-10-CM | POA: Diagnosis not present

## 2015-04-10 DIAGNOSIS — E11622 Type 2 diabetes mellitus with other skin ulcer: Secondary | ICD-10-CM | POA: Diagnosis not present

## 2015-04-10 DIAGNOSIS — Z8614 Personal history of Methicillin resistant Staphylococcus aureus infection: Secondary | ICD-10-CM | POA: Diagnosis not present

## 2015-04-10 DIAGNOSIS — L03115 Cellulitis of right lower limb: Secondary | ICD-10-CM | POA: Diagnosis not present

## 2015-04-10 DIAGNOSIS — F329 Major depressive disorder, single episode, unspecified: Secondary | ICD-10-CM | POA: Diagnosis present

## 2015-04-10 DIAGNOSIS — Z833 Family history of diabetes mellitus: Secondary | ICD-10-CM | POA: Diagnosis not present

## 2015-04-10 DIAGNOSIS — L97112 Non-pressure chronic ulcer of right thigh with fat layer exposed: Secondary | ICD-10-CM | POA: Diagnosis not present

## 2015-04-10 DIAGNOSIS — G459 Transient cerebral ischemic attack, unspecified: Secondary | ICD-10-CM | POA: Diagnosis not present

## 2015-04-10 DIAGNOSIS — Z86711 Personal history of pulmonary embolism: Secondary | ICD-10-CM | POA: Diagnosis not present

## 2015-04-10 DIAGNOSIS — Z7901 Long term (current) use of anticoagulants: Secondary | ICD-10-CM | POA: Diagnosis not present

## 2015-04-10 DIAGNOSIS — B957 Other staphylococcus as the cause of diseases classified elsewhere: Secondary | ICD-10-CM | POA: Diagnosis present

## 2015-04-10 DIAGNOSIS — L02416 Cutaneous abscess of left lower limb: Secondary | ICD-10-CM | POA: Diagnosis present

## 2015-04-10 DIAGNOSIS — L8942 Pressure ulcer of contiguous site of back, buttock and hip, stage 2: Secondary | ICD-10-CM | POA: Diagnosis not present

## 2015-04-10 DIAGNOSIS — E119 Type 2 diabetes mellitus without complications: Secondary | ICD-10-CM | POA: Diagnosis present

## 2015-04-10 DIAGNOSIS — L02419 Cutaneous abscess of limb, unspecified: Secondary | ICD-10-CM | POA: Diagnosis not present

## 2015-04-10 DIAGNOSIS — L03116 Cellulitis of left lower limb: Secondary | ICD-10-CM | POA: Diagnosis present

## 2015-04-10 DIAGNOSIS — Z7984 Long term (current) use of oral hypoglycemic drugs: Secondary | ICD-10-CM | POA: Diagnosis not present

## 2015-04-10 DIAGNOSIS — Z7401 Bed confinement status: Secondary | ICD-10-CM | POA: Diagnosis not present

## 2015-04-10 DIAGNOSIS — B9620 Unspecified Escherichia coli [E. coli] as the cause of diseases classified elsewhere: Secondary | ICD-10-CM | POA: Diagnosis present

## 2015-04-10 DIAGNOSIS — R491 Aphonia: Secondary | ICD-10-CM | POA: Diagnosis not present

## 2015-04-10 DIAGNOSIS — Z6841 Body Mass Index (BMI) 40.0 and over, adult: Secondary | ICD-10-CM | POA: Diagnosis not present

## 2015-04-10 DIAGNOSIS — R4701 Aphasia: Secondary | ICD-10-CM | POA: Diagnosis present

## 2015-04-10 DIAGNOSIS — Z8249 Family history of ischemic heart disease and other diseases of the circulatory system: Secondary | ICD-10-CM | POA: Diagnosis not present

## 2015-04-10 DIAGNOSIS — R4182 Altered mental status, unspecified: Secondary | ICD-10-CM | POA: Diagnosis not present

## 2015-04-10 DIAGNOSIS — F339 Major depressive disorder, recurrent, unspecified: Secondary | ICD-10-CM | POA: Diagnosis not present

## 2015-04-10 LAB — GLUCOSE, CAPILLARY
Glucose-Capillary: 87 mg/dL (ref 65–99)
Glucose-Capillary: 68 mg/dL (ref 65–99)
Glucose-Capillary: 99 mg/dL (ref 65–99)
Glucose-Capillary: 63 mg/dL — ABNORMAL LOW (ref 65–99)
Glucose-Capillary: 115 mg/dL — ABNORMAL HIGH (ref 65–99)
Glucose-Capillary: 97 mg/dL (ref 65–99)

## 2015-04-10 MED ORDER — SENNA 8.6 MG PO TABS: 1.0000 | ORAL_TABLET | Freq: Every day | ORAL | Status: DC | PRN

## 2015-04-10 MED ORDER — INSULIN ASPART 100 UNIT/ML ~~LOC~~ SOLN: 0.0000 [IU] | Freq: Every day | SUBCUTANEOUS | Status: DC

## 2015-04-10 MED ORDER — CITALOPRAM HYDROBROMIDE 20 MG PO TABS
20.0000 mg | ORAL_TABLET | Freq: Every day | ORAL | Status: DC
  Administered 2015-04-10 – 2015-04-13 (×4): 20 mg via ORAL
  Filled 2015-04-10 (×4): qty 1

## 2015-04-10 MED ORDER — INSULIN ASPART 100 UNIT/ML ~~LOC~~ SOLN: 0.0000 [IU] | Freq: Three times a day (TID) | SUBCUTANEOUS | Status: DC

## 2015-04-10 MED ORDER — ASPIRIN 81 MG PO CHEW
81.0000 mg | CHEWABLE_TABLET | Freq: Every day | ORAL | Status: DC
  Administered 2015-04-10 – 2015-04-13 (×4): 81 mg via ORAL
  Filled 2015-04-10 (×3): qty 1

## 2015-04-10 NOTE — Progress Notes (Signed)
Subjective: She was admitted to the hospital with a TIA and cellulitis/sepsis from leg wound. She has chronic leg wounds and her husband ended up in the hospital and she was bedridden for some time. Yesterday she had trouble speaking but that has resolved. She has symptoms of depression.  Objective: Vital signs in last 24 hours: Temp:  [97.6 F (36.4 C)-99.3 F (37.4 C)] 98.2 F (36.8 C) (02/18 7622) Pulse Rate:  [77-99] 77 (02/18 0633) Resp:  [14-21] 16 (02/18 0633) BP: (120-179)/(40-97) 140/61 mmHg (02/18 0633) SpO2:  [98 %-100 %] 98 % (02/18 0633) Weight:  [136.5 kg (300 lb 14.9 oz)] 136.5 kg (300 lb 14.9 oz) (02/17 2200) Weight change:  Last BM Date: 04/09/15  Intake/Output from previous day: 02/17 0701 - 02/18 0700 In: -  Out: 1800 [Urine:1800]  PHYSICAL EXAM General appearance: alert, cooperative and morbidly obese Resp: clear to auscultation bilaterally Cardio: regular rate and rhythm, S1, S2 normal, no murmur, click, rub or gallop GI: soft, non-tender; bowel sounds normal; no masses,  no organomegaly Extremities: She has an ulcerated area with surrounding erythema  Lab Results:  Results for orders placed or performed during the hospital encounter of 04/09/15 (from the past 48 hour(s))  Comprehensive metabolic panel     Status: Abnormal   Collection Time: 04/09/15 11:30 AM  Result Value Ref Range   Sodium 140 135 - 145 mmol/L   Potassium 4.8 3.5 - 5.1 mmol/L   Chloride 106 101 - 111 mmol/L   CO2 25 22 - 32 mmol/L   Glucose, Bld 140 (H) 65 - 99 mg/dL   BUN 23 (H) 6 - 20 mg/dL   Creatinine, Ser 1.39 (H) 0.44 - 1.00 mg/dL   Calcium 9.1 8.9 - 10.3 mg/dL   Total Protein 7.1 6.5 - 8.1 g/dL   Albumin 3.2 (L) 3.5 - 5.0 g/dL   AST 13 (L) 15 - 41 U/L   ALT 11 (L) 14 - 54 U/L   Alkaline Phosphatase 53 38 - 126 U/L   Total Bilirubin 0.4 0.3 - 1.2 mg/dL   GFR calc non Af Amer 39 (L) >60 mL/min   GFR calc Af Amer 45 (L) >60 mL/min    Comment: (NOTE) The eGFR has been  calculated using the CKD EPI equation. This calculation has not been validated in all clinical situations. eGFR's persistently <60 mL/min signify possible Chronic Kidney Disease.    Anion gap 9 5 - 15  CBC WITH DIFFERENTIAL     Status: Abnormal   Collection Time: 04/09/15 11:30 AM  Result Value Ref Range   WBC 6.8 4.0 - 10.5 K/uL   RBC 4.12 3.87 - 5.11 MIL/uL   Hemoglobin 10.7 (L) 12.0 - 15.0 g/dL   HCT 35.2 (L) 36.0 - 46.0 %   MCV 85.4 78.0 - 100.0 fL   MCH 26.0 26.0 - 34.0 pg   MCHC 30.4 30.0 - 36.0 g/dL   RDW 15.3 11.5 - 15.5 %   Platelets 266 150 - 400 K/uL   Neutrophils Relative % 59 %   Neutro Abs 4.0 1.7 - 7.7 K/uL   Lymphocytes Relative 33 %   Lymphs Abs 2.3 0.7 - 4.0 K/uL   Monocytes Relative 6 %   Monocytes Absolute 0.4 0.1 - 1.0 K/uL   Eosinophils Relative 2 %   Eosinophils Absolute 0.1 0.0 - 0.7 K/uL   Basophils Relative 0 %   Basophils Absolute 0.0 0.0 - 0.1 K/uL  CBG monitoring, ED     Status:  Abnormal   Collection Time: 04/09/15 11:56 AM  Result Value Ref Range   Glucose-Capillary 125 (H) 65 - 99 mg/dL  I-Stat CG4 Lactic Acid, ED  (not at  [facility])     Status: None   Collection Time: 04/09/15 11:58 AM  Result Value Ref Range   Lactic Acid, Venous 1.57 0.5 - 2.0 mmol/L  Blood Culture (routine x 2)     Status: None (Preliminary result)   Collection Time: 04/09/15 12:41 PM  Result Value Ref Range   Specimen Description BLOOD DRAWN BY IV THERAPY    Special Requests BOTTLES DRAWN AEROBIC AND ANAEROBIC 10 CC EACH    Culture NO GROWTH < 24 HOURS    Report Status PENDING   Urinalysis, Routine w reflex microscopic (not at [facility])     Status: Abnormal   Collection Time: 04/09/15 12:42 PM  Result Value Ref Range   Color, Urine YELLOW YELLOW   APPearance CLEAR CLEAR   Specific Gravity, Urine 1.015 1.005 - 1.030   pH 5.5 5.0 - 8.0   Glucose, UA NEGATIVE NEGATIVE mg/dL   Hgb urine dipstick NEGATIVE NEGATIVE   Bilirubin Urine MODERATE (A) NEGATIVE   Ketones, ur 15 (A)  NEGATIVE mg/dL   Protein, ur NEGATIVE NEGATIVE mg/dL   Nitrite NEGATIVE NEGATIVE   Leukocytes, UA NEGATIVE NEGATIVE    Comment: MICROSCOPIC NOT DONE ON URINES WITH NEGATIVE PROTEIN, BLOOD, LEUKOCYTES, NITRITE, OR GLUCOSE <1000 mg/dL.  Blood Culture (routine x 2)     Status: None (Preliminary result)   Collection Time: 04/09/15 12:48 PM  Result Value Ref Range   Specimen Description BLOOD LEFT ARM    Special Requests BOTTLES DRAWN AEROBIC AND ANAEROBIC 8 CC EACH    Culture NO GROWTH < 24 HOURS    Report Status PENDING   TSH     Status: None   Collection Time: 04/09/15  6:04 PM  Result Value Ref Range   TSH 0.794 0.350 - 4.500 uIU/mL  Glucose, capillary     Status: None   Collection Time: 04/09/15 10:01 PM  Result Value Ref Range   Glucose-Capillary 87 65 - 99 mg/dL  Glucose, capillary     Status: None   Collection Time: 04/10/15 10:13 AM  Result Value Ref Range   Glucose-Capillary 68 65 - 99 mg/dL   Comment 1 Notify RN     ABGS No results for input(s): PHART, PO2ART, TCO2, HCO3 in the last 72 hours.  Invalid input(s): PCO2 CULTURES Recent Results (from the past 240 hour(s))  Blood Culture (routine x 2)     Status: None (Preliminary result)   Collection Time: 04/09/15 12:41 PM  Result Value Ref Range Status   Specimen Description BLOOD DRAWN BY IV THERAPY  Final   Special Requests BOTTLES DRAWN AEROBIC AND ANAEROBIC 10 CC EACH  Final   Culture NO GROWTH < 24 HOURS  Final   Report Status PENDING  Incomplete  Blood Culture (routine x 2)     Status: None (Preliminary result)   Collection Time: 04/09/15 12:48 PM  Result Value Ref Range Status   Specimen Description BLOOD LEFT ARM  Final   Special Requests BOTTLES DRAWN AEROBIC AND ANAEROBIC 8 CC EACH  Final   Culture NO GROWTH < 24 HOURS  Final   Report Status PENDING  Incomplete   Studies/Results: Ct Head Wo Contrast  04/09/2015  CLINICAL DATA:  65 year old female with altered mental status EXAM: CT HEAD WITHOUT CONTRAST  TECHNIQUE: Contiguous axial images were obtained from the  base of the skull through the vertex without intravenous contrast. COMPARISON:  None. FINDINGS: Negative for acute intracranial hemorrhage, acute infarction, mass, mass effect, hydrocephalus or midline shift. Gray-white differentiation is preserved throughout. Focal circumscribed hypoattenuation in the left pons likely represents a remote lacunar infarct. Mild periventricular white matter hypoattenuation consistent with chronic microvascular ischemic white matter disease. No focal scalp or calvarial abnormality. Globes and orbits are intact and symmetric. Normal aeration of the mastoid air cells and maxillary sinuses. Atherosclerotic calcification in the bilateral cavernous carotid arteries. IMPRESSION: 1. No acute intracranial abnormality. 2. 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 3. 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 4. Intracranial atherosclerosis. Electronically Signed   By: Jacqulynn Cadet M.D.   On: 04/09/2015 13:50   US Carotid Bilateral  04/09/2015  CLINICAL DATA:  Transient ischemic attack. EXAM: BILATERAL CAROTID DUPLEX ULTRASOUND TECHNIQUE: Pearline Cables scale imaging, color Doppler and duplex ultrasound were performed of bilateral carotid and vertebral arteries in the neck. COMPARISON:  None. FINDINGS: Criteria: Quantification of carotid stenosis is based on velocity parameters that correlate the residual internal carotid diameter with NASCET-based stenosis levels, using the diameter of the distal internal carotid lumen as the denominator for stenosis measurement. The following velocity measurements were obtained: RIGHT ICA:  109/32 cm/sec CCA:  643/32 cm/sec SYSTOLIC ICA/CCA RATIO:  9.51 DIASTOLIC ICA/CCA RATIO:  8.84 ECA:  101 cm/sec LEFT ICA:  132/30 cm/sec CCA:  166/06 cm/sec SYSTOLIC ICA/CCA RATIO:  3.01 DIASTOLIC ICA/CCA RATIO:  6.01 ECA:  90 cm/sec RIGHT CAROTID ARTERY: Mild plaque formation is noted in the  right carotid bulb and proximal right internal carotid artery consistent with less than 50% diameter stenosis based on Doppler and ultrasound criteria. RIGHT VERTEBRAL ARTERY:  Antegrade flow is noted. LEFT CAROTID ARTERY: Minimal to mild plaque formation is noted in the left carotid bulb and proximal left internal carotid artery consistent with less than 50% diameter stenosis based on ultrasound imaging. However, focal elevated velocities are noted in the midportion of the left internal carotid artery, but no significant plaque formation is seen in this area. LEFT VERTEBRAL ARTERY:  Antegrade flow is noted. IMPRESSION: Mild plaque formation is noted in the right carotid bulb and proximal right internal carotid artery consistent with less than 50% diameter stenosis based on ultrasound and Doppler criteria. Minimal to mild plaque formation is noted in the left carotid bulb and proximal left internal carotid artery which is consistent with less than 50% diameter stenosis based on ultrasound and Doppler criteria. However, the possibility of atheromatous ulcer formation which may be a source of emboli cannot be excluded on the basis of this exam. Electronically Signed   By: Marijo Conception, M.D.   On: 04/09/2015 17:18   Dg Chest Port 1 View  04/09/2015  CLINICAL DATA:  65 year old female with altered mental status EXAM: PORTABLE CHEST 1 VIEW COMPARISON:  Prior chest x-ray 12/25/2014 FINDINGS: Extremely low inspiratory volumes with mild bibasilar atelectasis. Low inspiratory volumes results in crowding of the pulmonary vasculature. No definite pulmonary edema. Cardiac and mediastinal contours remain within normal limits. Atherosclerotic calcifications in the transverse aorta. No focal airspace consolidation or pneumothorax. No acute osseous abnormality. IMPRESSION: Very low inspiratory volumes with mild bibasilar atelectasis. Electronically Signed   By: Jacqulynn Cadet M.D.   On: 04/09/2015 12:40    Medications:   Prior to Admission:  Prescriptions prior to admission  Medication Sig Dispense Refill Last Dose  . CELEBREX 200 MG capsule Take 200 mg by mouth  daily.    04/08/2015 at Unknown time  . Control Gel Formula Dressing (DUODERM CGF DRESSING EX) Apply 1 patch topically daily. APPLIED TO AFFECTED BUTTOCKS AREA   04/08/2015 at Unknown time  . glipiZIDE-metformin (METAGLIP) 5-500 MG tablet Take 1 tablet by mouth 2 (two) times daily before a meal.   04/09/2015 at Unknown time  . Multiple Vitamin (MULTIVITAMIN WITH MINERALS) TABS tablet Take 1 tablet by mouth daily. *CENTRUM SILVER   04/08/2015 at Unknown time  . oxyCODONE-acetaminophen (PERCOCET/ROXICET) 5-325 MG per tablet Take 1 tablet by mouth 4 (four) times daily.    04/08/2015 at Unknown time  . trandolapril (MAVIK) 4 MG tablet Take 4 mg by mouth daily.    04/08/2015 at Unknown time  . verapamil (CALAN-SR) 240 MG CR tablet Take 240 mg by mouth daily.    04/08/2015 at Unknown time  . XARELTO 20 MG TABS Take 20 mg by mouth daily with supper.    04/08/2015 at 1800   Scheduled: . aspirin  81 mg Oral Daily  . ceFEPime (MAXIPIME) IV  2 g Intravenous Q24H  . celecoxib  200 mg Oral Daily  . citalopram  20 mg Oral Daily  . glipiZIDE  5 mg Oral BID AC   And  . metFORMIN  500 mg Oral BID AC  . insulin aspart  0-20 Units Subcutaneous TID WC  . insulin aspart  0-5 Units Subcutaneous QHS  . multivitamin with minerals  1 tablet Oral Daily  . rivaroxaban  20 mg Oral Q supper  . trandolapril  4 mg Oral Daily  . verapamil  240 mg Oral Daily   Continuous: . sodium chloride 1,000 mL (04/09/15 2259)   PRN:  Assesment: She had a TIA. She has cellulitis and abscess of her leg. She is better. At baseline she has morbid obesity and chronic cellulitis of the leg and type 2 diabetes. She also has depression Principal Problem:   Aphasia Active Problems:   Type 2 diabetes mellitus ([facility])   Morbid obesity ([facility])   TIA (transient ischemic attack)   Cellulitis and abscess  of leg   Conversion aphonia    Plan: Continue IV antibiotics. Continue treatment locally for the cellulitis. Add antidepressant.    LOS: 0 days   Ailynn Gow L 04/10/2015, 11:04 AM

## 2015-04-10 NOTE — Progress Notes (Signed)
Encouraged patient to eat, gave orange juice earlier and cola this evening.  Rechecked blood sugar and increased from 63 to 115.  Patient reported she would eat better.  Meds given.  Leaving Dr. Luan Pulling a sticky note regarding patient's meds.

## 2015-04-11 ENCOUNTER — Other Ambulatory Visit (HOSPITAL_COMMUNITY): Payer: Medicare Other

## 2015-04-11 ENCOUNTER — Inpatient Hospital Stay (HOSPITAL_COMMUNITY): Payer: Medicare Other

## 2015-04-11 DIAGNOSIS — G459 Transient cerebral ischemic attack, unspecified: Secondary | ICD-10-CM

## 2015-04-11 LAB — GLUCOSE, CAPILLARY
GLUCOSE-CAPILLARY: 50 mg/dL — AB (ref 65–99)
Glucose-Capillary: 73 mg/dL (ref 65–99)
Glucose-Capillary: 87 mg/dL (ref 65–99)
Glucose-Capillary: 98 mg/dL (ref 65–99)
Glucose-Capillary: 90 mg/dL (ref 65–99)

## 2015-04-11 LAB — BASIC METABOLIC PANEL
Anion gap: 7 (ref 5–15)
BUN: 14 mg/dL (ref 6–20)
CALCIUM: 8.4 mg/dL — AB (ref 8.9–10.3)
CO2: 23 mmol/L (ref 22–32)
CREATININE: 1.11 mg/dL — AB (ref 0.44–1.00)
Chloride: 113 mmol/L — ABNORMAL HIGH (ref 101–111)
GFR calc Af Amer: 59 mL/min — ABNORMAL LOW (ref >60)
GFR, EST NON AFRICAN AMERICAN: 51 mL/min — AB (ref >60)
GLUCOSE: 80 mg/dL (ref 65–99)
Potassium: 4 mmol/L (ref 3.5–5.1)
Sodium: 143 mmol/L (ref 135–145)

## 2015-04-11 MED ORDER — CLINDAMYCIN PHOSPHATE 900 MG/50ML IV SOLN
900.0000 mg | Freq: Three times a day (TID) | INTRAVENOUS | Status: DC
  Administered 2015-04-11 – 2015-04-13 (×8): 900 mg via INTRAVENOUS
  Filled 2015-04-11 (×11): qty 50

## 2015-04-11 MED ORDER — CLINDAMYCIN PHOSPHATE 900 MG/50ML IV SOLN
INTRAVENOUS | Status: AC
  Filled 2015-04-11: qty 50

## 2015-04-11 MED ORDER — DEXTROSE 5 % IV SOLN
1.0000 g | Freq: Three times a day (TID) | INTRAVENOUS | Status: DC
  Administered 2015-04-11 – 2015-04-13 (×7): 1 g via INTRAVENOUS
  Filled 2015-04-11 (×10): qty 1

## 2015-04-11 NOTE — Progress Notes (Signed)
Pt has a history of red mans syndrome with vancomycin. Per Dr. Luan Pulling she will be started on clindamycin dosed by pharmacy.

## 2015-04-11 NOTE — Progress Notes (Signed)
ANTIBIOTIC CONSULT NOTE-Preliminary  Pharmacy Consult for clindamycin Indication: cellulitis, gram positive cocci in blood Cx  Allergies  Allergen Reactions  . Vancomycin Hives and Rash    "big red bumps, then they turned white, then I shedded like a snake."     Patient Measurements: Height: 5\' 3"  (160 cm) Weight: (!) 300 lb 14.9 oz (136.5 kg) IBW/kg (Calculated) : 52.4   Vital Signs: Temp: 98 F (36.7 C) (02/18 2154) Temp Source: Oral (02/18 2154) BP: 186/63 mmHg (02/18 2154) Pulse Rate: 80 (02/18 2154)  Labs:  Recent Labs  04/09/15 1130  WBC 6.8  HGB 10.7*  PLT 266  CREATININE 1.39*    Estimated Creatinine Clearance: 55.5 mL/min (by C-G formula based on Cr of 1.39).  No results for input(s): VANCOTROUGH, VANCOPEAK, VANCORANDOM, GENTTROUGH, GENTPEAK, GENTRANDOM, TOBRATROUGH, TOBRAPEAK, TOBRARND, AMIKACINPEAK, AMIKACINTROU, AMIKACIN in the last 72 hours.   Microbiology: Recent Results (from the past 720 hour(s))  Blood Culture (routine x 2)     Status: None (Preliminary result)   Collection Time: 04/09/15 12:41 PM  Result Value Ref Range Status   Specimen Description BLOOD DRAWN BY IV THERAPY  Final   Special Requests BOTTLES DRAWN AEROBIC AND ANAEROBIC 10 CC EACH  Final   Culture NO GROWTH < 24 HOURS  Final   Report Status PENDING  Incomplete  Urine culture     Status: None (Preliminary result)   Collection Time: 04/09/15 12:42 PM  Result Value Ref Range Status   Specimen Description URINE, CATHETERIZED  Final   Special Requests NONE  Final   Culture   Final    TOO YOUNG TO READ Performed at [facility]    Report Status PENDING  Incomplete  Blood Culture (routine x 2)     Status: None (Preliminary result)   Collection Time: 04/09/15 12:48 PM  Result Value Ref Range Status   Specimen Description BLOOD LEFT ARM  Final   Special Requests BOTTLES DRAWN AEROBIC AND ANAEROBIC 8 CC EACH  Final   Culture  Setup Time   Final    GRAM POSITIVE COCCI IN  CLUSTERS ANAEROBIC BOTTLE ONLY CRITICAL RESULT CALLED TO, READ BACK BY AND VERIFIED WITH: K. SANTOS RN [phone] [place] Performed at [facility]    Culture NO GROWTH < 24 HOURS  Final   Report Status PENDING  Incomplete    Medical History: Past Medical History  Diagnosis Date  . Essential hypertension, benign   . Type 2 diabetes mellitus ([place])   . Skin ulcer of thigh ([place])   . Pulmonary emboli ([place]) 2009  . History of MRSA infection   . History of panniculitis   . Cellulitis lower extremities    4 day hospitalization 12/25/14-12/29/14    Medications:  Scheduled:  . aspirin  81 mg Oral Daily  . ceFEPime (MAXIPIME) IV  2 g Intravenous Q24H  . celecoxib  200 mg Oral Daily  . citalopram  20 mg Oral Daily  . clindamycin (CLEOCIN) IV  900 mg Intravenous Q8H  . glipiZIDE  5 mg Oral BID AC   And  . metFORMIN  500 mg Oral BID AC  . insulin aspart  0-20 Units Subcutaneous TID WC  . insulin aspart  0-5 Units Subcutaneous QHS  . multivitamin with minerals  1 tablet Oral Daily  . rivaroxaban  20 mg Oral Q supper  . trandolapril  4 mg Oral Daily  . verapamil  240 mg Oral Daily  Assessment: 65 yo female originally c/o inability to speak. Admitted for TIA and cellulitis.  Tonight BCx showed GPC.  Pt allergic to vancomycin.  Clindamycin started.   Goal of Therapy:  eradication of infeciton  Plan:  Preliminary review of pertinent patient information completed.  Protocol will be initiated with a one-time dose(s) of clindamycin 900mg  IV Q 8 hours.  Forestine Na clinical pharmacist will complete review during morning rounds to assess patient and finalize treatment regimen.  Nyra Capes, RPH 04/11/2015,3:32 AM

## 2015-04-11 NOTE — Progress Notes (Signed)
ANTIBIOTIC CONSULT NOTE- Follow up  Pharmacy Consult for clindamycin Indication: cellulitis, gram positive cocci in blood Cx  Allergies  Allergen Reactions  . Vancomycin Hives and Rash    "big red bumps, then they turned white, then I shedded like a snake."     Patient Measurements: Height: 5\' 3"  (160 cm) Weight: (!) 300 lb 14.9 oz (136.5 kg) IBW/kg (Calculated) : 52.4   Vital Signs: Temp: 97.9 F (36.6 C) (02/19 0500) Temp Source: Oral (02/19 0500) BP: 145/58 mmHg (02/19 0500) Pulse Rate: 80 (02/18 2154)  Labs:  Recent Labs  04/09/15 1130 04/11/15 0652  WBC 6.8  --   HGB 10.7*  --   PLT 266  --   CREATININE 1.39* 1.11*    Estimated Creatinine Clearance: 69.5 mL/min (by C-G formula based on Cr of 1.11).  No results for input(s): VANCOTROUGH, VANCOPEAK, VANCORANDOM, GENTTROUGH, GENTPEAK, GENTRANDOM, TOBRATROUGH, TOBRAPEAK, TOBRARND, AMIKACINPEAK, AMIKACINTROU, AMIKACIN in the last 72 hours.   Microbiology: Recent Results (from the past 720 hour(s))  Blood Culture (routine x 2)     Status: None (Preliminary result)   Collection Time: 04/09/15 12:41 PM  Result Value Ref Range Status   Specimen Description BLOOD DRAWN BY IV THERAPY  Final   Special Requests BOTTLES DRAWN AEROBIC AND ANAEROBIC 10 CC EACH  Final   Culture NO GROWTH 2 DAYS  Final   Report Status PENDING  Incomplete  Urine culture     Status: None (Preliminary result)   Collection Time: 04/09/15 12:42 PM  Result Value Ref Range Status   Specimen Description URINE, CATHETERIZED  Final   Special Requests NONE  Final   Culture   Final    TOO YOUNG TO READ Performed at [facility]    Report Status PENDING  Incomplete  Blood Culture (routine x 2)     Status: None (Preliminary result)   Collection Time: 04/09/15 12:48 PM  Result Value Ref Range Status   Specimen Description BLOOD LEFT ARM  Final   Special Requests BOTTLES DRAWN AEROBIC AND ANAEROBIC 8 CC EACH  Final   Culture  Setup Time    Final    GRAM POSITIVE COCCI IN CLUSTERS ANAEROBIC BOTTLE ONLY CRITICAL RESULT CALLED TO, READ BACK BY AND VERIFIED WITH: K. SANTOS RN [phone] [place]    Culture   Final    TOO YOUNG TO READ Performed at [facility]    Report Status PENDING  Incomplete    Medical History: Past Medical History  Diagnosis Date  . Essential hypertension, benign   . Type 2 diabetes mellitus ([place])   . Skin ulcer of thigh ([place])   . Pulmonary emboli ([place]) 2009  . History of MRSA infection   . History of panniculitis   . Cellulitis lower extremities    4 day hospitalization 12/25/14-12/29/14    Medications:  Scheduled:  . aspirin  81 mg Oral Daily  . ceFEPime (MAXIPIME) IV  2 g Intravenous Q24H  . celecoxib  200 mg Oral Daily  . citalopram  20 mg Oral Daily  . clindamycin (CLEOCIN) IV  900 mg Intravenous Q8H  . glipiZIDE  5 mg Oral BID AC   And  . metFORMIN  500 mg Oral BID AC  . insulin aspart  0-20 Units Subcutaneous TID WC  . insulin aspart  0-5 Units Subcutaneous QHS  . multivitamin with minerals  1 tablet Oral Daily  . rivaroxaban  20 mg Oral Q supper  .  trandolapril  4 mg Oral Daily  . verapamil  240 mg Oral Daily    Assessment: 65 yo female originally c/o inability to speak. Admitted for TIA and cellulitis.  Tonight BCx showed GPC.  Pt allergic to vancomycin.  Clindamycin started.   Goal of Therapy:  eradication of infeciton  Plan:  Continue Clindamycin 900 mg IV every 8 hours Labs per protocol F/U oral therapy when appropriate  Chriss Czar, RPH 04/11/2015,8:59 AM

## 2015-04-11 NOTE — Progress Notes (Signed)
Pharmacy Antibiotic Note  [name] is a 65 y.o. female admitted on 04/09/2015 with UTI.  Pharmacy has been consulted for Cefepime dosing.  Plan: Renal function has improved. Change Cefepime to 1 GM IV every 8 hours Monitor renal function, adjust dose if necessary. F/U cultures  Height: 5\' 3"  (160 cm) Weight: (!) 300 lb 14.9 oz (136.5 kg) IBW/kg (Calculated) : 52.4  Temp (24hrs), Avg:98 F (36.7 C), Min:97.9 F (36.6 C), Max:98 F (36.7 C)   Recent Labs Lab 04/09/15 1130 04/09/15 1158 04/11/15 0652  WBC 6.8  --   --   CREATININE 1.39*  --  1.11*  LATICACIDVEN  --  1.57  --     Estimated Creatinine Clearance: 69.5 mL/min (by C-G formula based on Cr of 1.11).    Allergies  Allergen Reactions  . Vancomycin Hives and Rash    "big red bumps, then they turned white, then I shedded like a snake."     Antimicrobials this admission: Cefpime  >>  2/17 >   Dose adjustments this admission: 2/18 Cefepime 2 GM IV every 24 hours 2/19 Cefepime 1 GM IV every 8 hours  Microbiology results:  BCx:  pending  UCx:  pending   Sputum:    MRSA PCR:   Thank you for allowing pharmacy to be a part of this patient's care.  Chriss Czar 04/11/2015 9:01 AM

## 2015-04-11 NOTE — Progress Notes (Signed)
Subjective: She says she feels okay. No new complaints. She has a positive blood culture and based on the Gram stain this may be Staphylococcus. This is one of 2. She still has complaints of depression but was started on antidepressant yesterday. Workup for stroke is thus far negative but echocardiogram is pending  Objective: Vital signs in last 24 hours: Temp:  [97.9 F (36.6 C)-98 F (36.7 C)] 97.9 F (36.6 C) (02/19 0500) Pulse Rate:  [80-82] 80 (02/18 2154) Resp:  [16] 16 (02/19 0500) BP: (145-186)/(58-63) 145/58 mmHg (02/19 0500) SpO2:  [99 %] 99 % (02/19 0500) Weight change:  Last BM Date: 04/10/15 (smear)  Intake/Output from previous day: 02/18 0701 - 02/19 0700 In: 2052.1 [P.O.:600; I.V.:1402.1; IV Piggyback:50] Out: -   PHYSICAL EXAM General appearance: alert, cooperative, mild distress and morbidly obese Resp: clear to auscultation bilaterally Cardio: regular rate and rhythm, S1, S2 normal, no murmur, click, rub or gallop GI: soft, non-tender; bowel sounds normal; no masses,  no organomegaly Extremities: She still has an ulceration on her leg with surrounding erythema but it does look better  Lab Results:  Results for orders placed or performed during the hospital encounter of 04/09/15 (from the past 48 hour(s))  Comprehensive metabolic panel     Status: Abnormal   Collection Time: 04/09/15 11:30 AM  Result Value Ref Range   Sodium 140 135 - 145 mmol/L   Potassium 4.8 3.5 - 5.1 mmol/L   Chloride 106 101 - 111 mmol/L   CO2 25 22 - 32 mmol/L   Glucose, Bld 140 (H) 65 - 99 mg/dL   BUN 23 (H) 6 - 20 mg/dL   Creatinine, Ser 1.39 (H) 0.44 - 1.00 mg/dL   Calcium 9.1 8.9 - 10.3 mg/dL   Total Protein 7.1 6.5 - 8.1 g/dL   Albumin 3.2 (L) 3.5 - 5.0 g/dL   AST 13 (L) 15 - 41 U/L   ALT 11 (L) 14 - 54 U/L   Alkaline Phosphatase 53 38 - 126 U/L   Total Bilirubin 0.4 0.3 - 1.2 mg/dL   GFR calc non Af Amer 39 (L) >60 mL/min   GFR calc Af Amer 45 (L) >60 mL/min    Comment:  (NOTE) The eGFR has been calculated using the CKD EPI equation. This calculation has not been validated in all clinical situations. eGFR's persistently <60 mL/min signify possible Chronic Kidney Disease.    Anion gap 9 5 - 15  CBC WITH DIFFERENTIAL     Status: Abnormal   Collection Time: 04/09/15 11:30 AM  Result Value Ref Range   WBC 6.8 4.0 - 10.5 K/uL   RBC 4.12 3.87 - 5.11 MIL/uL   Hemoglobin 10.7 (L) 12.0 - 15.0 g/dL   HCT 35.2 (L) 36.0 - 46.0 %   MCV 85.4 78.0 - 100.0 fL   MCH 26.0 26.0 - 34.0 pg   MCHC 30.4 30.0 - 36.0 g/dL   RDW 15.3 11.5 - 15.5 %   Platelets 266 150 - 400 K/uL   Neutrophils Relative % 59 %   Neutro Abs 4.0 1.7 - 7.7 K/uL   Lymphocytes Relative 33 %   Lymphs Abs 2.3 0.7 - 4.0 K/uL   Monocytes Relative 6 %   Monocytes Absolute 0.4 0.1 - 1.0 K/uL   Eosinophils Relative 2 %   Eosinophils Absolute 0.1 0.0 - 0.7 K/uL   Basophils Relative 0 %   Basophils Absolute 0.0 0.0 - 0.1 K/uL  CBG monitoring, ED  Status: Abnormal   Collection Time: 04/09/15 11:56 AM  Result Value Ref Range   Glucose-Capillary 125 (H) 65 - 99 mg/dL  I-Stat CG4 Lactic Acid, ED  (not at  [facility])     Status: None   Collection Time: 04/09/15 11:58 AM  Result Value Ref Range   Lactic Acid, Venous 1.57 0.5 - 2.0 mmol/L  Blood Culture (routine x 2)     Status: None (Preliminary result)   Collection Time: 04/09/15 12:41 PM  Result Value Ref Range   Specimen Description BLOOD DRAWN BY IV THERAPY    Special Requests BOTTLES DRAWN AEROBIC AND ANAEROBIC 10 CC EACH    Culture NO GROWTH 2 DAYS    Report Status PENDING   Urinalysis, Routine w reflex microscopic (not at [facility])     Status: Abnormal   Collection Time: 04/09/15 12:42 PM  Result Value Ref Range   Color, Urine YELLOW YELLOW   APPearance CLEAR CLEAR   Specific Gravity, Urine 1.015 1.005 - 1.030   pH 5.5 5.0 - 8.0   Glucose, UA NEGATIVE NEGATIVE mg/dL   Hgb urine dipstick NEGATIVE NEGATIVE   Bilirubin Urine MODERATE (A) NEGATIVE    Ketones, ur 15 (A) NEGATIVE mg/dL   Protein, ur NEGATIVE NEGATIVE mg/dL   Nitrite NEGATIVE NEGATIVE   Leukocytes, UA NEGATIVE NEGATIVE    Comment: MICROSCOPIC NOT DONE ON URINES WITH NEGATIVE PROTEIN, BLOOD, LEUKOCYTES, NITRITE, OR GLUCOSE <1000 mg/dL.  Urine culture     Status: None (Preliminary result)   Collection Time: 04/09/15 12:42 PM  Result Value Ref Range   Specimen Description URINE, CATHETERIZED    Special Requests NONE    Culture      TOO YOUNG TO READ Performed at [facility]    Report Status PENDING   Blood Culture (routine x 2)     Status: None (Preliminary result)   Collection Time: 04/09/15 12:48 PM  Result Value Ref Range   Specimen Description BLOOD LEFT ARM    Special Requests BOTTLES DRAWN AEROBIC AND ANAEROBIC 8 CC EACH    Culture  Setup Time      GRAM POSITIVE COCCI IN CLUSTERS ANAEROBIC BOTTLE ONLY CRITICAL RESULT CALLED TO, READ BACK BY AND VERIFIED WITH: K. SANTOS RN [place]    Culture      TOO YOUNG TO READ Performed at [facility]    Report Status PENDING   TSH     Status: None   Collection Time: 04/09/15  6:04 PM  Result Value Ref Range   TSH 0.794 0.350 - 4.500 uIU/mL  Glucose, capillary     Status: None   Collection Time: 04/09/15 10:01 PM  Result Value Ref Range   Glucose-Capillary 87 65 - 99 mg/dL  Glucose, capillary     Status: None   Collection Time: 04/10/15 10:13 AM  Result Value Ref Range   Glucose-Capillary 68 65 - 99 mg/dL   Comment 1 Notify RN   Glucose, capillary     Status: None   Collection Time: 04/10/15 11:57 AM  Result Value Ref Range   Glucose-Capillary 99 65 - 99 mg/dL   Comment 1 Notify RN   Glucose, capillary     Status: Abnormal   Collection Time: 04/10/15  4:35 PM  Result Value Ref Range   Glucose-Capillary 63 (L) 65 - 99 mg/dL   Comment 1 Notify RN   Glucose, capillary     Status: Abnormal   Collection Time: 04/10/15  6:28 PM  Result Value Ref Range    Glucose-Capillary 115 (H) 65 - 99 mg/dL   Comment 1 Notify RN   Glucose, capillary     Status: None   Collection Time: 04/10/15  8:14 PM  Result Value Ref Range   Glucose-Capillary 97 65 - 99 mg/dL   Comment 1 Notify RN    Comment 2 Document in Chart   Basic metabolic panel     Status: Abnormal   Collection Time: 04/11/15  6:52 AM  Result Value Ref Range   Sodium 143 135 - 145 mmol/L   Potassium 4.0 3.5 - 5.1 mmol/L   Chloride 113 (H) 101 - 111 mmol/L   CO2 23 22 - 32 mmol/L   Glucose, Bld 80 65 - 99 mg/dL   BUN 14 6 - 20 mg/dL   Creatinine, Ser 1.11 (H) 0.44 - 1.00 mg/dL   Calcium 8.4 (L) 8.9 - 10.3 mg/dL   GFR calc non Af Amer 51 (L) >60 mL/min   GFR calc Af Amer 59 (L) >60 mL/min    Comment: (NOTE) The eGFR has been calculated using the CKD EPI equation. This calculation has not been validated in all clinical situations. eGFR's persistently <60 mL/min signify possible Chronic Kidney Disease.    Anion gap 7 5 - 15  Glucose, capillary     Status: None   Collection Time: 04/11/15  7:56 AM  Result Value Ref Range   Glucose-Capillary 73 65 - 99 mg/dL   Comment 1 Notify RN     ABGS No results for input(s): PHART, PO2ART, TCO2, HCO3 in the last 72 hours.  Invalid input(s): PCO2 CULTURES Recent Results (from the past 240 hour(s))  Blood Culture (routine x 2)     Status: None (Preliminary result)   Collection Time: 04/09/15 12:41 PM  Result Value Ref Range Status   Specimen Description BLOOD DRAWN BY IV THERAPY  Final   Special Requests BOTTLES DRAWN AEROBIC AND ANAEROBIC 10 CC EACH  Final   Culture NO GROWTH 2 DAYS  Final   Report Status PENDING  Incomplete  Urine culture     Status: None (Preliminary result)   Collection Time: 04/09/15 12:42 PM  Result Value Ref Range Status   Specimen Description URINE, CATHETERIZED  Final   Special Requests NONE  Final   Culture   Final    TOO YOUNG TO READ Performed at [facility]    Report Status PENDING  Incomplete   Blood Culture (routine x 2)     Status: None (Preliminary result)   Collection Time: 04/09/15 12:48 PM  Result Value Ref Range Status   Specimen Description BLOOD LEFT ARM  Final   Special Requests BOTTLES DRAWN AEROBIC AND ANAEROBIC 8 CC EACH  Final   Culture  Setup Time   Final    GRAM POSITIVE COCCI IN CLUSTERS ANAEROBIC BOTTLE ONLY CRITICAL RESULT CALLED TO, READ BACK BY AND VERIFIED WITH: K. SANTOS RN [place]    Culture   Final    TOO YOUNG TO READ Performed at [facility]    Report Status PENDING  Incomplete   Studies/Results: Ct Head Wo Contrast  04/09/2015  CLINICAL DATA:  65 year old female with altered mental status EXAM: CT HEAD WITHOUT CONTRAST TECHNIQUE: Contiguous axial images were obtained from the base of the skull through the vertex without intravenous contrast. COMPARISON:  None. FINDINGS: Negative for acute intracranial hemorrhage, acute infarction, mass, mass effect, hydrocephalus  or midline shift. Gray-white differentiation is preserved throughout. Focal circumscribed hypoattenuation in the left pons likely represents a remote lacunar infarct. Mild periventricular white matter hypoattenuation consistent with chronic microvascular ischemic white matter disease. No focal scalp or calvarial abnormality. Globes and orbits are intact and symmetric. Normal aeration of the mastoid air cells and maxillary sinuses. Atherosclerotic calcification in the bilateral cavernous carotid arteries. IMPRESSION: 1. No acute intracranial abnormality. 2. 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 3. 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 4. Intracranial atherosclerosis. Electronically Signed   By: Jacqulynn Cadet M.D.   On: 04/09/2015 13:50   US Carotid Bilateral  04/09/2015  CLINICAL DATA:  Transient ischemic attack. EXAM: BILATERAL CAROTID DUPLEX ULTRASOUND TECHNIQUE: Pearline Cables scale imaging, color Doppler and duplex  ultrasound were performed of bilateral carotid and vertebral arteries in the neck. COMPARISON:  None. FINDINGS: Criteria: Quantification of carotid stenosis is based on velocity parameters that correlate the residual internal carotid diameter with NASCET-based stenosis levels, using the diameter of the distal internal carotid lumen as the denominator for stenosis measurement. The following velocity measurements were obtained: RIGHT ICA:  109/32 cm/sec CCA:  790/38 cm/sec SYSTOLIC ICA/CCA RATIO:  3.33 DIASTOLIC ICA/CCA RATIO:  8.32 ECA:  101 cm/sec LEFT ICA:  132/30 cm/sec CCA:  919/16 cm/sec SYSTOLIC ICA/CCA RATIO:  6.06 DIASTOLIC ICA/CCA RATIO:  0.04 ECA:  90 cm/sec RIGHT CAROTID ARTERY: Mild plaque formation is noted in the right carotid bulb and proximal right internal carotid artery consistent with less than 50% diameter stenosis based on Doppler and ultrasound criteria. RIGHT VERTEBRAL ARTERY:  Antegrade flow is noted. LEFT CAROTID ARTERY: Minimal to mild plaque formation is noted in the left carotid bulb and proximal left internal carotid artery consistent with less than 50% diameter stenosis based on ultrasound imaging. However, focal elevated velocities are noted in the midportion of the left internal carotid artery, but no significant plaque formation is seen in this area. LEFT VERTEBRAL ARTERY:  Antegrade flow is noted. IMPRESSION: Mild plaque formation is noted in the right carotid bulb and proximal right internal carotid artery consistent with less than 50% diameter stenosis based on ultrasound and Doppler criteria. Minimal to mild plaque formation is noted in the left carotid bulb and proximal left internal carotid artery which is consistent with less than 50% diameter stenosis based on ultrasound and Doppler criteria. However, the possibility of atheromatous ulcer formation which may be a source of emboli cannot be excluded on the basis of this exam. Electronically Signed   By: Marijo Conception, M.D.    On: 04/09/2015 17:18   Dg Chest Port 1 View  04/09/2015  CLINICAL DATA:  65 year old female with altered mental status EXAM: PORTABLE CHEST 1 VIEW COMPARISON:  Prior chest x-ray 12/25/2014 FINDINGS: Extremely low inspiratory volumes with mild bibasilar atelectasis. Low inspiratory volumes results in crowding of the pulmonary vasculature. No definite pulmonary edema. Cardiac and mediastinal contours remain within normal limits. Atherosclerotic calcifications in the transverse aorta. No focal airspace consolidation or pneumothorax. No acute osseous abnormality. IMPRESSION: Very low inspiratory volumes with mild bibasilar atelectasis. Electronically Signed   By: Jacqulynn Cadet M.D.   On: 04/09/2015 12:40    Medications:  Prior to Admission:  Prescriptions prior to admission  Medication Sig Dispense Refill Last Dose  . CELEBREX 200 MG capsule Take 200 mg by mouth daily.    04/08/2015 at Unknown time  . Control Gel Formula Dressing (DUODERM CGF DRESSING EX) Apply 1 patch topically daily. APPLIED TO  AFFECTED BUTTOCKS AREA   04/08/2015 at Unknown time  . glipiZIDE-metformin (METAGLIP) 5-500 MG tablet Take 1 tablet by mouth 2 (two) times daily before a meal.   04/09/2015 at Unknown time  . Multiple Vitamin (MULTIVITAMIN WITH MINERALS) TABS tablet Take 1 tablet by mouth daily. *CENTRUM SILVER   04/08/2015 at Unknown time  . oxyCODONE-acetaminophen (PERCOCET/ROXICET) 5-325 MG per tablet Take 1 tablet by mouth 4 (four) times daily.    04/08/2015 at Unknown time  . trandolapril (MAVIK) 4 MG tablet Take 4 mg by mouth daily.    04/08/2015 at Unknown time  . verapamil (CALAN-SR) 240 MG CR tablet Take 240 mg by mouth daily.    04/08/2015 at Unknown time  . XARELTO 20 MG TABS Take 20 mg by mouth daily with supper.    04/08/2015 at 1800   Scheduled: . aspirin  81 mg Oral Daily  . ceFEPime (MAXIPIME) IV  1 g Intravenous Q8H  . celecoxib  200 mg Oral Daily  . citalopram  20 mg Oral Daily  . clindamycin (CLEOCIN) IV   900 mg Intravenous Q8H  . glipiZIDE  5 mg Oral BID AC   And  . metFORMIN  500 mg Oral BID AC  . insulin aspart  0-20 Units Subcutaneous TID WC  . insulin aspart  0-5 Units Subcutaneous QHS  . multivitamin with minerals  1 tablet Oral Daily  . rivaroxaban  20 mg Oral Q supper  . trandolapril  4 mg Oral Daily  . verapamil  240 mg Oral Daily   Continuous: . sodium chloride 1,000 mL (04/10/15 2250)   OHY:WVPXT  Assesment: She was admitted with probable TIA. At baseline she has morbid obesity with very limited ambulatory ability. She has chronic cellulitis and abscess of her leg and is currently with a positive blood culture. She has diabetes at baseline which is fairly well controlled. She has morbid obesity. 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 Await sensitivity results and full culture results.    LOS: 1 day   Brentley Horrell L 04/11/2015, 10:27 AM

## 2015-04-11 NOTE — Progress Notes (Signed)
Critical lab results for gram positive cocci in clusters in anarobic bottle. Dr. Luan Pulling notified and wants to add vancomycin per pharmacy dosing.

## 2015-04-12 DIAGNOSIS — F329 Major depressive disorder, single episode, unspecified: Secondary | ICD-10-CM | POA: Diagnosis present

## 2015-04-12 DIAGNOSIS — F32A Depression, unspecified: Secondary | ICD-10-CM | POA: Diagnosis present

## 2015-04-12 LAB — GLUCOSE, CAPILLARY
GLUCOSE-CAPILLARY: 96 mg/dL (ref 65–99)
GLUCOSE-CAPILLARY: 102 mg/dL — AB (ref 65–99)
GLUCOSE-CAPILLARY: 54 mg/dL — AB (ref 65–99)
Glucose-Capillary: 59 mg/dL — ABNORMAL LOW (ref 65–99)
Glucose-Capillary: 119 mg/dL — ABNORMAL HIGH (ref 65–99)

## 2015-04-12 LAB — URINE CULTURE: Culture: 40000

## 2015-04-12 LAB — HEMOGLOBIN A1C
HEMOGLOBIN A1C: 6.2 % — AB (ref 4.8–5.6)
Mean Plasma Glucose: 131 mg/dL

## 2015-04-12 MED ORDER — STROKE: EARLY STAGES OF RECOVERY BOOK
Freq: Once | Status: AC
  Administered 2015-04-12: 10:00:00
  Filled 2015-04-12: qty 1

## 2015-04-12 MED ORDER — AMMONIUM LACTATE 12 % EX LOTN
TOPICAL_LOTION | Freq: Two times a day (BID) | CUTANEOUS | Status: DC
  Administered 2015-04-13: 08:00:00 via TOPICAL
  Filled 2015-04-12: qty 400

## 2015-04-12 MED ORDER — LISINOPRIL 10 MG PO TABS
20.0000 mg | ORAL_TABLET | Freq: Every day | ORAL | Status: DC
  Administered 2015-04-12 – 2015-04-13 (×2): 20 mg via ORAL
  Filled 2015-04-12 (×2): qty 2

## 2015-04-12 MED ORDER — SODIUM CHLORIDE 0.9 % IV SOLN
1000.0000 mL | INTRAVENOUS | Status: DC
  Administered 2015-04-12 (×2): 1000 mL via INTRAVENOUS

## 2015-04-12 NOTE — Evaluation (Signed)
Physical Therapy Evaluation Patient Details Name: JAIYANA PRESSLER MRN: [id number] DOB: Oct 21, 1950 Today's Date: 04/12/2015   History of Present Illness  65yo morbidly obese black female comes to [facility] after 2 hour epidsodic expressive aphasia after 3 days of bedrest due to mobility impairments, while husband was hospitalized. PMH: chronic BLE lymphedema, cellulitis, HTN, DM, history of PE (on Xarelto). Upon admission head CT clear, but pt noted to have new wounds and cellulitis.  Chronic catheterization since November in the setting of chronic perineal/groin wounds. Chronic amb impairment due to BLE upper lymphedema which is not appropriate for compression therapy due to location.   Clinical Impression  Pt screened for deficits related to suspected TIA/CVA in the setting of fully resolved expressive aphasia: pt denies any sudden acute changes in strength, balance, sensation, or other. Pt demonstrating profound impairment of strength, impaired activity tolerance, and impaired skin integrity, requiring heavy physical assistance or all bed mobility at this time. Pt will benefit from skilled PT intervention to address these impairments and limitations and to restore to PLOF. Due to chronic wide/based gait related to chronic BLE lymphedema, pt would benefit from bariatric RW (extra width) to allow for greater indep and safe use of AD at return to baseline.     Follow Up Recommendations SNF    Equipment Recommendations  Other (comment) (bariatric rollator. )    Recommendations for Other Services       Precautions / Restrictions Precautions Precautions: None Restrictions Weight Bearing Restrictions: No      Mobility  Bed Mobility Overal bed mobility: +2 for physical assistance;Needs Assistance Bed Mobility: Supine to Sit     Supine to sit: Total assist;+2 for physical assistance     General bed mobility comments: Pt unable to perfrom more than a few inches indep; pt cannot move LLE indep or  self assisted. Pt becomes SOB and with tachycardia after attempt. No additional attempt made due to inability to visualize catheter placement beneath LLE paniculus.     Transfers                    Ambulation/Gait                Stairs            Wheelchair Mobility    Modified Rankin (Stroke Patients Only)       Balance                                             Pertinent Vitals/Pain Pain Assessment: No/denies pain    Home Living Family/patient expects to be discharged to:: Private residence Living Arrangements: Spouse/significant other Available Help at Discharge: Family Type of Home: House         Home Equipment: Environmental consultant - 2 wheels;Hospital bed;Toilet riser;Wheelchair - Education officer, community - power Additional Comments: Power chair does not fit in house (never been used); mattress on hospital bed is too high, hence pt is unable to exit/enter bed without heavy physical assistance, hence 3 days of being bed ridden PTA.     Prior Function Level of Independence: Needs assistance   Gait / Transfers Assistance Needed: HH AMB distances limited to ~20-77ft for last 3-6 months; household distances only for the past 6 years per pt.   ADL's / Homemaking Assistance Needed: needs assistance.         Hand Dominance  Extremity/Trunk Assessment   Upper Extremity Assessment: Generalized weakness           Lower Extremity Assessment: Generalized weakness      Cervical / Trunk Assessment:  (very weak, poor tolerance to activity. )  Communication   Communication: No difficulties (No problem; all aphasia resolved. )  Cognition Arousal/Alertness: Awake/alert Behavior During Therapy: WFL for tasks assessed/performed Overall Cognitive Status: Within Functional Limits for tasks assessed       Memory: Decreased recall of precautions              General Comments      Exercises        Assessment/Plan    PT  Assessment Patient needs continued PT services  PT Diagnosis Difficulty walking;Generalized weakness   PT Problem List Decreased strength;Decreased range of motion;Decreased activity tolerance;Decreased balance;Decreased mobility;Decreased coordination;Decreased cognition;Decreased knowledge of use of DME;Decreased knowledge of precautions;Cardiopulmonary status limiting activity;Obesity;Decreased skin integrity  PT Treatment Interventions DME instruction;Functional mobility training;Therapeutic activities;Therapeutic exercise;Balance training;Patient/family education   PT Goals (Current goals can be found in the Care Plan section) Acute Rehab PT Goals Patient Stated Goal: regain indep in ambulation.  PT Goal Formulation: With patient Time For Goal Achievement: 04/26/15 Potential to Achieve Goals: Fair    Frequency Min 3X/week   Barriers to discharge Decreased caregiver support;Inaccessible home environment caregiver has medical limitations in ability to provide physical assistance.     Co-evaluation               End of Session   Activity Tolerance: Patient limited by fatigue Patient left: in bed;with call bell/phone within reach Nurse Communication: Mobility status         Time: [id number] PT Time Calculation (min) (ACUTE ONLY): 24 min   Charges:   PT Evaluation $PT Eval Moderate Complexity: 1 Procedure PT Treatments $Therapeutic Activity: 8-22 mins   PT G Codes:        3:31 PM, 04-29-2015 Etta Grandchild, PT, DPT PRN Physical Therapist at [place] License # [id number] (wireless)  [phone] (mobile)

## 2015-04-12 NOTE — NC FL2 (Signed)
[place] MEDICAID FL2 LEVEL OF CARE SCREENING TOOL     IDENTIFICATION  Patient Name: [name] Birthdate: Sep 21, 1950 Sex: female Admission Date (Current Location): 04/09/2015  [facility] and [place] Number:  [employer] and Address:  [place] [address] [place]      Provider Number: [id number]  Attending Physician Name and Address:  Sinda Du, MD  Relative Name and Phone Number:       Current Level of Care: Hospital Recommended Level of Care:   Prior Approval Number:    Date Approved/Denied:   PASRR Number:  ([id number])  Discharge Plan: SNF    Current Diagnoses: Patient Active Problem List   Diagnosis Date Noted  . Depression 04/12/2015  . TIA (transient ischemic attack) 04/09/2015  . Aphasia 04/09/2015  . Cellulitis and abscess of leg 04/09/2015  . 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  . Fever, unspecified 12/25/2014  . Sepsis ([place]) 12/25/2014  . Cellulitis 12/25/2014  . Essential hypertension, benign 12/20/2011  . History of pulmonary embolus (PE) 12/20/2011  . Morbid obesity ([place]) 12/20/2011  . Preoperative cardiovascular examination 12/20/2011  . Type 2 diabetes mellitus ([place]) 09/11/2006    Orientation RESPIRATION BLADDER Height & Weight     Self, Time, Situation, Place  Normal Continent Weight: (!) 300 lb 14.9 oz (136.5 kg) Height:  5\' 3"  (160 cm)  BEHAVIORAL SYMPTOMS/MOOD NEUROLOGICAL BOWEL NUTRITION STATUS      Continent Diet (Carb modified)  AMBULATORY STATUS COMMUNICATION OF NEEDS Skin   Extensive Assist Verbally Normal                       Personal Care Assistance Level of Assistance  Bathing Bathing Assistance: Maximum assistance   Dressing Assistance: Maximum assistance     Functional Limitations Info             SPECIAL CARE FACTORS FREQUENCY                       Contractures      Additional Factors Info  Allergies   Allergies Info:  (Vancomycin)            Current Medications (04/12/2015):  This is the current hospital active medication list Current Facility-Administered Medications  Medication Dose Route Frequency Provider Last Rate Last Dose  . 0.9 %  sodium chloride infusion  1,000 mL Intravenous Continuous Noemi Chapel, MD 125 mL/hr at 04/12/15 0600 1,000 mL at 04/12/15 0600  . aspirin chewable tablet 81 mg  81 mg Oral Daily Sinda Du, MD   81 mg at 04/12/15 1012  . ceFEPIme (MAXIPIME) 1 g in dextrose 5 % 50 mL IVPB  1 g Intravenous Q8H Sinda Du, MD   1 g at 04/12/15 0445  . celecoxib (CELEBREX) capsule 200 mg  200 mg Oral Daily Orvan Falconer, MD   200 mg at 04/12/15 1013  . citalopram (CELEXA) tablet 20 mg  20 mg Oral Daily Sinda Du, MD   20 mg at 04/12/15 1012  . clindamycin (CLEOCIN) IVPB 900 mg  900 mg Intravenous Q8H Sinda Du, MD   900 mg at 04/12/15 0559  . glipiZIDE (GLUCOTROL) tablet 5 mg  5 mg Oral BID AC Orvan Falconer, MD   5 mg at 04/12/15 [id number]   And  . metFORMIN (GLUCOPHAGE) tablet 500 mg  500 mg Oral BID AC Orvan Falconer, MD   500 mg at 04/12/15 0804  . insulin aspart (novoLOG) injection 0-20  Units  0-20 Units Subcutaneous TID WC Sinda Du, MD   0 Units at 04/10/15 1200  . insulin aspart (novoLOG) injection 0-5 Units  0-5 Units Subcutaneous QHS Sinda Du, MD   0 Units at 04/10/15 2200  . multivitamin with minerals tablet 1 tablet  1 tablet Oral Daily Orvan Falconer, MD   1 tablet at 04/12/15 1012  . rivaroxaban (XARELTO) tablet 20 mg  20 mg Oral Q supper Orvan Falconer, MD   20 mg at 04/11/15 1752  . senna (SENOKOT) tablet 8.6 mg  1 tablet Oral Daily PRN Willia Craze, NP      . trandolapril (MAVIK) tablet 4 mg  4 mg Oral Daily Orvan Falconer, MD   4 mg at 04/12/15 1012  . verapamil (CALAN-SR) CR tablet 240 mg  240 mg Oral Daily Orvan Falconer, MD   240 mg at 04/12/15 1012     Discharge Medications: Please see discharge summary for a list of discharge medications.  Relevant Imaging Results:  Relevant Lab  Results:   Additional Information    Darik Massing, Clydene Pugh, LCSW

## 2015-04-12 NOTE — Plan of Care (Signed)
Problem: Acute Rehab PT Goals(only PT should resolve) Goal: Pt Will Go Supine/Side To Sit Pt will demonstrate ModA bed mobility supine to/from sitting edge-of-bed to return to PLOF and to decrease caregiver burden.     Goal: Patient Will Transfer Sit To/From Stand Pt will transfer sit to/from-stand with RW at [facility] [place] +2 to demonstrate good safety awareness for independent mobility in home.

## 2015-04-12 NOTE — Progress Notes (Signed)
Present with patient for spiritual and emotional support. Her sister died suddenly last week and her funeral was today. Her son was present with her and they shared about the service. There are still nine siblings in her family and she shared about who her sister was to her. Will follow up for grief support.

## 2015-04-12 NOTE — Progress Notes (Signed)
Subjective: She says she feels okay. She is still depressed. She still has trouble with the wounds on her leg. We do not have identification of the organism in her bloodstream yet.  Objective: Vital signs in last 24 hours: Temp:  [98.3 F (36.8 C)-98.6 F (37 C)] 98.5 F (36.9 C) (02/20 0659) Pulse Rate:  [78-82] 81 (02/20 0659) Resp:  [15-20] 15 (02/20 0659) BP: (161-193)/(42-64) 161/42 mmHg (02/20 0659) SpO2:  [96 %-100 %] 100 % (02/20 0659) Weight change:  Last BM Date: 04/11/15  Intake/Output from previous day: 02/19 0701 - 02/20 0700 In: 3812.9 [P.O.:840; I.V.:2972.9] Out: 1900 [Urine:1900]  PHYSICAL EXAM General appearance: alert, cooperative, mild distress and morbidly obese Resp: clear to auscultation bilaterally Cardio: regular rate and rhythm, S1, S2 normal, no murmur, click, rub or gallop GI: soft, non-tender; bowel sounds normal; no masses,  no organomegaly Extremities: She has venous stasis chronic ulcerations and still some cellulitis  Lab Results:  Results for orders placed or performed during the hospital encounter of 04/09/15 (from the past 48 hour(s))  Glucose, capillary     Status: None   Collection Time: 04/10/15 10:13 AM  Result Value Ref Range   Glucose-Capillary 68 65 - 99 mg/dL   Comment 1 Notify RN   Glucose, capillary     Status: None   Collection Time: 04/10/15 11:57 AM  Result Value Ref Range   Glucose-Capillary 99 65 - 99 mg/dL   Comment 1 Notify RN   Glucose, capillary     Status: Abnormal   Collection Time: 04/10/15  4:35 PM  Result Value Ref Range   Glucose-Capillary 63 (L) 65 - 99 mg/dL   Comment 1 Notify RN   Glucose, capillary     Status: Abnormal   Collection Time: 04/10/15  6:28 PM  Result Value Ref Range   Glucose-Capillary 115 (H) 65 - 99 mg/dL   Comment 1 Notify RN   Glucose, capillary     Status: None   Collection Time: 04/10/15  8:14 PM  Result Value Ref Range   Glucose-Capillary 97 65 - 99 mg/dL   Comment 1 Notify RN     Comment 2 Document in Chart   Basic metabolic panel     Status: Abnormal   Collection Time: 04/11/15  6:52 AM  Result Value Ref Range   Sodium 143 135 - 145 mmol/L   Potassium 4.0 3.5 - 5.1 mmol/L   Chloride 113 (H) 101 - 111 mmol/L   CO2 23 22 - 32 mmol/L   Glucose, Bld 80 65 - 99 mg/dL   BUN 14 6 - 20 mg/dL   Creatinine, Ser 1.11 (H) 0.44 - 1.00 mg/dL   Calcium 8.4 (L) 8.9 - 10.3 mg/dL   GFR calc non Af Amer 51 (L) >60 mL/min   GFR calc Af Amer 59 (L) >60 mL/min    Comment: (NOTE) The eGFR has been calculated using the CKD EPI equation. This calculation has not been validated in all clinical situations. eGFR's persistently <60 mL/min signify possible Chronic Kidney Disease.    Anion gap 7 5 - 15  Glucose, capillary     Status: None   Collection Time: 04/11/15  7:56 AM  Result Value Ref Range   Glucose-Capillary 73 65 - 99 mg/dL   Comment 1 Notify RN   Glucose, capillary     Status: Abnormal   Collection Time: 04/11/15 11:18 AM  Result Value Ref Range   Glucose-Capillary 50 (L) 65 - 99 mg/dL  Comment 1 Notify RN   Glucose, capillary     Status: None   Collection Time: 04/11/15 12:54 PM  Result Value Ref Range   Glucose-Capillary 87 65 - 99 mg/dL  Glucose, capillary     Status: None   Collection Time: 04/11/15  4:03 PM  Result Value Ref Range   Glucose-Capillary 98 65 - 99 mg/dL   Comment 1 Notify RN   Glucose, capillary     Status: None   Collection Time: 04/11/15 10:00 PM  Result Value Ref Range   Glucose-Capillary 90 65 - 99 mg/dL    ABGS No results for input(s): PHART, PO2ART, TCO2, HCO3 in the last 72 hours.  Invalid input(s): PCO2 CULTURES Recent Results (from the past 240 hour(s))  Blood Culture (routine x 2)     Status: None (Preliminary result)   Collection Time: 04/09/15 12:41 PM  Result Value Ref Range Status   Specimen Description BLOOD DRAWN BY IV THERAPY  Final   Special Requests BOTTLES DRAWN AEROBIC AND ANAEROBIC 10 CC EACH  Final   Culture  NO GROWTH 2 DAYS  Final   Report Status PENDING  Incomplete  Urine culture     Status: None (Preliminary result)   Collection Time: 04/09/15 12:42 PM  Result Value Ref Range Status   Specimen Description URINE, CATHETERIZED  Final   Special Requests NONE  Final   Culture   Final    20,000 COLONIES/mL ESCHERICHIA COLI Performed at [facility]    Report Status PENDING  Incomplete  Blood Culture (routine x 2)     Status: None (Preliminary result)   Collection Time: 04/09/15 12:48 PM  Result Value Ref Range Status   Specimen Description BLOOD LEFT ARM  Final   Special Requests BOTTLES DRAWN AEROBIC AND ANAEROBIC 8 CC EACH  Final   Culture  Setup Time   Final    GRAM POSITIVE COCCI IN CLUSTERS ANAEROBIC BOTTLE ONLY CRITICAL RESULT CALLED TO, READ BACK BY AND VERIFIED WITH: K. SANTOS RN [phone] [place]    Culture   Final    TOO YOUNG TO READ Performed at [facility]    Report Status PENDING  Incomplete   Studies/Results: No results found.  Medications:  Prior to Admission:  Prescriptions prior to admission  Medication Sig Dispense Refill Last Dose  . CELEBREX 200 MG capsule Take 200 mg by mouth daily.    04/08/2015 at Unknown time  . Control Gel Formula Dressing (DUODERM CGF DRESSING EX) Apply 1 patch topically daily. APPLIED TO AFFECTED BUTTOCKS AREA   04/08/2015 at Unknown time  . glipiZIDE-metformin (METAGLIP) 5-500 MG tablet Take 1 tablet by mouth 2 (two) times daily before a meal.   04/09/2015 at Unknown time  . Multiple Vitamin (MULTIVITAMIN WITH MINERALS) TABS tablet Take 1 tablet by mouth daily. *CENTRUM SILVER   04/08/2015 at Unknown time  . oxyCODONE-acetaminophen (PERCOCET/ROXICET) 5-325 MG per tablet Take 1 tablet by mouth 4 (four) times daily.    04/08/2015 at Unknown time  . trandolapril (MAVIK) 4 MG tablet Take 4 mg by mouth daily.    04/08/2015 at Unknown time  . verapamil (CALAN-SR) 240 MG CR tablet Take 240 mg by mouth  daily.    04/08/2015 at Unknown time  . XARELTO 20 MG TABS Take 20 mg by mouth daily with supper.    04/08/2015 at 1800   Scheduled: . aspirin  81 mg Oral Daily  . ceFEPime (MAXIPIME) IV  1 g Intravenous Q8H  . celecoxib  200 mg Oral Daily  . citalopram  20 mg Oral Daily  . clindamycin (CLEOCIN) IV  900 mg Intravenous Q8H  . glipiZIDE  5 mg Oral BID AC   And  . metFORMIN  500 mg Oral BID AC  . insulin aspart  0-20 Units Subcutaneous TID WC  . insulin aspart  0-5 Units Subcutaneous QHS  . multivitamin with minerals  1 tablet Oral Daily  . rivaroxaban  20 mg Oral Q supper  . trandolapril  4 mg Oral Daily  . verapamil  240 mg Oral Daily   Continuous: . sodium chloride 1,000 mL (04/12/15 0600)   LHT:DSKAJ  Assesment: She was admitted with cellulitis and abscess of her leg, transient ischemic attack and she has a positive blood culture. There is a diagnosis listed of conversion aphonia but I'm not sure that is accurate. At baseline she has morbid obesity, diabetes mellitus type 2 and she's had pretty good control of that and she has had DVT and pulmonary embolism. She is morbidly obese and very immobile at home and has been on chronic anticoagulation because of that Principal Problem:   Aphasia Active Problems:   Type 2 diabetes mellitus (HCC)   Morbid obesity ([place])   TIA (transient ischemic attack)   Cellulitis and abscess of leg   Conversion aphonia    Plan: PT consultation. Wound ostomy consultation. Social work [place]. Continue current treatments.    LOS: 2 days   Keyasha Miah L 04/12/2015, 8:28 AM

## 2015-04-12 NOTE — Consult Note (Signed)
WOC wound consult note Reason for Consult:Chronic lymphedema to bilateral lower extremities.  Does not wear compression, appears stable at this time.  Chronic skin changes to left upper thigh, has foley catheter in place and states the tubing rubs this area frequently.  States she had a similar dry plaque like this one that resolved on the right thigh once before.  Intertriginous Dermatitis to left abdominal pannus Blistering to buttocks from med reaction, per patient.  She has used a back scratcher to the area and has resulting nonintact skin.  Wound type: intertriginous dermatitis to abdominal skin fold, left side pannus Pressure Ulcer POA: Yes Measurement: Left abdominal pannus 1 cm x 1 cm x 0.1 cm denuded skin within abdominal skin fold.  States just developed this a few days ago. 8 cm x 8 cm raised crusted plaque to left upper thigh.  Scattered nonintact lesions to buttocks from back scratcher trauma, s/p med reaction.  Wound [id number] and moist Drainage (amount, consistency, odor) Scant serous  No odor Periwound:Intact Dressing procedure/placement/frequency:Cleanse buttocks lesions with soap and water and pat gently dry.  Apply barrier cream and keep skin clean and dry.   Cleanse abdominal skin fold with soap and water and pat gently dry.  Therapeutic linen (Dermatherapy pillowcase) to skin fold to wick moisture away. Change out daily.   Amlactin lotion to dry cracked skin on left upper thigh. Apply twice daily. Will not follow at this time.  Please re-consult if needed.  Domenic Moras RN BSN [place] Pager [phone]

## 2015-04-13 DIAGNOSIS — R491 Aphonia: Secondary | ICD-10-CM | POA: Diagnosis not present

## 2015-04-13 DIAGNOSIS — Z86711 Personal history of pulmonary embolism: Secondary | ICD-10-CM | POA: Diagnosis not present

## 2015-04-13 DIAGNOSIS — E119 Type 2 diabetes mellitus without complications: Secondary | ICD-10-CM | POA: Diagnosis not present

## 2015-04-13 DIAGNOSIS — I10 Essential (primary) hypertension: Secondary | ICD-10-CM | POA: Diagnosis not present

## 2015-04-13 DIAGNOSIS — F339 Major depressive disorder, recurrent, unspecified: Secondary | ICD-10-CM | POA: Diagnosis not present

## 2015-04-13 DIAGNOSIS — L8942 Pressure ulcer of contiguous site of back, buttock and hip, stage 2: Secondary | ICD-10-CM | POA: Diagnosis not present

## 2015-04-13 DIAGNOSIS — L03119 Cellulitis of unspecified part of limb: Secondary | ICD-10-CM | POA: Diagnosis not present

## 2015-04-13 DIAGNOSIS — L97112 Non-pressure chronic ulcer of right thigh with fat layer exposed: Secondary | ICD-10-CM | POA: Diagnosis not present

## 2015-04-13 DIAGNOSIS — M6281 Muscle weakness (generalized): Secondary | ICD-10-CM | POA: Diagnosis not present

## 2015-04-13 DIAGNOSIS — L03115 Cellulitis of right lower limb: Secondary | ICD-10-CM | POA: Diagnosis not present

## 2015-04-13 DIAGNOSIS — R4701 Aphasia: Secondary | ICD-10-CM | POA: Diagnosis not present

## 2015-04-13 DIAGNOSIS — R279 Unspecified lack of coordination: Secondary | ICD-10-CM | POA: Diagnosis not present

## 2015-04-13 DIAGNOSIS — Z7401 Bed confinement status: Secondary | ICD-10-CM | POA: Diagnosis not present

## 2015-04-13 DIAGNOSIS — E11622 Type 2 diabetes mellitus with other skin ulcer: Secondary | ICD-10-CM | POA: Diagnosis not present

## 2015-04-13 DIAGNOSIS — G459 Transient cerebral ischemic attack, unspecified: Secondary | ICD-10-CM | POA: Diagnosis not present

## 2015-04-13 DIAGNOSIS — L02419 Cutaneous abscess of limb, unspecified: Secondary | ICD-10-CM | POA: Diagnosis not present

## 2015-04-13 LAB — GLUCOSE, CAPILLARY
GLUCOSE-CAPILLARY: 79 mg/dL (ref 65–99)
Glucose-Capillary: 80 mg/dL (ref 65–99)
Glucose-Capillary: 67 mg/dL (ref 65–99)

## 2015-04-13 LAB — CULTURE, BLOOD (ROUTINE X 2)

## 2015-04-13 MED ORDER — AMMONIUM LACTATE 12 % EX LOTN: TOPICAL_LOTION | Freq: Two times a day (BID) | CUTANEOUS | Status: AC

## 2015-04-13 MED ORDER — SULFAMETHOXAZOLE-TRIMETHOPRIM 800-160 MG PO TABS: 1.0000 | ORAL_TABLET | Freq: Two times a day (BID) | ORAL | Status: DC

## 2015-04-13 MED ORDER — SENNA 8.6 MG PO TABS: 1.0000 | ORAL_TABLET | Freq: Every day | ORAL | Status: DC | PRN

## 2015-04-13 MED ORDER — CITALOPRAM HYDROBROMIDE 20 MG PO TABS: 20.0000 mg | ORAL_TABLET | Freq: Every day | ORAL | Status: DC

## 2015-04-13 MED ORDER — LISINOPRIL 20 MG PO TABS: 20.0000 mg | ORAL_TABLET | Freq: Every day | ORAL | Status: DC

## 2015-04-13 MED ORDER — ASPIRIN 81 MG PO CHEW: 81.0000 mg | CHEWABLE_TABLET | Freq: Every day | ORAL | Status: DC

## 2015-04-13 NOTE — Progress Notes (Signed)
She feels better. She has no new complaints. The organism in her bloodstream is coagulase-negative staph and since this is in a single blood culture I think it's probably a contaminant. She has Escherichia coli in her urine. It has been suggested that she go to skilled care facility and I think that's appropriate. I will arrange for her discharge if she can get a bed today

## 2015-04-13 NOTE — Clinical Social Work Note (Signed)
CSW facilitated discharge.   CSW notified Debbie at [place] that patient was being discharged.  Patient advised that she had spoke to her sisters [name] and [name] about her discharge and placement.    CSW arranged [facility] transport.  CSW signing off.  Deontae Robson, Clydene Pugh, LCSW

## 2015-04-13 NOTE — Discharge Summary (Signed)
Physician Discharge Summary  Patient ID: [name] MRN: [id number] DOB/AGE: 28-Nov-1950 65 y.o. Primary Care Physician:Stanley Helmuth L, MD Admit date: 04/09/2015 Discharge date: 04/13/2015    Discharge Diagnoses:   Principal Problem:   Aphasia Active Problems:   Type 2 diabetes mellitus (HCC)   Essential hypertension, benign   Morbid obesity ([place])   TIA (transient ischemic attack)   Cellulitis and abscess of leg   Conversion aphonia   Depression     Medication List    STOP taking these medications        trandolapril 4 MG tablet  Commonly known as:  MAVIK      TAKE these medications        ammonium lactate 12 % lotion  Commonly known as:  LAC-HYDRIN  Apply topically 2 (two) times daily.     aspirin 81 MG chewable tablet  Chew 1 tablet (81 mg total) by mouth daily.     CELEBREX 200 MG capsule  Generic drug:  celecoxib  Take 200 mg by mouth daily.     citalopram 20 MG tablet  Commonly known as:  CELEXA  Take 1 tablet (20 mg total) by mouth daily.     DUODERM CGF DRESSING EX  Apply 1 patch topically daily. APPLIED TO AFFECTED BUTTOCKS AREA     glipiZIDE-metformin 5-500 MG tablet  Commonly known as:  METAGLIP  Take 1 tablet by mouth 2 (two) times daily before a meal.     lisinopril 20 MG tablet  Commonly known as:  PRINIVIL,ZESTRIL  Take 1 tablet (20 mg total) by mouth daily.     multivitamin with minerals Tabs tablet  Take 1 tablet by mouth daily. *CENTRUM SILVER     oxyCODONE-acetaminophen 5-325 MG tablet  Commonly known as:  PERCOCET/ROXICET  Take 1 tablet by mouth 4 (four) times daily.     senna 8.6 MG Tabs tablet  Commonly known as:  SENOKOT  Take 1 tablet (8.6 mg total) by mouth daily as needed for mild constipation.     sulfamethoxazole-trimethoprim 800-160 MG tablet  Commonly known as:  BACTRIM DS,SEPTRA DS  Take 1 tablet by mouth 2 (two) times daily.     verapamil 240 MG CR tablet  Commonly known as:  CALAN-SR  Take 240 mg by mouth  daily.     XARELTO 20 MG Tabs tablet  Generic drug:  rivaroxaban  Take 20 mg by mouth daily with supper.        Discharged Condition: Improved    Consults: None  Significant Diagnostic Studies: Ct Head Wo Contrast  04/09/2015  CLINICAL DATA:  65 year old female with altered mental status EXAM: CT HEAD WITHOUT CONTRAST TECHNIQUE: Contiguous axial images were obtained from the base of the skull through the vertex without intravenous contrast. COMPARISON:  None. FINDINGS: Negative for acute intracranial hemorrhage, acute infarction, mass, mass effect, hydrocephalus or midline shift. Gray-white differentiation is preserved throughout. Focal circumscribed hypoattenuation in the left pons likely represents a remote lacunar infarct. Mild periventricular white matter hypoattenuation consistent with chronic microvascular ischemic white matter disease. No focal scalp or calvarial abnormality. Globes and orbits are intact and symmetric. Normal aeration of the mastoid air cells and maxillary sinuses. Atherosclerotic calcification in the bilateral cavernous carotid arteries. IMPRESSION: 1. No acute intracranial abnormality. 2. 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 3. 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 4. Intracranial atherosclerosis. Electronically Signed   By: Jacqulynn Cadet M.D.   On: 04/09/2015 13:50   US Carotid Bilateral  04/09/2015  CLINICAL DATA:  Transient ischemic attack. EXAM: BILATERAL CAROTID DUPLEX ULTRASOUND TECHNIQUE: Pearline Cables scale imaging, color Doppler and duplex ultrasound were performed of bilateral carotid and vertebral arteries in the neck. COMPARISON:  None. FINDINGS: Criteria: Quantification of carotid stenosis is based on velocity parameters that correlate the residual internal carotid diameter with NASCET-based stenosis levels, using the diameter of the distal internal carotid lumen as the denominator for stenosis measurement. The following velocity  measurements were obtained: RIGHT ICA:  109/32 cm/sec CCA:  [id number] cm/sec SYSTOLIC ICA/CCA RATIO:  [id number] DIASTOLIC ICA/CCA RATIO:  [id number] ECA:  101 cm/sec LEFT ICA:  132/30 cm/sec CCA:  [id number] cm/sec SYSTOLIC ICA/CCA RATIO:  [id number] DIASTOLIC ICA/CCA RATIO:  [id number] ECA:  90 cm/sec RIGHT CAROTID ARTERY: Mild plaque formation is noted in the right carotid bulb and proximal right internal carotid artery consistent with less than 50% diameter stenosis based on Doppler and ultrasound criteria. RIGHT VERTEBRAL ARTERY:  Antegrade flow is noted. LEFT CAROTID ARTERY: Minimal to mild plaque formation is noted in the left carotid bulb and proximal left internal carotid artery consistent with less than 50% diameter stenosis based on ultrasound imaging. However, focal elevated velocities are noted in the midportion of the left internal carotid artery, but no significant plaque formation is seen in this area. LEFT VERTEBRAL ARTERY:  Antegrade flow is noted. IMPRESSION: Mild plaque formation is noted in the right carotid bulb and proximal right internal carotid artery consistent with less than 50% diameter stenosis based on ultrasound and Doppler criteria. Minimal to mild plaque formation is noted in the left carotid bulb and proximal left internal carotid artery which is consistent with less than 50% diameter stenosis based on ultrasound and Doppler criteria. However, the possibility of atheromatous ulcer formation which may be a source of emboli cannot be excluded on the basis of this exam. Electronically Signed   By: Marijo Conception, M.D.   On: 04/09/2015 17:18   Dg Chest Port 1 View  04/09/2015  CLINICAL DATA:  65 year old female with altered mental status EXAM: PORTABLE CHEST 1 VIEW COMPARISON:  Prior chest x-ray 12/25/2014 FINDINGS: Extremely low inspiratory volumes with mild bibasilar atelectasis. Low inspiratory volumes results in crowding of the pulmonary vasculature. No definite pulmonary edema. Cardiac and mediastinal  contours remain within normal limits. Atherosclerotic calcifications in the transverse aorta. No focal airspace consolidation or pneumothorax. No acute osseous abnormality. IMPRESSION: Very low inspiratory volumes with mild bibasilar atelectasis. Electronically Signed   By: Jacqulynn Cadet M.D.   On: 04/09/2015 12:40    Lab Results: Basic Metabolic Panel:  Recent Labs  04/11/15 0652  NA 143  K 4.0  CL 113*  CO2 23  GLUCOSE 80  BUN 14  CREATININE 1.11*  CALCIUM 8.4*   Liver Function Tests: No results for input(s): AST, ALT, ALKPHOS, BILITOT, PROT, ALBUMIN in the last 72 hours.   CBC: No results for input(s): WBC, NEUTROABS, HGB, HCT, MCV, PLT in the last 72 hours.  Recent Results (from the past 240 hour(s))  Blood Culture (routine x 2)     Status: None (Preliminary result)   Collection Time: 04/09/15 12:41 PM  Result Value Ref Range Status   Specimen Description BLOOD DRAWN BY IV THERAPY  Final   Special Requests BOTTLES DRAWN AEROBIC AND ANAEROBIC 10 CC EACH  Final   Culture NO GROWTH 3 DAYS  Final   Report Status PENDING  Incomplete  Urine culture     Status: None   Collection Time: 04/09/15 12:42  PM  Result Value Ref Range Status   Specimen Description URINE, CATHETERIZED  Final   Special Requests NONE  Final   Culture   Final    40,000 COLONIES/ml ESCHERICHIA COLI Performed at [facility]    Report Status 04/12/2015 FINAL  Final   Organism ID, Bacteria ESCHERICHIA COLI  Final      Susceptibility   Escherichia coli - MIC*    AMPICILLIN 8 SENSITIVE Sensitive     CEFAZOLIN <=4 SENSITIVE Sensitive     CEFTRIAXONE <=1 SENSITIVE Sensitive     CIPROFLOXACIN <=0.25 SENSITIVE Sensitive     GENTAMICIN <=1 SENSITIVE Sensitive     IMIPENEM <=0.25 SENSITIVE Sensitive     NITROFURANTOIN <=16 SENSITIVE Sensitive     TRIMETH/SULFA <=20 SENSITIVE Sensitive     AMPICILLIN/SULBACTAM 8 SENSITIVE Sensitive     PIP/TAZO <=4 SENSITIVE Sensitive     * 40,000 COLONIES/ml  ESCHERICHIA COLI  Blood Culture (routine x 2)     Status: None (Preliminary result)   Collection Time: 04/09/15 12:48 PM  Result Value Ref Range Status   Specimen Description BLOOD LEFT ARM  Final   Special Requests BOTTLES DRAWN AEROBIC AND ANAEROBIC 8 CC EACH  Final   Culture  Setup Time   Final    GRAM POSITIVE COCCI IN CLUSTERS ANAEROBIC BOTTLE ONLY CRITICAL RESULT CALLED TO, READ BACK BY AND VERIFIED WITH: K. SANTOS RN [phone] [place]    Culture   Final    STAPHYLOCOCCUS SPECIES (COAGULASE NEGATIVE) THE SIGNIFICANCE OF ISOLATING THIS ORGANISM FROM A SINGLE SET OF BLOOD CULTURES WHEN MULTIPLE SETS ARE DRAWN IS UNCERTAIN. PLEASE NOTIFY THE MICROBIOLOGY DEPARTMENT WITHIN ONE WEEK IF SPECIATION AND SENSITIVITIES ARE REQUIRED. Performed at [facility] Course: This is a 65 year old who came to the emergency department with increased problems with unable to speak. She has been bedridden at home. Her symptoms had resolved by the time she came to the emergency department. She was thought to have TIA. Workup was negative for secondary cause. Her blood pressure was somewhat elevated throughout her hospitalization and her blood pressure medications were modified. She was on sliding scale insulin. She had a positive blood culture but this was coagulase negative Staphylococcus and was thought to be a contaminant. She had wound and ostomy consultation because of cellulitis and she is improving. She is pretty much bedbound and it was recommended that she has skilled care facility placement which is being arranged  Discharge Exam: Blood pressure 159/78, pulse 85, temperature 98.2 F (36.8 C), temperature source Oral, resp. rate 20, height 5\' 3"  (1.6 m), weight 136.5 kg (300 lb 14.9 oz), SpO2 100 %. She is awake and alert. She is morbidly obese. Her chest is clear. Heart is regular. Her abdomen is soft. She is  cellulitis for thigh  Disposition: To skilled care facility for rehabilitation      Signed: Ludene Stokke L   04/13/2015, 8:48 AM

## 2015-04-13 NOTE — Progress Notes (Signed)
Inpatient Diabetes Program Recommendations  AACE/ADA: New Consensus Statement on Inpatient Glycemic Control (2015)  Target Ranges:  Prepandial:   less than 140 mg/dL      Peak postprandial:   less than 180 mg/dL (1-2 hours)      Critically ill patients:  140 - 180 mg/dL  Results for Rehm, [name] (MRN [id number]) as of 04/13/2015 08:55  Ref. Range 04/12/2015 07:43 04/12/2015 12:09 04/12/2015 14:37 04/12/2015 17:05 04/12/2015 21:38 04/13/2015 00:37 04/13/2015 07:15  Glucose-Capillary Latest Ref Range: 65-99 mg/dL 96 59 (L) 119 (H) 102 (H) 54 (L) 79 80   Review of Glycemic Control  Diabetes history: DM2 Outpatient Diabetes medications: MetaGlip 5-500 mg BID Current orders for Inpatient glycemic control: Glipizide 5 mg BID, Metformin 500 mg BID, Novolog 0-20 units TID with meals, Novolog 0-5 units HS  Inpatient Diabetes Program Recommendations: Oral Agents: Glucose ranged from 54-102 mg/dl on 04/12/15 and fasting glucose is 80 mg/dl this morning. May want to consider decreasing or discontinuing Glipizide while inpatient.  Thanks, Barnie Alderman, RN, MSN, CDE Diabetes Coordinator Inpatient Diabetes Program [phone] (Team Pager from [place] to [place]) [phone] ([facility] office) [phone] [facility] office) [phone] [facility] office)

## 2015-04-13 NOTE — Clinical Social Work Note (Signed)
Clinical Social Work Assessment  Patient Details  Name: [name] MRN: [id number] Date of Birth: 22-Jun-1950  Date of referral:  04/13/15               Reason for consult:  Facility Placement                Permission sought to share information with:    Permission granted to share information::     Name::        Agency::     Relationship::     Contact Information:     Housing/Transportation Living arrangements for the past 2 months:  Single Family Home Source of Information:  Patient Patient Interpreter Needed:  None Criminal Activity/Legal Involvement Pertinent to Current Situation/Hospitalization:  No - Comment as needed Significant Relationships:  Adult Children, Spouse, Siblings Lives with:  Spouse Do you feel safe going back to the place where you live?  Yes Need for family participation in patient care:  No (Coment)  Care giving concerns:  None identified.     Social Worker assessment / plan:  Patient stated that she resides with her husband.  She stated that at baseline she ambulates with a cane or walker.  She stated that she uses a wheelchair when going to appointments.  Patient stated that it is difficult for her to utilize the wheelchair in her home due to her home being too small to accommodate.  CSW discussed SNF. Patient was agreeable. CSW provided SNF list. CSW sent clinicals to [facility], [facility], [facility] and [facility] at patient's request.  Patient identified that her husband as her primary caretaker.  She stated that her son is also very supportive.    Employment status:  Disabled (Comment on whether or not currently receiving Disability) Insurance information:  Medicare PT Recommendations:  Not assessed at this time Information / Referral to community resources:     Patient/Family's Response to care: Patient is agreeable to SNF.   Patient/Family's Understanding of and Emotional Response to Diagnosis, Current Treatment, and Prognosis:  Patient understands her diagnosis,  treatment and prognosis.   Emotional Assessment Appearance:  Developmentally appropriate Attitude/Demeanor/Rapport:   (Cooperative and pleasant) Affect (typically observed):  Accepting Orientation:  Oriented to Situation, Oriented to  Time, Oriented to Place, Oriented to Self Alcohol / Substance use:  Not Applicable Psych involvement (Current and /or in the community):  No (Comment)  Discharge Needs  Concerns to be addressed:  Discharge Planning Concerns Readmission within the last 30 days:  No Current discharge risk:  None Barriers to Discharge:  No Barriers Identified   Ihor Gully, LCSW 04/13/2015, 10:58 AM

## 2015-04-13 NOTE — Care Management Note (Signed)
Case Management Note  Patient Details  Name: LATIMA COMMON MRN: [id number] Date of Birth: 02/07/1951  Subjective/Objective:                  Pt is from home, lives with husband and is ind at baseline. Pt receiving HH services through [facility] prior to admission. PT has seen pt and recommends SNF. Pt is agreeable. CSW is aware of DC plan and will work with pt to find placement.   Action/Plan: Pt discharging today. No CM needs.   Expected Discharge Date:     04/13/2015             Expected Discharge Plan:  Skilled Nursing Facility  In-House Referral:  Clinical Social Work  Discharge planning Services  CM Consult  Post Acute Care Choice:  NA Choice offered to:  NA  DME Arranged:    DME Agency:     HH Arranged:    [place] Agency:     Status of Service:  Completed, signed off  Medicare Important Message Given:  Yes Date Medicare IM Given:    Medicare IM give by:    Date Additional Medicare IM Given:    Additional Medicare Important Message give by:     If discussed at [place] of Stay Meetings, dates discussed:    Additional Comments:  Sherald Barge, RN 04/13/2015, 1:03 PM

## 2015-04-13 NOTE — Progress Notes (Signed)
Patient discharged to [facility], report called to [facility] LPN discharge packet sent by [facility]. Patient transported via EMS no c/o pain or shortness of breath at d/c.  Shaheed Schmuck [employer]

## 2015-04-13 NOTE — Clinical Social Work Placement (Signed)
   CLINICAL SOCIAL WORK PLACEMENT  NOTE  Date:  04/13/2015  Patient Details  Name: [name] MRN: [id number] Date of Birth: 09-22-1950  Clinical Social Work is seeking post-discharge placement for this patient at the [place] level of care (*CSW will initial, date and re-position this form in  chart as items are completed):  Yes   Patient/family provided with [place] Work Department's list of facilities offering this level of care within the geographic area requested by the patient (or if unable, by the patient's family).  Yes   Patient/family informed of their freedom to choose among providers that offer the needed level of care, that participate in Medicare, Medicaid or managed care program needed by the patient, have an available bed and are willing to accept the patient.  Yes   Patient/family informed of [facility]'s ownership interest in [facility] and [facility], as well as of the fact that they are under no obligation to receive care at these facilities.  PASRR submitted to EDS on 04/12/15     PASRR number received on 04/12/15     Existing PASRR number confirmed on       FL2 transmitted to all facilities in geographic area requested by pt/family on 04/12/15     FL2 transmitted to all facilities within larger geographic area on       Patient informed that his/her managed care company has contracts with or will negotiate with certain facilities, including the following:        Yes   Patient/family informed of bed offers received.  Patient chooses bed at [place] [facility]     Physician recommends and patient chooses bed at      Patient to be transferred to [facility] [place] on 04/13/15.  Patient to be transferred to facility by  [facility]     Patient family notified on 04/13/15 of transfer.  Name of family member notified:   (Patient stated that she notified her sisters [name] and [name] of her discharge and placement.  She stated that she was unable to get in contact with her husband.  She state that  she would continue callling him.)     PHYSICIAN       Additional Comment:    _______________________________________________ Ihor Gully, LCSW 04/13/2015, 12:45 PM

## 2015-04-13 NOTE — Care Management Important Message (Signed)
Important Message  Patient Details  Name: [name] MRN: [id number] Date of Birth: [birth year]   Medicare Important Message Given:  Yes    Sherald Barge, RN 04/13/2015, 1:02 PM

## 2015-04-14 LAB — CULTURE, BLOOD (ROUTINE X 2): Culture: NO GROWTH

## 2015-04-21 NOTE — Discharge Summary (Signed)
NAME:  [name], [name]                 ACCOUNT NO.:  [id number]  MEDICAL RECORD NO.:  [id number]  LOCATION:  A304                          FACILITY:  [facility]  PHYSICIAN:  Hicks Feick L. Luan Pulling, M.D.DATE OF BIRTH:  1950-11-05  DATE OF ADMISSION:  04/09/2015 DATE OF DISCHARGE:  02/21/2017LH                              DISCHARGE SUMMARY   ADDENDUM:  DISCHARGE DIAGNOSIS:  Sepsis ruled out.     Terrill Wauters L. Luan Pulling, M.D.     ELH/MEDQ  D:  04/21/2015  T:  04/21/2015  Job:  [id number]

## 2015-05-22 DIAGNOSIS — L89890 Pressure ulcer of other site, unstageable: Secondary | ICD-10-CM | POA: Diagnosis not present

## 2015-05-22 DIAGNOSIS — E46 Unspecified protein-calorie malnutrition: Secondary | ICD-10-CM | POA: Diagnosis not present

## 2015-05-22 DIAGNOSIS — D649 Anemia, unspecified: Secondary | ICD-10-CM | POA: Diagnosis not present

## 2015-05-22 DIAGNOSIS — L03119 Cellulitis of unspecified part of limb: Secondary | ICD-10-CM | POA: Diagnosis not present

## 2015-05-22 DIAGNOSIS — N183 Chronic kidney disease, stage 3 (moderate): Secondary | ICD-10-CM | POA: Diagnosis not present

## 2015-05-22 DIAGNOSIS — I10 Essential (primary) hypertension: Secondary | ICD-10-CM | POA: Diagnosis not present

## 2015-05-22 DIAGNOSIS — G459 Transient cerebral ischemic attack, unspecified: Secondary | ICD-10-CM | POA: Diagnosis not present

## 2015-05-22 DIAGNOSIS — M6281 Muscle weakness (generalized): Secondary | ICD-10-CM | POA: Diagnosis not present

## 2015-05-22 DIAGNOSIS — N390 Urinary tract infection, site not specified: Secondary | ICD-10-CM | POA: Diagnosis not present

## 2015-05-22 DIAGNOSIS — L97119 Non-pressure chronic ulcer of right thigh with unspecified severity: Secondary | ICD-10-CM | POA: Diagnosis not present

## 2015-05-22 DIAGNOSIS — L02419 Cutaneous abscess of limb, unspecified: Secondary | ICD-10-CM | POA: Diagnosis not present

## 2015-05-22 DIAGNOSIS — E649 Sequelae of unspecified nutritional deficiency: Secondary | ICD-10-CM | POA: Diagnosis not present

## 2015-05-22 DIAGNOSIS — R4701 Aphasia: Secondary | ICD-10-CM | POA: Diagnosis not present

## 2015-05-22 DIAGNOSIS — L89322 Pressure ulcer of left buttock, stage 2: Secondary | ICD-10-CM | POA: Diagnosis not present

## 2015-05-22 DIAGNOSIS — E119 Type 2 diabetes mellitus without complications: Secondary | ICD-10-CM | POA: Diagnosis not present

## 2015-05-22 DIAGNOSIS — R491 Aphonia: Secondary | ICD-10-CM | POA: Diagnosis not present

## 2015-05-22 DIAGNOSIS — Z86711 Personal history of pulmonary embolism: Secondary | ICD-10-CM | POA: Diagnosis not present

## 2015-06-02 DIAGNOSIS — L89322 Pressure ulcer of left buttock, stage 2: Secondary | ICD-10-CM | POA: Diagnosis not present

## 2015-06-15 DIAGNOSIS — L97119 Non-pressure chronic ulcer of right thigh with unspecified severity: Secondary | ICD-10-CM | POA: Diagnosis not present

## 2015-06-22 DIAGNOSIS — L97119 Non-pressure chronic ulcer of right thigh with unspecified severity: Secondary | ICD-10-CM | POA: Diagnosis not present

## 2015-06-29 DIAGNOSIS — L89890 Pressure ulcer of other site, unstageable: Secondary | ICD-10-CM | POA: Diagnosis not present

## 2015-06-29 DIAGNOSIS — L89322 Pressure ulcer of left buttock, stage 2: Secondary | ICD-10-CM | POA: Diagnosis not present

## 2015-07-06 DIAGNOSIS — L89890 Pressure ulcer of other site, unstageable: Secondary | ICD-10-CM | POA: Diagnosis not present

## 2015-07-06 DIAGNOSIS — L89322 Pressure ulcer of left buttock, stage 2: Secondary | ICD-10-CM | POA: Diagnosis not present

## 2015-07-13 DIAGNOSIS — L89322 Pressure ulcer of left buttock, stage 2: Secondary | ICD-10-CM | POA: Diagnosis not present

## 2015-07-13 DIAGNOSIS — L89890 Pressure ulcer of other site, unstageable: Secondary | ICD-10-CM | POA: Diagnosis not present

## 2015-07-19 DIAGNOSIS — N183 Chronic kidney disease, stage 3 (moderate): Secondary | ICD-10-CM | POA: Diagnosis not present

## 2015-07-19 DIAGNOSIS — E46 Unspecified protein-calorie malnutrition: Secondary | ICD-10-CM | POA: Diagnosis not present

## 2015-07-19 DIAGNOSIS — D649 Anemia, unspecified: Secondary | ICD-10-CM | POA: Diagnosis not present

## 2015-07-19 DIAGNOSIS — N390 Urinary tract infection, site not specified: Secondary | ICD-10-CM | POA: Diagnosis not present

## 2015-07-20 DIAGNOSIS — L89890 Pressure ulcer of other site, unstageable: Secondary | ICD-10-CM | POA: Diagnosis not present

## 2015-07-22 DIAGNOSIS — M6281 Muscle weakness (generalized): Secondary | ICD-10-CM | POA: Diagnosis not present

## 2015-07-22 DIAGNOSIS — L03119 Cellulitis of unspecified part of limb: Secondary | ICD-10-CM | POA: Diagnosis not present

## 2015-07-22 DIAGNOSIS — G459 Transient cerebral ischemic attack, unspecified: Secondary | ICD-10-CM | POA: Diagnosis not present

## 2015-07-22 DIAGNOSIS — I10 Essential (primary) hypertension: Secondary | ICD-10-CM | POA: Diagnosis not present

## 2015-07-23 DIAGNOSIS — G459 Transient cerebral ischemic attack, unspecified: Secondary | ICD-10-CM | POA: Diagnosis not present

## 2015-07-23 DIAGNOSIS — M6281 Muscle weakness (generalized): Secondary | ICD-10-CM | POA: Diagnosis not present

## 2015-07-26 DIAGNOSIS — M6281 Muscle weakness (generalized): Secondary | ICD-10-CM | POA: Diagnosis not present

## 2015-07-26 DIAGNOSIS — G459 Transient cerebral ischemic attack, unspecified: Secondary | ICD-10-CM | POA: Diagnosis not present

## 2015-07-27 DIAGNOSIS — G459 Transient cerebral ischemic attack, unspecified: Secondary | ICD-10-CM | POA: Diagnosis not present

## 2015-07-27 DIAGNOSIS — M6281 Muscle weakness (generalized): Secondary | ICD-10-CM | POA: Diagnosis not present

## 2015-07-28 DIAGNOSIS — M6281 Muscle weakness (generalized): Secondary | ICD-10-CM | POA: Diagnosis not present

## 2015-07-28 DIAGNOSIS — G459 Transient cerebral ischemic attack, unspecified: Secondary | ICD-10-CM | POA: Diagnosis not present

## 2015-07-28 DIAGNOSIS — L89322 Pressure ulcer of left buttock, stage 2: Secondary | ICD-10-CM | POA: Diagnosis not present

## 2015-07-29 DIAGNOSIS — M6281 Muscle weakness (generalized): Secondary | ICD-10-CM | POA: Diagnosis not present

## 2015-07-29 DIAGNOSIS — G8929 Other chronic pain: Secondary | ICD-10-CM | POA: Diagnosis not present

## 2015-07-29 DIAGNOSIS — L03119 Cellulitis of unspecified part of limb: Secondary | ICD-10-CM | POA: Diagnosis not present

## 2015-07-29 DIAGNOSIS — G459 Transient cerebral ischemic attack, unspecified: Secondary | ICD-10-CM | POA: Diagnosis not present

## 2015-07-30 DIAGNOSIS — G459 Transient cerebral ischemic attack, unspecified: Secondary | ICD-10-CM | POA: Diagnosis not present

## 2015-07-30 DIAGNOSIS — M6281 Muscle weakness (generalized): Secondary | ICD-10-CM | POA: Diagnosis not present

## 2015-08-02 DIAGNOSIS — G459 Transient cerebral ischemic attack, unspecified: Secondary | ICD-10-CM | POA: Diagnosis not present

## 2015-08-02 DIAGNOSIS — M6281 Muscle weakness (generalized): Secondary | ICD-10-CM | POA: Diagnosis not present

## 2015-08-03 DIAGNOSIS — G459 Transient cerebral ischemic attack, unspecified: Secondary | ICD-10-CM | POA: Diagnosis not present

## 2015-08-03 DIAGNOSIS — M6281 Muscle weakness (generalized): Secondary | ICD-10-CM | POA: Diagnosis not present

## 2015-08-04 DIAGNOSIS — G459 Transient cerebral ischemic attack, unspecified: Secondary | ICD-10-CM | POA: Diagnosis not present

## 2015-08-04 DIAGNOSIS — M6281 Muscle weakness (generalized): Secondary | ICD-10-CM | POA: Diagnosis not present

## 2015-08-05 DIAGNOSIS — G8929 Other chronic pain: Secondary | ICD-10-CM | POA: Diagnosis not present

## 2015-08-05 DIAGNOSIS — L03119 Cellulitis of unspecified part of limb: Secondary | ICD-10-CM | POA: Diagnosis not present

## 2015-08-05 DIAGNOSIS — G459 Transient cerebral ischemic attack, unspecified: Secondary | ICD-10-CM | POA: Diagnosis not present

## 2015-08-05 DIAGNOSIS — M6281 Muscle weakness (generalized): Secondary | ICD-10-CM | POA: Diagnosis not present

## 2015-08-06 DIAGNOSIS — L89890 Pressure ulcer of other site, unstageable: Secondary | ICD-10-CM | POA: Diagnosis not present

## 2015-08-06 DIAGNOSIS — M6281 Muscle weakness (generalized): Secondary | ICD-10-CM | POA: Diagnosis not present

## 2015-08-06 DIAGNOSIS — G459 Transient cerebral ischemic attack, unspecified: Secondary | ICD-10-CM | POA: Diagnosis not present

## 2015-08-09 DIAGNOSIS — M6281 Muscle weakness (generalized): Secondary | ICD-10-CM | POA: Diagnosis not present

## 2015-08-09 DIAGNOSIS — G459 Transient cerebral ischemic attack, unspecified: Secondary | ICD-10-CM | POA: Diagnosis not present

## 2015-08-10 DIAGNOSIS — G459 Transient cerebral ischemic attack, unspecified: Secondary | ICD-10-CM | POA: Diagnosis not present

## 2015-08-10 DIAGNOSIS — M6281 Muscle weakness (generalized): Secondary | ICD-10-CM | POA: Diagnosis not present

## 2015-08-11 DIAGNOSIS — M6281 Muscle weakness (generalized): Secondary | ICD-10-CM | POA: Diagnosis not present

## 2015-08-11 DIAGNOSIS — G459 Transient cerebral ischemic attack, unspecified: Secondary | ICD-10-CM | POA: Diagnosis not present

## 2015-08-12 DIAGNOSIS — L03119 Cellulitis of unspecified part of limb: Secondary | ICD-10-CM | POA: Diagnosis not present

## 2015-08-12 DIAGNOSIS — G459 Transient cerebral ischemic attack, unspecified: Secondary | ICD-10-CM | POA: Diagnosis not present

## 2015-08-12 DIAGNOSIS — Z86711 Personal history of pulmonary embolism: Secondary | ICD-10-CM | POA: Diagnosis not present

## 2015-08-12 DIAGNOSIS — M6281 Muscle weakness (generalized): Secondary | ICD-10-CM | POA: Diagnosis not present

## 2015-08-12 DIAGNOSIS — R52 Pain, unspecified: Secondary | ICD-10-CM | POA: Diagnosis not present

## 2015-08-12 DIAGNOSIS — D649 Anemia, unspecified: Secondary | ICD-10-CM | POA: Diagnosis not present

## 2015-08-12 DIAGNOSIS — S81809D Unspecified open wound, unspecified lower leg, subsequent encounter: Secondary | ICD-10-CM | POA: Diagnosis not present

## 2015-08-12 DIAGNOSIS — N183 Chronic kidney disease, stage 3 (moderate): Secondary | ICD-10-CM | POA: Diagnosis not present

## 2015-08-13 DIAGNOSIS — M6281 Muscle weakness (generalized): Secondary | ICD-10-CM | POA: Diagnosis not present

## 2015-08-13 DIAGNOSIS — L97919 Non-pressure chronic ulcer of unspecified part of right lower leg with unspecified severity: Secondary | ICD-10-CM | POA: Diagnosis not present

## 2015-08-13 DIAGNOSIS — G459 Transient cerebral ischemic attack, unspecified: Secondary | ICD-10-CM | POA: Diagnosis not present

## 2015-08-13 DIAGNOSIS — L89890 Pressure ulcer of other site, unstageable: Secondary | ICD-10-CM | POA: Diagnosis not present

## 2015-08-16 DIAGNOSIS — G459 Transient cerebral ischemic attack, unspecified: Secondary | ICD-10-CM | POA: Diagnosis not present

## 2015-08-16 DIAGNOSIS — M6281 Muscle weakness (generalized): Secondary | ICD-10-CM | POA: Diagnosis not present

## 2015-08-17 DIAGNOSIS — M6281 Muscle weakness (generalized): Secondary | ICD-10-CM | POA: Diagnosis not present

## 2015-08-17 DIAGNOSIS — G459 Transient cerebral ischemic attack, unspecified: Secondary | ICD-10-CM | POA: Diagnosis not present

## 2015-08-18 DIAGNOSIS — G459 Transient cerebral ischemic attack, unspecified: Secondary | ICD-10-CM | POA: Diagnosis not present

## 2015-08-18 DIAGNOSIS — M6281 Muscle weakness (generalized): Secondary | ICD-10-CM | POA: Diagnosis not present

## 2015-08-19 DIAGNOSIS — G459 Transient cerebral ischemic attack, unspecified: Secondary | ICD-10-CM | POA: Diagnosis not present

## 2015-08-19 DIAGNOSIS — L03119 Cellulitis of unspecified part of limb: Secondary | ICD-10-CM | POA: Diagnosis not present

## 2015-08-19 DIAGNOSIS — Z86711 Personal history of pulmonary embolism: Secondary | ICD-10-CM | POA: Diagnosis not present

## 2015-08-19 DIAGNOSIS — M6281 Muscle weakness (generalized): Secondary | ICD-10-CM | POA: Diagnosis not present

## 2015-08-20 DIAGNOSIS — R279 Unspecified lack of coordination: Secondary | ICD-10-CM | POA: Diagnosis not present

## 2015-08-20 DIAGNOSIS — Z7401 Bed confinement status: Secondary | ICD-10-CM | POA: Diagnosis not present

## 2015-08-22 DIAGNOSIS — L89892 Pressure ulcer of other site, stage 2: Secondary | ICD-10-CM | POA: Diagnosis not present

## 2015-08-22 DIAGNOSIS — E119 Type 2 diabetes mellitus without complications: Secondary | ICD-10-CM | POA: Diagnosis not present

## 2015-08-22 DIAGNOSIS — Z7401 Bed confinement status: Secondary | ICD-10-CM | POA: Diagnosis not present

## 2015-08-22 DIAGNOSIS — Z466 Encounter for fitting and adjustment of urinary device: Secondary | ICD-10-CM | POA: Diagnosis not present

## 2015-08-22 DIAGNOSIS — I6782 Cerebral ischemia: Secondary | ICD-10-CM | POA: Diagnosis not present

## 2015-08-22 DIAGNOSIS — Z7984 Long term (current) use of oral hypoglycemic drugs: Secondary | ICD-10-CM | POA: Diagnosis not present

## 2015-08-22 DIAGNOSIS — Z7901 Long term (current) use of anticoagulants: Secondary | ICD-10-CM | POA: Diagnosis not present

## 2015-08-22 DIAGNOSIS — I10 Essential (primary) hypertension: Secondary | ICD-10-CM | POA: Diagnosis not present

## 2015-08-22 DIAGNOSIS — Z4800 Encounter for change or removal of nonsurgical wound dressing: Secondary | ICD-10-CM | POA: Diagnosis not present

## 2015-08-22 DIAGNOSIS — J449 Chronic obstructive pulmonary disease, unspecified: Secondary | ICD-10-CM | POA: Diagnosis not present

## 2015-08-22 DIAGNOSIS — M6281 Muscle weakness (generalized): Secondary | ICD-10-CM | POA: Diagnosis not present

## 2015-08-25 DIAGNOSIS — I6782 Cerebral ischemia: Secondary | ICD-10-CM | POA: Diagnosis not present

## 2015-08-25 DIAGNOSIS — L89892 Pressure ulcer of other site, stage 2: Secondary | ICD-10-CM | POA: Diagnosis not present

## 2015-08-25 DIAGNOSIS — Z4800 Encounter for change or removal of nonsurgical wound dressing: Secondary | ICD-10-CM | POA: Diagnosis not present

## 2015-08-25 DIAGNOSIS — Z7901 Long term (current) use of anticoagulants: Secondary | ICD-10-CM | POA: Diagnosis not present

## 2015-08-25 DIAGNOSIS — J449 Chronic obstructive pulmonary disease, unspecified: Secondary | ICD-10-CM | POA: Diagnosis not present

## 2015-08-25 DIAGNOSIS — E119 Type 2 diabetes mellitus without complications: Secondary | ICD-10-CM | POA: Diagnosis not present

## 2015-08-25 DIAGNOSIS — M6281 Muscle weakness (generalized): Secondary | ICD-10-CM | POA: Diagnosis not present

## 2015-08-25 DIAGNOSIS — I10 Essential (primary) hypertension: Secondary | ICD-10-CM | POA: Diagnosis not present

## 2015-08-25 DIAGNOSIS — Z7401 Bed confinement status: Secondary | ICD-10-CM | POA: Diagnosis not present

## 2015-08-25 DIAGNOSIS — Z466 Encounter for fitting and adjustment of urinary device: Secondary | ICD-10-CM | POA: Diagnosis not present

## 2015-08-25 DIAGNOSIS — Z7984 Long term (current) use of oral hypoglycemic drugs: Secondary | ICD-10-CM | POA: Diagnosis not present

## 2015-08-26 DIAGNOSIS — Z7984 Long term (current) use of oral hypoglycemic drugs: Secondary | ICD-10-CM | POA: Diagnosis not present

## 2015-08-26 DIAGNOSIS — E119 Type 2 diabetes mellitus without complications: Secondary | ICD-10-CM | POA: Diagnosis not present

## 2015-08-26 DIAGNOSIS — I6782 Cerebral ischemia: Secondary | ICD-10-CM | POA: Diagnosis not present

## 2015-08-26 DIAGNOSIS — J449 Chronic obstructive pulmonary disease, unspecified: Secondary | ICD-10-CM | POA: Diagnosis not present

## 2015-08-26 DIAGNOSIS — L89892 Pressure ulcer of other site, stage 2: Secondary | ICD-10-CM | POA: Diagnosis not present

## 2015-08-26 DIAGNOSIS — Z7401 Bed confinement status: Secondary | ICD-10-CM | POA: Diagnosis not present

## 2015-08-26 DIAGNOSIS — Z466 Encounter for fitting and adjustment of urinary device: Secondary | ICD-10-CM | POA: Diagnosis not present

## 2015-08-26 DIAGNOSIS — Z7901 Long term (current) use of anticoagulants: Secondary | ICD-10-CM | POA: Diagnosis not present

## 2015-08-26 DIAGNOSIS — Z4800 Encounter for change or removal of nonsurgical wound dressing: Secondary | ICD-10-CM | POA: Diagnosis not present

## 2015-08-26 DIAGNOSIS — I10 Essential (primary) hypertension: Secondary | ICD-10-CM | POA: Diagnosis not present

## 2015-08-26 DIAGNOSIS — M6281 Muscle weakness (generalized): Secondary | ICD-10-CM | POA: Diagnosis not present

## 2015-08-27 DIAGNOSIS — Z7401 Bed confinement status: Secondary | ICD-10-CM | POA: Diagnosis not present

## 2015-08-27 DIAGNOSIS — E119 Type 2 diabetes mellitus without complications: Secondary | ICD-10-CM | POA: Diagnosis not present

## 2015-08-27 DIAGNOSIS — J449 Chronic obstructive pulmonary disease, unspecified: Secondary | ICD-10-CM | POA: Diagnosis not present

## 2015-08-27 DIAGNOSIS — I6782 Cerebral ischemia: Secondary | ICD-10-CM | POA: Diagnosis not present

## 2015-08-27 DIAGNOSIS — Z466 Encounter for fitting and adjustment of urinary device: Secondary | ICD-10-CM | POA: Diagnosis not present

## 2015-08-27 DIAGNOSIS — Z7901 Long term (current) use of anticoagulants: Secondary | ICD-10-CM | POA: Diagnosis not present

## 2015-08-27 DIAGNOSIS — Z7984 Long term (current) use of oral hypoglycemic drugs: Secondary | ICD-10-CM | POA: Diagnosis not present

## 2015-08-27 DIAGNOSIS — M6281 Muscle weakness (generalized): Secondary | ICD-10-CM | POA: Diagnosis not present

## 2015-08-27 DIAGNOSIS — I10 Essential (primary) hypertension: Secondary | ICD-10-CM | POA: Diagnosis not present

## 2015-08-27 DIAGNOSIS — Z4800 Encounter for change or removal of nonsurgical wound dressing: Secondary | ICD-10-CM | POA: Diagnosis not present

## 2015-08-27 DIAGNOSIS — L89892 Pressure ulcer of other site, stage 2: Secondary | ICD-10-CM | POA: Diagnosis not present

## 2015-08-30 DIAGNOSIS — Z7401 Bed confinement status: Secondary | ICD-10-CM | POA: Diagnosis not present

## 2015-08-30 DIAGNOSIS — E119 Type 2 diabetes mellitus without complications: Secondary | ICD-10-CM | POA: Diagnosis not present

## 2015-08-30 DIAGNOSIS — I10 Essential (primary) hypertension: Secondary | ICD-10-CM | POA: Diagnosis not present

## 2015-08-30 DIAGNOSIS — M6281 Muscle weakness (generalized): Secondary | ICD-10-CM | POA: Diagnosis not present

## 2015-08-30 DIAGNOSIS — L89892 Pressure ulcer of other site, stage 2: Secondary | ICD-10-CM | POA: Diagnosis not present

## 2015-08-30 DIAGNOSIS — J449 Chronic obstructive pulmonary disease, unspecified: Secondary | ICD-10-CM | POA: Diagnosis not present

## 2015-08-30 DIAGNOSIS — Z7901 Long term (current) use of anticoagulants: Secondary | ICD-10-CM | POA: Diagnosis not present

## 2015-08-30 DIAGNOSIS — Z466 Encounter for fitting and adjustment of urinary device: Secondary | ICD-10-CM | POA: Diagnosis not present

## 2015-08-30 DIAGNOSIS — Z4800 Encounter for change or removal of nonsurgical wound dressing: Secondary | ICD-10-CM | POA: Diagnosis not present

## 2015-08-30 DIAGNOSIS — I6782 Cerebral ischemia: Secondary | ICD-10-CM | POA: Diagnosis not present

## 2015-08-30 DIAGNOSIS — Z7984 Long term (current) use of oral hypoglycemic drugs: Secondary | ICD-10-CM | POA: Diagnosis not present

## 2015-08-31 DIAGNOSIS — Z7984 Long term (current) use of oral hypoglycemic drugs: Secondary | ICD-10-CM | POA: Diagnosis not present

## 2015-08-31 DIAGNOSIS — Z4800 Encounter for change or removal of nonsurgical wound dressing: Secondary | ICD-10-CM | POA: Diagnosis not present

## 2015-08-31 DIAGNOSIS — I6782 Cerebral ischemia: Secondary | ICD-10-CM | POA: Diagnosis not present

## 2015-08-31 DIAGNOSIS — Z7901 Long term (current) use of anticoagulants: Secondary | ICD-10-CM | POA: Diagnosis not present

## 2015-08-31 DIAGNOSIS — J449 Chronic obstructive pulmonary disease, unspecified: Secondary | ICD-10-CM | POA: Diagnosis not present

## 2015-08-31 DIAGNOSIS — L89892 Pressure ulcer of other site, stage 2: Secondary | ICD-10-CM | POA: Diagnosis not present

## 2015-08-31 DIAGNOSIS — I10 Essential (primary) hypertension: Secondary | ICD-10-CM | POA: Diagnosis not present

## 2015-08-31 DIAGNOSIS — M6281 Muscle weakness (generalized): Secondary | ICD-10-CM | POA: Diagnosis not present

## 2015-08-31 DIAGNOSIS — Z466 Encounter for fitting and adjustment of urinary device: Secondary | ICD-10-CM | POA: Diagnosis not present

## 2015-08-31 DIAGNOSIS — Z7401 Bed confinement status: Secondary | ICD-10-CM | POA: Diagnosis not present

## 2015-08-31 DIAGNOSIS — E119 Type 2 diabetes mellitus without complications: Secondary | ICD-10-CM | POA: Diagnosis not present

## 2015-09-01 DIAGNOSIS — L89892 Pressure ulcer of other site, stage 2: Secondary | ICD-10-CM | POA: Diagnosis not present

## 2015-09-01 DIAGNOSIS — M6281 Muscle weakness (generalized): Secondary | ICD-10-CM | POA: Diagnosis not present

## 2015-09-01 DIAGNOSIS — Z7901 Long term (current) use of anticoagulants: Secondary | ICD-10-CM | POA: Diagnosis not present

## 2015-09-01 DIAGNOSIS — I6782 Cerebral ischemia: Secondary | ICD-10-CM | POA: Diagnosis not present

## 2015-09-01 DIAGNOSIS — I10 Essential (primary) hypertension: Secondary | ICD-10-CM | POA: Diagnosis not present

## 2015-09-01 DIAGNOSIS — E119 Type 2 diabetes mellitus without complications: Secondary | ICD-10-CM | POA: Diagnosis not present

## 2015-09-01 DIAGNOSIS — Z4800 Encounter for change or removal of nonsurgical wound dressing: Secondary | ICD-10-CM | POA: Diagnosis not present

## 2015-09-01 DIAGNOSIS — Z466 Encounter for fitting and adjustment of urinary device: Secondary | ICD-10-CM | POA: Diagnosis not present

## 2015-09-01 DIAGNOSIS — Z7401 Bed confinement status: Secondary | ICD-10-CM | POA: Diagnosis not present

## 2015-09-01 DIAGNOSIS — J449 Chronic obstructive pulmonary disease, unspecified: Secondary | ICD-10-CM | POA: Diagnosis not present

## 2015-09-01 DIAGNOSIS — Z7984 Long term (current) use of oral hypoglycemic drugs: Secondary | ICD-10-CM | POA: Diagnosis not present

## 2015-09-02 DIAGNOSIS — M6281 Muscle weakness (generalized): Secondary | ICD-10-CM | POA: Diagnosis not present

## 2015-09-02 DIAGNOSIS — I10 Essential (primary) hypertension: Secondary | ICD-10-CM | POA: Diagnosis not present

## 2015-09-02 DIAGNOSIS — Z4800 Encounter for change or removal of nonsurgical wound dressing: Secondary | ICD-10-CM | POA: Diagnosis not present

## 2015-09-02 DIAGNOSIS — L89892 Pressure ulcer of other site, stage 2: Secondary | ICD-10-CM | POA: Diagnosis not present

## 2015-09-02 DIAGNOSIS — J449 Chronic obstructive pulmonary disease, unspecified: Secondary | ICD-10-CM | POA: Diagnosis not present

## 2015-09-02 DIAGNOSIS — Z466 Encounter for fitting and adjustment of urinary device: Secondary | ICD-10-CM | POA: Diagnosis not present

## 2015-09-02 DIAGNOSIS — Z7901 Long term (current) use of anticoagulants: Secondary | ICD-10-CM | POA: Diagnosis not present

## 2015-09-02 DIAGNOSIS — E119 Type 2 diabetes mellitus without complications: Secondary | ICD-10-CM | POA: Diagnosis not present

## 2015-09-02 DIAGNOSIS — I6782 Cerebral ischemia: Secondary | ICD-10-CM | POA: Diagnosis not present

## 2015-09-02 DIAGNOSIS — Z7984 Long term (current) use of oral hypoglycemic drugs: Secondary | ICD-10-CM | POA: Diagnosis not present

## 2015-09-02 DIAGNOSIS — Z7401 Bed confinement status: Secondary | ICD-10-CM | POA: Diagnosis not present

## 2015-09-03 DIAGNOSIS — E119 Type 2 diabetes mellitus without complications: Secondary | ICD-10-CM | POA: Diagnosis not present

## 2015-09-03 DIAGNOSIS — Z7401 Bed confinement status: Secondary | ICD-10-CM | POA: Diagnosis not present

## 2015-09-03 DIAGNOSIS — Z7984 Long term (current) use of oral hypoglycemic drugs: Secondary | ICD-10-CM | POA: Diagnosis not present

## 2015-09-03 DIAGNOSIS — Z4800 Encounter for change or removal of nonsurgical wound dressing: Secondary | ICD-10-CM | POA: Diagnosis not present

## 2015-09-03 DIAGNOSIS — Z466 Encounter for fitting and adjustment of urinary device: Secondary | ICD-10-CM | POA: Diagnosis not present

## 2015-09-03 DIAGNOSIS — I6782 Cerebral ischemia: Secondary | ICD-10-CM | POA: Diagnosis not present

## 2015-09-03 DIAGNOSIS — J449 Chronic obstructive pulmonary disease, unspecified: Secondary | ICD-10-CM | POA: Diagnosis not present

## 2015-09-03 DIAGNOSIS — Z7901 Long term (current) use of anticoagulants: Secondary | ICD-10-CM | POA: Diagnosis not present

## 2015-09-03 DIAGNOSIS — I10 Essential (primary) hypertension: Secondary | ICD-10-CM | POA: Diagnosis not present

## 2015-09-03 DIAGNOSIS — L89892 Pressure ulcer of other site, stage 2: Secondary | ICD-10-CM | POA: Diagnosis not present

## 2015-09-03 DIAGNOSIS — M6281 Muscle weakness (generalized): Secondary | ICD-10-CM | POA: Diagnosis not present

## 2015-09-05 DIAGNOSIS — Z7401 Bed confinement status: Secondary | ICD-10-CM | POA: Diagnosis not present

## 2015-09-05 DIAGNOSIS — R270 Ataxia, unspecified: Secondary | ICD-10-CM | POA: Diagnosis not present

## 2015-09-06 DIAGNOSIS — R52 Pain, unspecified: Secondary | ICD-10-CM | POA: Diagnosis not present

## 2015-09-06 DIAGNOSIS — N183 Chronic kidney disease, stage 3 (moderate): Secondary | ICD-10-CM | POA: Diagnosis not present

## 2015-09-06 DIAGNOSIS — S81809D Unspecified open wound, unspecified lower leg, subsequent encounter: Secondary | ICD-10-CM | POA: Diagnosis not present

## 2015-09-06 DIAGNOSIS — Z86711 Personal history of pulmonary embolism: Secondary | ICD-10-CM | POA: Diagnosis not present

## 2015-09-06 DIAGNOSIS — E559 Vitamin D deficiency, unspecified: Secondary | ICD-10-CM | POA: Diagnosis not present

## 2015-09-06 DIAGNOSIS — D649 Anemia, unspecified: Secondary | ICD-10-CM | POA: Diagnosis not present

## 2015-09-07 DIAGNOSIS — M6281 Muscle weakness (generalized): Secondary | ICD-10-CM | POA: Diagnosis not present

## 2015-09-07 DIAGNOSIS — G459 Transient cerebral ischemic attack, unspecified: Secondary | ICD-10-CM | POA: Diagnosis not present

## 2015-09-09 DIAGNOSIS — G459 Transient cerebral ischemic attack, unspecified: Secondary | ICD-10-CM | POA: Diagnosis not present

## 2015-09-09 DIAGNOSIS — M6281 Muscle weakness (generalized): Secondary | ICD-10-CM | POA: Diagnosis not present

## 2015-09-10 DIAGNOSIS — M6281 Muscle weakness (generalized): Secondary | ICD-10-CM | POA: Diagnosis not present

## 2015-09-10 DIAGNOSIS — G459 Transient cerebral ischemic attack, unspecified: Secondary | ICD-10-CM | POA: Diagnosis not present

## 2015-09-13 DIAGNOSIS — M6281 Muscle weakness (generalized): Secondary | ICD-10-CM | POA: Diagnosis not present

## 2015-09-13 DIAGNOSIS — G459 Transient cerebral ischemic attack, unspecified: Secondary | ICD-10-CM | POA: Diagnosis not present

## 2015-09-14 DIAGNOSIS — G459 Transient cerebral ischemic attack, unspecified: Secondary | ICD-10-CM | POA: Diagnosis not present

## 2015-09-14 DIAGNOSIS — M6281 Muscle weakness (generalized): Secondary | ICD-10-CM | POA: Diagnosis not present

## 2015-09-14 DIAGNOSIS — L89890 Pressure ulcer of other site, unstageable: Secondary | ICD-10-CM | POA: Diagnosis not present

## 2015-09-14 DIAGNOSIS — L97919 Non-pressure chronic ulcer of unspecified part of right lower leg with unspecified severity: Secondary | ICD-10-CM | POA: Diagnosis not present

## 2015-09-15 DIAGNOSIS — G459 Transient cerebral ischemic attack, unspecified: Secondary | ICD-10-CM | POA: Diagnosis not present

## 2015-09-15 DIAGNOSIS — M6281 Muscle weakness (generalized): Secondary | ICD-10-CM | POA: Diagnosis not present

## 2015-09-16 DIAGNOSIS — M6281 Muscle weakness (generalized): Secondary | ICD-10-CM | POA: Diagnosis not present

## 2015-09-16 DIAGNOSIS — G459 Transient cerebral ischemic attack, unspecified: Secondary | ICD-10-CM | POA: Diagnosis not present

## 2015-09-17 DIAGNOSIS — G459 Transient cerebral ischemic attack, unspecified: Secondary | ICD-10-CM | POA: Diagnosis not present

## 2015-09-17 DIAGNOSIS — M6281 Muscle weakness (generalized): Secondary | ICD-10-CM | POA: Diagnosis not present

## 2015-09-20 DIAGNOSIS — G459 Transient cerebral ischemic attack, unspecified: Secondary | ICD-10-CM | POA: Diagnosis not present

## 2015-09-20 DIAGNOSIS — M6281 Muscle weakness (generalized): Secondary | ICD-10-CM | POA: Diagnosis not present

## 2015-09-21 DIAGNOSIS — G459 Transient cerebral ischemic attack, unspecified: Secondary | ICD-10-CM | POA: Diagnosis not present

## 2015-09-21 DIAGNOSIS — M6281 Muscle weakness (generalized): Secondary | ICD-10-CM | POA: Diagnosis not present

## 2015-09-22 DIAGNOSIS — M6281 Muscle weakness (generalized): Secondary | ICD-10-CM | POA: Diagnosis not present

## 2015-09-22 DIAGNOSIS — G459 Transient cerebral ischemic attack, unspecified: Secondary | ICD-10-CM | POA: Diagnosis not present

## 2015-09-23 DIAGNOSIS — G459 Transient cerebral ischemic attack, unspecified: Secondary | ICD-10-CM | POA: Diagnosis not present

## 2015-09-23 DIAGNOSIS — M6281 Muscle weakness (generalized): Secondary | ICD-10-CM | POA: Diagnosis not present

## 2015-09-24 DIAGNOSIS — M6281 Muscle weakness (generalized): Secondary | ICD-10-CM | POA: Diagnosis not present

## 2015-09-24 DIAGNOSIS — G459 Transient cerebral ischemic attack, unspecified: Secondary | ICD-10-CM | POA: Diagnosis not present

## 2015-09-27 DIAGNOSIS — G459 Transient cerebral ischemic attack, unspecified: Secondary | ICD-10-CM | POA: Diagnosis not present

## 2015-09-27 DIAGNOSIS — M6281 Muscle weakness (generalized): Secondary | ICD-10-CM | POA: Diagnosis not present

## 2015-09-28 DIAGNOSIS — M6281 Muscle weakness (generalized): Secondary | ICD-10-CM | POA: Diagnosis not present

## 2015-09-28 DIAGNOSIS — L89309 Pressure ulcer of unspecified buttock, unspecified stage: Secondary | ICD-10-CM | POA: Diagnosis not present

## 2015-09-28 DIAGNOSIS — G459 Transient cerebral ischemic attack, unspecified: Secondary | ICD-10-CM | POA: Diagnosis not present

## 2015-09-28 DIAGNOSIS — L89890 Pressure ulcer of other site, unstageable: Secondary | ICD-10-CM | POA: Diagnosis not present

## 2015-09-28 DIAGNOSIS — L97919 Non-pressure chronic ulcer of unspecified part of right lower leg with unspecified severity: Secondary | ICD-10-CM | POA: Diagnosis not present

## 2015-09-29 DIAGNOSIS — G459 Transient cerebral ischemic attack, unspecified: Secondary | ICD-10-CM | POA: Diagnosis not present

## 2015-09-29 DIAGNOSIS — M6281 Muscle weakness (generalized): Secondary | ICD-10-CM | POA: Diagnosis not present

## 2015-09-30 DIAGNOSIS — M6281 Muscle weakness (generalized): Secondary | ICD-10-CM | POA: Diagnosis not present

## 2015-09-30 DIAGNOSIS — G459 Transient cerebral ischemic attack, unspecified: Secondary | ICD-10-CM | POA: Diagnosis not present

## 2015-10-01 DIAGNOSIS — M6281 Muscle weakness (generalized): Secondary | ICD-10-CM | POA: Diagnosis not present

## 2015-10-01 DIAGNOSIS — G459 Transient cerebral ischemic attack, unspecified: Secondary | ICD-10-CM | POA: Diagnosis not present

## 2015-10-04 DIAGNOSIS — M6281 Muscle weakness (generalized): Secondary | ICD-10-CM | POA: Diagnosis not present

## 2015-10-04 DIAGNOSIS — G459 Transient cerebral ischemic attack, unspecified: Secondary | ICD-10-CM | POA: Diagnosis not present

## 2015-10-05 DIAGNOSIS — L98499 Non-pressure chronic ulcer of skin of other sites with unspecified severity: Secondary | ICD-10-CM | POA: Diagnosis not present

## 2015-10-05 DIAGNOSIS — G459 Transient cerebral ischemic attack, unspecified: Secondary | ICD-10-CM | POA: Diagnosis not present

## 2015-10-05 DIAGNOSIS — M6281 Muscle weakness (generalized): Secondary | ICD-10-CM | POA: Diagnosis not present

## 2015-10-06 DIAGNOSIS — G459 Transient cerebral ischemic attack, unspecified: Secondary | ICD-10-CM | POA: Diagnosis not present

## 2015-10-06 DIAGNOSIS — M6281 Muscle weakness (generalized): Secondary | ICD-10-CM | POA: Diagnosis not present

## 2015-10-07 DIAGNOSIS — G459 Transient cerebral ischemic attack, unspecified: Secondary | ICD-10-CM | POA: Diagnosis not present

## 2015-10-07 DIAGNOSIS — M6281 Muscle weakness (generalized): Secondary | ICD-10-CM | POA: Diagnosis not present

## 2015-10-08 DIAGNOSIS — M549 Dorsalgia, unspecified: Secondary | ICD-10-CM | POA: Diagnosis not present

## 2015-10-08 DIAGNOSIS — L8990 Pressure ulcer of unspecified site, unspecified stage: Secondary | ICD-10-CM | POA: Diagnosis not present

## 2015-10-08 DIAGNOSIS — M6281 Muscle weakness (generalized): Secondary | ICD-10-CM | POA: Diagnosis not present

## 2015-10-08 DIAGNOSIS — I10 Essential (primary) hypertension: Secondary | ICD-10-CM | POA: Diagnosis not present

## 2015-10-08 DIAGNOSIS — Z7409 Other reduced mobility: Secondary | ICD-10-CM | POA: Diagnosis not present

## 2015-10-08 DIAGNOSIS — G459 Transient cerebral ischemic attack, unspecified: Secondary | ICD-10-CM | POA: Diagnosis not present

## 2015-10-11 DIAGNOSIS — M6281 Muscle weakness (generalized): Secondary | ICD-10-CM | POA: Diagnosis not present

## 2015-10-11 DIAGNOSIS — G459 Transient cerebral ischemic attack, unspecified: Secondary | ICD-10-CM | POA: Diagnosis not present

## 2015-10-12 DIAGNOSIS — M6281 Muscle weakness (generalized): Secondary | ICD-10-CM | POA: Diagnosis not present

## 2015-10-12 DIAGNOSIS — G459 Transient cerebral ischemic attack, unspecified: Secondary | ICD-10-CM | POA: Diagnosis not present

## 2015-10-13 DIAGNOSIS — M6281 Muscle weakness (generalized): Secondary | ICD-10-CM | POA: Diagnosis not present

## 2015-10-13 DIAGNOSIS — G459 Transient cerebral ischemic attack, unspecified: Secondary | ICD-10-CM | POA: Diagnosis not present

## 2015-10-14 DIAGNOSIS — M6281 Muscle weakness (generalized): Secondary | ICD-10-CM | POA: Diagnosis not present

## 2015-10-14 DIAGNOSIS — G459 Transient cerebral ischemic attack, unspecified: Secondary | ICD-10-CM | POA: Diagnosis not present

## 2015-10-15 DIAGNOSIS — M6281 Muscle weakness (generalized): Secondary | ICD-10-CM | POA: Diagnosis not present

## 2015-10-15 DIAGNOSIS — G459 Transient cerebral ischemic attack, unspecified: Secondary | ICD-10-CM | POA: Diagnosis not present

## 2015-10-18 DIAGNOSIS — M6281 Muscle weakness (generalized): Secondary | ICD-10-CM | POA: Diagnosis not present

## 2015-10-18 DIAGNOSIS — G459 Transient cerebral ischemic attack, unspecified: Secondary | ICD-10-CM | POA: Diagnosis not present

## 2015-10-19 DIAGNOSIS — G459 Transient cerebral ischemic attack, unspecified: Secondary | ICD-10-CM | POA: Diagnosis not present

## 2015-10-19 DIAGNOSIS — L97119 Non-pressure chronic ulcer of right thigh with unspecified severity: Secondary | ICD-10-CM | POA: Diagnosis not present

## 2015-10-19 DIAGNOSIS — M6281 Muscle weakness (generalized): Secondary | ICD-10-CM | POA: Diagnosis not present

## 2015-10-20 DIAGNOSIS — G459 Transient cerebral ischemic attack, unspecified: Secondary | ICD-10-CM | POA: Diagnosis not present

## 2015-10-20 DIAGNOSIS — M6281 Muscle weakness (generalized): Secondary | ICD-10-CM | POA: Diagnosis not present

## 2015-10-21 DIAGNOSIS — G459 Transient cerebral ischemic attack, unspecified: Secondary | ICD-10-CM | POA: Diagnosis not present

## 2015-10-21 DIAGNOSIS — M6281 Muscle weakness (generalized): Secondary | ICD-10-CM | POA: Diagnosis not present

## 2015-10-22 DIAGNOSIS — M6281 Muscle weakness (generalized): Secondary | ICD-10-CM | POA: Diagnosis not present

## 2015-10-22 DIAGNOSIS — J449 Chronic obstructive pulmonary disease, unspecified: Secondary | ICD-10-CM | POA: Diagnosis not present

## 2015-10-22 DIAGNOSIS — G459 Transient cerebral ischemic attack, unspecified: Secondary | ICD-10-CM | POA: Diagnosis not present

## 2015-10-22 DIAGNOSIS — I10 Essential (primary) hypertension: Secondary | ICD-10-CM | POA: Diagnosis not present

## 2015-10-22 DIAGNOSIS — I2699 Other pulmonary embolism without acute cor pulmonale: Secondary | ICD-10-CM | POA: Diagnosis not present

## 2015-10-22 DIAGNOSIS — Z7409 Other reduced mobility: Secondary | ICD-10-CM | POA: Diagnosis not present

## 2015-10-25 DIAGNOSIS — M6281 Muscle weakness (generalized): Secondary | ICD-10-CM | POA: Diagnosis not present

## 2015-10-25 DIAGNOSIS — G459 Transient cerebral ischemic attack, unspecified: Secondary | ICD-10-CM | POA: Diagnosis not present

## 2015-10-26 DIAGNOSIS — L98492 Non-pressure chronic ulcer of skin of other sites with fat layer exposed: Secondary | ICD-10-CM | POA: Diagnosis not present

## 2015-10-26 DIAGNOSIS — G459 Transient cerebral ischemic attack, unspecified: Secondary | ICD-10-CM | POA: Diagnosis not present

## 2015-10-26 DIAGNOSIS — M6281 Muscle weakness (generalized): Secondary | ICD-10-CM | POA: Diagnosis not present

## 2015-10-26 DIAGNOSIS — L89893 Pressure ulcer of other site, stage 3: Secondary | ICD-10-CM | POA: Diagnosis not present

## 2015-10-27 DIAGNOSIS — M6281 Muscle weakness (generalized): Secondary | ICD-10-CM | POA: Diagnosis not present

## 2015-10-27 DIAGNOSIS — G459 Transient cerebral ischemic attack, unspecified: Secondary | ICD-10-CM | POA: Diagnosis not present

## 2015-10-28 DIAGNOSIS — G459 Transient cerebral ischemic attack, unspecified: Secondary | ICD-10-CM | POA: Diagnosis not present

## 2015-10-28 DIAGNOSIS — M6281 Muscle weakness (generalized): Secondary | ICD-10-CM | POA: Diagnosis not present

## 2015-10-29 DIAGNOSIS — G459 Transient cerebral ischemic attack, unspecified: Secondary | ICD-10-CM | POA: Diagnosis not present

## 2015-10-29 DIAGNOSIS — M6281 Muscle weakness (generalized): Secondary | ICD-10-CM | POA: Diagnosis not present

## 2015-11-01 DIAGNOSIS — M6281 Muscle weakness (generalized): Secondary | ICD-10-CM | POA: Diagnosis not present

## 2015-11-01 DIAGNOSIS — G459 Transient cerebral ischemic attack, unspecified: Secondary | ICD-10-CM | POA: Diagnosis not present

## 2015-11-02 DIAGNOSIS — M6281 Muscle weakness (generalized): Secondary | ICD-10-CM | POA: Diagnosis not present

## 2015-11-02 DIAGNOSIS — L89893 Pressure ulcer of other site, stage 3: Secondary | ICD-10-CM | POA: Diagnosis not present

## 2015-11-02 DIAGNOSIS — G459 Transient cerebral ischemic attack, unspecified: Secondary | ICD-10-CM | POA: Diagnosis not present

## 2015-11-02 DIAGNOSIS — L98492 Non-pressure chronic ulcer of skin of other sites with fat layer exposed: Secondary | ICD-10-CM | POA: Diagnosis not present

## 2015-11-03 DIAGNOSIS — M6281 Muscle weakness (generalized): Secondary | ICD-10-CM | POA: Diagnosis not present

## 2015-11-03 DIAGNOSIS — G459 Transient cerebral ischemic attack, unspecified: Secondary | ICD-10-CM | POA: Diagnosis not present

## 2015-11-04 DIAGNOSIS — G459 Transient cerebral ischemic attack, unspecified: Secondary | ICD-10-CM | POA: Diagnosis not present

## 2015-11-04 DIAGNOSIS — M6281 Muscle weakness (generalized): Secondary | ICD-10-CM | POA: Diagnosis not present

## 2015-11-05 DIAGNOSIS — G459 Transient cerebral ischemic attack, unspecified: Secondary | ICD-10-CM | POA: Diagnosis not present

## 2015-11-05 DIAGNOSIS — M6281 Muscle weakness (generalized): Secondary | ICD-10-CM | POA: Diagnosis not present

## 2015-11-08 DIAGNOSIS — G459 Transient cerebral ischemic attack, unspecified: Secondary | ICD-10-CM | POA: Diagnosis not present

## 2015-11-08 DIAGNOSIS — M6281 Muscle weakness (generalized): Secondary | ICD-10-CM | POA: Diagnosis not present

## 2015-11-09 DIAGNOSIS — M6281 Muscle weakness (generalized): Secondary | ICD-10-CM | POA: Diagnosis not present

## 2015-11-09 DIAGNOSIS — L98492 Non-pressure chronic ulcer of skin of other sites with fat layer exposed: Secondary | ICD-10-CM | POA: Diagnosis not present

## 2015-11-09 DIAGNOSIS — L84 Corns and callosities: Secondary | ICD-10-CM | POA: Diagnosis not present

## 2015-11-09 DIAGNOSIS — L89893 Pressure ulcer of other site, stage 3: Secondary | ICD-10-CM | POA: Diagnosis not present

## 2015-11-09 DIAGNOSIS — G459 Transient cerebral ischemic attack, unspecified: Secondary | ICD-10-CM | POA: Diagnosis not present

## 2015-11-09 DIAGNOSIS — E1151 Type 2 diabetes mellitus with diabetic peripheral angiopathy without gangrene: Secondary | ICD-10-CM | POA: Diagnosis not present

## 2015-11-10 DIAGNOSIS — G459 Transient cerebral ischemic attack, unspecified: Secondary | ICD-10-CM | POA: Diagnosis not present

## 2015-11-10 DIAGNOSIS — M6281 Muscle weakness (generalized): Secondary | ICD-10-CM | POA: Diagnosis not present

## 2015-11-11 DIAGNOSIS — M6281 Muscle weakness (generalized): Secondary | ICD-10-CM | POA: Diagnosis not present

## 2015-11-11 DIAGNOSIS — G459 Transient cerebral ischemic attack, unspecified: Secondary | ICD-10-CM | POA: Diagnosis not present

## 2015-11-12 DIAGNOSIS — G459 Transient cerebral ischemic attack, unspecified: Secondary | ICD-10-CM | POA: Diagnosis not present

## 2015-11-12 DIAGNOSIS — M6281 Muscle weakness (generalized): Secondary | ICD-10-CM | POA: Diagnosis not present

## 2015-11-15 DIAGNOSIS — M6281 Muscle weakness (generalized): Secondary | ICD-10-CM | POA: Diagnosis not present

## 2015-11-15 DIAGNOSIS — G459 Transient cerebral ischemic attack, unspecified: Secondary | ICD-10-CM | POA: Diagnosis not present

## 2015-11-16 DIAGNOSIS — M6281 Muscle weakness (generalized): Secondary | ICD-10-CM | POA: Diagnosis not present

## 2015-11-16 DIAGNOSIS — L98499 Non-pressure chronic ulcer of skin of other sites with unspecified severity: Secondary | ICD-10-CM | POA: Diagnosis not present

## 2015-11-16 DIAGNOSIS — G459 Transient cerebral ischemic attack, unspecified: Secondary | ICD-10-CM | POA: Diagnosis not present

## 2015-11-17 DIAGNOSIS — Z789 Other specified health status: Secondary | ICD-10-CM | POA: Diagnosis not present

## 2015-11-17 DIAGNOSIS — M6281 Muscle weakness (generalized): Secondary | ICD-10-CM | POA: Diagnosis not present

## 2015-11-17 DIAGNOSIS — Z79899 Other long term (current) drug therapy: Secondary | ICD-10-CM | POA: Diagnosis not present

## 2015-11-17 DIAGNOSIS — G459 Transient cerebral ischemic attack, unspecified: Secondary | ICD-10-CM | POA: Diagnosis not present

## 2015-11-17 DIAGNOSIS — Z89619 Acquired absence of unspecified leg above knee: Secondary | ICD-10-CM | POA: Diagnosis not present

## 2015-11-18 DIAGNOSIS — G459 Transient cerebral ischemic attack, unspecified: Secondary | ICD-10-CM | POA: Diagnosis not present

## 2015-11-18 DIAGNOSIS — M6281 Muscle weakness (generalized): Secondary | ICD-10-CM | POA: Diagnosis not present

## 2015-11-19 DIAGNOSIS — G459 Transient cerebral ischemic attack, unspecified: Secondary | ICD-10-CM | POA: Diagnosis not present

## 2015-11-19 DIAGNOSIS — M6281 Muscle weakness (generalized): Secondary | ICD-10-CM | POA: Diagnosis not present

## 2016-02-25 DIAGNOSIS — L98492 Non-pressure chronic ulcer of skin of other sites with fat layer exposed: Secondary | ICD-10-CM | POA: Diagnosis not present

## 2016-02-25 DIAGNOSIS — L97119 Non-pressure chronic ulcer of right thigh with unspecified severity: Secondary | ICD-10-CM | POA: Diagnosis not present

## 2016-03-02 DIAGNOSIS — Z789 Other specified health status: Secondary | ICD-10-CM | POA: Diagnosis not present

## 2016-03-06 DIAGNOSIS — L97119 Non-pressure chronic ulcer of right thigh with unspecified severity: Secondary | ICD-10-CM | POA: Diagnosis not present

## 2016-03-06 DIAGNOSIS — L98499 Non-pressure chronic ulcer of skin of other sites with unspecified severity: Secondary | ICD-10-CM | POA: Diagnosis not present

## 2016-03-10 DIAGNOSIS — L98499 Non-pressure chronic ulcer of skin of other sites with unspecified severity: Secondary | ICD-10-CM | POA: Diagnosis not present

## 2016-03-15 DIAGNOSIS — M549 Dorsalgia, unspecified: Secondary | ICD-10-CM | POA: Diagnosis not present

## 2016-03-15 DIAGNOSIS — E119 Type 2 diabetes mellitus without complications: Secondary | ICD-10-CM | POA: Diagnosis not present

## 2016-03-15 DIAGNOSIS — I10 Essential (primary) hypertension: Secondary | ICD-10-CM | POA: Diagnosis not present

## 2016-03-15 DIAGNOSIS — J449 Chronic obstructive pulmonary disease, unspecified: Secondary | ICD-10-CM | POA: Diagnosis not present

## 2016-03-17 DIAGNOSIS — L98499 Non-pressure chronic ulcer of skin of other sites with unspecified severity: Secondary | ICD-10-CM | POA: Diagnosis not present

## 2016-03-23 DIAGNOSIS — S81809A Unspecified open wound, unspecified lower leg, initial encounter: Secondary | ICD-10-CM | POA: Diagnosis not present

## 2016-03-23 DIAGNOSIS — J449 Chronic obstructive pulmonary disease, unspecified: Secondary | ICD-10-CM | POA: Diagnosis not present

## 2016-03-23 DIAGNOSIS — Z7901 Long term (current) use of anticoagulants: Secondary | ICD-10-CM | POA: Diagnosis not present

## 2016-03-23 DIAGNOSIS — Z7409 Other reduced mobility: Secondary | ICD-10-CM | POA: Diagnosis not present

## 2016-03-24 DIAGNOSIS — L98499 Non-pressure chronic ulcer of skin of other sites with unspecified severity: Secondary | ICD-10-CM | POA: Diagnosis not present

## 2016-03-31 DIAGNOSIS — L98499 Non-pressure chronic ulcer of skin of other sites with unspecified severity: Secondary | ICD-10-CM | POA: Diagnosis not present

## 2016-04-03 DIAGNOSIS — M6281 Muscle weakness (generalized): Secondary | ICD-10-CM | POA: Diagnosis not present

## 2016-04-03 DIAGNOSIS — G459 Transient cerebral ischemic attack, unspecified: Secondary | ICD-10-CM | POA: Diagnosis not present

## 2016-04-03 DIAGNOSIS — R05 Cough: Secondary | ICD-10-CM | POA: Diagnosis not present

## 2016-04-04 DIAGNOSIS — G459 Transient cerebral ischemic attack, unspecified: Secondary | ICD-10-CM | POA: Diagnosis not present

## 2016-04-04 DIAGNOSIS — M6281 Muscle weakness (generalized): Secondary | ICD-10-CM | POA: Diagnosis not present

## 2016-04-05 DIAGNOSIS — M6281 Muscle weakness (generalized): Secondary | ICD-10-CM | POA: Diagnosis not present

## 2016-04-05 DIAGNOSIS — G459 Transient cerebral ischemic attack, unspecified: Secondary | ICD-10-CM | POA: Diagnosis not present

## 2016-04-06 DIAGNOSIS — M6281 Muscle weakness (generalized): Secondary | ICD-10-CM | POA: Diagnosis not present

## 2016-04-06 DIAGNOSIS — G459 Transient cerebral ischemic attack, unspecified: Secondary | ICD-10-CM | POA: Diagnosis not present

## 2016-04-07 DIAGNOSIS — G459 Transient cerebral ischemic attack, unspecified: Secondary | ICD-10-CM | POA: Diagnosis not present

## 2016-04-07 DIAGNOSIS — L98499 Non-pressure chronic ulcer of skin of other sites with unspecified severity: Secondary | ICD-10-CM | POA: Diagnosis not present

## 2016-04-07 DIAGNOSIS — M6281 Muscle weakness (generalized): Secondary | ICD-10-CM | POA: Diagnosis not present

## 2016-04-10 DIAGNOSIS — M6281 Muscle weakness (generalized): Secondary | ICD-10-CM | POA: Diagnosis not present

## 2016-04-10 DIAGNOSIS — G459 Transient cerebral ischemic attack, unspecified: Secondary | ICD-10-CM | POA: Diagnosis not present

## 2016-04-11 DIAGNOSIS — M6281 Muscle weakness (generalized): Secondary | ICD-10-CM | POA: Diagnosis not present

## 2016-04-11 DIAGNOSIS — G459 Transient cerebral ischemic attack, unspecified: Secondary | ICD-10-CM | POA: Diagnosis not present

## 2016-04-12 DIAGNOSIS — R05 Cough: Secondary | ICD-10-CM | POA: Diagnosis not present

## 2016-04-12 DIAGNOSIS — J449 Chronic obstructive pulmonary disease, unspecified: Secondary | ICD-10-CM | POA: Diagnosis not present

## 2016-04-12 DIAGNOSIS — I10 Essential (primary) hypertension: Secondary | ICD-10-CM | POA: Diagnosis not present

## 2016-04-12 DIAGNOSIS — E119 Type 2 diabetes mellitus without complications: Secondary | ICD-10-CM | POA: Diagnosis not present

## 2016-04-12 DIAGNOSIS — G459 Transient cerebral ischemic attack, unspecified: Secondary | ICD-10-CM | POA: Diagnosis not present

## 2016-04-12 DIAGNOSIS — M6281 Muscle weakness (generalized): Secondary | ICD-10-CM | POA: Diagnosis not present

## 2016-04-13 DIAGNOSIS — M6281 Muscle weakness (generalized): Secondary | ICD-10-CM | POA: Diagnosis not present

## 2016-04-13 DIAGNOSIS — G459 Transient cerebral ischemic attack, unspecified: Secondary | ICD-10-CM | POA: Diagnosis not present

## 2016-04-14 DIAGNOSIS — M6281 Muscle weakness (generalized): Secondary | ICD-10-CM | POA: Diagnosis not present

## 2016-04-14 DIAGNOSIS — G459 Transient cerebral ischemic attack, unspecified: Secondary | ICD-10-CM | POA: Diagnosis not present

## 2016-04-17 DIAGNOSIS — M6281 Muscle weakness (generalized): Secondary | ICD-10-CM | POA: Diagnosis not present

## 2016-04-17 DIAGNOSIS — L98499 Non-pressure chronic ulcer of skin of other sites with unspecified severity: Secondary | ICD-10-CM | POA: Diagnosis not present

## 2016-04-17 DIAGNOSIS — G459 Transient cerebral ischemic attack, unspecified: Secondary | ICD-10-CM | POA: Diagnosis not present

## 2016-04-18 DIAGNOSIS — M6281 Muscle weakness (generalized): Secondary | ICD-10-CM | POA: Diagnosis not present

## 2016-04-18 DIAGNOSIS — G459 Transient cerebral ischemic attack, unspecified: Secondary | ICD-10-CM | POA: Diagnosis not present

## 2016-04-21 DIAGNOSIS — L98499 Non-pressure chronic ulcer of skin of other sites with unspecified severity: Secondary | ICD-10-CM | POA: Diagnosis not present

## 2016-04-27 DIAGNOSIS — Z7409 Other reduced mobility: Secondary | ICD-10-CM | POA: Diagnosis not present

## 2016-04-27 DIAGNOSIS — S31109A Unspecified open wound of abdominal wall, unspecified quadrant without penetration into peritoneal cavity, initial encounter: Secondary | ICD-10-CM | POA: Diagnosis not present

## 2016-04-27 DIAGNOSIS — J449 Chronic obstructive pulmonary disease, unspecified: Secondary | ICD-10-CM | POA: Diagnosis not present

## 2016-04-27 DIAGNOSIS — E119 Type 2 diabetes mellitus without complications: Secondary | ICD-10-CM | POA: Diagnosis not present

## 2016-04-28 DIAGNOSIS — G459 Transient cerebral ischemic attack, unspecified: Secondary | ICD-10-CM | POA: Diagnosis not present

## 2016-04-28 DIAGNOSIS — M6281 Muscle weakness (generalized): Secondary | ICD-10-CM | POA: Diagnosis not present

## 2016-05-01 DIAGNOSIS — G459 Transient cerebral ischemic attack, unspecified: Secondary | ICD-10-CM | POA: Diagnosis not present

## 2016-05-01 DIAGNOSIS — L98499 Non-pressure chronic ulcer of skin of other sites with unspecified severity: Secondary | ICD-10-CM | POA: Diagnosis not present

## 2016-05-01 DIAGNOSIS — M6281 Muscle weakness (generalized): Secondary | ICD-10-CM | POA: Diagnosis not present

## 2016-05-02 DIAGNOSIS — M6281 Muscle weakness (generalized): Secondary | ICD-10-CM | POA: Diagnosis not present

## 2016-05-02 DIAGNOSIS — G459 Transient cerebral ischemic attack, unspecified: Secondary | ICD-10-CM | POA: Diagnosis not present

## 2016-05-03 DIAGNOSIS — M6281 Muscle weakness (generalized): Secondary | ICD-10-CM | POA: Diagnosis not present

## 2016-05-03 DIAGNOSIS — G459 Transient cerebral ischemic attack, unspecified: Secondary | ICD-10-CM | POA: Diagnosis not present

## 2016-05-04 DIAGNOSIS — M6281 Muscle weakness (generalized): Secondary | ICD-10-CM | POA: Diagnosis not present

## 2016-05-04 DIAGNOSIS — I10 Essential (primary) hypertension: Secondary | ICD-10-CM | POA: Diagnosis not present

## 2016-05-04 DIAGNOSIS — Z86711 Personal history of pulmonary embolism: Secondary | ICD-10-CM | POA: Diagnosis not present

## 2016-05-04 DIAGNOSIS — E119 Type 2 diabetes mellitus without complications: Secondary | ICD-10-CM | POA: Diagnosis not present

## 2016-05-04 DIAGNOSIS — G459 Transient cerebral ischemic attack, unspecified: Secondary | ICD-10-CM | POA: Diagnosis not present

## 2016-05-04 DIAGNOSIS — Z789 Other specified health status: Secondary | ICD-10-CM | POA: Diagnosis not present

## 2016-05-05 DIAGNOSIS — M6281 Muscle weakness (generalized): Secondary | ICD-10-CM | POA: Diagnosis not present

## 2016-05-05 DIAGNOSIS — G459 Transient cerebral ischemic attack, unspecified: Secondary | ICD-10-CM | POA: Diagnosis not present

## 2016-05-08 DIAGNOSIS — M6281 Muscle weakness (generalized): Secondary | ICD-10-CM | POA: Diagnosis not present

## 2016-05-08 DIAGNOSIS — L98499 Non-pressure chronic ulcer of skin of other sites with unspecified severity: Secondary | ICD-10-CM | POA: Diagnosis not present

## 2016-05-08 DIAGNOSIS — G459 Transient cerebral ischemic attack, unspecified: Secondary | ICD-10-CM | POA: Diagnosis not present

## 2016-05-09 DIAGNOSIS — G459 Transient cerebral ischemic attack, unspecified: Secondary | ICD-10-CM | POA: Diagnosis not present

## 2016-05-09 DIAGNOSIS — M6281 Muscle weakness (generalized): Secondary | ICD-10-CM | POA: Diagnosis not present

## 2016-05-10 DIAGNOSIS — M6281 Muscle weakness (generalized): Secondary | ICD-10-CM | POA: Diagnosis not present

## 2016-05-10 DIAGNOSIS — G459 Transient cerebral ischemic attack, unspecified: Secondary | ICD-10-CM | POA: Diagnosis not present

## 2016-05-11 DIAGNOSIS — M6281 Muscle weakness (generalized): Secondary | ICD-10-CM | POA: Diagnosis not present

## 2016-05-11 DIAGNOSIS — G459 Transient cerebral ischemic attack, unspecified: Secondary | ICD-10-CM | POA: Diagnosis not present

## 2016-05-12 DIAGNOSIS — M6281 Muscle weakness (generalized): Secondary | ICD-10-CM | POA: Diagnosis not present

## 2016-05-12 DIAGNOSIS — G459 Transient cerebral ischemic attack, unspecified: Secondary | ICD-10-CM | POA: Diagnosis not present

## 2016-05-15 DIAGNOSIS — M6281 Muscle weakness (generalized): Secondary | ICD-10-CM | POA: Diagnosis not present

## 2016-05-15 DIAGNOSIS — G459 Transient cerebral ischemic attack, unspecified: Secondary | ICD-10-CM | POA: Diagnosis not present

## 2016-05-15 DIAGNOSIS — L98499 Non-pressure chronic ulcer of skin of other sites with unspecified severity: Secondary | ICD-10-CM | POA: Diagnosis not present

## 2016-05-16 DIAGNOSIS — G459 Transient cerebral ischemic attack, unspecified: Secondary | ICD-10-CM | POA: Diagnosis not present

## 2016-05-16 DIAGNOSIS — M6281 Muscle weakness (generalized): Secondary | ICD-10-CM | POA: Diagnosis not present

## 2016-05-17 DIAGNOSIS — M6281 Muscle weakness (generalized): Secondary | ICD-10-CM | POA: Diagnosis not present

## 2016-05-17 DIAGNOSIS — G459 Transient cerebral ischemic attack, unspecified: Secondary | ICD-10-CM | POA: Diagnosis not present

## 2016-05-18 DIAGNOSIS — M6281 Muscle weakness (generalized): Secondary | ICD-10-CM | POA: Diagnosis not present

## 2016-05-18 DIAGNOSIS — G459 Transient cerebral ischemic attack, unspecified: Secondary | ICD-10-CM | POA: Diagnosis not present

## 2016-05-19 DIAGNOSIS — M6281 Muscle weakness (generalized): Secondary | ICD-10-CM | POA: Diagnosis not present

## 2016-05-19 DIAGNOSIS — G459 Transient cerebral ischemic attack, unspecified: Secondary | ICD-10-CM | POA: Diagnosis not present

## 2016-05-20 DIAGNOSIS — D649 Anemia, unspecified: Secondary | ICD-10-CM | POA: Diagnosis not present

## 2016-05-20 DIAGNOSIS — I10 Essential (primary) hypertension: Secondary | ICD-10-CM | POA: Diagnosis not present

## 2016-05-20 DIAGNOSIS — E119 Type 2 diabetes mellitus without complications: Secondary | ICD-10-CM | POA: Diagnosis not present

## 2016-05-20 DIAGNOSIS — Z789 Other specified health status: Secondary | ICD-10-CM | POA: Diagnosis not present

## 2016-05-22 DIAGNOSIS — G459 Transient cerebral ischemic attack, unspecified: Secondary | ICD-10-CM | POA: Diagnosis not present

## 2016-05-22 DIAGNOSIS — M6281 Muscle weakness (generalized): Secondary | ICD-10-CM | POA: Diagnosis not present

## 2016-05-23 DIAGNOSIS — M6281 Muscle weakness (generalized): Secondary | ICD-10-CM | POA: Diagnosis not present

## 2016-05-23 DIAGNOSIS — G459 Transient cerebral ischemic attack, unspecified: Secondary | ICD-10-CM | POA: Diagnosis not present

## 2016-05-24 DIAGNOSIS — M6281 Muscle weakness (generalized): Secondary | ICD-10-CM | POA: Diagnosis not present

## 2016-05-24 DIAGNOSIS — G459 Transient cerebral ischemic attack, unspecified: Secondary | ICD-10-CM | POA: Diagnosis not present

## 2016-05-25 DIAGNOSIS — G459 Transient cerebral ischemic attack, unspecified: Secondary | ICD-10-CM | POA: Diagnosis not present

## 2016-05-25 DIAGNOSIS — M6281 Muscle weakness (generalized): Secondary | ICD-10-CM | POA: Diagnosis not present

## 2016-05-26 DIAGNOSIS — G459 Transient cerebral ischemic attack, unspecified: Secondary | ICD-10-CM | POA: Diagnosis not present

## 2016-05-26 DIAGNOSIS — M6281 Muscle weakness (generalized): Secondary | ICD-10-CM | POA: Diagnosis not present

## 2016-05-29 DIAGNOSIS — L03031 Cellulitis of right toe: Secondary | ICD-10-CM | POA: Diagnosis not present

## 2016-05-29 DIAGNOSIS — M6281 Muscle weakness (generalized): Secondary | ICD-10-CM | POA: Diagnosis not present

## 2016-05-29 DIAGNOSIS — G459 Transient cerebral ischemic attack, unspecified: Secondary | ICD-10-CM | POA: Diagnosis not present

## 2016-05-29 DIAGNOSIS — L03032 Cellulitis of left toe: Secondary | ICD-10-CM | POA: Diagnosis not present

## 2016-05-30 DIAGNOSIS — G459 Transient cerebral ischemic attack, unspecified: Secondary | ICD-10-CM | POA: Diagnosis not present

## 2016-05-30 DIAGNOSIS — M6281 Muscle weakness (generalized): Secondary | ICD-10-CM | POA: Diagnosis not present

## 2016-05-31 DIAGNOSIS — M6281 Muscle weakness (generalized): Secondary | ICD-10-CM | POA: Diagnosis not present

## 2016-05-31 DIAGNOSIS — G459 Transient cerebral ischemic attack, unspecified: Secondary | ICD-10-CM | POA: Diagnosis not present

## 2016-06-01 DIAGNOSIS — M6281 Muscle weakness (generalized): Secondary | ICD-10-CM | POA: Diagnosis not present

## 2016-06-01 DIAGNOSIS — G459 Transient cerebral ischemic attack, unspecified: Secondary | ICD-10-CM | POA: Diagnosis not present

## 2016-06-02 DIAGNOSIS — Z7409 Other reduced mobility: Secondary | ICD-10-CM | POA: Diagnosis not present

## 2016-06-02 DIAGNOSIS — M6281 Muscle weakness (generalized): Secondary | ICD-10-CM | POA: Diagnosis not present

## 2016-06-02 DIAGNOSIS — E119 Type 2 diabetes mellitus without complications: Secondary | ICD-10-CM | POA: Diagnosis not present

## 2016-06-02 DIAGNOSIS — G459 Transient cerebral ischemic attack, unspecified: Secondary | ICD-10-CM | POA: Diagnosis not present

## 2016-06-02 DIAGNOSIS — I10 Essential (primary) hypertension: Secondary | ICD-10-CM | POA: Diagnosis not present

## 2016-06-02 DIAGNOSIS — J449 Chronic obstructive pulmonary disease, unspecified: Secondary | ICD-10-CM | POA: Diagnosis not present

## 2016-06-05 DIAGNOSIS — M6281 Muscle weakness (generalized): Secondary | ICD-10-CM | POA: Diagnosis not present

## 2016-06-05 DIAGNOSIS — G459 Transient cerebral ischemic attack, unspecified: Secondary | ICD-10-CM | POA: Diagnosis not present

## 2016-06-06 DIAGNOSIS — M6281 Muscle weakness (generalized): Secondary | ICD-10-CM | POA: Diagnosis not present

## 2016-06-06 DIAGNOSIS — G459 Transient cerebral ischemic attack, unspecified: Secondary | ICD-10-CM | POA: Diagnosis not present

## 2016-06-07 DIAGNOSIS — M6281 Muscle weakness (generalized): Secondary | ICD-10-CM | POA: Diagnosis not present

## 2016-06-07 DIAGNOSIS — G459 Transient cerebral ischemic attack, unspecified: Secondary | ICD-10-CM | POA: Diagnosis not present

## 2016-06-08 DIAGNOSIS — M6281 Muscle weakness (generalized): Secondary | ICD-10-CM | POA: Diagnosis not present

## 2016-06-08 DIAGNOSIS — G459 Transient cerebral ischemic attack, unspecified: Secondary | ICD-10-CM | POA: Diagnosis not present

## 2016-06-08 DIAGNOSIS — L98499 Non-pressure chronic ulcer of skin of other sites with unspecified severity: Secondary | ICD-10-CM | POA: Diagnosis not present

## 2016-06-09 DIAGNOSIS — M6281 Muscle weakness (generalized): Secondary | ICD-10-CM | POA: Diagnosis not present

## 2016-06-09 DIAGNOSIS — G459 Transient cerebral ischemic attack, unspecified: Secondary | ICD-10-CM | POA: Diagnosis not present

## 2016-06-12 DIAGNOSIS — M6281 Muscle weakness (generalized): Secondary | ICD-10-CM | POA: Diagnosis not present

## 2016-06-12 DIAGNOSIS — G459 Transient cerebral ischemic attack, unspecified: Secondary | ICD-10-CM | POA: Diagnosis not present

## 2016-06-12 DIAGNOSIS — L98499 Non-pressure chronic ulcer of skin of other sites with unspecified severity: Secondary | ICD-10-CM | POA: Diagnosis not present

## 2016-06-13 DIAGNOSIS — G459 Transient cerebral ischemic attack, unspecified: Secondary | ICD-10-CM | POA: Diagnosis not present

## 2016-06-13 DIAGNOSIS — M6281 Muscle weakness (generalized): Secondary | ICD-10-CM | POA: Diagnosis not present

## 2016-06-14 DIAGNOSIS — G459 Transient cerebral ischemic attack, unspecified: Secondary | ICD-10-CM | POA: Diagnosis not present

## 2016-06-14 DIAGNOSIS — M6281 Muscle weakness (generalized): Secondary | ICD-10-CM | POA: Diagnosis not present

## 2016-06-15 DIAGNOSIS — G459 Transient cerebral ischemic attack, unspecified: Secondary | ICD-10-CM | POA: Diagnosis not present

## 2016-06-15 DIAGNOSIS — M6281 Muscle weakness (generalized): Secondary | ICD-10-CM | POA: Diagnosis not present

## 2016-06-15 DIAGNOSIS — L600 Ingrowing nail: Secondary | ICD-10-CM | POA: Diagnosis not present

## 2016-06-16 DIAGNOSIS — G459 Transient cerebral ischemic attack, unspecified: Secondary | ICD-10-CM | POA: Diagnosis not present

## 2016-06-16 DIAGNOSIS — M6281 Muscle weakness (generalized): Secondary | ICD-10-CM | POA: Diagnosis not present

## 2016-06-19 DIAGNOSIS — G459 Transient cerebral ischemic attack, unspecified: Secondary | ICD-10-CM | POA: Diagnosis not present

## 2016-06-19 DIAGNOSIS — L98499 Non-pressure chronic ulcer of skin of other sites with unspecified severity: Secondary | ICD-10-CM | POA: Diagnosis not present

## 2016-06-19 DIAGNOSIS — M6281 Muscle weakness (generalized): Secondary | ICD-10-CM | POA: Diagnosis not present

## 2016-06-20 DIAGNOSIS — M6281 Muscle weakness (generalized): Secondary | ICD-10-CM | POA: Diagnosis not present

## 2016-06-20 DIAGNOSIS — G459 Transient cerebral ischemic attack, unspecified: Secondary | ICD-10-CM | POA: Diagnosis not present

## 2016-06-21 DIAGNOSIS — G459 Transient cerebral ischemic attack, unspecified: Secondary | ICD-10-CM | POA: Diagnosis not present

## 2016-06-21 DIAGNOSIS — M6281 Muscle weakness (generalized): Secondary | ICD-10-CM | POA: Diagnosis not present

## 2016-06-22 DIAGNOSIS — G459 Transient cerebral ischemic attack, unspecified: Secondary | ICD-10-CM | POA: Diagnosis not present

## 2016-06-22 DIAGNOSIS — M6281 Muscle weakness (generalized): Secondary | ICD-10-CM | POA: Diagnosis not present

## 2016-06-23 DIAGNOSIS — M6281 Muscle weakness (generalized): Secondary | ICD-10-CM | POA: Diagnosis not present

## 2016-06-23 DIAGNOSIS — G459 Transient cerebral ischemic attack, unspecified: Secondary | ICD-10-CM | POA: Diagnosis not present

## 2016-06-26 DIAGNOSIS — G459 Transient cerebral ischemic attack, unspecified: Secondary | ICD-10-CM | POA: Diagnosis not present

## 2016-06-26 DIAGNOSIS — L98499 Non-pressure chronic ulcer of skin of other sites with unspecified severity: Secondary | ICD-10-CM | POA: Diagnosis not present

## 2016-06-26 DIAGNOSIS — M6281 Muscle weakness (generalized): Secondary | ICD-10-CM | POA: Diagnosis not present

## 2016-06-27 DIAGNOSIS — E1151 Type 2 diabetes mellitus with diabetic peripheral angiopathy without gangrene: Secondary | ICD-10-CM | POA: Diagnosis not present

## 2016-06-27 DIAGNOSIS — M6281 Muscle weakness (generalized): Secondary | ICD-10-CM | POA: Diagnosis not present

## 2016-06-27 DIAGNOSIS — L84 Corns and callosities: Secondary | ICD-10-CM | POA: Diagnosis not present

## 2016-06-27 DIAGNOSIS — G459 Transient cerebral ischemic attack, unspecified: Secondary | ICD-10-CM | POA: Diagnosis not present

## 2016-06-28 DIAGNOSIS — M6281 Muscle weakness (generalized): Secondary | ICD-10-CM | POA: Diagnosis not present

## 2016-06-28 DIAGNOSIS — G459 Transient cerebral ischemic attack, unspecified: Secondary | ICD-10-CM | POA: Diagnosis not present

## 2016-06-29 DIAGNOSIS — I10 Essential (primary) hypertension: Secondary | ICD-10-CM | POA: Diagnosis not present

## 2016-06-29 DIAGNOSIS — I2782 Chronic pulmonary embolism: Secondary | ICD-10-CM | POA: Diagnosis not present

## 2016-06-29 DIAGNOSIS — S31109A Unspecified open wound of abdominal wall, unspecified quadrant without penetration into peritoneal cavity, initial encounter: Secondary | ICD-10-CM | POA: Diagnosis not present

## 2016-06-29 DIAGNOSIS — Z8673 Personal history of transient ischemic attack (TIA), and cerebral infarction without residual deficits: Secondary | ICD-10-CM | POA: Diagnosis not present

## 2016-07-03 DIAGNOSIS — L98499 Non-pressure chronic ulcer of skin of other sites with unspecified severity: Secondary | ICD-10-CM | POA: Diagnosis not present

## 2016-07-06 DIAGNOSIS — S31109A Unspecified open wound of abdominal wall, unspecified quadrant without penetration into peritoneal cavity, initial encounter: Secondary | ICD-10-CM | POA: Diagnosis not present

## 2016-07-06 DIAGNOSIS — M792 Neuralgia and neuritis, unspecified: Secondary | ICD-10-CM | POA: Diagnosis not present

## 2016-07-06 DIAGNOSIS — E119 Type 2 diabetes mellitus without complications: Secondary | ICD-10-CM | POA: Diagnosis not present

## 2016-07-06 DIAGNOSIS — B351 Tinea unguium: Secondary | ICD-10-CM | POA: Diagnosis not present

## 2016-07-24 DIAGNOSIS — Z7901 Long term (current) use of anticoagulants: Secondary | ICD-10-CM | POA: Diagnosis not present

## 2016-07-24 DIAGNOSIS — E113293 Type 2 diabetes mellitus with mild nonproliferative diabetic retinopathy without macular edema, bilateral: Secondary | ICD-10-CM | POA: Diagnosis not present

## 2016-07-24 DIAGNOSIS — Z7984 Long term (current) use of oral hypoglycemic drugs: Secondary | ICD-10-CM | POA: Diagnosis not present

## 2016-07-24 DIAGNOSIS — H524 Presbyopia: Secondary | ICD-10-CM | POA: Diagnosis not present

## 2016-07-24 DIAGNOSIS — H2513 Age-related nuclear cataract, bilateral: Secondary | ICD-10-CM | POA: Diagnosis not present

## 2016-07-24 DIAGNOSIS — L98499 Non-pressure chronic ulcer of skin of other sites with unspecified severity: Secondary | ICD-10-CM | POA: Diagnosis not present

## 2016-07-31 DIAGNOSIS — L98499 Non-pressure chronic ulcer of skin of other sites with unspecified severity: Secondary | ICD-10-CM | POA: Diagnosis not present

## 2016-08-07 DIAGNOSIS — L98499 Non-pressure chronic ulcer of skin of other sites with unspecified severity: Secondary | ICD-10-CM | POA: Diagnosis not present

## 2016-08-14 DIAGNOSIS — L97919 Non-pressure chronic ulcer of unspecified part of right lower leg with unspecified severity: Secondary | ICD-10-CM | POA: Diagnosis not present

## 2016-08-14 DIAGNOSIS — L98499 Non-pressure chronic ulcer of skin of other sites with unspecified severity: Secondary | ICD-10-CM | POA: Diagnosis not present

## 2016-08-17 DIAGNOSIS — M792 Neuralgia and neuritis, unspecified: Secondary | ICD-10-CM | POA: Diagnosis not present

## 2016-08-17 DIAGNOSIS — B351 Tinea unguium: Secondary | ICD-10-CM | POA: Diagnosis not present

## 2016-08-24 DIAGNOSIS — L98499 Non-pressure chronic ulcer of skin of other sites with unspecified severity: Secondary | ICD-10-CM | POA: Diagnosis not present

## 2016-08-28 DIAGNOSIS — L98499 Non-pressure chronic ulcer of skin of other sites with unspecified severity: Secondary | ICD-10-CM | POA: Diagnosis not present

## 2016-09-08 DIAGNOSIS — L98499 Non-pressure chronic ulcer of skin of other sites with unspecified severity: Secondary | ICD-10-CM | POA: Diagnosis not present

## 2016-09-14 DIAGNOSIS — B351 Tinea unguium: Secondary | ICD-10-CM | POA: Diagnosis not present

## 2016-09-14 DIAGNOSIS — M792 Neuralgia and neuritis, unspecified: Secondary | ICD-10-CM | POA: Diagnosis not present

## 2016-10-12 DIAGNOSIS — B351 Tinea unguium: Secondary | ICD-10-CM | POA: Diagnosis not present

## 2016-10-12 DIAGNOSIS — M792 Neuralgia and neuritis, unspecified: Secondary | ICD-10-CM | POA: Diagnosis not present

## 2016-10-16 DIAGNOSIS — L98499 Non-pressure chronic ulcer of skin of other sites with unspecified severity: Secondary | ICD-10-CM | POA: Diagnosis not present

## 2016-10-26 DIAGNOSIS — I2699 Other pulmonary embolism without acute cor pulmonale: Secondary | ICD-10-CM | POA: Diagnosis not present

## 2016-10-26 DIAGNOSIS — I10 Essential (primary) hypertension: Secondary | ICD-10-CM | POA: Diagnosis not present

## 2016-10-26 DIAGNOSIS — J449 Chronic obstructive pulmonary disease, unspecified: Secondary | ICD-10-CM | POA: Diagnosis not present

## 2016-10-27 DIAGNOSIS — L98499 Non-pressure chronic ulcer of skin of other sites with unspecified severity: Secondary | ICD-10-CM | POA: Diagnosis not present

## 2016-11-03 DIAGNOSIS — L98499 Non-pressure chronic ulcer of skin of other sites with unspecified severity: Secondary | ICD-10-CM | POA: Diagnosis not present

## 2016-11-06 DIAGNOSIS — L98499 Non-pressure chronic ulcer of skin of other sites with unspecified severity: Secondary | ICD-10-CM | POA: Diagnosis not present

## 2016-11-20 DIAGNOSIS — D649 Anemia, unspecified: Secondary | ICD-10-CM | POA: Diagnosis not present

## 2016-11-20 DIAGNOSIS — I10 Essential (primary) hypertension: Secondary | ICD-10-CM | POA: Diagnosis not present

## 2016-11-24 DIAGNOSIS — L98499 Non-pressure chronic ulcer of skin of other sites with unspecified severity: Secondary | ICD-10-CM | POA: Diagnosis not present

## 2016-12-01 DIAGNOSIS — L98499 Non-pressure chronic ulcer of skin of other sites with unspecified severity: Secondary | ICD-10-CM | POA: Diagnosis not present

## 2016-12-04 DIAGNOSIS — L98499 Non-pressure chronic ulcer of skin of other sites with unspecified severity: Secondary | ICD-10-CM | POA: Diagnosis not present

## 2016-12-05 DIAGNOSIS — E1151 Type 2 diabetes mellitus with diabetic peripheral angiopathy without gangrene: Secondary | ICD-10-CM | POA: Diagnosis not present

## 2016-12-11 DIAGNOSIS — L98499 Non-pressure chronic ulcer of skin of other sites with unspecified severity: Secondary | ICD-10-CM | POA: Diagnosis not present

## 2016-12-12 DIAGNOSIS — I679 Cerebrovascular disease, unspecified: Secondary | ICD-10-CM | POA: Diagnosis not present

## 2016-12-12 DIAGNOSIS — I10 Essential (primary) hypertension: Secondary | ICD-10-CM | POA: Diagnosis not present

## 2016-12-12 DIAGNOSIS — Z8673 Personal history of transient ischemic attack (TIA), and cerebral infarction without residual deficits: Secondary | ICD-10-CM | POA: Diagnosis not present

## 2016-12-18 DIAGNOSIS — L98499 Non-pressure chronic ulcer of skin of other sites with unspecified severity: Secondary | ICD-10-CM | POA: Diagnosis not present

## 2016-12-25 DIAGNOSIS — L98499 Non-pressure chronic ulcer of skin of other sites with unspecified severity: Secondary | ICD-10-CM | POA: Diagnosis not present

## 2017-01-01 DIAGNOSIS — L98499 Non-pressure chronic ulcer of skin of other sites with unspecified severity: Secondary | ICD-10-CM | POA: Diagnosis not present

## 2017-01-04 DIAGNOSIS — M792 Neuralgia and neuritis, unspecified: Secondary | ICD-10-CM | POA: Diagnosis not present

## 2017-01-04 DIAGNOSIS — B351 Tinea unguium: Secondary | ICD-10-CM | POA: Diagnosis not present

## 2017-01-08 DIAGNOSIS — L98499 Non-pressure chronic ulcer of skin of other sites with unspecified severity: Secondary | ICD-10-CM | POA: Diagnosis not present

## 2017-01-15 DIAGNOSIS — D649 Anemia, unspecified: Secondary | ICD-10-CM | POA: Diagnosis not present

## 2017-01-15 DIAGNOSIS — Z5181 Encounter for therapeutic drug level monitoring: Secondary | ICD-10-CM | POA: Diagnosis not present

## 2017-01-16 DIAGNOSIS — Z79899 Other long term (current) drug therapy: Secondary | ICD-10-CM | POA: Diagnosis not present

## 2017-01-16 DIAGNOSIS — R319 Hematuria, unspecified: Secondary | ICD-10-CM | POA: Diagnosis not present

## 2017-01-16 DIAGNOSIS — R509 Fever, unspecified: Secondary | ICD-10-CM | POA: Diagnosis not present

## 2017-01-16 DIAGNOSIS — N390 Urinary tract infection, site not specified: Secondary | ICD-10-CM | POA: Diagnosis not present

## 2017-01-16 DIAGNOSIS — D649 Anemia, unspecified: Secondary | ICD-10-CM | POA: Diagnosis not present

## 2017-01-16 DIAGNOSIS — R110 Nausea: Secondary | ICD-10-CM | POA: Diagnosis not present

## 2017-01-22 DIAGNOSIS — Z7901 Long term (current) use of anticoagulants: Secondary | ICD-10-CM | POA: Diagnosis not present

## 2017-01-22 DIAGNOSIS — H2513 Age-related nuclear cataract, bilateral: Secondary | ICD-10-CM | POA: Diagnosis not present

## 2017-01-22 DIAGNOSIS — E119 Type 2 diabetes mellitus without complications: Secondary | ICD-10-CM | POA: Diagnosis not present

## 2017-01-22 DIAGNOSIS — Z7984 Long term (current) use of oral hypoglycemic drugs: Secondary | ICD-10-CM | POA: Diagnosis not present

## 2017-01-25 DIAGNOSIS — D649 Anemia, unspecified: Secondary | ICD-10-CM | POA: Diagnosis not present

## 2017-01-25 DIAGNOSIS — Z79899 Other long term (current) drug therapy: Secondary | ICD-10-CM | POA: Diagnosis not present

## 2017-01-25 DIAGNOSIS — N390 Urinary tract infection, site not specified: Secondary | ICD-10-CM | POA: Diagnosis not present

## 2017-01-25 DIAGNOSIS — R8279 Other abnormal findings on microbiological examination of urine: Secondary | ICD-10-CM | POA: Diagnosis not present

## 2017-01-25 DIAGNOSIS — R110 Nausea: Secondary | ICD-10-CM | POA: Diagnosis not present

## 2017-01-31 DIAGNOSIS — N390 Urinary tract infection, site not specified: Secondary | ICD-10-CM | POA: Diagnosis not present

## 2017-01-31 DIAGNOSIS — R110 Nausea: Secondary | ICD-10-CM | POA: Diagnosis not present

## 2017-02-07 DIAGNOSIS — I10 Essential (primary) hypertension: Secondary | ICD-10-CM | POA: Diagnosis not present

## 2017-02-07 DIAGNOSIS — E1122 Type 2 diabetes mellitus with diabetic chronic kidney disease: Secondary | ICD-10-CM | POA: Diagnosis not present

## 2017-02-07 DIAGNOSIS — N183 Chronic kidney disease, stage 3 (moderate): Secondary | ICD-10-CM | POA: Diagnosis not present

## 2017-02-07 DIAGNOSIS — J449 Chronic obstructive pulmonary disease, unspecified: Secondary | ICD-10-CM | POA: Diagnosis not present

## 2017-02-12 DIAGNOSIS — L98499 Non-pressure chronic ulcer of skin of other sites with unspecified severity: Secondary | ICD-10-CM | POA: Diagnosis not present

## 2017-02-15 DIAGNOSIS — E559 Vitamin D deficiency, unspecified: Secondary | ICD-10-CM | POA: Diagnosis not present

## 2017-02-15 DIAGNOSIS — D649 Anemia, unspecified: Secondary | ICD-10-CM | POA: Diagnosis not present

## 2017-02-15 DIAGNOSIS — E785 Hyperlipidemia, unspecified: Secondary | ICD-10-CM | POA: Diagnosis not present

## 2017-02-15 DIAGNOSIS — D519 Vitamin B12 deficiency anemia, unspecified: Secondary | ICD-10-CM | POA: Diagnosis not present

## 2017-02-15 DIAGNOSIS — E119 Type 2 diabetes mellitus without complications: Secondary | ICD-10-CM | POA: Diagnosis not present

## 2017-02-15 DIAGNOSIS — Z5181 Encounter for therapeutic drug level monitoring: Secondary | ICD-10-CM | POA: Diagnosis not present

## 2017-02-15 DIAGNOSIS — M6281 Muscle weakness (generalized): Secondary | ICD-10-CM | POA: Diagnosis not present

## 2017-02-15 DIAGNOSIS — E0829 Diabetes mellitus due to underlying condition with other diabetic kidney complication: Secondary | ICD-10-CM | POA: Diagnosis not present

## 2017-02-15 DIAGNOSIS — I10 Essential (primary) hypertension: Secondary | ICD-10-CM | POA: Diagnosis not present

## 2017-02-19 DIAGNOSIS — L98499 Non-pressure chronic ulcer of skin of other sites with unspecified severity: Secondary | ICD-10-CM | POA: Diagnosis not present

## 2017-02-22 DIAGNOSIS — E1151 Type 2 diabetes mellitus with diabetic peripheral angiopathy without gangrene: Secondary | ICD-10-CM | POA: Diagnosis not present

## 2017-03-05 DIAGNOSIS — L98499 Non-pressure chronic ulcer of skin of other sites with unspecified severity: Secondary | ICD-10-CM | POA: Diagnosis not present

## 2017-03-19 DIAGNOSIS — Z7189 Other specified counseling: Secondary | ICD-10-CM | POA: Diagnosis not present

## 2017-03-19 DIAGNOSIS — N183 Chronic kidney disease, stage 3 (moderate): Secondary | ICD-10-CM | POA: Diagnosis not present

## 2017-03-19 DIAGNOSIS — E1122 Type 2 diabetes mellitus with diabetic chronic kidney disease: Secondary | ICD-10-CM | POA: Diagnosis not present

## 2017-04-09 DIAGNOSIS — L98499 Non-pressure chronic ulcer of skin of other sites with unspecified severity: Secondary | ICD-10-CM | POA: Diagnosis not present

## 2017-04-17 DIAGNOSIS — Z5181 Encounter for therapeutic drug level monitoring: Secondary | ICD-10-CM | POA: Diagnosis not present

## 2017-04-17 DIAGNOSIS — D649 Anemia, unspecified: Secondary | ICD-10-CM | POA: Diagnosis not present

## 2017-04-18 DIAGNOSIS — L97119 Non-pressure chronic ulcer of right thigh with unspecified severity: Secondary | ICD-10-CM | POA: Diagnosis not present

## 2018-07-24 ENCOUNTER — Inpatient Hospital Stay (HOSPITAL_COMMUNITY)
Admission: EM | Admit: 2018-07-24 | Discharge: 2018-07-26 | DRG: 065 | Disposition: A | Discharge status: Skilled Nursing Facility | Payer: Medicare Other | Source: Skilled Nursing Facility | Attending: Pulmonary Disease | Admitting: Pulmonary Disease

## 2018-07-24 ENCOUNTER — Inpatient Hospital Stay (HOSPITAL_COMMUNITY): Payer: Medicare Other

## 2018-07-24 ENCOUNTER — Encounter (HOSPITAL_COMMUNITY): Payer: Self-pay | Admitting: Emergency Medicine

## 2018-07-24 ENCOUNTER — Other Ambulatory Visit (HOSPITAL_COMMUNITY): Payer: Medicare Other

## 2018-07-24 ENCOUNTER — Emergency Department (HOSPITAL_COMMUNITY): Payer: Medicare Other

## 2018-07-24 ENCOUNTER — Other Ambulatory Visit: Payer: Self-pay

## 2018-07-24 DIAGNOSIS — Z881 Allergy status to other antibiotic agents status: Secondary | ICD-10-CM | POA: Diagnosis not present

## 2018-07-24 DIAGNOSIS — Z8673 Personal history of transient ischemic attack (TIA), and cerebral infarction without residual deficits: Secondary | ICD-10-CM | POA: Diagnosis not present

## 2018-07-24 DIAGNOSIS — Z7901 Long term (current) use of anticoagulants: Secondary | ICD-10-CM | POA: Diagnosis not present

## 2018-07-24 DIAGNOSIS — N183 Chronic kidney disease, stage 3 (moderate): Secondary | ICD-10-CM | POA: Diagnosis present

## 2018-07-24 DIAGNOSIS — Z6841 Body Mass Index (BMI) 40.0 and over, adult: Secondary | ICD-10-CM | POA: Diagnosis not present

## 2018-07-24 DIAGNOSIS — Z66 Do not resuscitate: Secondary | ICD-10-CM | POA: Diagnosis present

## 2018-07-24 DIAGNOSIS — I340 Nonrheumatic mitral (valve) insufficiency: Secondary | ICD-10-CM | POA: Diagnosis not present

## 2018-07-24 DIAGNOSIS — I63519 Cerebral infarction due to unspecified occlusion or stenosis of unspecified middle cerebral artery: Secondary | ICD-10-CM | POA: Diagnosis present

## 2018-07-24 DIAGNOSIS — R471 Dysarthria and anarthria: Secondary | ICD-10-CM | POA: Diagnosis present

## 2018-07-24 DIAGNOSIS — Z993 Dependence on wheelchair: Secondary | ICD-10-CM | POA: Diagnosis not present

## 2018-07-24 DIAGNOSIS — E1140 Type 2 diabetes mellitus with diabetic neuropathy, unspecified: Secondary | ICD-10-CM | POA: Diagnosis present

## 2018-07-24 DIAGNOSIS — Z20828 Contact with and (suspected) exposure to other viral communicable diseases: Secondary | ICD-10-CM | POA: Diagnosis present

## 2018-07-24 DIAGNOSIS — Z86711 Personal history of pulmonary embolism: Secondary | ICD-10-CM

## 2018-07-24 DIAGNOSIS — E1122 Type 2 diabetes mellitus with diabetic chronic kidney disease: Secondary | ICD-10-CM | POA: Diagnosis present

## 2018-07-24 DIAGNOSIS — Z833 Family history of diabetes mellitus: Secondary | ICD-10-CM

## 2018-07-24 DIAGNOSIS — I129 Hypertensive chronic kidney disease with stage 1 through stage 4 chronic kidney disease, or unspecified chronic kidney disease: Secondary | ICD-10-CM | POA: Diagnosis present

## 2018-07-24 DIAGNOSIS — I10 Essential (primary) hypertension: Secondary | ICD-10-CM | POA: Diagnosis present

## 2018-07-24 DIAGNOSIS — Z8249 Family history of ischemic heart disease and other diseases of the circulatory system: Secondary | ICD-10-CM

## 2018-07-24 DIAGNOSIS — I639 Cerebral infarction, unspecified: Secondary | ICD-10-CM | POA: Diagnosis not present

## 2018-07-24 DIAGNOSIS — R269 Unspecified abnormalities of gait and mobility: Secondary | ICD-10-CM | POA: Diagnosis present

## 2018-07-24 DIAGNOSIS — Z7982 Long term (current) use of aspirin: Secondary | ICD-10-CM | POA: Diagnosis not present

## 2018-07-24 DIAGNOSIS — M1990 Unspecified osteoarthritis, unspecified site: Secondary | ICD-10-CM | POA: Diagnosis present

## 2018-07-24 DIAGNOSIS — R4781 Slurred speech: Secondary | ICD-10-CM | POA: Diagnosis present

## 2018-07-24 DIAGNOSIS — E119 Type 2 diabetes mellitus without complications: Secondary | ICD-10-CM

## 2018-07-24 DIAGNOSIS — Z7401 Bed confinement status: Secondary | ICD-10-CM

## 2018-07-24 DIAGNOSIS — R29810 Facial weakness: Secondary | ICD-10-CM | POA: Diagnosis present

## 2018-07-24 LAB — I-STAT CHEM 8, ED
BUN: 19 mg/dL (ref 8–23)
Calcium, Ion: 1.12 mmol/L — ABNORMAL LOW (ref 1.15–1.40)
Chloride: 108 mmol/L (ref 98–111)
Creatinine, Ser: 1.4 mg/dL — ABNORMAL HIGH (ref 0.44–1.00)
Glucose, Bld: 110 mg/dL — ABNORMAL HIGH (ref 70–99)
HCT: 40 % (ref 36.0–46.0)
Hemoglobin: 13.6 g/dL (ref 12.0–15.0)
Potassium: 4 mmol/L (ref 3.5–5.1)
Sodium: 143 mmol/L (ref 135–145)
TCO2: 27 mmol/L (ref 22–32)

## 2018-07-24 LAB — GLUCOSE, CAPILLARY
Glucose-Capillary: 88 mg/dL (ref 70–99)
Glucose-Capillary: 89 mg/dL (ref 70–99)

## 2018-07-24 LAB — URINALYSIS, ROUTINE W REFLEX MICROSCOPIC
Bilirubin Urine: NEGATIVE
Glucose, UA: NEGATIVE mg/dL
Hgb urine dipstick: NEGATIVE
Ketones, ur: 5 mg/dL — AB
Nitrite: NEGATIVE
Protein, ur: NEGATIVE mg/dL
Specific Gravity, Urine: 1.014 (ref 1.005–1.030)
pH: 5 (ref 5.0–8.0)

## 2018-07-24 LAB — COMPREHENSIVE METABOLIC PANEL
ALT: 11 U/L (ref 0–44)
AST: 11 U/L — ABNORMAL LOW (ref 15–41)
Albumin: 3.4 g/dL — ABNORMAL LOW (ref 3.5–5.0)
Alkaline Phosphatase: 50 U/L (ref 38–126)
Anion gap: 11 (ref 5–15)
BUN: 20 mg/dL (ref 8–23)
CO2: 25 mmol/L (ref 22–32)
Calcium: 8.7 mg/dL — ABNORMAL LOW (ref 8.9–10.3)
Chloride: 107 mmol/L (ref 98–111)
Creatinine, Ser: 1.38 mg/dL — ABNORMAL HIGH (ref 0.44–1.00)
GFR calc Af Amer: 45 mL/min — ABNORMAL LOW (ref >60)
GFR calc non Af Amer: 39 mL/min — ABNORMAL LOW (ref >60)
Glucose, Bld: 113 mg/dL — ABNORMAL HIGH (ref 70–99)
Potassium: 3.9 mmol/L (ref 3.5–5.1)
Sodium: 143 mmol/L (ref 135–145)
Total Bilirubin: 0.4 mg/dL (ref 0.3–1.2)
Total Protein: 7.3 g/dL (ref 6.5–8.1)

## 2018-07-24 LAB — DIFFERENTIAL
Abs Immature Granulocytes: 0.03 10*3/uL (ref 0.00–0.07)
Basophils Absolute: 0 10*3/uL (ref 0.0–0.1)
Basophils Relative: 0 %
Eosinophils Absolute: 0.2 10*3/uL (ref 0.0–0.5)
Eosinophils Relative: 2 %
Immature Granulocytes: 0 %
Lymphocytes Relative: 37 %
Lymphs Abs: 3 10*3/uL (ref 0.7–4.0)
Monocytes Absolute: 0.5 10*3/uL (ref 0.1–1.0)
Monocytes Relative: 6 %
Neutro Abs: 4.5 10*3/uL (ref 1.7–7.7)
Neutrophils Relative %: 55 %

## 2018-07-24 LAB — RAPID URINE DRUG SCREEN, HOSP PERFORMED
Amphetamines: NOT DETECTED
Barbiturates: NOT DETECTED
Benzodiazepines: NOT DETECTED
Cocaine: NOT DETECTED
Opiates: NOT DETECTED
Tetrahydrocannabinol: NOT DETECTED

## 2018-07-24 LAB — CBC
HCT: 40.4 % (ref 36.0–46.0)
Hemoglobin: 11.8 g/dL — ABNORMAL LOW (ref 12.0–15.0)
MCH: 25.7 pg — ABNORMAL LOW (ref 26.0–34.0)
MCHC: 29.2 g/dL — ABNORMAL LOW (ref 30.0–36.0)
MCV: 88 fL (ref 80.0–100.0)
Platelets: 267 10*3/uL (ref 150–400)
RBC: 4.59 MIL/uL (ref 3.87–5.11)
RDW: 15.1 % (ref 11.5–15.5)
WBC: 8.2 10*3/uL (ref 4.0–10.5)
nRBC: 0 % (ref 0.0–0.2)

## 2018-07-24 LAB — RETICULOCYTES
Immature Retic Fract: 13.9 % (ref 2.3–15.9)
RBC.: 4.52 MIL/uL (ref 3.87–5.11)
Retic Count, Absolute: 34.8 10*3/uL (ref 19.0–186.0)
Retic Ct Pct: 0.8 % (ref 0.4–3.1)

## 2018-07-24 LAB — PROTIME-INR
INR: 1.2 (ref 0.8–1.2)
Prothrombin Time: 15.4 seconds — ABNORMAL HIGH (ref 11.4–15.2)

## 2018-07-24 LAB — SARS CORONAVIRUS 2 BY RT PCR (HOSPITAL ORDER, PERFORMED IN ~~LOC~~ HOSPITAL LAB): SARS Coronavirus 2: NEGATIVE

## 2018-07-24 LAB — IRON AND TIBC
Iron: 35 ug/dL (ref 28–170)
Saturation Ratios: 15 % (ref 10.4–31.8)
TIBC: 239 ug/dL — ABNORMAL LOW (ref 250–450)
UIBC: 204 ug/dL

## 2018-07-24 LAB — FERRITIN: Ferritin: 110 ng/mL (ref 11–307)

## 2018-07-24 LAB — ETHANOL: Alcohol, Ethyl (B): <10 mg/dL (ref <10)

## 2018-07-24 LAB — CBG MONITORING, ED: Glucose-Capillary: 109 mg/dL — ABNORMAL HIGH (ref 70–99)

## 2018-07-24 LAB — VITAMIN B12: Vitamin B-12: 1078 pg/mL — ABNORMAL HIGH (ref 180–914)

## 2018-07-24 LAB — FOLATE: Folate: 13.4 ng/mL (ref >5.9)

## 2018-07-24 LAB — APTT: aPTT: 28 seconds (ref 24–36)

## 2018-07-24 MED ORDER — ACETAMINOPHEN 650 MG RE SUPP: 650.0000 mg | RECTAL | Status: DC | PRN

## 2018-07-24 MED ORDER — SENNA 8.6 MG PO TABS: 1.0000 | ORAL_TABLET | Freq: Every day | ORAL | Status: DC | PRN

## 2018-07-24 MED ORDER — HYDRALAZINE HCL 20 MG/ML IJ SOLN: 10.0000 mg | INTRAMUSCULAR | Status: DC | PRN

## 2018-07-24 MED ORDER — RIVAROXABAN 20 MG PO TABS
20.0000 mg | ORAL_TABLET | Freq: Every day | ORAL | Status: DC
  Administered 2018-07-24: 19:00:00 20 mg via ORAL
  Filled 2018-07-24: qty 1

## 2018-07-24 MED ORDER — CITALOPRAM HYDROBROMIDE 20 MG PO TABS
20.0000 mg | ORAL_TABLET | Freq: Every day | ORAL | Status: DC
  Administered 2018-07-25 – 2018-07-26 (×2): 20 mg via ORAL
  Filled 2018-07-24 (×2): qty 1

## 2018-07-24 MED ORDER — STROKE: EARLY STAGES OF RECOVERY BOOK
Freq: Once | Status: DC
  Filled 2018-07-24: qty 1

## 2018-07-24 MED ORDER — RIVAROXABAN 15 MG PO TABS: 15.0000 mg | ORAL_TABLET | Freq: Every day | ORAL | Status: DC

## 2018-07-24 MED ORDER — INSULIN ASPART 100 UNIT/ML ~~LOC~~ SOLN: 0.0000 [IU] | Freq: Three times a day (TID) | SUBCUTANEOUS | Status: DC

## 2018-07-24 MED ORDER — OXYCODONE-ACETAMINOPHEN 5-325 MG PO TABS: 1.0000 | ORAL_TABLET | Freq: Four times a day (QID) | ORAL | Status: DC | PRN

## 2018-07-24 MED ORDER — SIMVASTATIN 20 MG PO TABS: 40.0000 mg | ORAL_TABLET | Freq: Every day | ORAL | Status: DC

## 2018-07-24 MED ORDER — CELECOXIB 100 MG PO CAPS
200.0000 mg | ORAL_CAPSULE | Freq: Every day | ORAL | Status: DC
  Administered 2018-07-25 – 2018-07-26 (×2): 200 mg via ORAL
  Filled 2018-07-24 (×2): qty 2

## 2018-07-24 MED ORDER — VERAPAMIL HCL ER 240 MG PO TBCR
240.0000 mg | EXTENDED_RELEASE_TABLET | Freq: Every day | ORAL | Status: DC
  Administered 2018-07-25 – 2018-07-26 (×2): 240 mg via ORAL
  Filled 2018-07-24 (×2): qty 1

## 2018-07-24 MED ORDER — SODIUM CHLORIDE 0.9 % IV SOLN
INTRAVENOUS | Status: AC
  Administered 2018-07-24: 19:00:00 via INTRAVENOUS

## 2018-07-24 MED ORDER — INSULIN ASPART 100 UNIT/ML ~~LOC~~ SOLN: 0.0000 [IU] | Freq: Every day | SUBCUTANEOUS | Status: DC

## 2018-07-24 MED ORDER — ADULT MULTIVITAMIN W/MINERALS CH
1.0000 | ORAL_TABLET | Freq: Every day | ORAL | Status: DC
  Administered 2018-07-25 – 2018-07-26 (×2): 1 via ORAL
  Filled 2018-07-24 (×2): qty 1

## 2018-07-24 MED ORDER — ACETAMINOPHEN 160 MG/5ML PO SOLN: 650.0000 mg | ORAL | Status: DC | PRN

## 2018-07-24 MED ORDER — ACETAMINOPHEN 325 MG PO TABS: 650.0000 mg | ORAL_TABLET | ORAL | Status: DC | PRN

## 2018-07-24 MED ORDER — ASPIRIN 81 MG PO CHEW
81.0000 mg | CHEWABLE_TABLET | Freq: Every day | ORAL | Status: DC
  Administered 2018-07-25 – 2018-07-26 (×2): 81 mg via ORAL
  Filled 2018-07-24 (×2): qty 1

## 2018-07-24 MED ORDER — ATORVASTATIN CALCIUM 20 MG PO TABS
20.0000 mg | ORAL_TABLET | Freq: Every day | ORAL | Status: DC
  Administered 2018-07-24 – 2018-07-25 (×2): 20 mg via ORAL
  Filled 2018-07-24 (×2): qty 1

## 2018-07-24 NOTE — ED Provider Notes (Signed)
[facility] EMERGENCY DEPARTMENT Provider Note   CSN: [id number] Arrival date & time: 07/24/18  0813    History   Chief Complaint Chief Complaint  Patient presents with   Cerebrovascular Accident    HPI Dorothey L Costabile is a 68 y.o. female.     The history is provided by the patient, the nursing home and the EMS personnel. History limited by: slurred speech, urgent need for intervention.  Cerebrovascular Accident   Pt was seen at [facility]. Per EMS, NH report and pt: Pt states she "was normal" when she went to be last night, and again when she initially awoke at 0600 "to get changed by the Nursing Tech." Pt states her speech "was fine." Pt states she went back to sleep. NH states RN came to check on her at 3 and pt's "speech was slurred" and "face was drooping." Pt states she "is ok" otherwise. Pt does not walk or stand per baseline. Denies visual changes, no focal motor weakness, no tingling/numbness in extremities, no ataxia. Denies CP/palpitations, no SOB/cough, no fevers, no abd pain, no N/V/D, no back pain, no injury.    Past Medical History:  Diagnosis Date   Cellulitis lower extremities   4 day hospitalization 12/25/14-12/29/14   Essential hypertension, benign    History of MRSA infection    History of panniculitis    Pulmonary emboli ([place]) 2009   Skin ulcer of thigh ([place])    Type 2 diabetes mellitus ([place])     Patient Active Problem List   Diagnosis Date Noted   Depression 04/12/2015   TIA (transient ischemic attack) 04/09/2015   Aphasia 04/09/2015   Cellulitis and abscess of leg 04/09/2015   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   Fever, unspecified 12/25/2014   Sepsis ([place]) 12/25/2014   Cellulitis 12/25/2014   Essential hypertension, benign 12/20/2011   History of pulmonary embolus (PE) 12/20/2011   Morbid obesity ([place]) 12/20/2011   Preoperative cardiovascular examination 12/20/2011   Type 2 diabetes mellitus ([place]) 09/11/2006    Past Surgical  History:  Procedure Laterality Date   PARTIAL HYSTERECTOMY     Right thigh ulcer debridement  2008   Two occasions - wound VAC     OB History    Gravida      Para      Term      Preterm      AB      Living  1     SAB      TAB      Ectopic      Multiple      Live Births               Home Medications    Prior to Admission medications   Medication Sig Start Date End Date Taking? Authorizing Provider  ammonium lactate (LAC-HYDRIN) 12 % lotion Apply topically 2 (two) times daily. 04/13/15   Sinda Du, MD  aspirin 81 MG chewable tablet Chew 1 tablet (81 mg total) by mouth daily. 04/13/15   Sinda Du, MD  CELEBREX 200 MG capsule Take 200 mg by mouth daily.  10/13/11   [provider]  citalopram (CELEXA) 20 MG tablet Take 1 tablet (20 mg total) by mouth daily. 04/13/15   Sinda Du, MD  Control Gel Formula Dressing (DUODERM CGF DRESSING EX) Apply 1 patch topically daily. APPLIED TO AFFECTED BUTTOCKS AREA    [provider]  glipiZIDE-metformin (METAGLIP) 5-500 MG tablet Take 1 tablet by mouth 2 (two) times daily before a  meal.    [provider]  lisinopril (PRINIVIL,ZESTRIL) 20 MG tablet Take 1 tablet (20 mg total) by mouth daily. 04/13/15   Sinda Du, MD  Multiple Vitamin (MULTIVITAMIN WITH MINERALS) TABS tablet Take 1 tablet by mouth daily. *CENTRUM SILVER    [provider]  oxyCODONE-acetaminophen (PERCOCET/ROXICET) 5-325 MG per tablet Take 1 tablet by mouth 4 (four) times daily.  11/23/11   [provider]  senna (SENOKOT) 8.6 MG TABS tablet Take 1 tablet (8.6 mg total) by mouth daily as needed for mild constipation. 04/13/15   Sinda Du, MD  sulfamethoxazole-trimethoprim (BACTRIM DS,SEPTRA DS) 800-160 MG tablet Take 1 tablet by mouth 2 (two) times daily. 04/13/15   Sinda Du, MD  verapamil (CALAN-SR) 240 MG CR tablet Take 240 mg by mouth daily.  10/24/11   [provider]  XARELTO 20  MG TABS Take 20 mg by mouth daily with supper.  11/29/11   [provider]    Family History Family History  Problem Relation Age of Onset   Cancer - Colon Father    Heart disease Mother    Diabetes Mellitus II Mother     Social History Social History   Tobacco Use   Smoking status: Never Smoker   Smokeless tobacco: Never Used  Substance Use Topics   Alcohol use: No   Drug use: No     Allergies   Vancomycin   Review of Systems Review of Systems  Unable to perform ROS: Other     Physical Exam Updated Vital Signs BP (!) 208/99    Pulse (!) 101    Resp 18    Ht 5' (1.524 m)    Wt 121.8 kg    SpO2 97%    BMI 52.44 kg/m    Patient Vitals for the past 24 hrs:  BP Pulse Resp SpO2 Height Weight  07/24/18 0845 -- (!) 103 (!) 23 100 % -- --  07/24/18 0840 -- (!) 101 18 97 % -- --  07/24/18 0836 -- -- -- -- 5' (1.524 m) 121.8 kg  07/24/18 0830 (!) 208/99 98 20 97 % -- --  07/24/18 0826 (!) 205/117 (!) 106 20 97 % -- --  07/24/18 0820 (!) 228/86 99 (!) 21 99 % -- --     Physical Exam 0830: Physical examination:  Nursing notes reviewed; Vital signs and O2 SAT reviewed;  Constitutional: Well developed, Well nourished, Well hydrated, In no acute distress; Head:  Normocephalic, atraumatic; Eyes: EOMI, PERRL, No scleral icterus; ENMT: Mouth and pharynx normal, Mucous membranes moist; Neck: Supple, Full range of motion, No lymphadenopathy; Cardiovascular: Regular rate and rhythm, No gallop; Respiratory: Breath sounds clear & equal bilaterally, No wheezes.  Speaking full sentences with ease, Normal respiratory effort/excursion; Chest: Nontender, Movement normal; Abdomen: Soft, Nontender, Nondistended, Normal bowel sounds; Genitourinary: No CVA tenderness; Extremities: Peripheral pulses normal, No tenderness, +1 pedal edema bilat. No calf edema or asymmetry.; Neuro: AA&Ox3, Major CN grossly intact. Speech slurred.  +flattened NLF on right.  No nystagmus. Grips equal.  Strength 5/5 equal bilat UE's and LE's. No gross sensory deficits.  Normal cerebellar testing bilat UE's (finger-nose) and LE's (heel-shin)..; Skin: Color normal, Warm, Dry.     ED Treatments / Results  Labs (all labs ordered are listed, but only abnormal results are displayed)   EKG EKG Interpretation  Date/Time:  Wednesday July 24 2018 08:18:46 EDT Ventricular Rate:  97 PR Interval:    QRS Duration: 106 QT Interval:  330 QTC Calculation:  16 R Axis:   -15 Text Interpretation:  Sinus rhythm Inferior infarct, old Probable anterior infarct, age indeterminate Nonspecific T wave abnormality Baseline wander Artifact When compared with ECG of 04/09/2015 Nonspecific T wave abnormality is now Present Confirmed by Francine Graven [id number]) on 07/24/2018 8:35:39 AM   Radiology   Procedures Procedures (including critical care time)  Medications Ordered in ED Medications - No data to display   Initial Impression / Assessment and Plan / ED Course  I have reviewed the triage vital signs and the nursing notes.  Pertinent labs & imaging results that were available during my care of the patient were reviewed by me and considered in my medical decision making (see chart for details).     MDM Reviewed: previous chart, nursing note and vitals Reviewed previous: labs and ECG Interpretation: labs, ECG and CT scan Total time providing critical care: 30-74 minutes. This excludes time spent performing separately reportable procedures and services. Consults: admitting MD and neurology   CRITICAL CARE Performed by: Francine Graven Total critical care time: 35 minutes Critical care time was exclusive of separately billable procedures and treating other patients. Critical care was necessary to treat or prevent imminent or life-threatening deterioration. Critical care was time spent personally by me on the following activities: development of treatment plan with patient and/or surrogate as well  as nursing, discussions with consultants, evaluation of patient's response to treatment, examination of patient, obtaining history from patient or surrogate, ordering and performing treatments and interventions, ordering and review of laboratory studies, ordering and review of radiographic studies, pulse oximetry and re-evaluation of patient's condition.   Results for orders placed or performed during the hospital encounter of 07/24/18  Ethanol  Result Value Ref Range   Alcohol, Ethyl (B) <10 <10 mg/dL  Protime-INR  Result Value Ref Range   Prothrombin Time 15.4 (H) 11.4 - 15.2 seconds   INR 1.2 0.8 - 1.2  APTT  Result Value Ref Range   aPTT 28 24 - 36 seconds  CBC  Result Value Ref Range   WBC 8.2 4.0 - 10.5 K/uL   RBC 4.59 3.87 - 5.11 MIL/uL   Hemoglobin 11.8 (L) 12.0 - 15.0 g/dL   HCT 40.4 36.0 - 46.0 %   MCV 88.0 80.0 - 100.0 fL   MCH 25.7 (L) 26.0 - 34.0 pg   MCHC 29.2 (L) 30.0 - 36.0 g/dL   RDW 15.1 11.5 - 15.5 %   Platelets 267 150 - 400 K/uL   nRBC 0.0 0.0 - 0.2 %  Differential  Result Value Ref Range   Neutrophils Relative % 55 %   Neutro Abs 4.5 1.7 - 7.7 K/uL   Lymphocytes Relative 37 %   Lymphs Abs 3.0 0.7 - 4.0 K/uL   Monocytes Relative 6 %   Monocytes Absolute 0.5 0.1 - 1.0 K/uL   Eosinophils Relative 2 %   Eosinophils Absolute 0.2 0.0 - 0.5 K/uL   Basophils Relative 0 %   Basophils Absolute 0.0 0.0 - 0.1 K/uL   Immature Granulocytes 0 %   Abs Immature Granulocytes 0.03 0.00 - 0.07 K/uL  Comprehensive metabolic panel  Result Value Ref Range   Sodium 143 135 - 145 mmol/L   Potassium 3.9 3.5 - 5.1 mmol/L   Chloride 107 98 - 111 mmol/L   CO2 25 22 - 32 mmol/L   Glucose, Bld 113 (H) 70 - 99 mg/dL   BUN 20 8 - 23 mg/dL   Creatinine, Ser 1.38 (H) 0.44 - 1.00  mg/dL   Calcium 8.7 (L) 8.9 - 10.3 mg/dL   Total Protein 7.3 6.5 - 8.1 g/dL   Albumin 3.4 (L) 3.5 - 5.0 g/dL   AST 11 (L) 15 - 41 U/L   ALT 11 0 - 44 U/L   Alkaline Phosphatase 50 38 - 126 U/L    Total Bilirubin 0.4 0.3 - 1.2 mg/dL   GFR calc non Af Amer 39 (L) >60 mL/min   GFR calc Af Amer 45 (L) >60 mL/min   Anion gap 11 5 - 15  CBG monitoring, ED  Result Value Ref Range   Glucose-Capillary 109 (H) 70 - 99 mg/dL  I-stat chem 8, ED  Result Value Ref Range   Sodium 143 135 - 145 mmol/L   Potassium 4.0 3.5 - 5.1 mmol/L   Chloride 108 98 - 111 mmol/L   BUN 19 8 - 23 mg/dL   Creatinine, Ser 1.40 (H) 0.44 - 1.00 mg/dL   Glucose, Bld 110 (H) 70 - 99 mg/dL   Calcium, Ion 1.12 (L) 1.15 - 1.40 mmol/L   TCO2 27 22 - 32 mmol/L   Hemoglobin 13.6 12.0 - 15.0 g/dL   HCT 40.0 36.0 - 46.0 %   Ct Head Code Stroke Wo Contrast Result Date: 07/24/2018 CLINICAL DATA:  Code stroke.  Speech difficulty EXAM: CT HEAD WITHOUT CONTRAST TECHNIQUE: Contiguous axial images were obtained from the base of the skull through the vertex without intravenous contrast. COMPARISON:  04/09/2015 FINDINGS: Brain: Remote appearing lacunar infarcts in the left centrum semiovale. Left thalamic lacunar infarct that is likely chronic based on appearance and history. Small vessel ischemic gliosis in the white matter. New generalized cerebral volume loss. No evidence of acute gray matter infarct, hemorrhage, hydrocephalus, or mass. Vascular: No hyperdense vessel. There is atherosclerotic calcification. Skull: Negative Sinuses/Orbits: Negative Other: These results were called by telephone at the time of interpretation on 07/24/2018 at 8:49 am to Dr. Francine Graven , who verbally acknowledged these results. ASPECTS [facility] Stroke Program Early CT Score) - Ganglionic level infarction (caudate, lentiform nuclei, internal capsule, insula, M1-M3 cortex): 7 - Supraganglionic infarction (M4-M6 cortex): 3 Total score (0-10 with 10 being normal): 10 IMPRESSION: 1. Negative for hemorrhage or acute cortical infarct. ASPECTS is 10. 2. 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  These are new from 2017. 3. Cerebral volume loss since 2017. Electronically Signed   By: Monte Fantasia M.D.   On: 07/24/2018 08:53    Results for Polio, LUCETTE KRATZ (MRN [id number]) as of 07/24/2018 09:27  Ref. Range 02/09/2015 13:20 04/09/2015 11:30 04/11/2015 06:52 07/24/2018 08:32 07/24/2018 08:32  BUN Latest Ref Range: 8 - 23 mg/dL 30 (H) 23 (H) 14 19 20   Creatinine Latest Ref Range: 0.44 - 1.00 mg/dL 1.86 (H) 1.39 (H) 1.11 (H) 1.40 (H) 1.38 (H)     Starla L Mathews was evaluated in Emergency Department on 07/24/2018 for the symptoms described in the history of present illness. She was evaluated in the context of the global COVID-19 pandemic, which necessitated consideration that the patient might be at risk for infection with the SARS-CoV-2 virus that causes COVID-19. Institutional protocols and algorithms that pertain to the evaluation of patients at risk for COVID-19 are in a state of rapid change based on information released by regulatory bodies including the CDC and federal and state organizations. These policies and algorithms were followed during the patient's care in the ED.    [phone]:  Code  Stroke called on pt's arrival. Tele Neuro Dr. Dale Milton has evaluated pt: pt not TPA candidate due to Xarelto (for PE), recommends to continue ASA 81mg  and Xarelto, admit for further stroke workup as well as possible exploration regarding chronic gait abnormality (pt has been at NH x3 years for "rehab because I can't walk"), allow permissive HTN at this time (see full consult note for further details).  0915:  T/C returned from [facility] Dr. Manuella Ghazi, case discussed, including:  HPI, pertinent PM/SHx, VS/PE, dx testing, ED course and treatment, as well as d/w Tele Neuro MD:  Agreeable to admit.     Final Clinical Impressions(s) / ED Diagnoses   Final diagnoses:  None    ED Discharge Orders    None       Francine Graven, DO 07/25/18 1818

## 2018-07-24 NOTE — Plan of Care (Signed)
  Problem: Acute Rehab PT Goals(only PT should resolve) Goal: Pt will Roll Supine to Side Outcome: Progressing Flowsheets (Taken 07/24/2018 1333) Pt will Roll Supine to Side: with min assist Goal: Pt Will Go Supine/Side To Sit Outcome: Progressing Flowsheets (Taken 07/24/2018 1333) Pt will go Supine/Side to Sit: with min guard assist Goal: Pt Will Go Sit To Supine/Side Outcome: Progressing Flowsheets (Taken 07/24/2018 1333) Pt will go Sit to Supine/Side: with min guard assist   1:33 PM, 07/24/18 Lonell Grandchild, MPT Physical Therapist with [facility] [phone] office [phone] mobile phone

## 2018-07-24 NOTE — Consult Note (Signed)
[facility] TeleNeurology Consult Services   TeleStroke Metrics: LKW: 0600 Door Time: 0813 [facility] Contacted: 0973  [facility] at Bedside: 0834 NIHSS: 0845 Decision on Alteplase: Not to give as she is currently on Xarelto.  Xarelto is still on her medication list, and the patient states that she is still taking it. Interventional Candidate: Not a candidate as her symptoms are not consistent with a large vessel proximal occlusion.   Chief Complaint: Right facial droop and slurred speech   HPI: Asked to see this patient in emergent telemedicine consultation utilizing interactive audio and video technologies. ?Consultation was performed with assistance of ancillary / medical staff at bedside.   Verbal consent to perform the examination with telemedicine was obtained. Patient agreed to proceed with the consultation for acute stroke protocol.  68 year old right-handed African-American female who comes to the emergency room for evaluation of right facial droop and slurred speech.  Patient with a history of PE and is currently on Xarelto.  Patient states that she is still taking it.  Patient also has been residing at a skilled nursing facility over the last 3 years due to inability to walk.  She states she is essentially wheelchair-bound.  She has not seen neurology about her walking issues.  Review of the medical record showed that back in February 2017, the patient had a brief episode of word finding difficulties and speech problems.  It looks like MRI imaging was not performed.  She was already on Xarelto at that time.  Apparently, the patient woke up at 6 AM at her baseline.  She went back to sleep.  Then at 7:45 AM, staff noted she had a right facial droop and slurred speech.  She was therefore sent to the ER for further evaluation.   PMH: Diabetes mellitus, hypertension, recurrent cellulitis, history of PE on Xarelto, and prior TIA in February 2017 with symptoms of aphasia    SOC: Negative x3.  Patient is married.   [place]: Significant for diabetes mellitus, heart disease, colon cancer.   ROS: 13 point review systems were reviewed with the patient, and are all negative with the exception of the aforementioned in the history of present illness.   VS: Blood pressure 205/117, respiration 20, pulse 106, oxygen saturation 97%   Exam: Patient is in no apparent distress.  Patient appears as stated age.  No obvious acute respiratory or cardiac distress.  Patient is well groomed and well-nourished. 1a- LOC: Keenly responsive - 0     1b- LOC questions: Answers both questions correctly - 0    1c- LOC commands- Performs both tasks correctly- 0    2- Gaze: Normal; no gaze paresis or gaze deviation - 0    3- Visual Fields: normal, no Visual field deficit - 0    4- Facial movements: right facial palsy - 2    5- Upper limb motor - right arm drift - 1    6- Lower limb motor - no drift - 0     7- Limb Coordination: absent ataxia - 0     8- Sensory: no sensory loss - 0     9- Language - No aphasia - 0     10- Speech - Mild dysarthria - 1 11- Neglect / Extinction - none found - 0   NIHSS score: 4   Diagnostic Data: CT of the head showed no acute intracranial hemorrhage, mass, or large territory stroke.  There appears to be a possible old left periventricular lacunar stroke.  Blood sugar 109  Medical Data Reviewed:   1.Data?reviewed include clinical labs, radiology,?and medical tests;   2.Tests?results discussed w/performing or interpreting physician;   3.Obtaining/reviewing old medical records;   4.Obtaining?case history from another source;   5.Independent?review of image, tracing, or specimen.    Medical Decision Making:   - Extensive number of diagnosis or management options are considered below.   - Extensive amount of complex data reviewed.   - High risk of complication and/or morbidity or mortality are associated with differential diagnostic considerations below.    - There may be?uncertain?outcome and increased probability of prolonged functional impairment or high probability of severe prolonged functional impairment associated with some of these differential diagnosis.    Differential Diagnosis for Stroke:   1.?Cardioembolic?stroke   2. Small vessel disease/lacune   3. Thromboembolic, artery-to-artery mechanism   4.?Hypercoagulable?state-related infarct   5. Transient ischemic attack   6. Thrombotic mechanism, large artery disease    Assessment: 1.  Probable acute left subcortical stroke 2.  Chronic gait abnormality 3.  Possible left hemisphere TIA in February 2017 4.  Osteoarthritis 5.  Diabetes mellitus 6.  Hypertension 8.  History of PE on Xarelto  Recommendations: Patient can be admitted to the hospital for further work-up of her symptoms. Consult local neurology team to assist with evaluation of her stroke symptoms and also her chronic gait abnormality. Maintain the patient on Xarelto for now. Can start a baby aspirin. Allow permissive hypertension. Check MRI brain without contrast to rule out any acute intracranial process. Check MRA of the head and neck to evaluate her intracranial and extracranial blood vessels. Could also consider an MRI of the lumbar spine to evaluate her chronic gait issues. Check echocardiogram to gauge her cardiac function. Maintain the patient on telemetry to look for paroxysmal atrial fibrillation. Check hemoglobin A1c and lipid panel. Consult PT, OT, and ST. Continue supportive care. Plan of care was discussed with the patient.  Thank you for allowing [facility] to participate in the care of your patient. Please call me, Dr. Dale [place], with any questions at [phone]. Case discussed with the ER staff and Dr. Thurnell Garbe.   Critical Care notation:   I was called to see this critical patient emergently. I personally evaluated this critical patient for acute stroke evaluation, and determining their  eligibility for IV Alteplase and interventional therapies.  I have spent approximately 13 minutes with the patient, including time at bedside, time discussing the case with other physicians, reviewing plan of care, and time independently reviewing the records and scans.

## 2018-07-24 NOTE — ED Notes (Signed)
Neurologist on screen at this time. Awaiting arrival of pt back from CT

## 2018-07-24 NOTE — ED Notes (Signed)
PT cleared and taken to CT scan at this time by primary nurse Darnelle Bos, RN

## 2018-07-24 NOTE — Evaluation (Signed)
Physical Therapy Evaluation Patient Details Name: [name] MRN: [id number] DOB: Jul 10, 1950 Today's Date: 07/24/2018   History of Present Illness  [name] is a 68 y.o. female with medical history significant for prior PE with current Xarelto, type 2 diabetes, hypertension, recurrent cellulitis, prior TIA on 03/2015, and chronic ambulatory dysfunction-wheelchair-bound who presented to the ED from [facility] after waking up at approximately 7:45 AM with right-sided facial droop and slurred speech.  She was actually awake at 6 AM as well and was noted to be at her baseline at that point.  Patient denies any headaches, visual disturbances, extremity sensory deficits or other motor weakness.  She has remained on her Xarelto due to prior PE on a daily basis.  She is wheelchair-bound and continues to have what appears to be significant lower extremity deconditioning that prevents her from ambulating.    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  Patient will benefit from continued physical therapy in hospital and recommended venue below to maintain mobility and avoid worsening of contracture and prevent bed sores.    Follow Up Recommendations SNF    Equipment Recommendations       Recommendations for Other Services       Precautions / Restrictions Precautions Precautions: Fall Restrictions Weight Bearing Restrictions: No      Mobility  Bed Mobility Overal bed mobility: Needs Assistance Bed Mobility: Supine to Sit;Sit to Supine     Supine to sit: Mod assist Sit to supine: Mod assist   General bed mobility comments: slow labored movement  Transfers                    Ambulation/Gait                Stairs            Wheelchair Mobility    Modified Rankin (Stroke Patients Only)       Balance Overall balance assessment: Needs assistance Sitting-balance support: Feet supported;No upper extremity supported Sitting balance-Leahy Scale: Fair Sitting balance - Comments: seated at bedside, fair/poor when attempting exercises with BLE                                     Pertinent Vitals/Pain Pain Assessment: No/denies pain    Home Living Family/patient expects to be discharged to:: Skilled nursing facility                      Prior Function Level of Independence: Needs assistance   Gait / Transfers Assistance Needed: non-ambulatory x 3 years, NSG staff using hoyer lift to transfer patient to wheelchair, patient able to self propel in wheelchair  ADL's / Homemaking Assistance Needed: assisted by nurisng home staff        Hand Dominance   Dominant Hand: Right    Extremity/Trunk Assessment   Upper Extremity Assessment Upper Extremity Assessment: Defer to OT evaluation    Lower Extremity Assessment Lower Extremity Assessment: Generalized weakness;RLE deficits/detail;LLE deficits/detail RLE Deficits / Details: grossly -3/5, mild/moderate hamstring contracture LLE Deficits / Details: grossly -3/5, has severe hamstring contracture    Cervical / Trunk Assessment  Cervical / Trunk Assessment: Normal  Communication   Communication: Expressive difficulties  Cognition Arousal/Alertness: Awake/alert Behavior During Therapy: WFL for tasks assessed/performed Overall Cognitive Status: Within Functional Limits for tasks assessed                                        General Comments      Exercises     Assessment/Plan    PT Assessment Patient needs continued PT services  PT Problem List Decreased strength;Decreased activity tolerance;Decreased balance;Decreased mobility       PT  Treatment Interventions Therapeutic exercise;Functional mobility training;Therapeutic activities;Patient/family education;Wheelchair mobility training    PT Goals (Current goals can be found in the Care Plan section)  Acute Rehab PT Goals Patient Stated Goal: return to SNF PT Goal Formulation: With patient Time For Goal Achievement: 07/27/18 Potential to Achieve Goals: Good    Frequency Min 2X/week   Barriers to discharge        Co-evaluation               AM-PAC PT "6 Clicks" Mobility  Outcome Measure Help needed turning from your back to your side while in a flat bed without using bedrails?: A Lot Help needed moving from lying on your back to sitting on the side of a flat bed without using bedrails?: A Lot Help needed moving to and from a bed to a chair (including a wheelchair)?: Total Help needed standing up from a chair using your arms (e.g., wheelchair or bedside chair)?: Total Help needed to walk in hospital room?: Total Help needed climbing 3-5 steps with a railing? : Total 6 Click Score: 8    End of Session   Activity Tolerance: Patient tolerated treatment well;Patient limited by fatigue Patient left: in bed;with call bell/phone within reach Nurse Communication: Mobility status PT Visit Diagnosis: Unsteadiness on feet (R26.81);Other abnormalities of gait and mobility (R26.89);Muscle weakness (generalized) (M62.81)    Time: 3734-2876 PT Time Calculation (min) (ACUTE ONLY): 29 min   Charges:   PT Evaluation $PT Eval Moderate Complexity: 1 Mod PT Treatments $Therapeutic Activity: 23-37 mins        1:31 PM, 07/24/18 Lonell Grandchild, MPT Physical Therapist with [facility] [phone] office [phone] mobile phone

## 2018-07-24 NOTE — H&P (Addendum)
History and Physical    [name] HUT:[id number] DOB: [birth year] DOA: 07/24/2018  PCP: Sinda Du, MD   Patient coming from: [facility]  Chief Complaint: Right facial droop and slurred speech  HPI: [name] is a 68 y.o. female with medical history significant for prior PE with current Xarelto, type 2 diabetes, hypertension, recurrent cellulitis, prior TIA on 03/2015, and chronic ambulatory dysfunction-wheelchair-bound who presented to the ED from [facility] after waking up at approximately 7:45 AM with right-sided facial droop and slurred speech.  She was actually awake at 6 AM as well and was noted to be at her baseline at that point.  Patient denies any headaches, visual disturbances, extremity sensory deficits or other motor weakness.  She has remained on her Xarelto due to prior PE on a daily basis.  She is wheelchair-bound and continues to have what appears to be significant lower extremity deconditioning that prevents her from ambulating.   ED Course: Vital signs demonstrate some tachycardia and elevated blood pressure readings.  Hemoglobin noted to be 11.8 and creatinine 1.38, this appears to be related to CKD stage III with only prior comparison on 03/2015 at 1.11 creatinine.  CT of the head performed with no acute abnormalities.  Follow-up MRI noted to demonstrate left MCA vessel distribution CVA.  Discussed with neurology Dr. Merlene Laughter who will see him later today.  Review of Systems: All others reviewed and otherwise negative.  Past Medical History:  Diagnosis Date  . Cellulitis lower extremities   4 day hospitalization 12/25/14-12/29/14  . Essential hypertension, benign   . History of MRSA infection   . History of panniculitis   . Pulmonary emboli ([place]) 2009  . Skin ulcer of thigh ([place])   . Type 2 diabetes mellitus ([place])     Past Surgical History:  Procedure Laterality Date  . PARTIAL HYSTERECTOMY    . Right thigh ulcer debridement  2008   Two occasions - wound  VAC     reports that she has never smoked. She has never used smokeless tobacco. She reports that she does not drink alcohol or use drugs.  Allergies  Allergen Reactions  . Vancomycin Hives and Rash    "big red bumps, then they turned white, then I shedded like a snake."     Family History  Problem Relation Age of Onset  . Cancer - Colon Father   . Heart disease Mother   . Diabetes Mellitus II Mother     Prior to Admission medications   Medication Sig Start Date End Date Taking? Authorizing Provider  ammonium lactate (LAC-HYDRIN) 12 % lotion Apply topically 2 (two) times daily. 04/13/15  Yes Sinda Du, MD  aspirin 81 MG chewable tablet Chew 1 tablet (81 mg total) by mouth daily. 04/13/15  Yes Sinda Du, MD  citalopram (CELEXA) 20 MG tablet Take 1 tablet (20 mg total) by mouth daily. 04/13/15  Yes Sinda Du, MD  gabapentin (NEURONTIN) 100 MG capsule Take 100 mg by mouth 3 (three) times daily. 07/19/18  Yes [provider]  metFORMIN (GLUCOPHAGE) 1000 MG tablet Take 1 tablet by mouth 2 (two) times a day. 06/30/18  Yes [provider]  Multiple Vitamin (MULTIVITAMIN WITH MINERALS) TABS tablet Take 1 tablet by mouth daily. *CENTRUM SILVER   Yes [provider]  oxyCODONE-acetaminophen (PERCOCET/ROXICET) 5-325 MG per tablet Take 1 tablet by mouth 4 (four) times daily.  11/23/11  Yes [provider]  senna (SENOKOT) 8.6 MG TABS tablet Take 1 tablet (  8.6 mg total) by mouth daily as needed for mild constipation. 04/13/15  Yes Sinda Du, MD  verapamil (CALAN-SR) 180 MG CR tablet Take 1 tablet by mouth daily. 07/01/18  Yes [provider]  XARELTO 20 MG TABS Take 20 mg by mouth daily with supper.  11/29/11  Yes [provider]  lisinopril (PRINIVIL,ZESTRIL) 20 MG tablet Take 1 tablet (20 mg total) by mouth daily. Patient not taking: Reported on 07/24/2018 04/13/15   Sinda Du, MD  sulfamethoxazole-trimethoprim (BACTRIM  DS,SEPTRA DS) 800-160 MG tablet Take 1 tablet by mouth 2 (two) times daily. Patient not taking: Reported on 07/24/2018 04/13/15   Sinda Du, MD    Physical Exam: Vitals:   07/24/18 0930 07/24/18 0945 07/24/18 1000 07/24/18 1138  BP: (!) 198/57 (!) 183/64 (!) 179/65 128/64  Pulse: (!) 103 92 90 93  Resp: 16 19 20    SpO2: 97% 96% 96% 97%  Weight:      Height:        Constitutional: NAD, calm, comfortable, morbidly obese Vitals:   07/24/18 0930 07/24/18 0945 07/24/18 1000 07/24/18 1138  BP: (!) 198/57 (!) 183/64 (!) 179/65 128/64  Pulse: (!) 103 92 90 93  Resp: 16 19 20    SpO2: 97% 96% 96% 97%  Weight:      Height:       Eyes: lids and conjunctivae normal ENMT: Mucous membranes are moist.  Neck: normal, supple Respiratory: clear to auscultation bilaterally. Normal respiratory effort. No accessory muscle use.  Cardiovascular: Regular rate and rhythm, no murmurs. No extremity edema. Abdomen: no tenderness, no distention. Bowel sounds positive.  Musculoskeletal:  No joint deformity upper and lower extremities.   Skin: no rashes, lesions, ulcers.  Psychiatric: Normal judgment and insight. Alert and oriented x 3. Normal mood.  Neurologic: Right-sided lower facial droop present.  Continues to have slurred speech.  Upper extremity motor strength appears to be symmetric.  No diminished sensation.  Similar exam in lower extremities.  Labs on Admission: I have personally reviewed following labs and imaging studies  CBC: Recent Labs  Lab 07/24/18 0832  WBC 8.2  NEUTROABS 4.5  HGB 11.8*  13.6  HCT 40.4  40.0  MCV 88.0  PLT 973   Basic Metabolic Panel: Recent Labs  Lab 07/24/18 0832  NA 143  143  K 3.9  4.0  CL 107  108  CO2 25  GLUCOSE 113*  110*  BUN 20  19  CREATININE 1.38*  1.40*  CALCIUM 8.7*   GFR: Estimated Creatinine Clearance: 46.1 mL/min (A) (by C-G formula based on SCr of 1.4 mg/dL (H)). Liver Function Tests: Recent Labs  Lab 07/24/18 0832   AST 11*  ALT 11  ALKPHOS 50  BILITOT 0.4  PROT 7.3  ALBUMIN 3.4*   No results for input(s): LIPASE, AMYLASE in the last 168 hours. No results for input(s): AMMONIA in the last 168 hours. Coagulation Profile: Recent Labs  Lab 07/24/18 0832  INR 1.2   Cardiac Enzymes: No results for input(s): CKTOTAL, CKMB, CKMBINDEX, TROPONINI in the last 168 hours. BNP (last 3 results) No results for input(s): PROBNP in the last 8760 hours. HbA1C: No results for input(s): HGBA1C in the last 72 hours. CBG: Recent Labs  Lab 07/24/18 0817  GLUCAP 109*   Lipid Profile: No results for input(s): CHOL, HDL, LDLCALC, TRIG, CHOLHDL, LDLDIRECT in the last 72 hours. Thyroid Function Tests: No results for input(s): TSH, T4TOTAL, FREET4, T3FREE, THYROIDAB in the last 72 hours. Anemia Panel: No  results for input(s): VITAMINB12, FOLATE, FERRITIN, TIBC, IRON, RETICCTPCT in the last 72 hours. Urine analysis:    Component Value Date/Time   COLORURINE YELLOW 04/09/2015 [place] 04/09/2015 1242   LABSPEC 1.015 04/09/2015 1242   PHURINE 5.5 04/09/2015 1242   GLUCOSEU NEGATIVE 04/09/2015 1242   HGBUR NEGATIVE 04/09/2015 1242   BILIRUBINUR MODERATE (A) 04/09/2015 1242   KETONESUR 15 (A) 04/09/2015 1242   PROTEINUR NEGATIVE 04/09/2015 1242   UROBILINOGEN 0.2 12/25/2014 1248   NITRITE NEGATIVE 04/09/2015 1242   LEUKOCYTESUR NEGATIVE 04/09/2015 1242    Radiological Exams on Admission: Mr Brain Wo Contrast  Result Date: 07/24/2018 CLINICAL DATA:  Acute presentation with speech disturbance. EXAM: MRI HEAD WITHOUT CONTRAST MRA HEAD WITHOUT CONTRAST TECHNIQUE: Multiplanar, multiecho pulse sequences of the brain and surrounding structures were obtained without intravenous contrast. Angiographic images of the head were obtained using MRA technique without contrast. COMPARISON:  Head CT earlier same day FINDINGS: MRI HEAD FINDINGS Brain: Diffusion imaging shows a 1.5 cm to 2 cm acute infarction  affecting the left posterior frontal cortical and subcortical brain. There is also a punctate acute infarction in the right hippocampus. Otherwise, no acute finding is seen. No evidence of mass, hydrocephalus or extra-axial collection. Old left frontal white matter infarction. Vascular: Major vessels at the base of the brain show flow. Skull and upper cervical spine: Negative Sinuses/Orbits: Clear Other: None MRA HEAD FINDINGS Both internal carotid arteries are widely patent into the brain. No siphon stenosis. The anterior and middle cerebral vessels are patent without proximal stenosis, aneurysm or vascular malformation. Both vertebral arteries are widely patent to the basilar. No basilar stenosis. Posterior circulation branch vessels appear normal. IMPRESSION: 1.5-2 cm region of acute infarction affecting the left posterior frontal cortical and subcortical brain consistent with MCA branch vessel infarction. Punctate acute infarction in the right hippocampus. Differing vascular distributions suggest embolic disease from the heart or ascending aorta. Negative intracranial MR angiography of the large and medium size vessels. Electronically Signed   By: Nelson Chimes M.D.   On: 07/24/2018 11:10   Mr Jodene Nam Head/brain RC Cm  Result Date: 07/24/2018 CLINICAL DATA:  Acute presentation with speech disturbance. EXAM: MRI HEAD WITHOUT CONTRAST MRA HEAD WITHOUT CONTRAST TECHNIQUE: Multiplanar, multiecho pulse sequences of the brain and surrounding structures were obtained without intravenous contrast. Angiographic images of the head were obtained using MRA technique without contrast. COMPARISON:  Head CT earlier same day FINDINGS: MRI HEAD FINDINGS Brain: Diffusion imaging shows a 1.5 cm to 2 cm acute infarction affecting the left posterior frontal cortical and subcortical brain. There is also a punctate acute infarction in the right hippocampus. Otherwise, no acute finding is seen. No evidence of mass, hydrocephalus or  extra-axial collection. Old left frontal white matter infarction. Vascular: Major vessels at the base of the brain show flow. Skull and upper cervical spine: Negative Sinuses/Orbits: Clear Other: None MRA HEAD FINDINGS Both internal carotid arteries are widely patent into the brain. No siphon stenosis. The anterior and middle cerebral vessels are patent without proximal stenosis, aneurysm or vascular malformation. Both vertebral arteries are widely patent to the basilar. No basilar stenosis. Posterior circulation branch vessels appear normal. IMPRESSION: 1.5-2 cm region of acute infarction affecting the left posterior frontal cortical and subcortical brain consistent with MCA branch vessel infarction. Punctate acute infarction in the right hippocampus. Differing vascular distributions suggest embolic disease from the heart or ascending aorta. Negative intracranial MR angiography of the large and medium size vessels. Electronically Signed  By: Nelson Chimes M.D.   On: 07/24/2018 11:10   Ct Head Code Stroke Wo Contrast  Result Date: 07/24/2018 CLINICAL DATA:  Code stroke.  Speech difficulty EXAM: CT HEAD WITHOUT CONTRAST TECHNIQUE: Contiguous axial images were obtained from the base of the skull through the vertex without intravenous contrast. COMPARISON:  04/09/2015 FINDINGS: Brain: Remote appearing lacunar infarcts in the left centrum semiovale. Left thalamic lacunar infarct that is likely chronic based on appearance and history. Small vessel ischemic gliosis in the white matter. New generalized cerebral volume loss. No evidence of acute gray matter infarct, hemorrhage, hydrocephalus, or mass. Vascular: No hyperdense vessel. There is atherosclerotic calcification. Skull: Negative Sinuses/Orbits: Negative Other: These results were called by telephone at the time of interpretation on 07/24/2018 at 8:49 am to Dr. Francine Graven , who verbally acknowledged these results. ASPECTS [facility] Stroke Program Early CT  Score) - Ganglionic level infarction (caudate, lentiform nuclei, internal capsule, insula, M1-M3 cortex): 7 - Supraganglionic infarction (M4-M6 cortex): 3 Total score (0-10 with 10 being normal): 10 IMPRESSION: 1. Negative for hemorrhage or acute cortical infarct. ASPECTS is 10. 2. 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 These are new from 2017. 3. Cerebral volume loss since 2017. Electronically Signed   By: Monte Fantasia M.D.   On: 07/24/2018 08:53    EKG: Independently reviewed.  Sinus rhythm 97 bpm.  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 -Presented with right-sided facial droop and dysarthria -Awaiting SLP, PT, OT evaluation -Appreciate neurology evaluation -Bilateral carotid ultrasounds pending as patient cannot have MRA neck due to body habitus -2D echocardiogram pending -Continue Xarelto and aspirin for now and will start statin -Hemoglobin A1c and fasting lipids pending -IV normal saline for 12 hours  History of pulmonary embolus -Continue on Xarelto with 2D echocardiogram pending  Type 2 diabetes -No significant hyperglycemia currently noted -Place on SSI while inpatient and monitor closely -Holding home metformin for now  Essential hypertension -Allow permissive hypertension and hold home lisinopril -Maintain on verapamil for heart rate control  Normocytic anemia -We will check anemia panel -No overt bleeding noted -Repeat CBC in a.m.  Questionable CKD stage III -Repeat BMP in a.m. -Avoid nephrotoxic agents and maintain on IV fluid for now  Chronic ambulatory dysfunction -Appreciate PT evaluation; appears to be related to deconditioning  Morbid  obesity -We will need to work on weight loss in the outpatient setting   DVT prophylaxis: Xarelto Code Status: DNR Family Communication: Attempted phone call to son with no response Disposition Plan: CVA work-up and evaluation Consults called: Neurology, Dr. Merlene Laughter Admission status: Inpatient, telemetry   Prentiss Polio D Manuella Ghazi DO [facility] Pager [phone]  If 7PM-7AM, please contact night-coverage [email or web address] Password [email or web address]  07/24/2018, 11:58 AM

## 2018-07-24 NOTE — ED Notes (Signed)
Teleneuro cart activated at this time with Izell King William., RN on screen gathering pt information.

## 2018-07-24 NOTE — ED Notes (Signed)
Spoke with son Park Meo regarding pts treatment and given room number and Nurses name for change of care.

## 2018-07-24 NOTE — ED Triage Notes (Signed)
PT brought in by [facility] today for c/o left sided facial droop, slurred speech and aphasia. PT denies any pain or headache. PT from [facility]. PT states she felt normal when she went to bed last night and around 0600 this am a CNA changed her and her speech was normal then per the patient and she went back to sleep. PT states she woke up around 0745 and had some slurred speech and difficulty talking. PT on xarelto for hx of PE.

## 2018-07-24 NOTE — ED Notes (Signed)
PT arrived back from CT at this time and being evaluated by neurologist.

## 2018-07-24 NOTE — ED Notes (Signed)
EDP at bedside at this time for eval of pt.

## 2018-07-24 NOTE — Progress Notes (Signed)
CODE STROKE CT TIMES 0805 CODE STROKE CALLED 0832 EXAM STARTED 0834 EXAM FINISHED 0834 IMAGES SENT TO SOC 0838 EXAM COMPLETED IN EPIC [place]

## 2018-07-25 ENCOUNTER — Inpatient Hospital Stay (HOSPITAL_COMMUNITY): Payer: Medicare Other

## 2018-07-25 DIAGNOSIS — I340 Nonrheumatic mitral (valve) insufficiency: Secondary | ICD-10-CM

## 2018-07-25 LAB — BASIC METABOLIC PANEL
Anion gap: 9 (ref 5–15)
BUN: 18 mg/dL (ref 8–23)
CO2: 26 mmol/L (ref 22–32)
Calcium: 8.4 mg/dL — ABNORMAL LOW (ref 8.9–10.3)
Chloride: 110 mmol/L (ref 98–111)
Creatinine, Ser: 1.27 mg/dL — ABNORMAL HIGH (ref 0.44–1.00)
GFR calc Af Amer: 50 mL/min — ABNORMAL LOW (ref >60)
GFR calc non Af Amer: 43 mL/min — ABNORMAL LOW (ref >60)
Glucose, Bld: 99 mg/dL (ref 70–99)
Potassium: 3.8 mmol/L (ref 3.5–5.1)
Sodium: 145 mmol/L (ref 135–145)

## 2018-07-25 LAB — CBC
HCT: 37.3 % (ref 36.0–46.0)
Hemoglobin: 10.7 g/dL — ABNORMAL LOW (ref 12.0–15.0)
MCH: 25.4 pg — ABNORMAL LOW (ref 26.0–34.0)
MCHC: 28.7 g/dL — ABNORMAL LOW (ref 30.0–36.0)
MCV: 88.6 fL (ref 80.0–100.0)
Platelets: 255 10*3/uL (ref 150–400)
RBC: 4.21 MIL/uL (ref 3.87–5.11)
RDW: 14.8 % (ref 11.5–15.5)
WBC: 7.6 10*3/uL (ref 4.0–10.5)
nRBC: 0 % (ref 0.0–0.2)

## 2018-07-25 LAB — MRSA PCR SCREENING: MRSA by PCR: NEGATIVE

## 2018-07-25 LAB — GLUCOSE, CAPILLARY
Glucose-Capillary: 89 mg/dL (ref 70–99)
Glucose-Capillary: 95 mg/dL (ref 70–99)
Glucose-Capillary: 102 mg/dL — ABNORMAL HIGH (ref 70–99)
Glucose-Capillary: 104 mg/dL — ABNORMAL HIGH (ref 70–99)

## 2018-07-25 LAB — ECHOCARDIOGRAM COMPLETE
Height: 60 in
Weight: 4296 oz

## 2018-07-25 LAB — HEMOGLOBIN A1C
Hgb A1c MFr Bld: 6.4 % — ABNORMAL HIGH (ref 4.8–5.6)
Mean Plasma Glucose: 136.98 mg/dL

## 2018-07-25 LAB — LIPID PANEL
Cholesterol: 111 mg/dL (ref 0–200)
HDL: 44 mg/dL (ref >40)
LDL Cholesterol: 53 mg/dL (ref 0–99)
Total CHOL/HDL Ratio: 2.5 RATIO
Triglycerides: 71 mg/dL (ref <150)
VLDL: 14 mg/dL (ref 0–40)

## 2018-07-25 LAB — HIV ANTIBODY (ROUTINE TESTING W REFLEX): HIV Screen 4th Generation wRfx: NONREACTIVE

## 2018-07-25 MED ORDER — APIXABAN 5 MG PO TABS
5.0000 mg | ORAL_TABLET | Freq: Two times a day (BID) | ORAL | Status: DC
  Administered 2018-07-25 – 2018-07-26 (×2): 5 mg via ORAL
  Filled 2018-07-25 (×2): qty 1

## 2018-07-25 NOTE — Progress Notes (Signed)
Subjective: She was admitted yesterday because of a stroke.  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  She had tele-neurology consultation in the emergency department.  She still has some trouble with her speech.  She said she had some trouble swallowing yesterday and speech evaluation is pending she is not having any other stroke symptoms specifically no hemiplegia.  She does have bilateral lower extremity weakness which is chronic  Objective: Vital signs in last 24 hours: Temp:  [97.6 F (36.4 C)-98.9 F (37.2 C)] 97.6 F (36.4 C) (06/04 0449) Pulse Rate:  [75-104] 79 (06/04 0449) Resp:  [16-24] 17 (06/04 0449) BP: (128-208)/(47-122) 163/78 (06/04 0449) SpO2:  [95 %-99 %] 99 % (06/04 0449) Weight:  [121.8 kg] 121.8 kg (06/03 0836) Weight change:  Last BM Date: 07/23/18  Intake/Output from previous day: 06/03 0701 - 06/04 0700 In: -  Out: 700 [Urine:700]  PHYSICAL EXAM General appearance: alert, cooperative and no distress Resp: clear to auscultation bilaterally Cardio: regular rate and rhythm, S1, S2 normal, no murmur, click, rub or gallop GI: soft, non-tender; bowel sounds normal; no masses,  no organomegaly Extremities: She has weakness of the lower extremities bilaterally She does have significant difficulty with speech but she can make herself understood Lab Results:  Results for orders placed or performed during the hospital encounter of 07/24/18 (from the past 48 hour(s))  CBG monitoring, ED     Status: Abnormal   Collection Time: 07/24/18  8:17 AM  Result Value Ref Range   Glucose-Capillary 109 (H) 70 - 99 mg/dL  Reticulocytes     Status: None   Collection Time: 07/24/18  8:30 AM  Result Value Ref Range   Retic Ct Pct 0.8 0.4 - 3.1 %   RBC. 4.52 3.87 - 5.11 MIL/uL   Retic Count, Absolute 34.8 19.0 - 186.0 K/uL   Immature Retic Fract 13.9 2.3 - 15.9 %    Comment: Performed at [facility], [address]  [place], [place] [address]  I-stat chem 8, ED     Status: Abnormal   Collection Time: 07/24/18  8:32 AM  Result Value Ref Range   Sodium 143 135 - 145 mmol/L   Potassium 4.0 3.5 - 5.1 mmol/L   Chloride 108 98 - 111 mmol/L   BUN 19 8 - 23 mg/dL   Creatinine, Ser 1.40 (H) 0.44 - 1.00 mg/dL   Glucose, Bld 110 (H) 70 - 99 mg/dL   Calcium, Ion 1.12 (L) 1.15 - 1.40 mmol/L   TCO2 27 22 - 32 mmol/L   Hemoglobin 13.6 12.0 - 15.0 g/dL   HCT 40.0 36.0 - 46.0 %  Ethanol     Status: None   Collection Time: 07/24/18  8:32 AM  Result Value Ref Range   Alcohol, Ethyl (B) <10 <10 mg/dL    Comment: (NOTE) Lowest detectable limit for serum alcohol is 10 mg/dL. For medical purposes only. Performed at [facility], [address] [place], [place] [address]   Protime-INR     Status: Abnormal   Collection Time: 07/24/18  8:32 AM  Result Value Ref Range   Prothrombin Time 15.4 (H) 11.4 - 15.2 seconds   INR 1.2 0.8 - 1.2    Comment: (NOTE) INR goal varies based on device and disease states. Performed at [facility], [address] [place], [place] [address]   APTT     Status: None   Collection Time: 07/24/18  8:32 AM  Result Value Ref Range   aPTT 28 24 - 36 seconds    Comment: Performed at [facility], [address], [place], [place] [address]  CBC     Status: Abnormal   Collection Time: 07/24/18  8:32 AM  Result Value Ref Range   WBC 8.2 4.0 - 10.5 K/uL   RBC 4.59 3.87 - 5.11 MIL/uL   Hemoglobin 11.8 (L) 12.0 - 15.0 g/dL   HCT 40.4 36.0 - 46.0 %   MCV 88.0 80.0 - 100.0 fL   MCH 25.7 (L) 26.0 - 34.0 pg   MCHC 29.2 (L) 30.0 - 36.0 g/dL   RDW 15.1 11.5 - 15.5 %   Platelets 267 150 - 400 K/uL   nRBC 0.0 0.0 - 0.2 %    Comment: Performed at [facility], [address] [place], [place] [address]  Differential     Status: None   Collection Time: 07/24/18  8:32 AM  Result Value Ref Range   Neutrophils Relative % 55 %   Neutro Abs 4.5 1.7 - 7.7 K/uL   Lymphocytes Relative 37 %   Lymphs Abs 3.0  0.7 - 4.0 K/uL   Monocytes Relative 6 %   Monocytes Absolute 0.5 0.1 - 1.0 K/uL   Eosinophils Relative 2 %   Eosinophils Absolute 0.2 0.0 - 0.5 K/uL   Basophils Relative 0 %   Basophils Absolute 0.0 0.0 - 0.1 K/uL   Immature Granulocytes 0 %   Abs Immature Granulocytes 0.03 0.00 - 0.07 K/uL    Comment: Performed at [facility], [address], [place], [place] [address]  Comprehensive metabolic panel     Status: Abnormal   Collection Time: 07/24/18  8:32 AM  Result Value Ref Range   Sodium 143 135 - 145 mmol/L   Potassium 3.9 3.5 - 5.1 mmol/L   Chloride 107 98 - 111 mmol/L   CO2 25 22 - 32 mmol/L   Glucose, Bld 113 (H) 70 - 99 mg/dL   BUN 20 8 - 23 mg/dL   Creatinine, Ser 1.38 (H) 0.44 - 1.00 mg/dL   Calcium 8.7 (L) 8.9 - 10.3 mg/dL   Total Protein 7.3 6.5 - 8.1 g/dL   Albumin 3.4 (L) 3.5 - 5.0 g/dL   AST 11 (L) 15 - 41 U/L   ALT 11 0 - 44 U/L   Alkaline Phosphatase 50 38 - 126 U/L   Total Bilirubin 0.4 0.3 - 1.2 mg/dL   GFR calc non Af Amer 39 (L) >60 mL/min   GFR calc Af Amer 45 (L) >60 mL/min   Anion gap 11 5 - 15    Comment: Performed at [facility], [address], [place], [place] [address]  SARS Coronavirus 2 (CEPHEID - Performed in [place] hospital lab), Hosp Order     Status: None   Collection Time: 07/24/18  8:34 AM  Result Value Ref Range   SARS Coronavirus 2 NEGATIVE NEGATIVE    Comment: (NOTE) If result is NEGATIVE SARS-CoV-2 target nucleic acids are NOT DETECTED. The SARS-CoV-2 RNA is generally detectable in upper and lower  respiratory specimens during the acute phase of infection. The lowest  concentration of SARS-CoV-2 viral copies this assay can detect is 250  copies / mL. A negative result does not preclude SARS-CoV-2 infection  and should not be used as the sole basis for treatment or other  patient management decisions.  A negative result may occur with  improper specimen collection / handling, submission of specimen other  than nasopharyngeal  swab, presence of viral mutation(s) within the  areas targeted by this assay, and inadequate number of viral copies  (<250 copies / mL). A negative result must be combined with clinical  observations, patient history, and epidemiological information. If result is POSITIVE SARS-CoV-2 target nucleic acids are DETECTED. The SARS-CoV-2 RNA is generally detectable in upper and lower  respiratory specimens dur ing the acute phase of infection.  Positive  results are indicative of active infection with SARS-CoV-2.  Clinical  correlation with patient history and other diagnostic information is  necessary to determine patient infection status.  Positive results do  not rule out bacterial infection or co-infection with other viruses. If result is PRESUMPTIVE POSTIVE SARS-CoV-2 nucleic acids MAY BE PRESENT.   A presumptive positive result was obtained on the submitted specimen  and confirmed on repeat testing.  While 2019 novel coronavirus  (SARS-CoV-2) nucleic acids may be present in the submitted sample  additional confirmatory testing may be necessary for epidemiological  and / or clinical management purposes  to differentiate between  SARS-CoV-2 and other Sarbecovirus currently known to infect humans.  If clinically indicated additional testing with an alternate test  methodology [phone]) is advised. The SARS-CoV-2 RNA is generally  detectable in upper and lower respiratory sp ecimens during the acute  phase of infection. The expected result is Negative. Fact Sheet for Patients:  StrictlyIdeas.no Fact Sheet for Healthcare Providers: BankingDealers.co.za This test is not yet approved or cleared by the [place] FDA and has been authorized for detection and/or diagnosis of SARS-CoV-2 by FDA under an Emergency Use Authorization (EUA).  This EUA will remain in effect (meaning this test can be used) for the duration of the COVID-19 declaration  under Section 564(b)(1) of the Act, 21 U.S.C. section 360bbb-3(b)(1), unless the authorization is terminated or revoked sooner. Performed at [facility], [address] [place], [place] [address]   HIV antibody (Routine Testing)     Status: None   Collection Time: 07/24/18  9:48 AM  Result Value Ref Range   HIV Screen 4th Generation wRfx Non Reactive Non Reactive    Comment: (NOTE) Performed At: [facility] [place], [place] [id number] Rush Farmer MD IR:[id number]   Vitamin B12     Status: Abnormal   Collection Time: 07/24/18  1:33 PM  Result Value Ref Range   Vitamin B-12 1,078 (H) 180 - 914 pg/mL    Comment: (NOTE) This assay is not validated for testing neonatal or myeloproliferative syndrome specimens for Vitamin B12 levels. Performed at [facility], [address] [place], [place] [address]   Iron and TIBC     Status: Abnormal   Collection Time: 07/24/18  1:33 PM  Result Value Ref Range   Iron 35 28 - 170 ug/dL   TIBC 239 (L) 250 - 450 ug/dL   Saturation Ratios 15 10.4 - 31.8 %   UIBC 204 ug/dL    Comment: Performed at [facility], [address], [place], [place] [address]  Ferritin     Status: None   Collection Time: 07/24/18  1:33 PM  Result Value Ref Range   Ferritin 110 11 - 307 ng/mL    Comment: Performed at [facility], [address], [place],  [address]  Folate     Status: None   Collection Time: 07/24/18  1:34 PM  Result Value Ref Range   Folate 13.4 >5.9 ng/mL    Comment: Performed at [facility], [address] [place]  [place], [place] [address]  Glucose, capillary     Status: None   Collection Time: 07/24/18  4:27 PM  Result Value Ref Range   Glucose-Capillary 88 70 - 99 mg/dL   Comment 1 Notify RN    Comment 2 Document in Chart   MRSA PCR Screening     Status: None   Collection Time: 07/24/18  8:52 PM  Result Value Ref Range   MRSA by PCR NEGATIVE NEGATIVE    Comment:        The GeneXpert MRSA Assay (FDA approved for  NASAL specimens only), is one component of a comprehensive MRSA colonization surveillance program. It is not intended to diagnose MRSA infection nor to guide or monitor treatment for MRSA infections. Performed at [facility], [address] [place], [place] [address]   Glucose, capillary     Status: None   Collection Time: 07/24/18  9:13 PM  Result Value Ref Range   Glucose-Capillary 89 70 - 99 mg/dL  Urine rapid drug screen (hosp performed)not at [facility]     Status: None   Collection Time: 07/24/18  9:44 PM  Result Value Ref Range   Opiates NONE DETECTED NONE DETECTED   Cocaine NONE DETECTED NONE DETECTED   Benzodiazepines NONE DETECTED NONE DETECTED   Amphetamines NONE DETECTED NONE DETECTED   Tetrahydrocannabinol NONE DETECTED NONE DETECTED   Barbiturates NONE DETECTED NONE DETECTED    Comment: (NOTE) DRUG SCREEN FOR MEDICAL PURPOSES ONLY.  IF CONFIRMATION IS NEEDED FOR ANY PURPOSE, NOTIFY LAB WITHIN 5 DAYS. LOWEST DETECTABLE LIMITS FOR URINE DRUG SCREEN Drug Class                     Cutoff (ng/mL) Amphetamine and metabolites    1000 Barbiturate and metabolites    200 Benzodiazepine                 239 Tricyclics and metabolites     300 Opiates and metabolites        300 Cocaine and metabolites        300 THC                            50 Performed at [facility] [place], [place] [address]   Urinalysis, Routine w reflex microscopic     Status: Abnormal   Collection Time: 07/24/18  9:44 PM  Result Value Ref Range   Color, Urine YELLOW YELLOW   APPearance CLEAR CLEAR   Specific Gravity, Urine 1.014 1.005 - 1.030   pH 5.0 5.0 - 8.0   Glucose, UA NEGATIVE NEGATIVE mg/dL   Hgb urine dipstick NEGATIVE NEGATIVE   Bilirubin Urine NEGATIVE NEGATIVE   Ketones, ur 5 (A) NEGATIVE mg/dL   Protein, ur NEGATIVE NEGATIVE mg/dL   Nitrite NEGATIVE NEGATIVE   Leukocytes,Ua SMALL (A) NEGATIVE   RBC / HPF 0-5 0 - 5 RBC/hpf   WBC, UA 11-20 0 - 5 WBC/hpf    Bacteria, UA RARE (A) NONE SEEN   Squamous Epithelial / LPF 0-5 0 - 5   Mucus PRESENT     Comment: Performed at [facility], [address] [place], [place] [address]  Lipid panel     Status: None   Collection Time: 07/25/18  5:10 AM  Result Value Ref Range   Cholesterol 111 0 - 200 mg/dL   Triglycerides 71 <150 mg/dL   HDL 44 >40 mg/dL   Total CHOL/HDL Ratio  2.5 RATIO   VLDL 14 0 - 40 mg/dL   LDL Cholesterol 53 0 - 99 mg/dL    Comment:        Total Cholesterol/HDL:CHD Risk Coronary Heart Disease Risk Table                     Men   Women  1/2 Average Risk   3.4   3.3  Average Risk       5.0   4.4  2 X Average Risk   9.6   7.1  3 X Average Risk  23.4   11.0        Use the calculated Patient Ratio above and the CHD Risk Table to determine the patient's CHD Risk.        ATP III CLASSIFICATION (LDL):  <100     mg/dL   Optimal  100-129  mg/dL   Near or Above                    Optimal  130-159  mg/dL   Borderline  160-189  mg/dL   High  >190     mg/dL   Very High Performed at [facility], [address] [place], [place] [address]   CBC     Status: Abnormal   Collection Time: 07/25/18  5:10 AM  Result Value Ref Range   WBC 7.6 4.0 - 10.5 K/uL   RBC 4.21 3.87 - 5.11 MIL/uL   Hemoglobin 10.7 (L) 12.0 - 15.0 g/dL   HCT 37.3 36.0 - 46.0 %   MCV 88.6 80.0 - 100.0 fL   MCH 25.4 (L) 26.0 - 34.0 pg   MCHC 28.7 (L) 30.0 - 36.0 g/dL   RDW 14.8 11.5 - 15.5 %   Platelets 255 150 - 400 K/uL   nRBC 0.0 0.0 - 0.2 %    Comment: Performed at [facility], [address], [place], [place] [address]  Basic metabolic panel     Status: Abnormal   Collection Time: 07/25/18  5:10 AM  Result Value Ref Range   Sodium 145 135 - 145 mmol/L   Potassium 3.8 3.5 - 5.1 mmol/L   Chloride 110 98 - 111 mmol/L   CO2 26 22 - 32 mmol/L   Glucose, Bld 99 70 - 99 mg/dL   BUN 18 8 - 23 mg/dL   Creatinine, Ser 1.27 (H) 0.44 - 1.00 mg/dL   Calcium 8.4 (L) 8.9 - 10.3 mg/dL   GFR calc non Af Amer 43 (L)  >60 mL/min   GFR calc Af Amer 50 (L) >60 mL/min   Anion gap 9 5 - 15    Comment: Performed at [facility], [address] [place], [place] [address]  Glucose, capillary     Status: None   Collection Time: 07/25/18  7:28 AM  Result Value Ref Range   Glucose-Capillary 89 70 - 99 mg/dL    ABGS Recent Labs    07/24/18 0832  TCO2 27   CULTURES Recent Results (from the past 240 hour(s))  SARS Coronavirus 2 (CEPHEID - Performed in [place] hospital lab), Hosp Order     Status: None   Collection Time: 07/24/18  8:34 AM  Result Value Ref Range Status   SARS Coronavirus 2 NEGATIVE NEGATIVE Final    Comment: (NOTE) If result is NEGATIVE SARS-CoV-2 target nucleic acids are NOT DETECTED. The SARS-CoV-2 RNA is generally detectable in upper and lower  respiratory specimens during the acute  phase of infection. The lowest  concentration of SARS-CoV-2 viral copies this assay can detect is 250  copies / mL. A negative result does not preclude SARS-CoV-2 infection  and should not be used as the sole basis for treatment or other  patient management decisions.  A negative result may occur with  improper specimen collection / handling, submission of specimen other  than nasopharyngeal swab, presence of viral mutation(s) within the  areas targeted by this assay, and inadequate number of viral copies  (<250 copies / mL). A negative result must be combined with clinical  observations, patient history, and epidemiological information. If result is POSITIVE SARS-CoV-2 target nucleic acids are DETECTED. The SARS-CoV-2 RNA is generally detectable in upper and lower  respiratory specimens dur ing the acute phase of infection.  Positive  results are indicative of active infection with SARS-CoV-2.  Clinical  correlation with patient history and other diagnostic information is  necessary to determine patient infection status.  Positive results do  not rule out bacterial infection or co-infection with  other viruses. If result is PRESUMPTIVE POSTIVE SARS-CoV-2 nucleic acids MAY BE PRESENT.   A presumptive positive result was obtained on the submitted specimen  and confirmed on repeat testing.  While 2019 novel coronavirus  (SARS-CoV-2) nucleic acids may be present in the submitted sample  additional confirmatory testing may be necessary for epidemiological  and / or clinical management purposes  to differentiate between  SARS-CoV-2 and other Sarbecovirus currently known to infect humans.  If clinically indicated additional testing with an alternate test  methodology [phone]) is advised. The SARS-CoV-2 RNA is generally  detectable in upper and lower respiratory sp ecimens during the acute  phase of infection. The expected result is Negative. Fact Sheet for Patients:  StrictlyIdeas.no Fact Sheet for Healthcare Providers: BankingDealers.co.za This test is not yet approved or cleared by the [place] FDA and has been authorized for detection and/or diagnosis of SARS-CoV-2 by FDA under an Emergency Use Authorization (EUA).  This EUA will remain in effect (meaning this test can be used) for the duration of the COVID-19 declaration under Section 564(b)(1) of the Act, 21 U.S.C. section 360bbb-3(b)(1), unless the authorization is terminated or revoked sooner. Performed at [facility], [address], [place], [place] [address]   MRSA PCR Screening     Status: None   Collection Time: 07/24/18  8:52 PM  Result Value Ref Range Status   MRSA by PCR NEGATIVE NEGATIVE Final    Comment:        The GeneXpert MRSA Assay (FDA approved for NASAL specimens only), is one component of a comprehensive MRSA colonization surveillance program. It is not intended to diagnose MRSA infection nor to guide or monitor treatment for MRSA infections. Performed at [facility], [address], [place], [place] [address]    Studies/Results: Mr Brain 89  Contrast  Result Date: 07/24/2018 CLINICAL DATA:  Acute presentation with speech disturbance. EXAM: MRI HEAD WITHOUT CONTRAST MRA HEAD WITHOUT CONTRAST TECHNIQUE: Multiplanar, multiecho pulse sequences of the brain and surrounding structures were obtained without intravenous contrast. Angiographic images of the head were obtained using MRA technique without contrast. COMPARISON:  Head CT earlier same day FINDINGS: MRI HEAD FINDINGS Brain: Diffusion imaging shows a 1.5 cm to 2 cm acute infarction affecting the left posterior frontal cortical and subcortical brain. There is also a punctate acute infarction in the right hippocampus. Otherwise, no acute finding is seen. No evidence of mass, hydrocephalus or extra-axial collection. Old left frontal  white matter infarction. Vascular: Major vessels at the base of the brain show flow. Skull and upper cervical spine: Negative Sinuses/Orbits: Clear Other: None MRA HEAD FINDINGS Both internal carotid arteries are widely patent into the brain. No siphon stenosis. The anterior and middle cerebral vessels are patent without proximal stenosis, aneurysm or vascular malformation. Both vertebral arteries are widely patent to the basilar. No basilar stenosis. Posterior circulation branch vessels appear normal. IMPRESSION: 1.5-2 cm region of acute infarction affecting the left posterior frontal cortical and subcortical brain consistent with MCA branch vessel infarction. Punctate acute infarction in the right hippocampus. Differing vascular distributions suggest embolic disease from the heart or ascending aorta. Negative intracranial MR angiography of the large and medium size vessels. Electronically Signed   By: Nelson Chimes M.D.   On: 07/24/2018 11:10   US Carotid Bilateral  Result Date: 07/24/2018 CLINICAL DATA:  Left-sided facial droop aphasia, hypertension EXAM: BILATERAL CAROTID DUPLEX ULTRASOUND TECHNIQUE: Pearline Cables scale imaging, color Doppler and duplex ultrasound were performed  of bilateral carotid and vertebral arteries in the neck. COMPARISON:  None. FINDINGS: Criteria: Quantification of carotid stenosis is based on velocity parameters that correlate the residual internal carotid diameter with NASCET-based stenosis levels, using the diameter of the distal internal carotid lumen as the denominator for stenosis measurement. The following velocity measurements were obtained: RIGHT ICA: 51/13 cm/sec CCA: 32/35 cm/sec SYSTOLIC ICA/CCA RATIO:  0.5 ECA: 57 cm/sec LEFT ICA: 81/20 cm/sec CCA: 573/22 cm/sec SYSTOLIC ICA/CCA RATIO:  0.8 ECA: 58 cm/sec RIGHT CAROTID ARTERY: Mild echogenic shadowing plaque formation. No hemodynamically significant right ICA stenosis, velocity elevation, or turbulent flow. Degree of narrowing less than 50%. RIGHT VERTEBRAL ARTERY:  Antegrade LEFT CAROTID ARTERY: Moderate scattered echogenic plaque formation. No hemodynamically significant left ICA stenosis, velocity elevation, or turbulent flow. LEFT VERTEBRAL ARTERY:  Antegrade IMPRESSION: Mild-to-moderate bilateral carotid atherosclerosis. No hemodynamically significant ICA stenosis. Degree of narrowing less than 50% bilaterally by ultrasound criteria. Patent antegrade vertebral flow Electronically Signed   By: Jerilynn Mages.  Shick M.D.   On: 07/24/2018 14:15   Mr Jodene Nam Head/brain GU Cm  Result Date: 07/24/2018 CLINICAL DATA:  Acute presentation with speech disturbance. EXAM: MRI HEAD WITHOUT CONTRAST MRA HEAD WITHOUT CONTRAST TECHNIQUE: Multiplanar, multiecho pulse sequences of the brain and surrounding structures were obtained without intravenous contrast. Angiographic images of the head were obtained using MRA technique without contrast. COMPARISON:  Head CT earlier same day FINDINGS: MRI HEAD FINDINGS Brain: Diffusion imaging shows a 1.5 cm to 2 cm acute infarction affecting the left posterior frontal cortical and subcortical brain. There is also a punctate acute infarction in the right hippocampus. Otherwise, no acute  finding is seen. No evidence of mass, hydrocephalus or extra-axial collection. Old left frontal white matter infarction. Vascular: Major vessels at the base of the brain show flow. Skull and upper cervical spine: Negative Sinuses/Orbits: Clear Other: None MRA HEAD FINDINGS Both internal carotid arteries are widely patent into the brain. No siphon stenosis. The anterior and middle cerebral vessels are patent without proximal stenosis, aneurysm or vascular malformation. Both vertebral arteries are widely patent to the basilar. No basilar stenosis. Posterior circulation branch vessels appear normal. IMPRESSION: 1.5-2 cm region of acute infarction affecting the left posterior frontal cortical and subcortical brain consistent with MCA branch vessel infarction. Punctate acute infarction in the right hippocampus. Differing vascular distributions suggest embolic disease from the heart or ascending aorta. Negative intracranial MR angiography of the large and medium size vessels. Electronically Signed   By: Nelson Chimes  M.D.   On: 07/24/2018 11:10   Ct Head Code Stroke Wo Contrast  Result Date: 07/24/2018 CLINICAL DATA:  Code stroke.  Speech difficulty EXAM: CT HEAD WITHOUT CONTRAST TECHNIQUE: Contiguous axial images were obtained from the base of the skull through the vertex without intravenous contrast. COMPARISON:  04/09/2015 FINDINGS: Brain: Remote appearing lacunar infarcts in the left centrum semiovale. Left thalamic lacunar infarct that is likely chronic based on appearance and history. Small vessel ischemic gliosis in the white matter. New generalized cerebral volume loss. No evidence of acute gray matter infarct, hemorrhage, hydrocephalus, or mass. Vascular: No hyperdense vessel. There is atherosclerotic calcification. Skull: Negative Sinuses/Orbits: Negative Other: These results were called by telephone at the time of interpretation on 07/24/2018 at 8:49 am to Dr. Francine Graven , who verbally acknowledged these  results. ASPECTS [facility] Stroke Program Early CT Score) - Ganglionic level infarction (caudate, lentiform nuclei, internal capsule, insula, M1-M3 cortex): 7 - Supraganglionic infarction (M4-M6 cortex): 3 Total score (0-10 with 10 being normal): 10 IMPRESSION: 1. Negative for hemorrhage or acute cortical infarct. ASPECTS is 10. 2. 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 These are new from 2017. 3. Cerebral volume loss since 2017. Electronically Signed   By: Monte Fantasia M.D.   On: 07/24/2018 08:53    Medications:  Prior to Admission:  Medications Prior to Admission  Medication Sig Dispense Refill Last Dose  . ammonium lactate (LAC-HYDRIN) 12 % lotion Apply topically 2 (two) times daily. 400 g 0   . aspirin 81 MG chewable tablet Chew 1 tablet (81 mg total) by mouth daily.   07/23/2018 at 900  . citalopram (CELEXA) 20 MG tablet Take 1 tablet (20 mg total) by mouth daily.   07/23/2018 at Unknown time  . gabapentin (NEURONTIN) 100 MG capsule Take 100 mg by mouth 3 (three) times daily.   07/23/2018 at Unknown time  . metFORMIN (GLUCOPHAGE) 1000 MG tablet Take 1 tablet by mouth 2 (two) times a day.   07/23/2018 at Unknown time  . Multiple Vitamin (MULTIVITAMIN WITH MINERALS) TABS tablet Take 1 tablet by mouth daily. *CENTRUM SILVER   07/23/2018 at Unknown time  . oxyCODONE-acetaminophen (PERCOCET/ROXICET) 5-325 MG per tablet Take 1 tablet by mouth 4 (four) times daily.    07/23/2018 at Unknown time  . senna (SENOKOT) 8.6 MG TABS tablet Take 1 tablet (8.6 mg total) by mouth daily as needed for mild constipation. 120 each 0 07/23/2018 at Unknown time  . verapamil (CALAN-SR) 180 MG CR tablet Take 1 tablet by mouth daily.   07/23/2018 at Unknown time  . XARELTO 20 MG TABS Take 20 mg by mouth daily with supper.    07/23/2018 at 2100  . lisinopril (PRINIVIL,ZESTRIL) 20 MG tablet Take 1 tablet (20 mg total) by mouth daily. (Patient not taking: Reported on  07/24/2018)   Not Taking at Unknown time  . sulfamethoxazole-trimethoprim (BACTRIM DS,SEPTRA DS) 800-160 MG tablet Take 1 tablet by mouth 2 (two) times daily. (Patient not taking: Reported on 07/24/2018) 30 tablet  Not Taking at Unknown time   Scheduled: .  stroke: mapping our early stages of recovery book   Does not apply Once  . aspirin  81 mg Oral Daily  . atorvastatin  20 mg Oral q1800  . celecoxib  200 mg Oral Daily  . citalopram  20 mg Oral Daily  . insulin aspart  0-5 Units Subcutaneous QHS  . insulin aspart  0-9 Units Subcutaneous  TID WC  . multivitamin with minerals  1 tablet Oral Daily  . rivaroxaban  20 mg Oral Q supper  . verapamil  240 mg Oral Daily   Continuous:  DPO:[id number] **OR** acetaminophen (TYLENOL) oral liquid 160 mg/5 mL **OR** acetaminophen, hydrALAZINE, oxyCODONE-acetaminophen, senna  Assesment: She was admitted with a stroke.  She is having speech difficulty.  Physical therapy has seen her and recommended skilled care facility and that is where she came from so that we will continue.  She has been chronically anticoagulated because of previous pulmonary embolus and her sedentary status  She has diabetes which is being treated  She has morbid obesity which is an ongoing problem  She has weakness of her lower extremities and has been wheelchair-bound and in the nursing home for the last 3 years Principal Problem:   CVA (cerebral vascular accident) [facility]) Active Problems:   Type 2 diabetes mellitus ([place])   Essential hypertension, benign   History of pulmonary embolus (PE)   Morbid obesity ([place])    Plan: Continue treatments.  Speech evaluation.  Probably back to the skilled care facility tomorrow    LOS: 1 day   Alonza Bogus 07/25/2018, 8:29 AM

## 2018-07-25 NOTE — NC FL2 (Signed)
[place] MEDICAID FL2 LEVEL OF CARE SCREENING TOOL     IDENTIFICATION  Patient Name: [name] Birthdate: 12-08-1950 Sex: female Admission Date (Current Location): 07/24/2018  [facility] and [place] Number:  [employer] and Address:  [place] [address], [place]      Provider Number: [id number]  Attending Physician Name and Address:  Sinda Du, MD  Relative Name and Phone Number:       Current Level of Care: Hospital Recommended Level of Care: [place] Prior Approval Number:    Date Approved/Denied:   PASRR Number: [id number]  Discharge Plan: SNF    Current Diagnoses: Patient Active Problem List   Diagnosis Date Noted  . CVA (cerebral vascular accident) ([place]) 07/24/2018  . Depression 04/12/2015  . TIA (transient ischemic attack) 04/09/2015  . Aphasia 04/09/2015  . Cellulitis and abscess of leg 04/09/2015  . 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  . Fever, unspecified 12/25/2014  . Sepsis ([place]) 12/25/2014  . Cellulitis 12/25/2014  . Essential hypertension, benign 12/20/2011  . History of pulmonary embolus (PE) 12/20/2011  . Morbid obesity ([place]) 12/20/2011  . Preoperative cardiovascular examination 12/20/2011  . Type 2 diabetes mellitus ([place]) 09/11/2006    Orientation RESPIRATION BLADDER Height & Weight     Self, Time, Situation, Place  Normal Incontinent Weight: 121.8 kg Height:  5' (152.4 cm)  BEHAVIORAL SYMPTOMS/MOOD NEUROLOGICAL BOWEL NUTRITION STATUS    (Weakness) Continent Diet(see discharge/ NPO during admission)  AMBULATORY STATUS COMMUNICATION OF NEEDS Skin   Extensive Assist Verbally Normal                       Personal Care Assistance Level of Assistance  Bathing, Feeding, Dressing Bathing Assistance: Maximum assistance Feeding assistance: Independent Dressing Assistance: Maximum assistance     Functional Limitations Info             SPECIAL CARE FACTORS FREQUENCY                        Contractures      Additional Factors Info  Code Status, Allergies Code Status Info: DNR Allergies Info: vancomycin           Current Medications (07/25/2018):  This is the current hospital active medication list Current Facility-Administered Medications  Medication Dose Route Frequency Provider Last Rate Last Dose  .  stroke: mapping our early stages of recovery book   Does not apply Once Manuella Ghazi, Pratik D, DO      . acetaminophen (TYLENOL) tablet 650 mg  650 mg Oral Q4H PRN Manuella Ghazi, Pratik D, DO       Or  . acetaminophen (TYLENOL) solution 650 mg  650 mg Per Tube Q4H PRN Manuella Ghazi, Pratik D, DO       Or  . acetaminophen (TYLENOL) suppository 650 mg  650 mg Rectal Q4H PRN Manuella Ghazi, Pratik D, DO      . aspirin chewable tablet 81 mg  81 mg Oral Daily Manuella Ghazi, Pratik D, DO   81 mg at 07/25/18 1040  . atorvastatin (LIPITOR) tablet 20 mg  20 mg Oral q1800 Manuella Ghazi, Pratik D, DO   20 mg at 07/24/18 1841  . celecoxib (CELEBREX) capsule 200 mg  200 mg Oral Daily Manuella Ghazi, Pratik D, DO   200 mg at 07/25/18 1040  . citalopram (CELEXA) tablet 20 mg  20 mg Oral Daily Manuella Ghazi, Pratik D, DO   20 mg at 07/25/18 1040  .  hydrALAZINE (APRESOLINE) injection 10 mg  10 mg Intravenous Q4H PRN Manuella Ghazi, Pratik D, DO      . insulin aspart (novoLOG) injection 0-5 Units  0-5 Units Subcutaneous QHS Manuella Ghazi, Pratik D, DO      . insulin aspart (novoLOG) injection 0-9 Units  0-9 Units Subcutaneous TID WC Shah, Pratik D, DO      . multivitamin with minerals tablet 1 tablet  1 tablet Oral Daily Manuella Ghazi, Pratik D, DO   1 tablet at 07/25/18 1040  . oxyCODONE-acetaminophen (PERCOCET/ROXICET) 5-325 MG per tablet 1 tablet  1 tablet Oral Q6H PRN Manuella Ghazi, Pratik D, DO      . rivaroxaban (XARELTO) tablet 20 mg  20 mg Oral Q supper Manuella Ghazi, Pratik D, DO   20 mg at 07/24/18 1841  . senna (SENOKOT) tablet 8.6 mg  1 tablet Oral Daily PRN Manuella Ghazi, Pratik D, DO      . verapamil (CALAN-SR) CR tablet 240 mg  240 mg Oral Daily Manuella Ghazi, Pratik D, DO   240 mg at  07/25/18 1040     Discharge Medications: Please see discharge summary for a list of discharge medications.  Relevant Imaging Results:  Relevant Lab Results:   Additional Information [id number]  Boneta Lucks, RN

## 2018-07-25 NOTE — Progress Notes (Signed)
OT Cancellation Note  Patient Details Name: [name] MRN: [id number] DOB: 13-Oct-1950   Cancelled Treatment:    Reason Eval/Treat Not Completed: OT screened, no needs identified, will sign off. Pt is at baseline with ADL completion. BUE strength is WFL and coordination is intact. Staff at [facility] with ADLs as needed. No further OT services required at this time.    Guadelupe Sabin, OTR/L  [phone] 07/25/2018, 9:10 AM

## 2018-07-25 NOTE — Consult Note (Signed)
[name] A. Merlene Laughter, MD     [email or web address]          [name] is an 68 y.o. female.   ASSESSMENT/PLAN: 1.  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  A 30-day cardiac event monitor is also recommended although she is on chronic anticoagulation because of DVT.  2.  Chronic gait impairment likely multifactorial including obesity, diabetic neuropathy, severe osteoarthritis and possible cervical myelopathy.  Cervical spine MRI is recommended.     Patient is a 68 year old right-handed black female who presents with the acute onset of severe dysarthria and dysphagia.  The patient's symptoms have improved today after being hospitalized.  She has a history of being bedridden for about 3 years.  The reasons are apparently multifactorial including severe osteoarthritis and obesity.  The patient does not report having palpitation, chest pain, headaches, blurry vision, shortness of breath GI/GU symptoms with her event.  She is on anticoagulation because of pulmonary embolism.  She has been on this for quite a while.  She is currently staying at a facility and compliance is not an issue.  The review of systems otherwise negative.   GENERAL: This is a pleasant morbidly obese female who is in no acute distress.  HEENT: Neck is supple no trauma appreciated.  ABDOMEN: soft  EXTREMITIES: No edema; there is severe arthritic changes of the knees.  There is also moderate arthritic changes of the other joints.  BACK: Normal  SKIN: Normal by inspection.    MENTAL STATUS: Alert and oriented. Speech -moderately dysarthric, language and cognition are generally intact. Judgment and insight normal.   CRANIAL NERVES: Pupils are equal, round and reactive to light and accomodation; extra ocular movements are full, there is no significant nystagmus; visual fields are full; there is moderate  weakness of the lower right facial muscles associated with flattening of the nasolabial fold.  Tongue is midline; uvula is midline; shoulder elevation is normal.  MOTOR: Bulk and tone are normal throughout.  There is mild to moderate global weakness throughout.  Deltoids are 4-.  Right hand grip is 4- in the left hand grip 4+.  Hip flexion are 4+ bilaterally.  Dorsiflexion on the right is 5 and on the left 4+.  COORDINATION: Left finger to nose is normal, right finger to nose is normal, No rest tremor; no intention tremor; no postural tremor; no bradykinesia.  REFLEXES: Deep tendon reflexes are symmetrical and normal but absent at the knees and 1+ at the ankles.   SENSATION: Normal to light touch, temperature, and pain.    Blood pressure (!) 172/84, pulse 81, temperature 98.2 F (36.8 C), temperature source Oral, resp. rate 16, height 5' (1.524 m), weight 121.8 kg, SpO2 100 %.  Past Medical History:  Diagnosis Date  . Cellulitis lower extremities   4 day hospitalization 12/25/14-12/29/14  . Essential hypertension, benign   . History of MRSA infection   . History of panniculitis   . Pulmonary emboli ([place]) 2009  . Skin ulcer of thigh ([place])   . Type 2 diabetes mellitus ([place])     Past Surgical History:  Procedure Laterality Date  . PARTIAL HYSTERECTOMY    . Right thigh ulcer debridement  2008   Two occasions - wound VAC    Family History  Problem Relation Age of Onset  . Cancer - Colon Father   . Heart disease Mother   . Diabetes Mellitus II Mother  Social History:  reports that she has never smoked. She has never used smokeless tobacco. She reports that she does not drink alcohol or use drugs.  Allergies:  Allergies  Allergen Reactions  . Vancomycin Hives and Rash    "big red bumps, then they turned white, then I shedded like a snake."     Medications: Prior to Admission medications   Medication Sig Start Date End Date Taking? Authorizing Provider  ammonium lactate  (LAC-HYDRIN) 12 % lotion Apply topically 2 (two) times daily. 04/13/15  Yes Sinda Du, MD  aspirin 81 MG chewable tablet Chew 1 tablet (81 mg total) by mouth daily. 04/13/15  Yes Sinda Du, MD  citalopram (CELEXA) 20 MG tablet Take 1 tablet (20 mg total) by mouth daily. 04/13/15  Yes Sinda Du, MD  gabapentin (NEURONTIN) 100 MG capsule Take 100 mg by mouth 3 (three) times daily. 07/19/18  Yes [provider]  metFORMIN (GLUCOPHAGE) 1000 MG tablet Take 1 tablet by mouth 2 (two) times a day. 06/30/18  Yes [provider]  Multiple Vitamin (MULTIVITAMIN WITH MINERALS) TABS tablet Take 1 tablet by mouth daily. *CENTRUM SILVER   Yes [provider]  oxyCODONE-acetaminophen (PERCOCET/ROXICET) 5-325 MG per tablet Take 1 tablet by mouth 4 (four) times daily.  11/23/11  Yes [provider]  senna (SENOKOT) 8.6 MG TABS tablet Take 1 tablet (8.6 mg total) by mouth daily as needed for mild constipation. 04/13/15  Yes Sinda Du, MD  verapamil (CALAN-SR) 180 MG CR tablet Take 1 tablet by mouth daily. 07/01/18  Yes [provider]  XARELTO 20 MG TABS Take 20 mg by mouth daily with supper.  11/29/11  Yes [provider]  lisinopril (PRINIVIL,ZESTRIL) 20 MG tablet Take 1 tablet (20 mg total) by mouth daily. Patient not taking: Reported on 07/24/2018 04/13/15   Sinda Du, MD  sulfamethoxazole-trimethoprim (BACTRIM DS,SEPTRA DS) 800-160 MG tablet Take 1 tablet by mouth 2 (two) times daily. Patient not taking: Reported on 07/24/2018 04/13/15   Sinda Du, MD    Scheduled Meds: .  stroke: mapping our early stages of recovery book   Does not apply Once  . aspirin  81 mg Oral Daily  . atorvastatin  20 mg Oral q1800  . celecoxib  200 mg Oral Daily  . citalopram  20 mg Oral Daily  . insulin aspart  0-5 Units Subcutaneous QHS  . insulin aspart  0-9 Units Subcutaneous TID WC  . multivitamin with minerals  1 tablet Oral Daily  . rivaroxaban  20  mg Oral Q supper  . verapamil  240 mg Oral Daily   Continuous Infusions: PRN Meds:.acetaminophen **OR** acetaminophen (TYLENOL) oral liquid 160 mg/5 mL **OR** acetaminophen, hydrALAZINE, oxyCODONE-acetaminophen, senna     Results for orders placed or performed during the hospital encounter of 07/24/18 (from the past 48 hour(s))  CBG monitoring, ED     Status: Abnormal   Collection Time: 07/24/18  8:17 AM  Result Value Ref Range   Glucose-Capillary 109 (H) 70 - 99 mg/dL  Reticulocytes     Status: None   Collection Time: 07/24/18  8:30 AM  Result Value Ref Range   Retic Ct Pct 0.8 0.4 - 3.1 %   RBC. 4.52 3.87 - 5.11 MIL/uL   Retic Count, Absolute 34.8 19.0 - 186.0 K/uL   Immature Retic Fract 13.9 2.3 - 15.9 %    Comment: Performed at [facility], [address] [place], [place] [address]  [place], ED  Status: Abnormal   Collection Time: 07/24/18  8:32 AM  Result Value Ref Range   Sodium 143 135 - 145 mmol/L   Potassium 4.0 3.5 - 5.1 mmol/L   Chloride 108 98 - 111 mmol/L   BUN 19 8 - 23 mg/dL   Creatinine, Ser 1.40 (H) 0.44 - 1.00 mg/dL   Glucose, Bld 110 (H) 70 - 99 mg/dL   Calcium, Ion 1.12 (L) 1.15 - 1.40 mmol/L   TCO2 27 22 - 32 mmol/L   Hemoglobin 13.6 12.0 - 15.0 g/dL   HCT 40.0 36.0 - 46.0 %  Ethanol     Status: None   Collection Time: 07/24/18  8:32 AM  Result Value Ref Range   Alcohol, Ethyl (B) <10 <10 mg/dL    Comment: (NOTE) Lowest detectable limit for serum alcohol is 10 mg/dL. For medical purposes only. Performed at [facility], [address], [place], [place] [address]   Protime-INR     Status: Abnormal   Collection Time: 07/24/18  8:32 AM  Result Value Ref Range   Prothrombin Time 15.4 (H) 11.4 - 15.2 seconds   INR 1.2 0.8 - 1.2    Comment: (NOTE) INR goal varies based on device and disease states. Performed at [facility], [address] [place], [place] [address]   APTT     Status: None   Collection Time: 07/24/18  8:32 AM  Result  Value Ref Range   aPTT 28 24 - 36 seconds    Comment: Performed at [facility], [address], [place], [place] [address]  CBC     Status: Abnormal   Collection Time: 07/24/18  8:32 AM  Result Value Ref Range   WBC 8.2 4.0 - 10.5 K/uL   RBC 4.59 3.87 - 5.11 MIL/uL   Hemoglobin 11.8 (L) 12.0 - 15.0 g/dL   HCT 40.4 36.0 - 46.0 %   MCV 88.0 80.0 - 100.0 fL   MCH 25.7 (L) 26.0 - 34.0 pg   MCHC 29.2 (L) 30.0 - 36.0 g/dL   RDW 15.1 11.5 - 15.5 %   Platelets 267 150 - 400 K/uL   nRBC 0.0 0.0 - 0.2 %    Comment: Performed at [facility], [address] [place], [place] [address]  Differential     Status: None   Collection Time: 07/24/18  8:32 AM  Result Value Ref Range   Neutrophils Relative % 55 %   Neutro Abs 4.5 1.7 - 7.7 K/uL   Lymphocytes Relative 37 %   Lymphs Abs 3.0 0.7 - 4.0 K/uL   Monocytes Relative 6 %   Monocytes Absolute 0.5 0.1 - 1.0 K/uL   Eosinophils Relative 2 %   Eosinophils Absolute 0.2 0.0 - 0.5 K/uL   Basophils Relative 0 %   Basophils Absolute 0.0 0.0 - 0.1 K/uL   Immature Granulocytes 0 %   Abs Immature Granulocytes 0.03 0.00 - 0.07 K/uL    Comment: Performed at [facility], [address], [place], [place] [address]  Comprehensive metabolic panel     Status: Abnormal   Collection Time: 07/24/18  8:32 AM  Result Value Ref Range   Sodium 143 135 - 145 mmol/L   Potassium 3.9 3.5 - 5.1 mmol/L   Chloride 107 98 - 111 mmol/L   CO2 25 22 - 32 mmol/L   Glucose, Bld 113 (H) 70 - 99 mg/dL   BUN 20 8 - 23 mg/dL   Creatinine, Ser 1.38 (H) 0.44 - 1.00 mg/dL  Calcium 8.7 (L) 8.9 - 10.3 mg/dL   Total Protein 7.3 6.5 - 8.1 g/dL   Albumin 3.4 (L) 3.5 - 5.0 g/dL   AST 11 (L) 15 - 41 U/L   ALT 11 0 - 44 U/L   Alkaline Phosphatase 50 38 - 126 U/L   Total Bilirubin 0.4 0.3 - 1.2 mg/dL   GFR calc non Af Amer 39 (L) >60 mL/min   GFR calc Af Amer 45 (L) >60 mL/min   Anion gap 11 5 - 15    Comment: Performed at [facility], [address], [place], [place] [address]   SARS Coronavirus 2 (CEPHEID - Performed in [place] hospital lab), Hosp Order     Status: None   Collection Time: 07/24/18  8:34 AM  Result Value Ref Range   SARS Coronavirus 2 NEGATIVE NEGATIVE    Comment: (NOTE) If result is NEGATIVE SARS-CoV-2 target nucleic acids are NOT DETECTED. The SARS-CoV-2 RNA is generally detectable in upper and lower  respiratory specimens during the acute phase of infection. The lowest  concentration of SARS-CoV-2 viral copies this assay can detect is 250  copies / mL. A negative result does not preclude SARS-CoV-2 infection  and should not be used as the sole basis for treatment or other  patient management decisions.  A negative result may occur with  improper specimen collection / handling, submission of specimen other  than nasopharyngeal swab, presence of viral mutation(s) within the  areas targeted by this assay, and inadequate number of viral copies  (<250 copies / mL). A negative result must be combined with clinical  observations, patient history, and epidemiological information. If result is POSITIVE SARS-CoV-2 target nucleic acids are DETECTED. The SARS-CoV-2 RNA is generally detectable in upper and lower  respiratory specimens dur ing the acute phase of infection.  Positive  results are indicative of active infection with SARS-CoV-2.  Clinical  correlation with patient history and other diagnostic information is  necessary to determine patient infection status.  Positive results do  not rule out bacterial infection or co-infection with other viruses. If result is PRESUMPTIVE POSTIVE SARS-CoV-2 nucleic acids MAY BE PRESENT.   A presumptive positive result was obtained on the submitted specimen  and confirmed on repeat testing.  While 2019 novel coronavirus  (SARS-CoV-2) nucleic acids may be present in the submitted sample  additional confirmatory testing may be necessary for epidemiological  and / or clinical management purposes  to  differentiate between  SARS-CoV-2 and other Sarbecovirus currently known to infect humans.  If clinically indicated additional testing with an alternate test  methodology [phone]) is advised. The SARS-CoV-2 RNA is generally  detectable in upper and lower respiratory sp ecimens during the acute  phase of infection. The expected result is Negative. Fact Sheet for Patients:  StrictlyIdeas.no Fact Sheet for Healthcare Providers: BankingDealers.co.za This test is not yet approved or cleared by the [place] FDA and has been authorized for detection and/or diagnosis of SARS-CoV-2 by FDA under an Emergency Use Authorization (EUA).  This EUA will remain in effect (meaning this test can be used) for the duration of the COVID-19 declaration under Section 564(b)(1) of the Act, 21 U.S.C. section 360bbb-3(b)(1), unless the authorization is terminated or revoked sooner. Performed at [facility], [address], [place], [place] [address]   HIV antibody (Routine Testing)     Status: None   Collection Time: 07/24/18  9:48 AM  Result Value Ref Range   HIV Screen 4th Generation wRfx Non  Reactive Non Reactive    Comment: (NOTE) Performed At: [facility] [place], [place] [id number] Rush Farmer MD KG:[id number]   Vitamin B12     Status: Abnormal   Collection Time: 07/24/18  1:33 PM  Result Value Ref Range   Vitamin B-12 1,078 (H) 180 - 914 pg/mL    Comment: (NOTE) This assay is not validated for testing neonatal or myeloproliferative syndrome specimens for Vitamin B12 levels. Performed at [facility], [address] [place], [place] [address]   Iron and TIBC     Status: Abnormal   Collection Time: 07/24/18  1:33 PM  Result Value Ref Range   Iron 35 28 - 170 ug/dL   TIBC 239 (L) 250 - 450 ug/dL   Saturation Ratios 15 10.4 - 31.8 %   UIBC 204 ug/dL    Comment: Performed at [facility], [address],  [place], [place] [address]  Ferritin     Status: None   Collection Time: 07/24/18  1:33 PM  Result Value Ref Range   Ferritin 110 11 - 307 ng/mL    Comment: Performed at [facility], [address], [place], [place] [address]  Folate     Status: None   Collection Time: 07/24/18  1:34 PM  Result Value Ref Range   Folate 13.4 >5.9 ng/mL    Comment: Performed at [facility], [address], [place], [place] [address]  Glucose, capillary     Status: None   Collection Time: 07/24/18  4:27 PM  Result Value Ref Range   Glucose-Capillary 88 70 - 99 mg/dL   Comment 1 Notify RN    Comment 2 Document in Chart   MRSA PCR Screening     Status: None   Collection Time: 07/24/18  8:52 PM  Result Value Ref Range   MRSA by PCR NEGATIVE NEGATIVE    Comment:        The GeneXpert MRSA Assay (FDA approved for NASAL specimens only), is one component of a comprehensive MRSA colonization surveillance program. It is not intended to diagnose MRSA infection nor to guide or monitor treatment for MRSA infections. Performed at [facility], [address], [place], [place] [address]   Glucose, capillary     Status: None   Collection Time: 07/24/18  9:13 PM  Result Value Ref Range   Glucose-Capillary 89 70 - 99 mg/dL  Urine rapid drug screen (hosp performed)not at [facility]     Status: None   Collection Time: 07/24/18  9:44 PM  Result Value Ref Range   Opiates NONE DETECTED NONE DETECTED   Cocaine NONE DETECTED NONE DETECTED   Benzodiazepines NONE DETECTED NONE DETECTED   Amphetamines NONE DETECTED NONE DETECTED   Tetrahydrocannabinol NONE DETECTED NONE DETECTED   Barbiturates NONE DETECTED NONE DETECTED    Comment: (NOTE) DRUG SCREEN FOR MEDICAL PURPOSES ONLY.  IF CONFIRMATION IS NEEDED FOR ANY PURPOSE, NOTIFY LAB WITHIN 5 DAYS. LOWEST DETECTABLE LIMITS FOR URINE DRUG SCREEN Drug Class                     Cutoff (ng/mL) Amphetamine and metabolites    1000 Barbiturate and metabolites    200  Benzodiazepine                 884 Tricyclics and metabolites     300 Opiates and metabolites        300 Cocaine and metabolites        300 THC  51 Performed at [facility], [address] [place], [place] [address]   Urinalysis, Routine w reflex microscopic     Status: Abnormal   Collection Time: 07/24/18  9:44 PM  Result Value Ref Range   Color, Urine YELLOW YELLOW   APPearance CLEAR CLEAR   Specific Gravity, Urine 1.014 1.005 - 1.030   pH 5.0 5.0 - 8.0   Glucose, UA NEGATIVE NEGATIVE mg/dL   Hgb urine dipstick NEGATIVE NEGATIVE   Bilirubin Urine NEGATIVE NEGATIVE   Ketones, ur 5 (A) NEGATIVE mg/dL   Protein, ur NEGATIVE NEGATIVE mg/dL   Nitrite NEGATIVE NEGATIVE   Leukocytes,Ua SMALL (A) NEGATIVE   RBC / HPF 0-5 0 - 5 RBC/hpf   WBC, UA 11-20 0 - 5 WBC/hpf   Bacteria, UA RARE (A) NONE SEEN   Squamous Epithelial / LPF 0-5 0 - 5   Mucus PRESENT     Comment: Performed at [facility], [address] [place], [place] [address]  Lipid panel     Status: None   Collection Time: 07/25/18  5:10 AM  Result Value Ref Range   Cholesterol 111 0 - 200 mg/dL   Triglycerides 71 <150 mg/dL   HDL 44 >40 mg/dL   Total CHOL/HDL Ratio 2.5 RATIO   VLDL 14 0 - 40 mg/dL   LDL Cholesterol 53 0 - 99 mg/dL    Comment:        Total Cholesterol/HDL:CHD Risk Coronary Heart Disease Risk Table                     Men   Women  1/2 Average Risk   3.4   3.3  Average Risk       5.0   4.4  2 X Average Risk   9.6   7.1  3 X Average Risk  23.4   11.0        Use the calculated Patient Ratio above and the CHD Risk Table to determine the patient's CHD Risk.        ATP III CLASSIFICATION (LDL):  <100     mg/dL   Optimal  100-129  mg/dL   Near or Above                    Optimal  130-159  mg/dL   Borderline  160-189  mg/dL   High  >190     mg/dL   Very High Performed at [place] [place], [place] [address]   CBC     Status: Abnormal   Collection Time:  07/25/18  5:10 AM  Result Value Ref Range   WBC 7.6 4.0 - 10.5 K/uL   RBC 4.21 3.87 - 5.11 MIL/uL   Hemoglobin 10.7 (L) 12.0 - 15.0 g/dL   HCT 37.3 36.0 - 46.0 %   MCV 88.6 80.0 - 100.0 fL   MCH 25.4 (L) 26.0 - 34.0 pg   MCHC 28.7 (L) 30.0 - 36.0 g/dL   RDW 14.8 11.5 - 15.5 %   Platelets 255 150 - 400 K/uL   nRBC 0.0 0.0 - 0.2 %    Comment: Performed at [facility], [address] [place],  [address]  Basic metabolic panel     Status: Abnormal   Collection Time: 07/25/18  5:10 AM  Result Value Ref Range   Sodium 145 135 - 145 mmol/L   Potassium 3.8 3.5 - 5.1 mmol/L   Chloride 110 98 - 111 mmol/L  CO2 26 22 - 32 mmol/L   Glucose, Bld 99 70 - 99 mg/dL   BUN 18 8 - 23 mg/dL   Creatinine, Ser 1.27 (H) 0.44 - 1.00 mg/dL   Calcium 8.4 (L) 8.9 - 10.3 mg/dL   GFR calc non Af Amer 43 (L) >60 mL/min   GFR calc Af Amer 50 (L) >60 mL/min   Anion gap 9 5 - 15    Comment: Performed at [facility], [address], [place], [place] [address]  Glucose, capillary     Status: None   Collection Time: 07/25/18  7:28 AM  Result Value Ref Range   Glucose-Capillary 89 70 - 99 mg/dL  Glucose, capillary     Status: None   Collection Time: 07/25/18 11:45 AM  Result Value Ref Range   Glucose-Capillary 95 70 - 99 mg/dL    Studies/Results:  CAROTID IMPRESSION: Mild-to-moderate bilateral carotid atherosclerosis. No hemodynamically significant ICA stenosis. Degree of narrowing less than 50% bilaterally by ultrasound criteria.   MRA MRI BRAIN FINDINGS: MRI HEAD FINDINGS  Brain: Diffusion imaging shows a 1.5 cm to 2 cm acute infarction affecting the left posterior frontal cortical and subcortical brain. There is also a punctate acute infarction in the right hippocampus. Otherwise, no acute finding is seen. No evidence of mass, hydrocephalus or extra-axial collection. Old left frontal white matter infarction.  Vascular: Major vessels at the base of the brain show flow.  Skull  and upper cervical spine: Negative  Sinuses/Orbits: Clear  Other: None  MRA HEAD FINDINGS  Both internal carotid arteries are widely patent into the brain. No siphon stenosis. The anterior and middle cerebral vessels are patent without proximal stenosis, aneurysm or vascular malformation.  Both vertebral arteries are widely patent to the basilar. No basilar stenosis. Posterior circulation branch vessels appear normal.  IMPRESSION: 1.5-2 cm region of acute infarction affecting the left posterior frontal cortical and subcortical brain consistent with MCA branch vessel infarction.  Punctate acute infarction in the right hippocampus. Differing vascular distributions suggest embolic disease from the heart or ascending aorta.  Negative intracranial MR angiography of the large and medium size Vessels.     The brain MRI and MRA are reviewed in person.  Not all scans are carried out.  I do not see T1 or SWI.  There is a cortical base infarct involving the left posterior frontal area approximating the precentral gyri.  This is seen on DWI.  There is also a smaller lesion involving the temporal region on the right side.  There is an area of moderate size deep white matter infarct with encephalomalacia involving the centrum semiovale left side.  MRA looks very good with no disease seen throughout.    Sharlon Pfohl A. Merlene [name], M.D.  Diplomate, Tax adviser of Psychiatry and Neurology ( Neurology). 07/25/2018, 11:53 AM

## 2018-07-25 NOTE — Evaluation (Signed)
Speech Language Pathology Evaluation Patient Details Name: [name] MRN: [id number] DOB: 1950-03-25 Today's Date: 07/25/2018 Time: 1208-1222 SLP Time Calculation (min) (ACUTE ONLY): 14 min  Problem List:  Patient Active Problem List   Diagnosis Date Noted  . CVA (cerebral vascular accident) ([place]) 07/24/2018  . Depression 04/12/2015  . TIA (transient ischemic attack) 04/09/2015  . Aphasia 04/09/2015  . Cellulitis and abscess of leg 04/09/2015  . 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  . Fever, unspecified 12/25/2014  . Sepsis ([place]) 12/25/2014  . Cellulitis 12/25/2014  . Essential hypertension, benign 12/20/2011  . History of pulmonary embolus (PE) 12/20/2011  . Morbid obesity ([place]) 12/20/2011  . Preoperative cardiovascular examination 12/20/2011  . Type 2 diabetes mellitus ([place]) 09/11/2006   Past Medical History:  Past Medical History:  Diagnosis Date  . Cellulitis lower extremities   4 day hospitalization 12/25/14-12/29/14  . Essential hypertension, benign   . History of MRSA infection   . History of panniculitis   . Pulmonary emboli ([place]) 2009  . Skin ulcer of thigh ([place])   . Type 2 diabetes mellitus ([place])    Past Surgical History:  Past Surgical History:  Procedure Laterality Date  . PARTIAL HYSTERECTOMY    . Right thigh ulcer debridement  2008   Two occasions - wound VAC   HPI:  Patient is a 68 year old right-handed black female who presents with the acute onset of severe dysarthria and dysphagia.  The patient's symptoms have improved today after being hospitalized.  She has a history of being bedridden for about 3 years.  The reasons are apparently multifactorial including severe osteoarthritis and obesity.  The patient does not report having palpitation, chest pain, headaches, blurry vision, shortness of breath GI/GU symptoms with her event.  She is on anticoagulation because of pulmonary embolism.  She has been on this for quite a while.  She is currently staying at a  facility and compliance is not an issue.  The review of systems otherwise negative. BSE and SLE requested.   Assessment / Plan / Recommendation Clinical Impression  SLE completed. Pt presents with moderate dysarthria right facial weakness impacting speech intelligibility at the conversation level. Recommend f/u SLP services at SNF to address as Pt is highly motivated to improve.    SLP Assessment  SLP Recommendation/Assessment: All further Speech Lanaguage Pathology  needs can be addressed in the next venue of care SLP Visit Diagnosis: Dysarthria and anarthria (R47.1)    Follow Up Recommendations  Skilled Nursing facility(for dysarthria)    Frequency and Duration           SLP Evaluation Cognition  Overall Cognitive Status: Within Functional Limits for tasks assessed Arousal/Alertness: Awake/alert Orientation Level: Oriented X4 Memory: Appears intact Awareness: Appears intact Problem Solving: Appears intact Safety/Judgment: Appears intact       Comprehension  Auditory Comprehension Overall Auditory Comprehension: Appears within functional limits for tasks assessed Yes/No Questions: Within Functional Limits Visual Recognition/Discrimination Discrimination: Not tested Reading Comprehension Reading Status: Not tested    Expression Expression Primary Mode of Expression: Verbal Verbal Expression Overall Verbal Expression: Appears within functional limits for tasks assessed Initiation: No impairment Automatic Speech: Name;Social Response Level of Generative/Spontaneous Verbalization: Conversation Repetition: No impairment Naming: No impairment Pragmatics: No impairment Interfering Components: Speech intelligibility Non-Verbal Means of Communication: Not applicable Written Expression Dominant Hand: Right Written Expression: Not tested   Oral / Motor  Oral Motor/Sensory Function Overall Oral Motor/Sensory Function: Moderate impairment Facial ROM: Reduced right;Suspected  CN VII (facial) dysfunction Facial Symmetry: Abnormal  symmetry right;Suspected CN VII (facial) dysfunction Facial Strength: Reduced right;Suspected CN VII (facial) dysfunction Facial Sensation: Within Functional Limits Lingual ROM: Within Functional Limits Lingual Symmetry: Within Functional Limits Lingual Strength: Reduced Lingual Sensation: Within Functional Limits Velum: Within Functional Limits Mandible: Within Functional Limits Motor Speech Overall Motor Speech: Impaired Respiration: Within functional limits Phonation: Normal Resonance: Within functional limits Articulation: Impaired Intelligibility: Intelligibility reduced Word: 75-100% accurate Phrase: 75-100% accurate Sentence: 75-100% accurate Conversation: 75-100% accurate Motor Planning: Witnin functional limits Motor Speech Errors: Aware   Thank you,  Genene Churn, [place]                     [place] 07/25/2018, 1:57 PM

## 2018-07-25 NOTE — Progress Notes (Signed)
*  PRELIMINARY RESULTS* Echocardiogram 2D Echocardiogram has been performed.  Leavy Cella 07/25/2018, 9:55 AM

## 2018-07-25 NOTE — Progress Notes (Signed)
SLP Note  Patient Details Name: AMALI UHLS MRN: [id number] DOB: 1950/03/04   SLP Note:        Pt passed [facility] swallow screen and should be on a diet. SLP only has an order for a speech/language evaluation NOT a BSE/clinical swallow evaluation- unsure why pt is still NPO. SLP will call RN. If you want BSE, please place order.  Thank you,  Genene Churn, [place]    [place] 07/25/2018, 9:47 AM

## 2018-07-25 NOTE — Evaluation (Signed)
Clinical/Bedside Swallow Evaluation Patient Details  Name: [name] MRN: [id number] Date of Birth: [birth year]  Today's Date: 07/25/2018 Time: SLP Start Time (ACUTE ONLY): 1150 SLP Stop Time (ACUTE ONLY): 1208 SLP Time Calculation (min) (ACUTE ONLY): 18 min  Past Medical History:  Past Medical History:  Diagnosis Date  . Cellulitis lower extremities   4 day hospitalization 12/25/14-12/29/14  . Essential hypertension, benign   . History of MRSA infection   . History of panniculitis   . Pulmonary emboli ([place]) 2009  . Skin ulcer of thigh ([place])   . Type 2 diabetes mellitus ([place])    Past Surgical History:  Past Surgical History:  Procedure Laterality Date  . PARTIAL HYSTERECTOMY    . Right thigh ulcer debridement  2008   Two occasions - wound VAC   HPI:  Patient is a 68 year old right-handed black female who presents with the acute onset of severe dysarthria and dysphagia.  The patient's symptoms have improved today after being hospitalized.  She has a history of being bedridden for about 3 years.  The reasons are apparently multifactorial including severe osteoarthritis and obesity.  The patient does not report having palpitation, chest pain, headaches, blurry vision, shortness of breath GI/GU symptoms with her event.  She is on anticoagulation because of pulmonary embolism.  She has been on this for quite a while.  She is currently staying at a facility and compliance is not an issue.  The review of systems otherwise negative. BSE and SLE requested.   Assessment / Plan / Recommendation Clinical Impression  Clinical swallow evaluation completed due to Pt with report of dysphagia, however she passed the [facility] swallow screen in the ED. Pt presents with oral dysphagia due to right facial weakness and suspected normal pharyngeal phase, however cannot be confirmed at bedside. Pt compensates well for weakness by taking small bites/sips and going slowly. Recommend regular textures and thin liquids  with standard aspiration and reflux precautions. Recommend f/u SLP services at SNF for dysarthria.  SLP Visit Diagnosis: Dysphagia, oral phase (R13.11)    Aspiration Risk  Mild aspiration risk    Diet Recommendation Regular;Thin liquid   Liquid Administration via: Cup;Straw Medication Administration: Whole meds with liquid Supervision: Patient able to self feed;Intermittent supervision to cue for compensatory strategies Compensations: Slow rate;Small sips/bites Postural Changes: Seated upright at 90 degrees;Remain upright for at least 30 minutes after po intake    Other  Recommendations Oral Care Recommendations: Oral care BID;Patient independent with oral care Other Recommendations: Clarify dietary restrictions   Follow up Recommendations Skilled Nursing facility(for dysarthria)      Frequency and Duration            Prognosis Prognosis for Safe Diet Advancement: Good      Swallow Study   General Date of Onset: 07/24/18 HPI: Patient is a 68 year old right-handed black female who presents with the acute onset of severe dysarthria and dysphagia.  The patient's symptoms have improved today after being hospitalized.  She has a history of being bedridden for about 3 years.  The reasons are apparently multifactorial including severe osteoarthritis and obesity.  The patient does not report having palpitation, chest pain, headaches, blurry vision, shortness of breath GI/GU symptoms with her event.  She is on anticoagulation because of pulmonary embolism.  She has been on this for quite a while.  She is currently staying at a facility and compliance is not an issue.  The review of systems otherwise negative. BSE and SLE requested. Type of  Study: Bedside Swallow Evaluation Previous Swallow Assessment: None on record Diet Prior to this Study: NPO Temperature Spikes Noted: No Respiratory Status: Room air History of Recent Intubation: No Behavior/Cognition: Alert;Cooperative;Pleasant  mood Oral Cavity Assessment: Within Functional Limits Oral Care Completed by SLP: Yes Oral Cavity - Dentition: Dentures, top Vision: Functional for self-feeding Self-Feeding Abilities: Able to feed self Patient Positioning: Upright in bed Baseline Vocal Quality: Normal Volitional Cough: Strong Volitional Swallow: Able to elicit    Oral/Motor/Sensory Function Overall Oral Motor/Sensory Function: Moderate impairment Facial ROM: Reduced right;Suspected CN VII (facial) dysfunction Facial Symmetry: Abnormal symmetry right;Suspected CN VII (facial) dysfunction Facial Strength: Reduced right;Suspected CN VII (facial) dysfunction Facial Sensation: Within Functional Limits Lingual ROM: Within Functional Limits Lingual Symmetry: Within Functional Limits Lingual Strength: Reduced Lingual Sensation: Within Functional Limits Velum: Within Functional Limits Mandible: Within Functional Limits   Ice Chips Ice chips: Within functional limits Presentation: Spoon   Thin Liquid Thin Liquid: Within functional limits Presentation: Cup;Self Fed;Straw    Nectar Thick Nectar Thick Liquid: Not tested   Honey Thick Honey Thick Liquid: Not tested   Puree Puree: Within functional limits Presentation: Spoon   Solid     Solid: Within functional limits Presentation: Self Fed     Thank you,  Genene Churn, [place]  Marchia Diguglielmo 07/25/2018,1:49 PM

## 2018-07-26 LAB — GLUCOSE, CAPILLARY
Glucose-Capillary: 114 mg/dL — ABNORMAL HIGH (ref 70–99)
Glucose-Capillary: 124 mg/dL — ABNORMAL HIGH (ref 70–99)

## 2018-07-26 MED ORDER — ATORVASTATIN CALCIUM 80 MG PO TABS: 80.0000 mg | ORAL_TABLET | Freq: Every day | ORAL | Status: DC

## 2018-07-26 NOTE — Discharge Summary (Signed)
Physician Discharge Summary  Patient ID: TAMULA MORRICAL MRN: [id number] DOB/AGE: Dec 07, 1950 68 y.o. Primary Care Physician:Zanna Hawn, Percell Miller, MD Admit date: 07/24/2018 Discharge date: 07/26/2018    Discharge Diagnoses:   Principal Problem:   CVA (cerebral vascular accident) [facility]) Active Problems:   Type 2 diabetes mellitus ([place])   Essential hypertension, benign   History of pulmonary embolus ([place])   Morbid obesity ([place]) Chronic gait abnormality  Allergies as of 07/26/2018      Reactions   Vancomycin Hives, Rash   "big red bumps, then they turned white, then I shedded like a snake."       Medication List    STOP taking these medications   lisinopril 20 MG tablet Commonly known as:  ZESTRIL   sulfamethoxazole-trimethoprim 800-160 MG tablet Commonly known as:  BACTRIM DS     TAKE these medications   ammonium lactate 12 % lotion Commonly known as:  LAC-HYDRIN Apply topically 2 (two) times daily.   aspirin 81 MG chewable tablet Chew 1 tablet (81 mg total) by mouth daily.   atorvastatin 80 MG tablet Commonly known as:  LIPITOR Take 1 tablet (80 mg total) by mouth daily at 6 PM.   citalopram 20 MG tablet Commonly known as:  CELEXA Take 1 tablet (20 mg total) by mouth daily.   gabapentin 100 MG capsule Commonly known as:  NEURONTIN Take 100 mg by mouth 3 (three) times daily.   metFORMIN 1000 MG tablet Commonly known as:  GLUCOPHAGE Take 1 tablet by mouth 2 (two) times a day.   multivitamin with minerals Tabs tablet Take 1 tablet by mouth daily. *CENTRUM SILVER   oxyCODONE-acetaminophen 5-325 MG tablet Commonly known as:  PERCOCET/ROXICET Take 1 tablet by mouth 4 (four) times daily.   senna 8.6 MG Tabs tablet Commonly known as:  SENOKOT Take 1 tablet (8.6 mg total) by mouth daily as needed for mild constipation.   verapamil 180 MG CR tablet Commonly known as:  CALAN-SR Take 1 tablet by mouth daily.   Xarelto 20 MG Tabs tablet Generic drug:  rivaroxaban Take 20  mg by mouth daily with supper.       Discharged Condition: Improved    Consults: Neurology, Dr. Merlene Laughter  Significant Diagnostic Studies: Mr Brain Wo Contrast  Result Date: 07/24/2018 CLINICAL DATA:  Acute presentation with speech disturbance. EXAM: MRI HEAD WITHOUT CONTRAST MRA HEAD WITHOUT CONTRAST TECHNIQUE: Multiplanar, multiecho pulse sequences of the brain and surrounding structures were obtained without intravenous contrast. Angiographic images of the head were obtained using MRA technique without contrast. COMPARISON:  Head CT earlier same day FINDINGS: MRI HEAD FINDINGS Brain: Diffusion imaging shows a 1.5 cm to 2 cm acute infarction affecting the left posterior frontal cortical and subcortical brain. There is also a punctate acute infarction in the right hippocampus. Otherwise, no acute finding is seen. No evidence of mass, hydrocephalus or extra-axial collection. Old left frontal white matter infarction. Vascular: Major vessels at the base of the brain show flow. Skull and upper cervical spine: Negative Sinuses/Orbits: Clear Other: None MRA HEAD FINDINGS Both internal carotid arteries are widely patent into the brain. No siphon stenosis. The anterior and middle cerebral vessels are patent without proximal stenosis, aneurysm or vascular malformation. Both vertebral arteries are widely patent to the basilar. No basilar stenosis. Posterior circulation branch vessels appear normal. IMPRESSION: 1.5-2 cm region of acute infarction affecting the left posterior frontal cortical and subcortical brain consistent with MCA branch vessel infarction. Punctate acute infarction in the right hippocampus. Differing  vascular distributions suggest embolic disease from the heart or ascending aorta. Negative intracranial MR angiography of the large and medium size vessels. Electronically Signed   By: Nelson Chimes M.D.   On: 07/24/2018 11:10   US Carotid Bilateral  Result Date: 07/24/2018 CLINICAL DATA:   Left-sided facial droop aphasia, hypertension EXAM: BILATERAL CAROTID DUPLEX ULTRASOUND TECHNIQUE: Pearline Cables scale imaging, color Doppler and duplex ultrasound were performed of bilateral carotid and vertebral arteries in the neck. COMPARISON:  None. FINDINGS: Criteria: Quantification of carotid stenosis is based on velocity parameters that correlate the residual internal carotid diameter with NASCET-based stenosis levels, using the diameter of the distal internal carotid lumen as the denominator for stenosis measurement. The following velocity measurements were obtained: RIGHT ICA: 51/13 cm/sec CCA: 53/97 cm/sec SYSTOLIC ICA/CCA RATIO:  0.5 ECA: 57 cm/sec LEFT ICA: 81/20 cm/sec CCA: 673/41 cm/sec SYSTOLIC ICA/CCA RATIO:  0.8 ECA: 58 cm/sec RIGHT CAROTID ARTERY: Mild echogenic shadowing plaque formation. No hemodynamically significant right ICA stenosis, velocity elevation, or turbulent flow. Degree of narrowing less than 50%. RIGHT VERTEBRAL ARTERY:  Antegrade LEFT CAROTID ARTERY: Moderate scattered echogenic plaque formation. No hemodynamically significant left ICA stenosis, velocity elevation, or turbulent flow. LEFT VERTEBRAL ARTERY:  Antegrade IMPRESSION: Mild-to-moderate bilateral carotid atherosclerosis. No hemodynamically significant ICA stenosis. Degree of narrowing less than 50% bilaterally by ultrasound criteria. Patent antegrade vertebral flow Electronically Signed   By: Jerilynn Mages.  Shick M.D.   On: 07/24/2018 14:15   Mr Jodene Nam Head/brain PF Cm  Result Date: 07/24/2018 CLINICAL DATA:  Acute presentation with speech disturbance. EXAM: MRI HEAD WITHOUT CONTRAST MRA HEAD WITHOUT CONTRAST TECHNIQUE: Multiplanar, multiecho pulse sequences of the brain and surrounding structures were obtained without intravenous contrast. Angiographic images of the head were obtained using MRA technique without contrast. COMPARISON:  Head CT earlier same day FINDINGS: MRI HEAD FINDINGS Brain: Diffusion imaging shows a 1.5 cm to 2 cm acute  infarction affecting the left posterior frontal cortical and subcortical brain. There is also a punctate acute infarction in the right hippocampus. Otherwise, no acute finding is seen. No evidence of mass, hydrocephalus or extra-axial collection. Old left frontal white matter infarction. Vascular: Major vessels at the base of the brain show flow. Skull and upper cervical spine: Negative Sinuses/Orbits: Clear Other: None MRA HEAD FINDINGS Both internal carotid arteries are widely patent into the brain. No siphon stenosis. The anterior and middle cerebral vessels are patent without proximal stenosis, aneurysm or vascular malformation. Both vertebral arteries are widely patent to the basilar. No basilar stenosis. Posterior circulation branch vessels appear normal. IMPRESSION: 1.5-2 cm region of acute infarction affecting the left posterior frontal cortical and subcortical brain consistent with MCA branch vessel infarction. Punctate acute infarction in the right hippocampus. Differing vascular distributions suggest embolic disease from the heart or ascending aorta. Negative intracranial MR angiography of the large and medium size vessels. Electronically Signed   By: Nelson Chimes M.D.   On: 07/24/2018 11:10   Ct Head Code Stroke Wo Contrast  Result Date: 07/24/2018 CLINICAL DATA:  Code stroke.  Speech difficulty EXAM: CT HEAD WITHOUT CONTRAST TECHNIQUE: Contiguous axial images were obtained from the base of the skull through the vertex without intravenous contrast. COMPARISON:  04/09/2015 FINDINGS: Brain: Remote appearing lacunar infarcts in the left centrum semiovale. Left thalamic lacunar infarct that is likely chronic based on appearance and history. Small vessel ischemic gliosis in the white matter. New generalized cerebral volume loss. No evidence of acute gray matter infarct, hemorrhage, hydrocephalus, or mass. Vascular: No hyperdense  vessel. There is atherosclerotic calcification. Skull: Negative  Sinuses/Orbits: Negative Other: These results were called by telephone at the time of interpretation on 07/24/2018 at 8:49 am to Dr. Francine Graven , who verbally acknowledged these results. ASPECTS [facility] Stroke Program Early CT Score) - Ganglionic level infarction (caudate, lentiform nuclei, internal capsule, insula, M1-M3 cortex): 7 - Supraganglionic infarction (M4-M6 cortex): 3 Total score (0-10 with 10 being normal): 10 IMPRESSION: 1. Negative for hemorrhage or acute cortical infarct. ASPECTS is 10. 2. 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 These are new from 2017. 3. Cerebral volume loss since 2017. Electronically Signed   By: Monte Fantasia M.D.   On: 07/24/2018 08:53    Lab Results: Basic Metabolic Panel: Recent Labs    07/24/18 0832 07/25/18 0510  NA 143  143 145  K 3.9  4.0 3.8  CL 107  108 110  CO2 25 26  GLUCOSE 113*  110* 99  BUN 20  19 18   CREATININE 1.38*  1.40* 1.27*  CALCIUM 8.7* 8.4*   Liver Function Tests: Recent Labs    07/24/18 0832  AST 11*  ALT 11  ALKPHOS 50  BILITOT 0.4  PROT 7.3  ALBUMIN 3.4*     CBC: Recent Labs    07/24/18 0832 07/25/18 0510  WBC 8.2 7.6  NEUTROABS 4.5  --   HGB 11.8*  13.6 10.7*  HCT 40.4  40.0 37.3  MCV 88.0 88.6  PLT 267 255    Recent Results (from the past 240 hour(s))  SARS Coronavirus 2 (CEPHEID - Performed in [facility] hospital lab), Hosp Order     Status: None   Collection Time: 07/24/18  8:34 AM  Result Value Ref Range Status   SARS Coronavirus 2 NEGATIVE NEGATIVE Final    Comment: (NOTE) If result is NEGATIVE SARS-CoV-2 target nucleic acids are NOT DETECTED. The SARS-CoV-2 RNA is generally detectable in upper and lower  respiratory specimens during the acute phase of infection. The lowest  concentration of SARS-CoV-2 viral copies this assay can detect is 250  copies / mL. A negative result does not preclude SARS-CoV-2  infection  and should not be used as the sole basis for treatment or other  patient management decisions.  A negative result may occur with  improper specimen collection / handling, submission of specimen other  than nasopharyngeal swab, presence of viral mutation(s) within the  areas targeted by this assay, and inadequate number of viral copies  (<250 copies / mL). A negative result must be combined with clinical  observations, patient history, and epidemiological information. If result is POSITIVE SARS-CoV-2 target nucleic acids are DETECTED. The SARS-CoV-2 RNA is generally detectable in upper and lower  respiratory specimens dur ing the acute phase of infection.  Positive  results are indicative of active infection with SARS-CoV-2.  Clinical  correlation with patient history and other diagnostic information is  necessary to determine patient infection status.  Positive results do  not rule out bacterial infection or co-infection with other viruses. If result is PRESUMPTIVE POSTIVE SARS-CoV-2 nucleic acids MAY BE PRESENT.   A presumptive positive result was obtained on the submitted specimen  and confirmed on repeat testing.  While 2019 novel coronavirus  (SARS-CoV-2) nucleic acids may be present in the submitted sample  additional confirmatory testing may be necessary for epidemiological  and / or clinical management purposes  to differentiate between  SARS-CoV-2 and other Sarbecovirus currently known to infect  humans.  If clinically indicated additional testing with an alternate test  methodology [id number]) is advised. The SARS-CoV-2 RNA is generally  detectable in upper and lower respiratory sp ecimens during the acute  phase of infection. The expected result is Negative. Fact Sheet for Patients:  StrictlyIdeas.no Fact Sheet for Healthcare Providers: BankingDealers.co.za This test is not yet approved or cleared by the [place]  FDA and has been authorized for detection and/or diagnosis of SARS-CoV-2 by FDA under an Emergency Use Authorization (EUA).  This EUA will remain in effect (meaning this test can be used) for the duration of the COVID-19 declaration under Section 564(b)(1) of the Act, 21 U.S.C. section 360bbb-3(b)(1), unless the authorization is terminated or revoked sooner. Performed at [facility], [address] [place], [place] [address]   MRSA PCR Screening     Status: None   Collection Time: 07/24/18  8:52 PM  Result Value Ref Range Status   MRSA by PCR NEGATIVE NEGATIVE Final    Comment:        The GeneXpert MRSA Assay (FDA approved for NASAL specimens only), is one component of a comprehensive MRSA colonization surveillance program. It is not intended to diagnose MRSA infection nor to guide or monitor treatment for MRSA infections. Performed at [facility], [address], [place], [place] [address]      Hospital Course: This is a 68 year old who is been living at a skilled care facility and who came to the emergency department because of difficulty with speech.  She was found to have a stroke.  She did have bilateral acute infarct so there was concern that she had a cardioembolic phenomenon.  She has chronic trouble with her gait and a cervical spine MRI was recommended but she was unable to fit in our machine.  She wants to pursue this as an outpatient.  She had PT OT and speech evaluation.  She was felt to be mild aspiration risk and simply needed general aspiration precautions and follow-up with speech at her skilled care facility  Discharge Exam: Blood pressure 138/68, pulse 77, temperature 98.1 F (36.7 C), temperature source Oral, resp. rate 20, height 5' (1.524 m), weight 121.8 kg, SpO2 94 %. She is awake and alert.  Speech is much better.  She still has chronic weakness of her legs.  Disposition: Back to skilled care facility.  She will have PT OT and speech as needed.  She is  going to have a 30-day event monitor and that is being arranged by [facility] cardiology in [employer].  She needs to have an outpatient MRI of cervical spine that would need to be done in [place] because of her size      Signed: Alonza Bogus   07/26/2018, 8:44 AM

## 2018-07-26 NOTE — Progress Notes (Signed)
Report called to [facility] at this time

## 2018-07-26 NOTE — TOC Transition Note (Signed)
Transition of Care [facility]) - CM/SW Discharge Note   Patient Details  Name: [name] MRN: [id number] Date of Birth: [birth year]  Transition of Care [facility] CM/SW Contact:  Shade Flood, LCSW Phone Number: 07/26/2018, 9:37 AM   Clinical Narrative:     Pt with orders for dc back to [facility] today. Pt aware and in agreement. Updated Debbie at [facility] who requests pt return to them around 1pm. DC clinical sent electronically. EMS arranged for 1pm. EMS transport form printed to the RN station.  Updated pt's RN, Hinton Dyer. Phone number for report is [phone].  There are no other TOC needs for dc.  Final next level of care: Long Term Nursing Home Barriers to Discharge: Barriers Resolved   Patient Goals and CMS Choice        Discharge Placement                Patient to be transferred to facility by: EMS      Discharge Plan and Services                                     Social Determinants of Health (SDOH) Interventions     Readmission Risk Interventions No flowsheet data found.

## 2018-07-26 NOTE — Progress Notes (Signed)
Subjective: She says she feels better with no new complaints.  It had been recommended that she have a cervical spine MRI to evaluate her chronic gait abnormality but she did not fit in our scanner.  We discussed all that and she wants to pursue that as an outpatient since it is not related to her current stroke.  Objective: Vital signs in last 24 hours: Temp:  [97.7 F (36.5 C)-99 F (37.2 C)] 98.1 F (36.7 C) (06/05 0450) Pulse Rate:  [73-88] 77 (06/05 0450) Resp:  [16-20] 20 (06/05 0450) BP: (131-172)/(60-87) 138/68 (06/05 0450) SpO2:  [94 %-100 %] 94 % (06/05 0450) Weight change:  Last BM Date: 07/23/18  Intake/Output from previous day: 06/04 0701 - 06/05 0700 In: 360 [P.O.:360] Out: 1400 [Urine:1400]  PHYSICAL EXAM General appearance: alert, cooperative and morbidly obese Resp: clear to auscultation bilaterally Cardio: regular rate and rhythm, S1, S2 normal, no murmur, click, rub or gallop GI: soft, non-tender; bowel sounds normal; no masses,  no organomegaly Extremities: extremities normal, atraumatic, no cyanosis or edema  Lab Results:  Results for orders placed or performed during the hospital encounter of 07/24/18 (from the past 48 hour(s))  Reticulocytes     Status: None   Collection Time: 07/24/18  8:30 AM  Result Value Ref Range   Retic Ct Pct 0.8 0.4 - 3.1 %   RBC. 4.52 3.87 - 5.11 MIL/uL   Retic Count, Absolute 34.8 19.0 - 186.0 K/uL   Immature Retic Fract 13.9 2.3 - 15.9 %    Comment: Performed at [facility], [address], [place], [place] [address]  I-stat chem 8, ED     Status: Abnormal   Collection Time: 07/24/18  8:32 AM  Result Value Ref Range   Sodium 143 135 - 145 mmol/L   Potassium 4.0 3.5 - 5.1 mmol/L   Chloride 108 98 - 111 mmol/L   BUN 19 8 - 23 mg/dL   Creatinine, Ser 1.40 (H) 0.44 - 1.00 mg/dL   Glucose, Bld 110 (H) 70 - 99 mg/dL   Calcium, Ion 1.12 (L) 1.15 - 1.40 mmol/L   TCO2 27 22 - 32 mmol/L   Hemoglobin 13.6 12.0 - 15.0 g/dL    HCT 40.0 36.0 - 46.0 %  Ethanol     Status: None   Collection Time: 07/24/18  8:32 AM  Result Value Ref Range   Alcohol, Ethyl (B) <10 <10 mg/dL    Comment: (NOTE) Lowest detectable limit for serum alcohol is 10 mg/dL. For medical purposes only. Performed at [facility], [address], [place], [place] [address]   Protime-INR     Status: Abnormal   Collection Time: 07/24/18  8:32 AM  Result Value Ref Range   Prothrombin Time 15.4 (H) 11.4 - 15.2 seconds   INR 1.2 0.8 - 1.2    Comment: (NOTE) INR goal varies based on device and disease states. Performed at [facility], [address] [place], [place] [address]   APTT     Status: None   Collection Time: 07/24/18  8:32 AM  Result Value Ref Range   aPTT 28 24 - 36 seconds    Comment: Performed at [facility], [address] [place], [place] [address]  CBC     Status: Abnormal   Collection Time: 07/24/18  8:32 AM  Result Value Ref Range   WBC 8.2 4.0 - 10.5 K/uL   RBC 4.59 3.87 - 5.11 MIL/uL   Hemoglobin 11.8 (L) 12.0 - 15.0 g/dL  HCT 40.4 36.0 - 46.0 %   MCV 88.0 80.0 - 100.0 fL   MCH 25.7 (L) 26.0 - 34.0 pg   MCHC 29.2 (L) 30.0 - 36.0 g/dL   RDW 15.1 11.5 - 15.5 %   Platelets 267 150 - 400 K/uL   nRBC 0.0 0.0 - 0.2 %    Comment: Performed at [facility], [address], [place], [place] [address]  Differential     Status: None   Collection Time: 07/24/18  8:32 AM  Result Value Ref Range   Neutrophils Relative % 55 %   Neutro Abs 4.5 1.7 - 7.7 K/uL   Lymphocytes Relative 37 %   Lymphs Abs 3.0 0.7 - 4.0 K/uL   Monocytes Relative 6 %   Monocytes Absolute 0.5 0.1 - 1.0 K/uL   Eosinophils Relative 2 %   Eosinophils Absolute 0.2 0.0 - 0.5 K/uL   Basophils Relative 0 %   Basophils Absolute 0.0 0.0 - 0.1 K/uL   Immature Granulocytes 0 %   Abs Immature Granulocytes 0.03 0.00 - 0.07 K/uL    Comment: Performed at [facility], [address] [place], [place] [address]  Comprehensive metabolic panel     Status: Abnormal    Collection Time: 07/24/18  8:32 AM  Result Value Ref Range   Sodium 143 135 - 145 mmol/L   Potassium 3.9 3.5 - 5.1 mmol/L   Chloride 107 98 - 111 mmol/L   CO2 25 22 - 32 mmol/L   Glucose, Bld 113 (H) 70 - 99 mg/dL   BUN 20 8 - 23 mg/dL   Creatinine, Ser 1.38 (H) 0.44 - 1.00 mg/dL   Calcium 8.7 (L) 8.9 - 10.3 mg/dL   Total Protein 7.3 6.5 - 8.1 g/dL   Albumin 3.4 (L) 3.5 - 5.0 g/dL   AST 11 (L) 15 - 41 U/L   ALT 11 0 - 44 U/L   Alkaline Phosphatase 50 38 - 126 U/L   Total Bilirubin 0.4 0.3 - 1.2 mg/dL   GFR calc non Af Amer 39 (L) >60 mL/min   GFR calc Af Amer 45 (L) >60 mL/min   Anion gap 11 5 - 15    Comment: Performed at [facility], [address] [place],  [address]  SARS Coronavirus 2 (CEPHEID - Performed in [place] hospital lab), Hosp Order     Status: None   Collection Time: 07/24/18  8:34 AM  Result Value Ref Range   SARS Coronavirus 2 NEGATIVE NEGATIVE    Comment: (NOTE) If result is NEGATIVE SARS-CoV-2 target nucleic acids are NOT DETECTED. The SARS-CoV-2 RNA is generally detectable in upper and lower  respiratory specimens during the acute phase of infection. The lowest  concentration of SARS-CoV-2 viral copies this assay can detect is 250  copies / mL. A negative result does not preclude SARS-CoV-2 infection  and should not be used as the sole basis for treatment or other  patient management decisions.  A negative result may occur with  improper specimen collection / handling, submission of specimen other  than nasopharyngeal swab, presence of viral mutation(s) within the  areas targeted by this assay, and inadequate number of viral copies  (<250 copies / mL). A negative result must be combined with clinical  observations, patient history, and epidemiological information. If result is POSITIVE SARS-CoV-2 target nucleic acids are DETECTED. The SARS-CoV-2 RNA is generally detectable in upper and lower  respiratory specimens dur ing the acute phase of  infection.  Positive  results are indicative of active infection with SARS-CoV-2.  Clinical  correlation with patient history and other diagnostic information is  necessary to determine patient infection status.  Positive results do  not rule out bacterial infection or co-infection with other viruses. If result is PRESUMPTIVE POSTIVE SARS-CoV-2 nucleic acids MAY BE PRESENT.   A presumptive positive result was obtained on the submitted specimen  and confirmed on repeat testing.  While 2019 novel coronavirus  (SARS-CoV-2) nucleic acids may be present in the submitted sample  additional confirmatory testing may be necessary for epidemiological  and / or clinical management purposes  to differentiate between  SARS-CoV-2 and other Sarbecovirus currently known to infect humans.  If clinically indicated additional testing with an alternate test  methodology [phone]) is advised. The SARS-CoV-2 RNA is generally  detectable in upper and lower respiratory sp ecimens during the acute  phase of infection. The expected result is Negative. Fact Sheet for Patients:  StrictlyIdeas.no Fact Sheet for Healthcare Providers: BankingDealers.co.za This test is not yet approved or cleared by the [place] FDA and has been authorized for detection and/or diagnosis of SARS-CoV-2 by FDA under an Emergency Use Authorization (EUA).  This EUA will remain in effect (meaning this test can be used) for the duration of the COVID-19 declaration under Section 564(b)(1) of the Act, 21 U.S.C. section 360bbb-3(b)(1), unless the authorization is terminated or revoked sooner. Performed at [facility], [address] [place], [place] [address]   HIV antibody (Routine Testing)     Status: None   Collection Time: 07/24/18  9:48 AM  Result Value Ref Range   HIV Screen 4th Generation wRfx Non Reactive Non Reactive    Comment: (NOTE) Performed At: [facility] [place], [place] [id number] Rush Farmer MD NF:[id number]   Vitamin B12     Status: Abnormal   Collection Time: 07/24/18  1:33 PM  Result Value Ref Range   Vitamin B-12 1,078 (H) 180 - 914 pg/mL    Comment: (NOTE) This assay is not validated for testing neonatal or myeloproliferative syndrome specimens for Vitamin B12 levels. Performed at [facility], [address] [place], [place] [address]   Iron and TIBC     Status: Abnormal   Collection Time: 07/24/18  1:33 PM  Result Value Ref Range   Iron 35 28 - 170 ug/dL   TIBC 239 (L) 250 - 450 ug/dL   Saturation Ratios 15 10.4 - 31.8 %   UIBC 204 ug/dL    Comment: Performed at [facility], [address], [place], [place] [address]  Ferritin     Status: None   Collection Time: 07/24/18  1:33 PM  Result Value Ref Range   Ferritin 110 11 - 307 ng/mL    Comment: Performed at [facility], [address], [place], [place] [address]  Folate     Status: None   Collection Time: 07/24/18  1:34 PM  Result Value Ref Range   Folate 13.4 >5.9 ng/mL    Comment: Performed at [facility], [address] [place],  [address]  Glucose, capillary     Status: None   Collection Time: 07/24/18  4:27 PM  Result Value Ref Range   Glucose-Capillary 88 70 - 99 mg/dL   Comment 1 Notify RN    Comment 2 Document in Chart   MRSA PCR Screening     Status: None   Collection Time: 07/24/18  8:52 PM  Result Value Ref Range   MRSA by  PCR NEGATIVE NEGATIVE    Comment:        The GeneXpert MRSA Assay (FDA approved for NASAL specimens only), is one component of a comprehensive MRSA colonization surveillance program. It is not intended to diagnose MRSA infection nor to guide or monitor treatment for MRSA infections. Performed at [facility], [address] [place], [place] [address]   Glucose, capillary     Status: None   Collection Time: 07/24/18  9:13 PM  Result Value Ref Range   Glucose-Capillary 89 70 - 99 mg/dL  Urine rapid drug  screen (hosp performed)not at [facility]     Status: None   Collection Time: 07/24/18  9:44 PM  Result Value Ref Range   Opiates NONE DETECTED NONE DETECTED   Cocaine NONE DETECTED NONE DETECTED   Benzodiazepines NONE DETECTED NONE DETECTED   Amphetamines NONE DETECTED NONE DETECTED   Tetrahydrocannabinol NONE DETECTED NONE DETECTED   Barbiturates NONE DETECTED NONE DETECTED    Comment: (NOTE) DRUG SCREEN FOR MEDICAL PURPOSES ONLY.  IF CONFIRMATION IS NEEDED FOR ANY PURPOSE, NOTIFY LAB WITHIN 5 DAYS. LOWEST DETECTABLE LIMITS FOR URINE DRUG SCREEN Drug Class                     Cutoff (ng/mL) Amphetamine and metabolites    1000 Barbiturate and metabolites    200 Benzodiazepine                 532 Tricyclics and metabolites     300 Opiates and metabolites        300 Cocaine and metabolites        300 THC                            50 Performed at [facility], [address], [place], [place] [address]   Urinalysis, Routine w reflex microscopic     Status: Abnormal   Collection Time: 07/24/18  9:44 PM  Result Value Ref Range   Color, Urine YELLOW YELLOW   APPearance CLEAR CLEAR   Specific Gravity, Urine 1.014 1.005 - 1.030   pH 5.0 5.0 - 8.0   Glucose, UA NEGATIVE NEGATIVE mg/dL   Hgb urine dipstick NEGATIVE NEGATIVE   Bilirubin Urine NEGATIVE NEGATIVE   Ketones, ur 5 (A) NEGATIVE mg/dL   Protein, ur NEGATIVE NEGATIVE mg/dL   Nitrite NEGATIVE NEGATIVE   Leukocytes,Ua SMALL (A) NEGATIVE   RBC / HPF 0-5 0 - 5 RBC/hpf   WBC, UA 11-20 0 - 5 WBC/hpf   Bacteria, UA RARE (A) NONE SEEN   Squamous Epithelial / LPF 0-5 0 - 5   Mucus PRESENT     Comment: Performed at [facility], [address], [place], [place] [address]  Hemoglobin A1c     Status: Abnormal   Collection Time: 07/25/18  5:10 AM  Result Value Ref Range   Hgb A1c MFr Bld 6.4 (H) 4.8 - 5.6 %    Comment: (NOTE) Pre diabetes:          5.7%-6.4% Diabetes:              >6.4% Glycemic control for   <7.0% adults with  diabetes    Mean Plasma Glucose 136.98 mg/dL    Comment: Performed at [facility] [address] [place], [place] [address]  Lipid panel     Status: None   Collection Time: 07/25/18  5:10 AM  Result Value Ref Range  Cholesterol 111 0 - 200 mg/dL   Triglycerides 71 <150 mg/dL   HDL 44 >40 mg/dL   Total CHOL/HDL Ratio 2.5 RATIO   VLDL 14 0 - 40 mg/dL   LDL Cholesterol 53 0 - 99 mg/dL    Comment:        Total Cholesterol/HDL:CHD Risk Coronary Heart Disease Risk Table                     Men   Women  1/2 Average Risk   3.4   3.3  Average Risk       5.0   4.4  2 X Average Risk   9.6   7.1  3 X Average Risk  23.4   11.0        Use the calculated Patient Ratio above and the CHD Risk Table to determine the patient's CHD Risk.        ATP III CLASSIFICATION (LDL):  <100     mg/dL   Optimal  100-129  mg/dL   Near or Above                    Optimal  130-159  mg/dL   Borderline  160-189  mg/dL   High  >190     mg/dL   Very High Performed at [facility], [address] [place], [place] [address]   CBC     Status: Abnormal   Collection Time: 07/25/18  5:10 AM  Result Value Ref Range   WBC 7.6 4.0 - 10.5 K/uL   RBC 4.21 3.87 - 5.11 MIL/uL   Hemoglobin 10.7 (L) 12.0 - 15.0 g/dL   HCT 37.3 36.0 - 46.0 %   MCV 88.6 80.0 - 100.0 fL   MCH 25.4 (L) 26.0 - 34.0 pg   MCHC 28.7 (L) 30.0 - 36.0 g/dL   RDW 14.8 11.5 - 15.5 %   Platelets 255 150 - 400 K/uL   nRBC 0.0 0.0 - 0.2 %    Comment: Performed at [facility], [address] [place], [place] [address]  Basic metabolic panel     Status: Abnormal   Collection Time: 07/25/18  5:10 AM  Result Value Ref Range   Sodium 145 135 - 145 mmol/L   Potassium 3.8 3.5 - 5.1 mmol/L   Chloride 110 98 - 111 mmol/L   CO2 26 22 - 32 mmol/L   Glucose, Bld 99 70 - 99 mg/dL   BUN 18 8 - 23 mg/dL   Creatinine, Ser 1.27 (H) 0.44 - 1.00 mg/dL   Calcium 8.4 (L) 8.9 - 10.3 mg/dL   GFR calc non Af Amer 43 (L) >60 mL/min   GFR calc Af Amer 50  (L) >60 mL/min   Anion gap 9 5 - 15    Comment: Performed at [facility], [address] [place], [place] [address]  Glucose, capillary     Status: None   Collection Time: 07/25/18  7:28 AM  Result Value Ref Range   Glucose-Capillary 89 70 - 99 mg/dL  Glucose, capillary     Status: None   Collection Time: 07/25/18 11:45 AM  Result Value Ref Range   Glucose-Capillary 95 70 - 99 mg/dL  Glucose, capillary     Status: Abnormal   Collection Time: 07/25/18  4:52 PM  Result Value Ref Range   Glucose-Capillary 102 (H) 70 - 99 mg/dL  Glucose, capillary     Status: Abnormal   Collection Time: 07/25/18  9:06 PM  Result Value Ref Range   Glucose-Capillary 104 (H) 70 - 99 mg/dL  Glucose, capillary     Status: Abnormal   Collection Time: 07/26/18  7:50 AM  Result Value Ref Range   Glucose-Capillary 114 (H) 70 - 99 mg/dL    ABGS Recent Labs    07/24/18 0832  TCO2 27   CULTURES Recent Results (from the past 240 hour(s))  SARS Coronavirus 2 (CEPHEID - Performed in [place] hospital lab), Hosp Order     Status: None   Collection Time: 07/24/18  8:34 AM  Result Value Ref Range Status   SARS Coronavirus 2 NEGATIVE NEGATIVE Final    Comment: (NOTE) If result is NEGATIVE SARS-CoV-2 target nucleic acids are NOT DETECTED. The SARS-CoV-2 RNA is generally detectable in upper and lower  respiratory specimens during the acute phase of infection. The lowest  concentration of SARS-CoV-2 viral copies this assay can detect is 250  copies / mL. A negative result does not preclude SARS-CoV-2 infection  and should not be used as the sole basis for treatment or other  patient management decisions.  A negative result may occur with  improper specimen collection / handling, submission of specimen other  than nasopharyngeal swab, presence of viral mutation(s) within the  areas targeted by this assay, and inadequate number of viral copies  (<250 copies / mL). A negative result must be combined with  clinical  observations, patient history, and epidemiological information. If result is POSITIVE SARS-CoV-2 target nucleic acids are DETECTED. The SARS-CoV-2 RNA is generally detectable in upper and lower  respiratory specimens dur ing the acute phase of infection.  Positive  results are indicative of active infection with SARS-CoV-2.  Clinical  correlation with patient history and other diagnostic information is  necessary to determine patient infection status.  Positive results do  not rule out bacterial infection or co-infection with other viruses. If result is PRESUMPTIVE POSTIVE SARS-CoV-2 nucleic acids MAY BE PRESENT.   A presumptive positive result was obtained on the submitted specimen  and confirmed on repeat testing.  While 2019 novel coronavirus  (SARS-CoV-2) nucleic acids may be present in the submitted sample  additional confirmatory testing may be necessary for epidemiological  and / or clinical management purposes  to differentiate between  SARS-CoV-2 and other Sarbecovirus currently known to infect humans.  If clinically indicated additional testing with an alternate test  methodology [phone]) is advised. The SARS-CoV-2 RNA is generally  detectable in upper and lower respiratory sp ecimens during the acute  phase of infection. The expected result is Negative. Fact Sheet for Patients:  StrictlyIdeas.no Fact Sheet for Healthcare Providers: BankingDealers.co.za This test is not yet approved or cleared by the [place] FDA and has been authorized for detection and/or diagnosis of SARS-CoV-2 by FDA under an Emergency Use Authorization (EUA).  This EUA will remain in effect (meaning this test can be used) for the duration of the COVID-19 declaration under Section 564(b)(1) of the Act, 21 U.S.C. section 360bbb-3(b)(1), unless the authorization is terminated or revoked sooner. Performed at [facility], [address],  [place], [place] [address]   MRSA PCR Screening     Status: None   Collection Time: 07/24/18  8:52 PM  Result Value Ref Range Status   MRSA by PCR NEGATIVE NEGATIVE Final    Comment:        The GeneXpert MRSA Assay (FDA approved for NASAL specimens only), is one component of a comprehensive MRSA colonization surveillance program.  It is not intended to diagnose MRSA infection nor to guide or monitor treatment for MRSA infections. Performed at [facility], [address], [place], [place] [address]    Studies/Results: Mr Brain 67 Contrast  Result Date: 07/24/2018 CLINICAL DATA:  Acute presentation with speech disturbance. EXAM: MRI HEAD WITHOUT CONTRAST MRA HEAD WITHOUT CONTRAST TECHNIQUE: Multiplanar, multiecho pulse sequences of the brain and surrounding structures were obtained without intravenous contrast. Angiographic images of the head were obtained using MRA technique without contrast. COMPARISON:  Head CT earlier same day FINDINGS: MRI HEAD FINDINGS Brain: Diffusion imaging shows a 1.5 cm to 2 cm acute infarction affecting the left posterior frontal cortical and subcortical brain. There is also a punctate acute infarction in the right hippocampus. Otherwise, no acute finding is seen. No evidence of mass, hydrocephalus or extra-axial collection. Old left frontal white matter infarction. Vascular: Major vessels at the base of the brain show flow. Skull and upper cervical spine: Negative Sinuses/Orbits: Clear Other: None MRA HEAD FINDINGS Both internal carotid arteries are widely patent into the brain. No siphon stenosis. The anterior and middle cerebral vessels are patent without proximal stenosis, aneurysm or vascular malformation. Both vertebral arteries are widely patent to the basilar. No basilar stenosis. Posterior circulation branch vessels appear normal. IMPRESSION: 1.5-2 cm region of acute infarction affecting the left posterior frontal cortical and subcortical brain consistent with MCA  branch vessel infarction. Punctate acute infarction in the right hippocampus. Differing vascular distributions suggest embolic disease from the heart or ascending aorta. Negative intracranial MR angiography of the large and medium size vessels. Electronically Signed   By: Nelson Chimes M.D.   On: 07/24/2018 11:10   US Carotid Bilateral  Result Date: 07/24/2018 CLINICAL DATA:  Left-sided facial droop aphasia, hypertension EXAM: BILATERAL CAROTID DUPLEX ULTRASOUND TECHNIQUE: Pearline Cables scale imaging, color Doppler and duplex ultrasound were performed of bilateral carotid and vertebral arteries in the neck. COMPARISON:  None. FINDINGS: Criteria: Quantification of carotid stenosis is based on velocity parameters that correlate the residual internal carotid diameter with NASCET-based stenosis levels, using the diameter of the distal internal carotid lumen as the denominator for stenosis measurement. The following velocity measurements were obtained: RIGHT ICA: 51/13 cm/sec CCA: 48/54 cm/sec SYSTOLIC ICA/CCA RATIO:  0.5 ECA: 57 cm/sec LEFT ICA: 81/20 cm/sec CCA: 627/03 cm/sec SYSTOLIC ICA/CCA RATIO:  0.8 ECA: 58 cm/sec RIGHT CAROTID ARTERY: Mild echogenic shadowing plaque formation. No hemodynamically significant right ICA stenosis, velocity elevation, or turbulent flow. Degree of narrowing less than 50%. RIGHT VERTEBRAL ARTERY:  Antegrade LEFT CAROTID ARTERY: Moderate scattered echogenic plaque formation. No hemodynamically significant left ICA stenosis, velocity elevation, or turbulent flow. LEFT VERTEBRAL ARTERY:  Antegrade IMPRESSION: Mild-to-moderate bilateral carotid atherosclerosis. No hemodynamically significant ICA stenosis. Degree of narrowing less than 50% bilaterally by ultrasound criteria. Patent antegrade vertebral flow Electronically Signed   By: Jerilynn Mages.  Shick M.D.   On: 07/24/2018 14:15   Mr Jodene Nam Head/brain JK Cm  Result Date: 07/24/2018 CLINICAL DATA:  Acute presentation with speech disturbance. EXAM: MRI HEAD  WITHOUT CONTRAST MRA HEAD WITHOUT CONTRAST TECHNIQUE: Multiplanar, multiecho pulse sequences of the brain and surrounding structures were obtained without intravenous contrast. Angiographic images of the head were obtained using MRA technique without contrast. COMPARISON:  Head CT earlier same day FINDINGS: MRI HEAD FINDINGS Brain: Diffusion imaging shows a 1.5 cm to 2 cm acute infarction affecting the left posterior frontal cortical and subcortical brain. There is also a punctate acute infarction in the right hippocampus. Otherwise, no acute finding is  seen. No evidence of mass, hydrocephalus or extra-axial collection. Old left frontal white matter infarction. Vascular: Major vessels at the base of the brain show flow. Skull and upper cervical spine: Negative Sinuses/Orbits: Clear Other: None MRA HEAD FINDINGS Both internal carotid arteries are widely patent into the brain. No siphon stenosis. The anterior and middle cerebral vessels are patent without proximal stenosis, aneurysm or vascular malformation. Both vertebral arteries are widely patent to the basilar. No basilar stenosis. Posterior circulation branch vessels appear normal. IMPRESSION: 1.5-2 cm region of acute infarction affecting the left posterior frontal cortical and subcortical brain consistent with MCA branch vessel infarction. Punctate acute infarction in the right hippocampus. Differing vascular distributions suggest embolic disease from the heart or ascending aorta. Negative intracranial MR angiography of the large and medium size vessels. Electronically Signed   By: Nelson Chimes M.D.   On: 07/24/2018 11:10   Ct Head Code Stroke Wo Contrast  Result Date: 07/24/2018 CLINICAL DATA:  Code stroke.  Speech difficulty EXAM: CT HEAD WITHOUT CONTRAST TECHNIQUE: Contiguous axial images were obtained from the base of the skull through the vertex without intravenous contrast. COMPARISON:  04/09/2015 FINDINGS: Brain: Remote appearing lacunar infarcts in  the left centrum semiovale. Left thalamic lacunar infarct that is likely chronic based on appearance and history. Small vessel ischemic gliosis in the white matter. New generalized cerebral volume loss. No evidence of acute gray matter infarct, hemorrhage, hydrocephalus, or mass. Vascular: No hyperdense vessel. There is atherosclerotic calcification. Skull: Negative Sinuses/Orbits: Negative Other: These results were called by telephone at the time of interpretation on 07/24/2018 at 8:49 am to Dr. Francine Graven , who verbally acknowledged these results. ASPECTS [facility] Stroke Program Early CT Score) - Ganglionic level infarction (caudate, lentiform nuclei, internal capsule, insula, M1-M3 cortex): 7 - Supraganglionic infarction (M4-M6 cortex): 3 Total score (0-10 with 10 being normal): 10 IMPRESSION: 1. Negative for hemorrhage or acute cortical infarct. ASPECTS is 10. 2. 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 These are new from 2017. 3. Cerebral volume loss since 2017. Electronically Signed   By: Monte Fantasia M.D.   On: 07/24/2018 08:53    Medications:  Prior to Admission:  Medications Prior to Admission  Medication Sig Dispense Refill Last Dose  . ammonium lactate (LAC-HYDRIN) 12 % lotion Apply topically 2 (two) times daily. 400 g 0   . aspirin 81 MG chewable tablet Chew 1 tablet (81 mg total) by mouth daily.   07/23/2018 at 900  . citalopram (CELEXA) 20 MG tablet Take 1 tablet (20 mg total) by mouth daily.   07/23/2018 at Unknown time  . gabapentin (NEURONTIN) 100 MG capsule Take 100 mg by mouth 3 (three) times daily.   07/23/2018 at Unknown time  . metFORMIN (GLUCOPHAGE) 1000 MG tablet Take 1 tablet by mouth 2 (two) times a day.   07/23/2018 at Unknown time  . Multiple Vitamin (MULTIVITAMIN WITH MINERALS) TABS tablet Take 1 tablet by mouth daily. *CENTRUM SILVER   07/23/2018 at Unknown time  . oxyCODONE-acetaminophen  (PERCOCET/ROXICET) 5-325 MG per tablet Take 1 tablet by mouth 4 (four) times daily.    07/23/2018 at Unknown time  . senna (SENOKOT) 8.6 MG TABS tablet Take 1 tablet (8.6 mg total) by mouth daily as needed for mild constipation. 120 each 0 07/23/2018 at Unknown time  . verapamil (CALAN-SR) 180 MG CR tablet Take 1 tablet by mouth daily.   07/23/2018 at Unknown time  . XARELTO 20  MG TABS Take 20 mg by mouth daily with supper.    07/23/2018 at 2100  . lisinopril (PRINIVIL,ZESTRIL) 20 MG tablet Take 1 tablet (20 mg total) by mouth daily. (Patient not taking: Reported on 07/24/2018)   Not Taking at Unknown time  . sulfamethoxazole-trimethoprim (BACTRIM DS,SEPTRA DS) 800-160 MG tablet Take 1 tablet by mouth 2 (two) times daily. (Patient not taking: Reported on 07/24/2018) 30 tablet  Not Taking at Unknown time   Scheduled: .  stroke: mapping our early stages of recovery book   Does not apply Once  . apixaban  5 mg Oral BID  . aspirin  81 mg Oral Daily  . atorvastatin  20 mg Oral q1800  . celecoxib  200 mg Oral Daily  . citalopram  20 mg Oral Daily  . insulin aspart  0-5 Units Subcutaneous QHS  . insulin aspart  0-9 Units Subcutaneous TID WC  . multivitamin with minerals  1 tablet Oral Daily  . verapamil  240 mg Oral Daily   Continuous:  [id number] **OR** acetaminophen (TYLENOL) oral liquid 160 mg/5 mL **OR** acetaminophen, hydrALAZINE, oxyCODONE-acetaminophen, senna  Assesment: She was admitted with a stroke.  She is better.  He had mostly speech abnormality and she is felt to be mild aspiration risk  She has hypertension which is stable  She has chronic gait abnormality and needs to have MRI of the cervical spine but she wants to pursue that as an outpatient  She is chronically anticoagulated because of history of pulmonary embolus  She has morbid obesity which is unchanged  She has diabetes and she is on sliding scale and she is stable Principal Problem:   CVA (cerebral vascular accident)  [facility]) Active Problems:   Type 2 diabetes mellitus ([place])   Essential hypertension, benign   History of pulmonary embolus (PE)   Morbid obesity ([place])    Plan: Discharge to skilled care facility today.    LOS: 2 days   [name] 07/26/2018, 8:26 AM

## 2018-07-26 NOTE — Care Management Important Message (Signed)
Important Message  Patient Details  Name: [name] MRN: [id number] Date of Birth: [birth year]   Medicare Important Message Given:  Yes    Tommy Medal 07/26/2018, 1:20 PM

## 2018-07-26 NOTE — Plan of Care (Signed)
  Problem: Education: Goal: Knowledge of disease or condition will improve Outcome: Progressing Goal: Knowledge of secondary prevention will improve Outcome: Progressing Goal: Knowledge of patient specific risk factors addressed and post discharge goals established will improve Outcome: Progressing   Problem: Education: Goal: Knowledge of disease or condition will improve Outcome: Progressing Goal: Knowledge of secondary prevention will improve Outcome: Progressing Goal: Knowledge of patient specific risk factors addressed and post discharge goals established will improve Outcome: Progressing

## 2018-07-26 NOTE — NC FL2 (Signed)
[place] MEDICAID FL2 LEVEL OF CARE SCREENING TOOL     IDENTIFICATION  Patient Name: [name] Birthdate: December 15, 1950 Sex: female Admission Date (Current Location): 07/24/2018  [facility] and [place] Number:  [employer] and Address:  [place] [address], [place]      Provider Number: [id number]  Attending Physician Name and Address:  Sinda Du, MD  Relative Name and Phone Number:       Current Level of Care: Hospital Recommended Level of Care: [place] Prior Approval Number:    Date Approved/Denied:   PASRR Number: [id number]  Discharge Plan: SNF    Current Diagnoses: Patient Active Problem List   Diagnosis Date Noted  . CVA (cerebral vascular accident) ([place]) 07/24/2018  . Depression 04/12/2015  . TIA (transient ischemic attack) 04/09/2015  . Aphasia 04/09/2015  . Cellulitis and abscess of leg 04/09/2015  . 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  . Fever, unspecified 12/25/2014  . Sepsis ([place]) 12/25/2014  . Cellulitis 12/25/2014  . Essential hypertension, benign 12/20/2011  . History of pulmonary embolus (PE) 12/20/2011  . Morbid obesity ([place]) 12/20/2011  . Preoperative cardiovascular examination 12/20/2011  . Type 2 diabetes mellitus ([place]) 09/11/2006    Orientation RESPIRATION BLADDER Height & Weight     Self, Time, Situation, Place  Normal Incontinent Weight: 268 lb 8 oz (121.8 kg) Height:  5' (152.4 cm)  BEHAVIORAL SYMPTOMS/MOOD NEUROLOGICAL BOWEL NUTRITION STATUS    (Weakness) Continent (Regular;Thin liquid )  AMBULATORY STATUS COMMUNICATION OF NEEDS Skin   Extensive Assist Verbally Normal                       Personal Care Assistance Level of Assistance  Bathing, Feeding, Dressing Bathing Assistance: Maximum assistance Feeding assistance: Independent Dressing Assistance: Maximum assistance     Functional Limitations Info    Sight Info: Adequate Hearing Info: Adequate Speech Info:  Adequate    SPECIAL CARE FACTORS FREQUENCY  PT (By licensed PT), OT (By licensed OT), Speech therapy     PT Frequency: 3 times week OT Frequency: 2 times week     Speech Therapy Frequency: PRN      Contractures Contractures Info: Not present    Additional Factors Info  Psychotropic Code Status Info: DNR Allergies Info: vancomycin Psychotropic Info: Celexa, Neurontin         Current Medications (07/26/2018):  This is the current hospital active medication list Current Facility-Administered Medications  Medication Dose Route Frequency Provider Last Rate Last Dose  .  stroke: mapping our early stages of recovery book   Does not apply Once Manuella Ghazi, Pratik D, DO      . acetaminophen (TYLENOL) tablet 650 mg  650 mg Oral Q4H PRN Manuella Ghazi, Pratik D, DO       Or  . acetaminophen (TYLENOL) solution 650 mg  650 mg Per Tube Q4H PRN Manuella Ghazi, Pratik D, DO       Or  . acetaminophen (TYLENOL) suppository 650 mg  650 mg Rectal Q4H PRN Manuella Ghazi, Pratik D, DO      . apixaban (ELIQUIS) tablet 5 mg  5 mg Oral BID Phillips Odor, MD   5 mg at 07/26/18 0854  . aspirin chewable tablet 81 mg  81 mg Oral Daily Heath Lark D, DO   81 mg at 07/26/18 0855  . atorvastatin (LIPITOR) tablet 20 mg  20 mg Oral q1800 Manuella Ghazi, Pratik D, DO   20 mg at 07/25/18 1810  . celecoxib (  CELEBREX) capsule 200 mg  200 mg Oral Daily Manuella Ghazi, Pratik D, DO   200 mg at 07/26/18 0854  . citalopram (CELEXA) tablet 20 mg  20 mg Oral Daily Manuella Ghazi, Pratik D, DO   20 mg at 07/26/18 0855  . hydrALAZINE (APRESOLINE) injection 10 mg  10 mg Intravenous Q4H PRN Manuella Ghazi, Pratik D, DO      . insulin aspart (novoLOG) injection 0-5 Units  0-5 Units Subcutaneous QHS Manuella Ghazi, Pratik D, DO      . insulin aspart (novoLOG) injection 0-9 Units  0-9 Units Subcutaneous TID WC Shah, Pratik D, DO      . multivitamin with minerals tablet 1 tablet  1 tablet Oral Daily Manuella Ghazi, Pratik D, DO   1 tablet at 07/26/18 0854  . oxyCODONE-acetaminophen (PERCOCET/ROXICET) 5-325 MG per tablet  1 tablet  1 tablet Oral Q6H PRN Manuella Ghazi, Pratik D, DO      . senna (SENOKOT) tablet 8.6 mg  1 tablet Oral Daily PRN Manuella Ghazi, Pratik D, DO      . verapamil (CALAN-SR) CR tablet 240 mg  240 mg Oral Daily Manuella Ghazi, Pratik D, DO   240 mg at 07/26/18 5643     Discharge Medications: Please see discharge summary for a list of discharge medications.  Relevant Imaging Results:  Relevant Lab Results:   Additional Information [id number]  Shade Flood, LCSW

## 2018-07-30 ENCOUNTER — Telehealth: Payer: Self-pay | Admitting: *Deleted

## 2018-07-30 DIAGNOSIS — G459 Transient cerebral ischemic attack, unspecified: Secondary | ICD-10-CM

## 2018-07-30 NOTE — Telephone Encounter (Signed)
Order placed

## 2018-07-30 NOTE — Telephone Encounter (Signed)
-----   Message from Erma Heritage, [place] sent at 07/26/2018  8:32 AM EDT ----- Regarding: 30-day monitor Hi Kisha,   This patient needs a 30-day cardiac event monitor for CVA per Dr. Luan Pulling.   Thanks,  [place]

## 2018-08-13 ENCOUNTER — Telehealth: Payer: Self-pay

## 2018-08-13 NOTE — Telephone Encounter (Signed)
Received letter from [employer]. Pt would like to cancel monitoring services.

## 2018-08-29 ENCOUNTER — Other Ambulatory Visit (HOSPITAL_COMMUNITY): Payer: Self-pay | Admitting: Internal Medicine

## 2018-08-29 ENCOUNTER — Other Ambulatory Visit: Payer: Self-pay | Admitting: Internal Medicine

## 2018-08-29 DIAGNOSIS — M542 Cervicalgia: Secondary | ICD-10-CM

## 2018-09-06 ENCOUNTER — Other Ambulatory Visit: Payer: Self-pay

## 2018-09-06 ENCOUNTER — Encounter (HOSPITAL_COMMUNITY): Payer: Self-pay

## 2018-09-06 ENCOUNTER — Ambulatory Visit (HOSPITAL_COMMUNITY)
Admission: RE | Admit: 2018-09-06 | Discharge: 2018-09-06 | Disposition: A | Discharge status: Home / Self Care | Payer: Medicare Other | Source: Ambulatory Visit | Attending: Internal Medicine | Admitting: Internal Medicine

## 2018-09-06 ENCOUNTER — Ambulatory Visit (HOSPITAL_COMMUNITY): Admission: RE | Admit: 2018-09-06 | Payer: Medicare Other | Source: Ambulatory Visit

## 2018-09-06 DIAGNOSIS — M542 Cervicalgia: Secondary | ICD-10-CM | POA: Insufficient documentation

## 2020-07-20 ENCOUNTER — Emergency Department (HOSPITAL_COMMUNITY): Payer: Medicare (Managed Care)

## 2020-07-20 ENCOUNTER — Emergency Department (HOSPITAL_COMMUNITY)
Admission: EM | Admit: 2020-07-20 | Discharge: 2020-07-20 | Disposition: A | Discharge status: Home / Self Care | Payer: Medicare (Managed Care) | Attending: Emergency Medicine | Admitting: Emergency Medicine

## 2020-07-20 DIAGNOSIS — R29810 Facial weakness: Secondary | ICD-10-CM | POA: Diagnosis present

## 2020-07-20 DIAGNOSIS — I10 Essential (primary) hypertension: Secondary | ICD-10-CM | POA: Diagnosis not present

## 2020-07-20 DIAGNOSIS — R2990 Unspecified symptoms and signs involving the nervous system: Secondary | ICD-10-CM

## 2020-07-20 DIAGNOSIS — Z79899 Other long term (current) drug therapy: Secondary | ICD-10-CM | POA: Insufficient documentation

## 2020-07-20 DIAGNOSIS — Z7982 Long term (current) use of aspirin: Secondary | ICD-10-CM | POA: Diagnosis not present

## 2020-07-20 DIAGNOSIS — E119 Type 2 diabetes mellitus without complications: Secondary | ICD-10-CM | POA: Diagnosis not present

## 2020-07-20 DIAGNOSIS — Z20822 Contact with and (suspected) exposure to covid-19: Secondary | ICD-10-CM | POA: Insufficient documentation

## 2020-07-20 DIAGNOSIS — N3000 Acute cystitis without hematuria: Secondary | ICD-10-CM | POA: Diagnosis not present

## 2020-07-20 DIAGNOSIS — Z7984 Long term (current) use of oral hypoglycemic drugs: Secondary | ICD-10-CM | POA: Insufficient documentation

## 2020-07-20 DIAGNOSIS — Z7901 Long term (current) use of anticoagulants: Secondary | ICD-10-CM | POA: Diagnosis not present

## 2020-07-20 LAB — CBC
HCT: 39.4 % (ref 36.0–46.0)
Hemoglobin: 11.5 g/dL — ABNORMAL LOW (ref 12.0–15.0)
MCH: 26.6 pg (ref 26.0–34.0)
MCHC: 29.2 g/dL — ABNORMAL LOW (ref 30.0–36.0)
MCV: 91.2 fL (ref 80.0–100.0)
Platelets: 247 10*3/uL (ref 150–400)
RBC: 4.32 MIL/uL (ref 3.87–5.11)
RDW: 15.9 % — ABNORMAL HIGH (ref 11.5–15.5)
WBC: 8.5 10*3/uL (ref 4.0–10.5)
nRBC: 0 % (ref 0.0–0.2)

## 2020-07-20 LAB — I-STAT CHEM 8, ED
BUN: 27 mg/dL — ABNORMAL HIGH (ref 8–23)
Calcium, Ion: 1.1 mmol/L — ABNORMAL LOW (ref 1.15–1.40)
Chloride: 112 mmol/L — ABNORMAL HIGH (ref 98–111)
Creatinine, Ser: 1.3 mg/dL — ABNORMAL HIGH (ref 0.44–1.00)
Glucose, Bld: 81 mg/dL (ref 70–99)
HCT: 37 % (ref 36.0–46.0)
Hemoglobin: 12.6 g/dL (ref 12.0–15.0)
Potassium: 4.9 mmol/L (ref 3.5–5.1)
Sodium: 143 mmol/L (ref 135–145)
TCO2: 22 mmol/L (ref 22–32)

## 2020-07-20 LAB — URINALYSIS, ROUTINE W REFLEX MICROSCOPIC
Bilirubin Urine: NEGATIVE
Glucose, UA: NEGATIVE mg/dL
Hgb urine dipstick: NEGATIVE
Ketones, ur: NEGATIVE mg/dL
Nitrite: POSITIVE — AB
Protein, ur: 100 mg/dL — AB
Specific Gravity, Urine: 1.016 (ref 1.005–1.030)
pH: 5 (ref 5.0–8.0)

## 2020-07-20 LAB — COMPREHENSIVE METABOLIC PANEL
ALT: 13 U/L (ref 0–44)
AST: 16 U/L (ref 15–41)
Albumin: 3.1 g/dL — ABNORMAL LOW (ref 3.5–5.0)
Alkaline Phosphatase: 51 U/L (ref 38–126)
Anion gap: 10 (ref 5–15)
BUN: 29 mg/dL — ABNORMAL HIGH (ref 8–23)
CO2: 22 mmol/L (ref 22–32)
Calcium: 8.5 mg/dL — ABNORMAL LOW (ref 8.9–10.3)
Chloride: 109 mmol/L (ref 98–111)
Creatinine, Ser: 1.27 mg/dL — ABNORMAL HIGH (ref 0.44–1.00)
GFR, Estimated: 45 mL/min — ABNORMAL LOW (ref >60)
Glucose, Bld: 89 mg/dL (ref 70–99)
Potassium: 4.8 mmol/L (ref 3.5–5.1)
Sodium: 141 mmol/L (ref 135–145)
Total Bilirubin: 0.4 mg/dL (ref 0.3–1.2)
Total Protein: 6.9 g/dL (ref 6.5–8.1)

## 2020-07-20 LAB — DIFFERENTIAL
Abs Immature Granulocytes: 0.03 10*3/uL (ref 0.00–0.07)
Basophils Absolute: 0 10*3/uL (ref 0.0–0.1)
Basophils Relative: 0 %
Eosinophils Absolute: 0.1 10*3/uL (ref 0.0–0.5)
Eosinophils Relative: 2 %
Immature Granulocytes: 0 %
Lymphocytes Relative: 28 %
Lymphs Abs: 2.4 10*3/uL (ref 0.7–4.0)
Monocytes Absolute: 0.4 10*3/uL (ref 0.1–1.0)
Monocytes Relative: 5 %
Neutro Abs: 5.5 10*3/uL (ref 1.7–7.7)
Neutrophils Relative %: 65 %

## 2020-07-20 LAB — RESP PANEL BY RT-PCR (FLU A&B, COVID) ARPGX2
Influenza A by PCR: NEGATIVE
Influenza B by PCR: NEGATIVE
SARS Coronavirus 2 by RT PCR: NEGATIVE

## 2020-07-20 LAB — RAPID URINE DRUG SCREEN, HOSP PERFORMED
Amphetamines: NOT DETECTED
Barbiturates: NOT DETECTED
Benzodiazepines: NOT DETECTED
Cocaine: NOT DETECTED
Opiates: NOT DETECTED
Tetrahydrocannabinol: NOT DETECTED

## 2020-07-20 LAB — APTT: aPTT: 30 seconds (ref 24–36)

## 2020-07-20 LAB — CBG MONITORING, ED: Glucose-Capillary: 81 mg/dL (ref 70–99)

## 2020-07-20 LAB — PROTIME-INR
INR: 1.4 — ABNORMAL HIGH (ref 0.8–1.2)
Prothrombin Time: 17 seconds — ABNORMAL HIGH (ref 11.4–15.2)

## 2020-07-20 LAB — ETHANOL: Alcohol, Ethyl (B): <10 mg/dL (ref <10)

## 2020-07-20 MED ORDER — CEPHALEXIN 500 MG PO CAPS: 500.0000 mg | ORAL_CAPSULE | Freq: Four times a day (QID) | ORAL | 0 refills | Status: DC

## 2020-07-20 MED ORDER — SODIUM CHLORIDE 0.9 % IV SOLN
1.0000 g | Freq: Once | INTRAVENOUS | Status: AC
  Administered 2020-07-20: 1 g via INTRAVENOUS
  Filled 2020-07-20: qty 10

## 2020-07-20 NOTE — Consult Note (Signed)
[facility] Telemedicine Consult  Requesting Provider: Dr. Rogene Houston Consult Participants: Dr. Jerelyn Charles, Telespecialist RN Estill Bamberg, bedside RN Location of the provider: [facility] Location of the patient: [facility]-emergency room bed 2 This consult was provided via telemedicine with 2-way video and audio communication. The patient/family was informed that care would be provided in this way and agreed to receive care in this manner.   Chief Complaint: Right-sided weakness, facial droop, aphasia.  HPI: 70 year old woman with past medical history of pulmonary embolus on anticoagulation with Xarelto, hypertension, diabetes, resident of a nursing home and wheelchair-bound at baseline, presenting to the emergency room from [facility] with complaints of right-sided weakness and word finding difficulty as noted by EMS.  On the emergency room initial evaluation, also concern for some mild right-sided facial asymmetry-code stroke activated. Patient does not know why she is being brought to the hospital and is unable to provide reliable history.  No reported seizures.  No documented history of seizures. EMS was called at that facility with patient's last known normal at 0 900.  EMS reported that the patient was having a difficult time getting her words out but her symptoms started to improve on the way to the hospital.  There was also concern for a right-sided facial droop and right upper extremity weakness.  She had bilateral lower extremity weakness per EMS.  These findings were confirmed by the ED providers as well.  Chart review reveals that patient had a bilateral hemispheric stroke with some right hemiparesis in June [id number] cardioembolic.  Unable to find any other neurology notes either in our chart or Care Everywhere.  Past Medical History:  Diagnosis Date  . Cellulitis lower extremities   4 day hospitalization 12/25/14-12/29/14  . Essential hypertension, benign   . History of MRSA  infection   . History of panniculitis   . Pulmonary emboli ([place]) 2009  . Skin ulcer of thigh ([place])   . Type 2 diabetes mellitus ([facility])     No current facility-administered medications for this encounter.  Current Outpatient Medications:  .  ammonium lactate (LAC-HYDRIN) 12 % lotion, Apply topically 2 (two) times daily., Disp: 400 g, Rfl: 0 .  aspirin 81 MG chewable tablet, Chew 1 tablet (81 mg total) by mouth daily., Disp: , Rfl:  .  atorvastatin (LIPITOR) 80 MG tablet, Take 1 tablet (80 mg total) by mouth daily at 6 PM., Disp: , Rfl:  .  citalopram (CELEXA) 20 MG tablet, Take 1 tablet (20 mg total) by mouth daily., Disp: , Rfl:  .  gabapentin (NEURONTIN) 100 MG capsule, Take 100 mg by mouth 3 (three) times daily., Disp: , Rfl:  .  metFORMIN (GLUCOPHAGE) 1000 MG tablet, Take 1 tablet by mouth 2 (two) times a day., Disp: , Rfl:  .  Multiple Vitamin (MULTIVITAMIN WITH MINERALS) TABS tablet, Take 1 tablet by mouth daily. *CENTRUM SILVER, Disp: , Rfl:  .  oxyCODONE-acetaminophen (PERCOCET/ROXICET) 5-325 MG per tablet, Take 1 tablet by mouth 4 (four) times daily. , Disp: , Rfl:  .  senna (SENOKOT) 8.6 MG TABS tablet, Take 1 tablet (8.6 mg total) by mouth daily as needed for mild constipation., Disp: 120 each, Rfl: 0 .  verapamil (CALAN-SR) 180 MG CR tablet, Take 1 tablet by mouth daily., Disp: , Rfl:  .  XARELTO 20 MG TABS, Take 20 mg by mouth daily with supper. , Disp: , Rfl:     LKW: 0900 hrs. tpa given?: No, on Xarelto IR Thrombectomy? No, poor baseline MR S,  exam not suggestive of an LVO Modified Rankin Scale: 4-Needs assistance to walk and tend to bodily needs Time of teleneurologist evaluation: 1220  Exam: Vitals:   07/20/20 1224 07/20/20 1300  BP: (!) 154/88 (!) 154/82  Pulse: 77 84  Resp: 16 15  Temp: 98.7 F (37.1 C)   SpO2: 99% 100%    General: Awake alert in no apparent distress HEENT: Normocephalic atraumatic CVS: Regular rate rhythm on the monitor Respiratory:  Breathing well saturating normally on room air Extremities: Warm well perfused with significant nonpitting edema in both lower extremities. Neurological exam She is awake alert oriented to self, was able to tell me the correct month and age.  Did not tell me the correct year. Her speech is not dysarthric.  She is mildly hypophonic.  I did seem like she was having trouble explaining the pictures on the NIH stroke cards-question mild expressive aphasia.  She did not have any trouble following commands.  No problems repetition. Cranial nerves: Pupils equal round react light, extraocular movements appeared unhindered, difficult to assess visual fields but it did look like she might be extinguishing right visual fields on double simultaneous stimulation, face appears grossly symmetric on my look through the camera-both at rest and on smiling, tongue and palate midline.  She is able to lift her left arm without any drift.  Right upper extremity has some limitation due to shoulder discomfort but is full strength distally on the elbow and right hand grip.  Both lower extremities are barely 1-2/5.  Sensation is intact without extinction.  No gross dysmetria.   NIHSS 1A: Level of Consciousness - 0 1B: Ask Month and Age - 0 1C: 'Blink Eyes' & 'Squeeze Hands' - 0 2: Test Horizontal Extraocular Movements - 0 3: Test Visual Fields - 1 4: Test Facial Palsy - 0 5A: Test Left Arm Motor Drift - 0 5B: Test Right Arm Motor Drift - 1 6A: Test Left Leg Motor Drift - 3 6B: Test Right Leg Motor Drift - 3 7: Test Limb Ataxia - 0 8: Test Sensation - 0 9: Test Language/Aphasia- 1 10: Test Dysarthria - 0 11: Test Extinction/Inattention - 0 NIHSS score: 9   Imaging Reviewed: CT head with no acute changes.  Aspects 10.  Chronic left thalamic infarct when compared to the CT head from 2020.  Chronic left frontal infarct-appears stable when compared with MRI from 2020.  Labs reviewed in epic and pertinent values  follow: CBC    Component Value Date/Time   WBC 8.5 07/20/2020 1220   RBC 4.32 07/20/2020 1220   HGB 12.6 07/20/2020 1228   HCT 37.0 07/20/2020 1228   PLT 247 07/20/2020 1220   MCV 91.2 07/20/2020 1220   MCH 26.6 07/20/2020 1220   MCHC 29.2 (L) 07/20/2020 1220   RDW 15.9 (H) 07/20/2020 1220   LYMPHSABS 2.4 07/20/2020 1220   MONOABS 0.4 07/20/2020 1220   EOSABS 0.1 07/20/2020 1220   BASOSABS 0.0 07/20/2020 1220   CMP     Component Value Date/Time   NA 143 07/20/2020 1228   K 4.9 07/20/2020 1228   CL 112 (H) 07/20/2020 1228   CO2 22 07/20/2020 1220   GLUCOSE 81 07/20/2020 1228   BUN 27 (H) 07/20/2020 1228   CREATININE 1.30 (H) 07/20/2020 1228   CALCIUM 8.5 (L) 07/20/2020 1220   PROT 6.9 07/20/2020 1220   ALBUMIN 3.1 (L) 07/20/2020 1220   AST 16 07/20/2020 1220   ALT 13 07/20/2020 1220   ALKPHOS 51  07/20/2020 1220   BILITOT 0.4 07/20/2020 1220   GFRNONAA 45 (L) 07/20/2020 1220   GFRAA 50 (L) 07/25/2018 0510     Assessment: 70 year old woman past history of pulmonary embolus on anticoagulation with Xarelto, hypertension, diabetes presenting with complaints of some right-sided weakness, word finding difficulty and concern for right facial weakness most of the symptoms have resolved. Does have some pain and restricted range of motion in the right shoulder causing right arm drift, bilateral leg drift and question some expressive aphasia as well as extinguishing right visual field on double simultaneous stimulation. Not a candidate for tPA due to Xarelto Not a candidate for EVT due to poor baseline Symptoms could be consistent with a cortical left hemispheric stroke, but the patient being on Xarelto precludes tPA and poor baseline precludes EVT.  Impression: Strokelike symptoms-evaluate for stroke ?Toxic metabolic encephalopathy  Recommendations:  Stat MRI of the brain without contrast If positive for stroke, might need admission for stroke work-up. If the MRI remains  negative, I would look at other etiologies that might explain her change in neurological status- UTI, pneumonia etc. Plan was discussed with ED provider Dr. Rogene Houston over the phone.   This patient is receiving care for possible acute neurological changes. There was 39 minutes of care by this provider at the time of service, including time for direct evaluation via telemedicine, review of medical records, imaging studies and discussion of findings with providers, the patient and/or family.  -- Amie Portland, MD [facility] Pager: [phone] If 7pm to 7am, please call on call as listed on AMION.

## 2020-07-20 NOTE — ED Notes (Addendum)
Pt resting.

## 2020-07-20 NOTE — Discharge Instructions (Signed)
Take the antibiotic Keflex as directed for the next 7 days.  Work-up shows urinary tract infection.  No evidence of any acute stroke.  Evidence of several chronic strokes.  Able for discharge back to nursing facility.  Would recommend having urine rechecked at the end of the 7 days to make sure the urinary tract infection is cleared.  Urine sent for culture.

## 2020-07-20 NOTE — ED Notes (Signed)
Pt in MRI.

## 2020-07-20 NOTE — ED Notes (Signed)
ua sent son at bedside

## 2020-07-20 NOTE — ED Notes (Signed)
Pt discharged with [facility]

## 2020-07-20 NOTE — ED Provider Notes (Addendum)
[facility] EMERGENCY DEPARTMENT Provider Note   CSN: [id number] Arrival date & time: 07/20/20  1201  An emergency department physician performed an initial assessment on this suspected stroke patient at 1208.  History Chief Complaint  Patient presents with  . Code Stroke    [name] is a 70 y.o. female.  Patient from [place].  Last known normal was 0900.  EMS reported patient was having difficulty getting words out they feel that that is improving some now.  There is right facial droop.  And there is right upper extremity weakness.  Patient is wheelchair-bound so does appear to be weak in both lower extremities.        Past Medical History:  Diagnosis Date  . Cellulitis lower extremities   4 day hospitalization 12/25/14-12/29/14  . Essential hypertension, benign   . History of MRSA infection   . History of panniculitis   . Pulmonary emboli ([place]) 2009  . Skin ulcer of thigh ([place])   . Type 2 diabetes mellitus [facility])     Patient Active Problem List   Diagnosis Date Noted  . CVA (cerebral vascular accident) ([place]) 07/24/2018  . Depression 04/12/2015  . TIA (transient ischemic attack) 04/09/2015  . Aphasia 04/09/2015  . Cellulitis and abscess of leg 04/09/2015  . 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  . Fever, unspecified 12/25/2014  . Sepsis ([place]) 12/25/2014  . Cellulitis 12/25/2014  . Essential hypertension, benign 12/20/2011  . History of pulmonary embolus (PE) 12/20/2011  . Morbid obesity ([place]) 12/20/2011  . Preoperative cardiovascular examination 12/20/2011  . Type 2 diabetes mellitus ([place]) 09/11/2006    Past Surgical History:  Procedure Laterality Date  . PARTIAL HYSTERECTOMY    . Right thigh ulcer debridement  2008   Two occasions - wound VAC     OB History    Gravida      Para      Term      Preterm      AB      Living  1     SAB      IAB      Ectopic      Multiple      Live Births              Family History   Problem Relation Age of Onset  . Cancer - Colon Father   . Heart disease Mother   . Diabetes Mellitus II Mother     Social History   Tobacco Use  . Smoking status: Never Smoker  . Smokeless tobacco: Never Used  Vaping Use  . Vaping Use: Never used  Substance Use Topics  . Alcohol use: No  . Drug use: No    Home Medications Prior to Admission medications   Medication Sig Start Date End Date Taking? Authorizing Provider  aspirin 81 MG chewable tablet Chew 1 tablet (81 mg total) by mouth daily. 04/13/15  Yes Sinda Du, MD  atorvastatin (LIPITOR) 40 MG tablet Take 1 tablet by mouth daily. 07/16/20  Yes [provider]  cephALEXin (KEFLEX) 500 MG capsule Take 1 capsule (500 mg total) by mouth 4 (four) times daily. 07/20/20  Yes Fredia Sorrow, MD  cholecalciferol (VITAMIN D3) 25 MCG (1000 UNIT) tablet Take 1,000 Units by mouth daily.   Yes [provider]  ferrous sulfate 325 (65 FE) MG tablet Take 325 mg by mouth daily with breakfast.   Yes [provider]  fluticasone (FLONASE) 50 MCG/ACT nasal spray Place 1 spray  into both nostrils daily.   Yes [provider]  gabapentin (NEURONTIN) 100 MG capsule Take 100 mg by mouth 3 (three) times daily. 07/19/18  Yes [provider]  lisinopril (ZESTRIL) 30 MG tablet Take 15 mg by mouth daily. 06/26/20  Yes [provider]  metFORMIN (GLUCOPHAGE) 1000 MG tablet Take 1 tablet by mouth 2 (two) times a day. 06/30/18  Yes [provider]  oxyCODONE-acetaminophen (PERCOCET/ROXICET) 5-325 MG per tablet Take 1 tablet by mouth 4 (four) times daily.  11/23/11  Yes [provider]  verapamil (CALAN-SR) 180 MG CR tablet Take 1 tablet by mouth daily. 07/01/18  Yes [provider]  vitamin C (ASCORBIC ACID) 500 MG tablet Take 500 mg by mouth daily.   Yes [provider]  XARELTO 20 MG TABS Take 20 mg by mouth daily with supper.  11/29/11  Yes [provider]   ammonium lactate (LAC-HYDRIN) 12 % lotion Apply topically 2 (two) times daily. Patient not taking: Reported on 07/20/2020 04/13/15   Sinda Du, MD  citalopram (CELEXA) 20 MG tablet Take 1 tablet (20 mg total) by mouth daily. Patient not taking: Reported on 07/20/2020 04/13/15   Sinda Du, MD  senna (SENOKOT) 8.6 MG TABS tablet Take 1 tablet (8.6 mg total) by mouth daily as needed for mild constipation. Patient not taking: Reported on 07/20/2020 04/13/15   Sinda Du, MD    Allergies    Vancomycin  Review of Systems   Review of Systems  Unable to perform ROS: Dementia    Physical Exam Updated Vital Signs BP (!) 148/68   Pulse 80   Temp 98.7 F (37.1 C) (Oral)   Resp 16   Ht 1.524 m (5')   Wt 121.8 kg   SpO2 100%   BMI 52.44 kg/m   Physical Exam Vitals and nursing note reviewed.  Constitutional:      General: She is not in acute distress.    Appearance: Normal appearance. She is well-developed.  HENT:     Head: Normocephalic and atraumatic.  Eyes:     Extraocular Movements: Extraocular movements intact.     Conjunctiva/sclera: Conjunctivae normal.     Pupils: Pupils are equal, round, and reactive to light.  Cardiovascular:     Rate and Rhythm: Normal rate and regular rhythm.     Heart sounds: No murmur heard.   Pulmonary:     Effort: Pulmonary effort is normal. No respiratory distress.     Breath sounds: Normal breath sounds.  Abdominal:     Palpations: Abdomen is soft.     Tenderness: There is no abdominal tenderness.  Musculoskeletal:        General: No swelling.     Cervical back: Normal range of motion and neck supple.  Skin:    General: Skin is warm and dry.     Capillary Refill: Capillary refill takes 2 to 3 seconds.  Neurological:     Mental Status: She is alert.     Cranial Nerves: Cranial nerve deficit present.     Motor: Weakness present.     Comments: Patient having difficulty getting words out.  Says her speech is not quite right.   There is right facial droop.  And there seems to be some subtle right upper extremity weakness compared to the left.  Both lower extremities are weak.  But patient is wheelchair-bound.     ED Results / Procedures / Treatments   Labs (all labs ordered are listed, but only abnormal  results are displayed) Labs Reviewed  PROTIME-INR - Abnormal; Notable for the following components:      Result Value   Prothrombin Time 17.0 (*)    INR 1.4 (*)    All other components within normal limits  CBC - Abnormal; Notable for the following components:   Hemoglobin 11.5 (*)    MCHC 29.2 (*)    RDW 15.9 (*)    All other components within normal limits  COMPREHENSIVE METABOLIC PANEL - Abnormal; Notable for the following components:   BUN 29 (*)    Creatinine, Ser 1.27 (*)    Calcium 8.5 (*)    Albumin 3.1 (*)    GFR, Estimated 45 (*)    All other components within normal limits  URINALYSIS, ROUTINE W REFLEX MICROSCOPIC - Abnormal; Notable for the following components:   APPearance HAZY (*)    Protein, ur 100 (*)    Nitrite POSITIVE (*)    Leukocytes,Ua MODERATE (*)    Bacteria, UA MANY (*)    All other components within normal limits  I-STAT CHEM 8, ED - Abnormal; Notable for the following components:   Chloride 112 (*)    BUN 27 (*)    Creatinine, Ser 1.30 (*)    Calcium, Ion 1.10 (*)    All other components within normal limits  RESP PANEL BY RT-PCR (FLU A&B, COVID) ARPGX2  URINE CULTURE  ETHANOL  APTT  DIFFERENTIAL  RAPID URINE DRUG SCREEN, HOSP PERFORMED  CBG MONITORING, ED    EKG EKG Interpretation  Date/Time:  Tuesday Jul 20 2020 12:34:49 EDT Ventricular Rate:  90 PR Interval:  133 QRS Duration: 98 QT Interval:  356 QTC Calculation: 436 R Axis:   -23 Text Interpretation: Sinus rhythm Borderline left axis deviation Low voltage, precordial leads Consider anterior infarct No significant change since last tracing Confirmed by Fredia Sorrow [id number]) on 07/20/2020 1:21:08  PM   Radiology MR ANGIO HEAD WO CONTRAST  Result Date: 07/20/2020 CLINICAL DATA:  Neuro deficit, acute, stroke suspected. Additional history provided: Altered mental status, confusion. EXAM: MRI HEAD WITHOUT CONTRAST MRA HEAD WITHOUT CONTRAST TECHNIQUE: Multiplanar, multi-echo pulse sequences of the brain and surrounding structures were acquired without intravenous contrast. Angiographic images of the Circle of Willis were acquired using MRA technique without intravenous contrast. COMPARISON: No pertinent prior exam. COMPARISON:  Noncontrast head CT 07/20/2020. MRI/MRA head 07/24/2018 FINDINGS: MRI HEAD FINDINGS Brain: Mild cerebral and cerebellar atrophy. Redemonstrated chronic cortically based infarct within the mid left frontal lobe/frontal operculum. Redemonstrated chronic small-vessel infarcts within the left corona radiata, left basal ganglia, left thalamus and left pons. Background moderate multifocal T2/FLAIR hyperintensity within the cerebral white matter and pons, nonspecific but compatible chronic small vessel ischemic disease. Mild chronic small vessel ischemic changes are also again noted within the left cerebellar white matter (series 15, image 4). Chronic microhemorrhages within the right cerebellar hemisphere, brainstem and right thalamus. Findings likely reflect sequela of longstanding poorly controlled hypertension. There is no acute infarct. No evidence of intracranial mass. No extra-axial fluid collection. No midline shift. Vascular: Expected proximal arterial flow voids. Skull and upper cervical spine: No focal marrow lesion. Sinuses/Orbits: Visualized orbits show no acute finding. No significant paranasal sinus disease at the imaged levels MRA HEAD FINDINGS Mildly motion degraded exam. Anterior circulation: The intracranial internal carotid arteries are patent. Mild atherosclerotic irregularity of both vessels without hemodynamically significant stenosis. Atherosclerotic irregularity of  the M2 and more distal middle cerebral artery vessels bilaterally. However, no M2 proximal branch occlusion or  high-grade proximal stenosis is identified. The anterior cerebral arteries are patent. No intracranial aneurysm is identified. Posterior circulation: The intracranial vertebral arteries are patent. The basilar artery is patent. The posterior cerebral arteries are patent without hemodynamically significant proximal stenosis. Posterior communicating arteries are hypoplastic or absent bilaterally. Anatomic variants: As described IMPRESSION: MRI brain: 1. No evidence of acute intracranial abnormality. 2. Redemonstrated chronic cortically-based infarct within the mid left frontal lobe/left frontal operculum. 3. Redemonstrated chronic small-vessel infarcts within the left corona radiata, left basal ganglia, left thalamus and left pons. 4. Stable background mild generalized parenchymal atrophy and moderate chronic small vessel ischemic disease, as described. 5. Chronic microhemorrhages within the cerebellum, brainstem and right thalamus. 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 MRA head: 1. No intracranial large vessel occlusion or proximal high-grade arterial stenosis. 2. Mild atherosclerotic irregularity of the intracranial internal carotid arteries. 3. Atherosclerotic irregularity of the M2 and more distal middle cerebral artery vessels, bilaterally. Electronically Signed   By: Kellie Simmering DO   On: 07/20/2020 14:57   MR Brain Wo Contrast (neuro protocol)  Result Date: 07/20/2020 CLINICAL DATA:  Neuro deficit, acute, stroke suspected. Additional history provided: Altered mental status, confusion. EXAM: MRI HEAD WITHOUT CONTRAST MRA HEAD WITHOUT CONTRAST TECHNIQUE: Multiplanar, multi-echo pulse sequences of the brain and surrounding structures were acquired without intravenous contrast. Angiographic images of the Circle of Willis were acquired using MRA technique without  intravenous contrast. COMPARISON: No pertinent prior exam. COMPARISON:  Noncontrast head CT 07/20/2020. MRI/MRA head 07/24/2018 FINDINGS: MRI HEAD FINDINGS Brain: Mild cerebral and cerebellar atrophy. Redemonstrated chronic cortically based infarct within the mid left frontal lobe/frontal operculum. Redemonstrated chronic small-vessel infarcts within the left corona radiata, left basal ganglia, left thalamus and left pons. Background moderate multifocal T2/FLAIR hyperintensity within the cerebral white matter and pons, nonspecific but compatible chronic small vessel ischemic disease. Mild chronic small vessel ischemic changes are also again noted within the left cerebellar white matter (series 15, image 4). Chronic microhemorrhages within the right cerebellar hemisphere, brainstem and right thalamus. Findings likely reflect sequela of longstanding poorly controlled hypertension. There is no acute infarct. No evidence of intracranial mass. No extra-axial fluid collection. No midline shift. Vascular: Expected proximal arterial flow voids. Skull and upper cervical spine: No focal marrow lesion. Sinuses/Orbits: Visualized orbits show no acute finding. No significant paranasal sinus disease at the imaged levels MRA HEAD FINDINGS Mildly motion degraded exam. Anterior circulation: The intracranial internal carotid arteries are patent. Mild atherosclerotic irregularity of both vessels without hemodynamically significant stenosis. Atherosclerotic irregularity of the M2 and more distal middle cerebral artery vessels bilaterally. However, no M2 proximal branch occlusion or high-grade proximal stenosis is identified. The anterior cerebral arteries are patent. No intracranial aneurysm is identified. Posterior circulation: The intracranial vertebral arteries are patent. The basilar artery is patent. The posterior cerebral arteries are patent without hemodynamically significant proximal stenosis. Posterior communicating arteries  are hypoplastic or absent bilaterally. Anatomic variants: As described IMPRESSION: MRI brain: 1. No evidence of acute intracranial abnormality. 2. Redemonstrated chronic cortically-based infarct within the mid left frontal lobe/left frontal operculum. 3. Redemonstrated chronic small-vessel infarcts within the left corona radiata, left basal ganglia, left thalamus and left pons. 4. Stable background mild generalized parenchymal atrophy and moderate chronic small vessel ischemic disease, as described. 5. Chronic microhemorrhages within the cerebellum, brainstem and right thalamus. 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 MRA head: 1. No intracranial large vessel occlusion or proximal high-grade arterial stenosis. 2. Mild atherosclerotic irregularity of the  intracranial internal carotid arteries. 3. Atherosclerotic irregularity of the M2 and more distal middle cerebral artery vessels, bilaterally. Electronically Signed   By: Kellie Simmering DO   On: 07/20/2020 14:57   CT HEAD CODE STROKE WO CONTRAST  Result Date: 07/20/2020 CLINICAL DATA:  Code stroke.  Right-sided weakness EXAM: CT HEAD WITHOUT CONTRAST TECHNIQUE: Contiguous axial images were obtained from the base of the skull through the vertex without intravenous contrast. COMPARISON:  07/24/2018 FINDINGS: Brain: No acute intracranial hemorrhage, mass effect, or edema. No definite new loss of gray-white differentiation. There is a small chronic infarct of the left frontal lobe. Possible age-indeterminate in left thalamic infarct. Prominence of the ventricles and sulci reflects generalized parenchymal volume loss. Patchy and confluent areas of hypoattenuation in the supratentorial white matter nonspecific but may reflect mild to moderate chronic microvascular ischemic changes. No extra-axial collection. Vascular: No hyperdense vessel. There is intracranial atherosclerotic calcification at the skull base. Skull: Unremarkable  Sinuses/Orbits: Aerated. Other: Mastoid air cells are clear. ASPECTS ([place] Stroke Program Early CT Score) - Ganglionic level infarction (caudate, lentiform nuclei, internal capsule, insula, M1-M3 cortex): 7 - Supraganglionic infarction (M4-M6 cortex): 3 Total score (0-10 with 10 being normal): 10 IMPRESSION: There is no acute intracranial hemorrhage or definite acute infarction. ASPECT score is 10. Possible age-indeterminate small vessel infarct of the left thalamus. Small chronic left frontal infarct. Chronic microvascular ischemic changes. These results were called by telephone at the time of interpretation on 07/20/2020 at 12:20 pm to provider Fredia Sorrow , who verbally acknowledged these results. Electronically Signed   By: Macy Mis M.D.   On: 07/20/2020 12:23    Procedures Procedures   CRITICAL CARE Performed by: Fredia Sorrow Total critical care time: 45 minutes Critical care time was exclusive of separately billable procedures and treating other patients. Critical care was necessary to treat or prevent imminent or life-threatening deterioration. Critical care was time spent personally by me on the following activities: development of treatment plan with patient and/or surrogate as well as nursing, discussions with consultants, evaluation of patient's response to treatment, examination of patient, obtaining history from patient or surrogate, ordering and performing treatments and interventions, ordering and review of laboratory studies, ordering and review of radiographic studies, pulse oximetry and re-evaluation of patient's condition.   Medications Ordered in ED Medications  cefTRIAXone (ROCEPHIN) 1 g in sodium chloride 0.9 % 100 mL IVPB (has no administration in time range)    ED Course  I have reviewed the triage vital signs and the nursing notes.  Pertinent labs & imaging results that were available during my care of the patient were reviewed by me and considered in my  medical decision making (see chart for details).    MDM Rules/Calculators/A&P                          Patient code stroke activated because last normal at 9 in the morning.  Seen by teleneurology.  They appropriately stated that patient not a candidate for tPA and the symptoms were not severe enough for intervention of large vessel blockage.  Be [place] does not a candidate for tPA because she is on blood thinners.  Patient is on Eliquis.  Head CT raise some question of the left thalamus abnormality.  MRI ordered.  Poor Dr. Malen Gauze from teleneurology if it shows evidence of stroke she gets admitted the medicine service.  If it is negative she can go back to [place].  Family is  here now does feel she is having trouble with her words.  It is possible to that she just may be having some memory issues.  MRI brain without any acute infarct.  Urinalysis consistent with urinary tract infection.  Will give Rocephin here.  And continue on Keflex at the nursing facility.  Patient's renal function baseline.  No other significant lab abnormalities.  Urine drug screen negative COVID testing and flu testing negative.      Final Clinical Impression(s) / ED Diagnoses Final diagnoses:  Acute cystitis without hematuria    Rx / DC Orders ED Discharge Orders         Ordered    cephALEXin (KEFLEX) 500 MG capsule  4 times daily        07/20/20 1528           Fredia Sorrow, MD 07/20/20 1439    Fredia Sorrow, MD 07/20/20 1529

## 2020-07-20 NOTE — Progress Notes (Signed)
Code Stroke Time Documentation   1206 Call Time [place] Time 0263 Exam Started  1214 Exam Finished 7858 Images sent to [place] Exam completed in [place] radiology called

## 2020-07-20 NOTE — ED Triage Notes (Addendum)
Pt brought in by [facility] from [facility] with c/o right sided weakness and expressive aphasia by EMT. Pt has right facial droop upon arrival to ED. Dr. Rogene Houston called to bedside and activated Code Stroke.

## 2020-07-22 LAB — URINE CULTURE

## 2020-08-20 ENCOUNTER — Inpatient Hospital Stay (HOSPITAL_COMMUNITY)
Admission: EM | Admit: 2020-08-20 | Discharge: 2020-08-24 | DRG: 689 | Disposition: A | Discharge status: Skilled Nursing Facility | Payer: Medicare (Managed Care) | Source: Skilled Nursing Facility | Attending: Internal Medicine | Admitting: Internal Medicine

## 2020-08-20 ENCOUNTER — Emergency Department (HOSPITAL_COMMUNITY): Payer: Medicare (Managed Care)

## 2020-08-20 ENCOUNTER — Other Ambulatory Visit: Payer: Self-pay

## 2020-08-20 ENCOUNTER — Encounter (HOSPITAL_COMMUNITY): Payer: Self-pay

## 2020-08-20 DIAGNOSIS — Z20822 Contact with and (suspected) exposure to covid-19: Secondary | ICD-10-CM | POA: Diagnosis present

## 2020-08-20 DIAGNOSIS — L89152 Pressure ulcer of sacral region, stage 2: Secondary | ICD-10-CM | POA: Diagnosis present

## 2020-08-20 DIAGNOSIS — Z79899 Other long term (current) drug therapy: Secondary | ICD-10-CM

## 2020-08-20 DIAGNOSIS — E8809 Other disorders of plasma-protein metabolism, not elsewhere classified: Secondary | ICD-10-CM | POA: Diagnosis present

## 2020-08-20 DIAGNOSIS — I10 Essential (primary) hypertension: Secondary | ICD-10-CM | POA: Diagnosis present

## 2020-08-20 DIAGNOSIS — F329 Major depressive disorder, single episode, unspecified: Secondary | ICD-10-CM | POA: Diagnosis present

## 2020-08-20 DIAGNOSIS — Z6841 Body Mass Index (BMI) 40.0 and over, adult: Secondary | ICD-10-CM

## 2020-08-20 DIAGNOSIS — Z993 Dependence on wheelchair: Secondary | ICD-10-CM | POA: Diagnosis not present

## 2020-08-20 DIAGNOSIS — L89322 Pressure ulcer of left buttock, stage 2: Secondary | ICD-10-CM | POA: Diagnosis present

## 2020-08-20 DIAGNOSIS — R6510 Systemic inflammatory response syndrome (SIRS) of non-infectious origin without acute organ dysfunction: Secondary | ICD-10-CM | POA: Diagnosis not present

## 2020-08-20 DIAGNOSIS — G9340 Encephalopathy, unspecified: Secondary | ICD-10-CM | POA: Diagnosis not present

## 2020-08-20 DIAGNOSIS — E440 Moderate protein-calorie malnutrition: Secondary | ICD-10-CM | POA: Diagnosis present

## 2020-08-20 DIAGNOSIS — Z7901 Long term (current) use of anticoagulants: Secondary | ICD-10-CM | POA: Diagnosis not present

## 2020-08-20 DIAGNOSIS — E119 Type 2 diabetes mellitus without complications: Secondary | ICD-10-CM | POA: Diagnosis present

## 2020-08-20 DIAGNOSIS — N390 Urinary tract infection, site not specified: Secondary | ICD-10-CM | POA: Diagnosis present

## 2020-08-20 DIAGNOSIS — E875 Hyperkalemia: Secondary | ICD-10-CM | POA: Diagnosis present

## 2020-08-20 DIAGNOSIS — E46 Unspecified protein-calorie malnutrition: Secondary | ICD-10-CM

## 2020-08-20 DIAGNOSIS — R509 Fever, unspecified: Secondary | ICD-10-CM

## 2020-08-20 DIAGNOSIS — Z7982 Long term (current) use of aspirin: Secondary | ICD-10-CM

## 2020-08-20 DIAGNOSIS — Z8673 Personal history of transient ischemic attack (TIA), and cerebral infarction without residual deficits: Secondary | ICD-10-CM

## 2020-08-20 DIAGNOSIS — Z90711 Acquired absence of uterus with remaining cervical stump: Secondary | ICD-10-CM | POA: Diagnosis not present

## 2020-08-20 DIAGNOSIS — Z66 Do not resuscitate: Secondary | ICD-10-CM | POA: Diagnosis present

## 2020-08-20 DIAGNOSIS — Z86711 Personal history of pulmonary embolism: Secondary | ICD-10-CM | POA: Diagnosis not present

## 2020-08-20 DIAGNOSIS — Z7984 Long term (current) use of oral hypoglycemic drugs: Secondary | ICD-10-CM | POA: Diagnosis not present

## 2020-08-20 DIAGNOSIS — D6489 Other specified anemias: Secondary | ICD-10-CM | POA: Diagnosis present

## 2020-08-20 DIAGNOSIS — G459 Transient cerebral ischemic attack, unspecified: Secondary | ICD-10-CM | POA: Diagnosis not present

## 2020-08-20 DIAGNOSIS — I639 Cerebral infarction, unspecified: Secondary | ICD-10-CM | POA: Diagnosis present

## 2020-08-20 DIAGNOSIS — G9341 Metabolic encephalopathy: Secondary | ICD-10-CM | POA: Diagnosis present

## 2020-08-20 DIAGNOSIS — R55 Syncope and collapse: Secondary | ICD-10-CM | POA: Diagnosis not present

## 2020-08-20 LAB — CBC WITH DIFFERENTIAL/PLATELET
Abs Immature Granulocytes: 0.05 10*3/uL (ref 0.00–0.07)
Basophils Absolute: 0 10*3/uL (ref 0.0–0.1)
Basophils Relative: 0 %
Eosinophils Absolute: 0 10*3/uL (ref 0.0–0.5)
Eosinophils Relative: 0 %
HCT: 36.8 % (ref 36.0–46.0)
Hemoglobin: 10.8 g/dL — ABNORMAL LOW (ref 12.0–15.0)
Immature Granulocytes: 1 %
Lymphocytes Relative: 11 %
Lymphs Abs: 0.8 10*3/uL (ref 0.7–4.0)
MCH: 26.4 pg (ref 26.0–34.0)
MCHC: 29.3 g/dL — ABNORMAL LOW (ref 30.0–36.0)
MCV: 90 fL (ref 80.0–100.0)
Monocytes Absolute: 0.4 10*3/uL (ref 0.1–1.0)
Monocytes Relative: 5 %
Neutro Abs: 6 10*3/uL (ref 1.7–7.7)
Neutrophils Relative %: 83 %
Platelets: 289 10*3/uL (ref 150–400)
RBC: 4.09 MIL/uL (ref 3.87–5.11)
RDW: 17 % — ABNORMAL HIGH (ref 11.5–15.5)
WBC: 7.3 10*3/uL (ref 4.0–10.5)
nRBC: 0 % (ref 0.0–0.2)

## 2020-08-20 LAB — RESP PANEL BY RT-PCR (FLU A&B, COVID) ARPGX2
Influenza A by PCR: NEGATIVE
Influenza B by PCR: NEGATIVE
SARS Coronavirus 2 by RT PCR: NEGATIVE

## 2020-08-20 LAB — URINALYSIS, ROUTINE W REFLEX MICROSCOPIC
Bilirubin Urine: NEGATIVE
Glucose, UA: NEGATIVE mg/dL
Hgb urine dipstick: NEGATIVE
Ketones, ur: 5 mg/dL — AB
Nitrite: NEGATIVE
Protein, ur: NEGATIVE mg/dL
Specific Gravity, Urine: 1.009 (ref 1.005–1.030)
pH: 5 (ref 5.0–8.0)

## 2020-08-20 LAB — LACTIC ACID, PLASMA
Lactic Acid, Venous: 1.3 mmol/L (ref 0.5–1.9)
Lactic Acid, Venous: 1.9 mmol/L (ref 0.5–1.9)

## 2020-08-20 LAB — COMPREHENSIVE METABOLIC PANEL
ALT: 17 U/L (ref 0–44)
AST: 19 U/L (ref 15–41)
Albumin: 2.9 g/dL — ABNORMAL LOW (ref 3.5–5.0)
Alkaline Phosphatase: 53 U/L (ref 38–126)
Anion gap: 8 (ref 5–15)
BUN: 20 mg/dL (ref 8–23)
CO2: 22 mmol/L (ref 22–32)
Calcium: 8.5 mg/dL — ABNORMAL LOW (ref 8.9–10.3)
Chloride: 108 mmol/L (ref 98–111)
Creatinine, Ser: 1.02 mg/dL — ABNORMAL HIGH (ref 0.44–1.00)
GFR, Estimated: 59 mL/min — ABNORMAL LOW (ref >60)
Glucose, Bld: 82 mg/dL (ref 70–99)
Potassium: 5.3 mmol/L — ABNORMAL HIGH (ref 3.5–5.1)
Sodium: 138 mmol/L (ref 135–145)
Total Bilirubin: 0.2 mg/dL — ABNORMAL LOW (ref 0.3–1.2)
Total Protein: 6.7 g/dL (ref 6.5–8.1)

## 2020-08-20 LAB — APTT: aPTT: 28 seconds (ref 24–36)

## 2020-08-20 MED ORDER — LACTATED RINGERS IV BOLUS (SEPSIS)
1000.0000 mL | Freq: Once | INTRAVENOUS | Status: AC
  Administered 2020-08-20: 1000 mL via INTRAVENOUS

## 2020-08-20 MED ORDER — LACTATED RINGERS IV BOLUS (SEPSIS)
500.0000 mL | Freq: Once | INTRAVENOUS | Status: AC
  Administered 2020-08-20: 500 mL via INTRAVENOUS

## 2020-08-20 MED ORDER — SODIUM CHLORIDE 0.9 % IV SOLN
2.0000 g | Freq: Once | INTRAVENOUS | Status: AC
  Administered 2020-08-20: 2 g via INTRAVENOUS
  Filled 2020-08-20: qty 2

## 2020-08-20 MED ORDER — LACTATED RINGERS IV SOLN: INTRAVENOUS | Status: DC

## 2020-08-20 NOTE — H&P (Signed)
History and Physical  [name] ZOX:[id number] DOB: 1950/05/02 DOA: 08/20/2020  Referring physician: Milton Ferguson, MD PCP: [name] Renshaw, MD  Patient coming from: [facility]  Chief Complaint: Altered mental status  HPI: [name] is a 70 y.o. female with medical history significant for  prior PE with current Xarelto, type 2 diabetes, hypertension, recurrent cellulitis, prior TIA on 03/2015, prior stroke (07/24/2018) and chronic ambulatory dysfunction-wheelchair-bound who presented to the ED from [facility] who presents to the emergency department from [facility] due to altered mental status.  Patient was unable to provide history, history was obtained from son at bedside and ED physician.  Per report, patient was being treated for UTI with IM Rocephin since the last 3 days, today, she was noted to be febrile and was more confused, so EMS was activated and patient was taken to the ED for further evaluation and management.  ED Course:  In the emergency department, she was noted to be febrile with a temperature of 102.42F, she was tachycardic, but other vital signs were within normal range.  Work-up in the ED showed normocytic anemia, hyperkalemia, hypoalbuminemia, influenza A, B, SARS coronavirus 2 was negative.  Urinalysis was unimpressive for UTI, lactic acid was 1.3 > 1.9. Chest x-ray showed no active cardiopulmonary disease. IV hydration was provided and patient was empirically started on IV cefepime.  Hospitalist was asked to admit.  For further evaluation and management.  Review of Systems: Constitutional: Positive for fever negative for chills  HENT: Negative for ear pain and sore throat.   Eyes: Negative for pain and visual disturbance.  Respiratory: Negative for cough, chest tightness and shortness of breath.   Cardiovascular: Negative for chest pain and palpitations.  Gastrointestinal: Negative for abdominal pain and vomiting.  Endocrine: Negative for polyphagia and  polyuria.  Genitourinary: Negative for decreased urine volume, dysuria, enuresis Musculoskeletal: Negative for arthralgias and back pain.  Skin: Negative for color change and rash.  Allergic/Immunologic: Negative for immunocompromised state.  Neurological: Positive for confusion.  Negative for tremors, syncope, speech difficulty Hematological: Does not bruise/bleed easily.  All other systems reviewed and are negative  Past Medical History:  Diagnosis Date   Cellulitis lower extremities   4 day hospitalization 12/25/14-12/29/14   Essential hypertension, benign    History of MRSA infection    History of panniculitis    Pulmonary emboli ([place]) 2009   Skin ulcer of thigh ([place])    Type 2 diabetes mellitus ([place])    Past Surgical History:  Procedure Laterality Date   PARTIAL HYSTERECTOMY     Right thigh ulcer debridement  2008   Two occasions - wound VAC    Social History:  reports that she has never smoked. She has never used smokeless tobacco. She reports that she does not drink alcohol and does not use drugs.   Allergies  Allergen Reactions   Vancomycin Hives and Rash    "big red bumps, then they turned white, then I shedded like a snake."     Family History  Problem Relation Age of Onset   Cancer - Colon Father    Heart disease Mother    Diabetes Mellitus II Mother      Prior to Admission medications   Medication Sig Start Date End Date Taking? Authorizing Provider  ammonium lactate (LAC-HYDRIN) 12 % lotion Apply topically 2 (two) times daily. Patient not taking: Reported on 07/20/2020 04/13/15   Sinda Du, MD  aspirin 81 MG chewable tablet Chew 1 tablet (81  mg total) by mouth daily. 04/13/15   Sinda Du, MD  atorvastatin (LIPITOR) 40 MG tablet Take 1 tablet by mouth daily. 07/16/20   [provider]  cephALEXin (KEFLEX) 500 MG capsule Take 1 capsule (500 mg total) by mouth 4 (four) times daily. 07/20/20   Fredia Sorrow, MD  cholecalciferol (VITAMIN D3)  25 MCG (1000 UNIT) tablet Take 1,000 Units by mouth daily.    [provider]  citalopram (CELEXA) 20 MG tablet Take 1 tablet (20 mg total) by mouth daily. Patient not taking: Reported on 07/20/2020 04/13/15   Sinda Du, MD  ferrous sulfate 325 (65 FE) MG tablet Take 325 mg by mouth daily with breakfast.    [provider]  fluticasone (FLONASE) 50 MCG/ACT nasal spray Place 1 spray into both nostrils daily.    [provider]  gabapentin (NEURONTIN) 100 MG capsule Take 100 mg by mouth 3 (three) times daily. 07/19/18   [provider]  lisinopril (ZESTRIL) 30 MG tablet Take 15 mg by mouth daily. 06/26/20   [provider]  metFORMIN (GLUCOPHAGE) 1000 MG tablet Take 1 tablet by mouth 2 (two) times a day. 06/30/18   [provider]  oxyCODONE-acetaminophen (PERCOCET/ROXICET) 5-325 MG per tablet Take 1 tablet by mouth 4 (four) times daily.  11/23/11   [provider]  senna (SENOKOT) 8.6 MG TABS tablet Take 1 tablet (8.6 mg total) by mouth daily as needed for mild constipation. Patient not taking: Reported on 07/20/2020 04/13/15   Sinda Du, MD  verapamil (CALAN-SR) 180 MG CR tablet Take 1 tablet by mouth daily. 07/01/18   [provider]  vitamin C (ASCORBIC ACID) 500 MG tablet Take 500 mg by mouth daily.    [provider]  XARELTO 20 MG TABS Take 20 mg by mouth daily with supper.  11/29/11   [provider]    Physical Exam: BP 116/90   Pulse (!) 102   Temp (!) 100.8 F (38.2 C)   Resp 19   Ht 5' (1.524 m)   Wt 102.1 kg   SpO2 96%   BMI 43.94 kg/m   General: 70 y.o. year-old female well developed well nourished in no acute distress.  Alert and oriented x2 (person and place). HEENT: NCAT, EOMI Neck: Supple, trachea medial Cardiovascular: Tachycardia.  Regular rate and rhythm with no rubs or gallops.  No thyromegaly or JVD noted.  2/4 pulses in all 4 extremities. Respiratory: Clear to auscultation  with no wheezes or rales. Good inspiratory effort. Abdomen: Soft, nontender nondistended with normal bowel sounds x4 quadrants. Muskuloskeletal: No cyanosis, clubbing or edema noted bilaterally Neuro: CN II-XII intact, sensation intact, motor strength appears to be asymmetric in upper extremity, bilateral weakness in lower extremities. Skin: No ulcerative lesions noted or rashes Psychiatry: Sinus tachycardia at a rate of 104.  Mood is appropriate for condition and setting          Labs on Admission:  Basic Metabolic Panel: Recent Labs  Lab 08/20/20 2213  NA 138  K 5.3*  CL 108  CO2 22  GLUCOSE 82  BUN 20  CREATININE 1.02*  CALCIUM 8.5*   Liver Function Tests: Recent Labs  Lab 08/20/20 2213  AST 19  ALT 17  ALKPHOS 53  BILITOT 0.2*  PROT 6.7  ALBUMIN 2.9*   No results for input(s): LIPASE, AMYLASE in the last 168 hours. No results for input(s): AMMONIA in the last 168 hours. CBC: Recent Labs  Lab 08/20/20 2030  WBC  7.3  NEUTROABS 6.0  HGB 10.8*  HCT 36.8  MCV 90.0  PLT 289   Cardiac Enzymes: No results for input(s): CKTOTAL, CKMB, CKMBINDEX, TROPONINI in the last 168 hours.  BNP (last 3 results) No results for input(s): BNP in the last 8760 hours.  ProBNP (last 3 results) No results for input(s): PROBNP in the last 8760 hours.  CBG: No results for input(s): GLUCAP in the last 168 hours.  Radiological Exams on Admission: DG Chest Port 1 View  Result Date: 08/20/2020 CLINICAL DATA:  Increased confusion. EXAM: PORTABLE CHEST 1 VIEW COMPARISON:  April 09, 2015 FINDINGS: Low lung volumes are seen with mild, chronic appearing diffusely increased lung markings. There is no evidence of acute infiltrate, pleural effusion or pneumothorax. The heart size and mediastinal contours are within normal limits. There is marked severity calcification of the aortic arch. The visualized skeletal structures are unremarkable. IMPRESSION: No active cardiopulmonary disease.  Electronically Signed   By: Virgina Norfolk M.D.   On: 08/20/2020 21:07    EKG: I independently viewed the EKG done and my findings are as followed: Sinus tachycardia at a rate of 104 bpm  Assessment/Plan Present on Admission:  Acute metabolic encephalopathy  Principal Problem:   Acute metabolic encephalopathy Active Problems:   SIRS (systemic inflammatory response syndrome) (HCC)   UTI (urinary tract infection)   Hyperkalemia   Hypoalbuminemia due to protein-calorie malnutrition (HCC)  Acute metabolic encephalopathy possibly due to failure of outpatient treatment of presumed UTI POA SIRS in the setting of above with suspicion for sepsis Patient was febrile and tachycardic (met SIRS criteria), source of infection was presumed to be UTI due to urinalysis was unimpressive for UTI and patient was currently not complaining of any irritative bladder symptoms, however, she was already treated with IM Rocephin x3 days prior to presentation IV hydration was provided, patient was started on IV cefepime, we shall continue with same at this time we will plan to de-escalate/discontinue based on urine culture Continue Tylenol as needed Patient's mental status has improved since arrival to the ED, but not yet back to baseline per son at bedside  Hyperkalemia K+ 5.3, no EKG changes IV hydration provided Continue to monitor potassium level with morning labs  Hypoalbuminemia secondary to moderate protein calorie malnutrition Protein supplement will be provided  History of CVA Continue aspirin, Xarelto and statin  History of pulmonary embolus Continuing Xarelto  T2DM Continue ISS and hypoglycemia protocol Metformin will be held at this time  Essential hypertension Continue lisinopril  Normocytic anemia Hemoglobin within baseline range Continue ferrous sulfate Continue senna as needed for mild constipation  Chronic ambulatory dysfunction Continue Neurontin Continue fall precaution  and neurochecks Continue PT/OT eval and treat  Obesity class III (BMI 43.94) Patient will need to follow-up with outpatient PCP for weight loss program  Order home meds: Vitamin D3, Flonase, vitamin C  DVT prophylaxis: Xarelto  Code Status: DNR  Family Communication: Son at bedside (all questions answered to satisfaction)  Disposition Plan:  Patient is from:                        home Anticipated DC to:                   SNF or family members home Anticipated DC date:               2-3 days Anticipated DC barriers:          Patient  requires inpatient management at this time due to acute metabolic neuropathy and presumed failure of outpatient treatment of UTI  Consults called: None  Admission status: Inpatient    Bernadette Hoit MD [facility]   08/21/2020, 12:19 AM

## 2020-08-20 NOTE — Progress Notes (Signed)
Code Sepsis tracking by eLINK 

## 2020-08-20 NOTE — ED Provider Notes (Signed)
[facility] EMERGENCY DEPARTMENT Provider Note   CSN: [id number] Arrival date & time: 08/20/20  2005     History Chief Complaint  Patient presents with   Altered Mental Status    [name] is a 70 y.o. female.  According to the patient's nurse practitioner at the nursing home, the patient has been getting Rocephin for the last 3 days for urinary tract infection.  She developed a fever and weakness today slight confusion  The history is provided by the patient and the nursing home. No language interpreter was used.  Altered Mental Status Presenting symptoms: behavior changes   Severity:  Mild Most recent episode:  Today Episode history:  Multiple Timing:  Intermittent Progression:  Resolved Chronicity:  New Context: not alcohol use   Associated symptoms: fever   Associated symptoms: no abdominal pain, no hallucinations, no headaches, no rash and no seizures       Past Medical History:  Diagnosis Date   Cellulitis lower extremities   4 day hospitalization 12/25/14-12/29/14   Essential hypertension, benign    History of MRSA infection    History of panniculitis    Pulmonary emboli ([place]) 2009   Skin ulcer of thigh ([place])    Type 2 diabetes mellitus ([place])     Patient Active Problem List   Diagnosis Date Noted   CVA (cerebral vascular accident) ([place]) 07/24/2018   Depression 04/12/2015   TIA (transient ischemic attack) 04/09/2015   Aphasia 04/09/2015   Cellulitis and abscess of leg 04/09/2015   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   Fever, unspecified 12/25/2014   Sepsis ([place]) 12/25/2014   Cellulitis 12/25/2014   Essential hypertension, benign 12/20/2011   History of pulmonary embolus (PE) 12/20/2011   Morbid obesity ([place]) 12/20/2011   Preoperative cardiovascular examination 12/20/2011   Type 2 diabetes mellitus ([place]) 09/11/2006    Past Surgical History:  Procedure Laterality Date   PARTIAL HYSTERECTOMY     Right thigh ulcer debridement  2008   Two occasions -  wound VAC     OB History     Gravida      Para      Term      Preterm      AB      Living  1      SAB      IAB      Ectopic      Multiple      Live Births              Family History  Problem Relation Age of Onset   Cancer - Colon Father    Heart disease Mother    Diabetes Mellitus II Mother     Social History   Tobacco Use   Smoking status: Never   Smokeless tobacco: Never  Vaping Use   Vaping Use: Never used  Substance Use Topics   Alcohol use: No   Drug use: No    Home Medications Prior to Admission medications   Medication Sig Start Date End Date Taking? Authorizing Provider  ammonium lactate (LAC-HYDRIN) 12 % lotion Apply topically 2 (two) times daily. Patient not taking: Reported on 07/20/2020 04/13/15   Sinda Du, MD  aspirin 81 MG chewable tablet Chew 1 tablet (81 mg total) by mouth daily. 04/13/15   Sinda Du, MD  atorvastatin (LIPITOR) 40 MG tablet Take 1 tablet by mouth daily. 07/16/20   [provider]  cephALEXin (KEFLEX) 500 MG capsule Take 1 capsule (500 mg total) by mouth  4 (four) times daily. 07/20/20   Fredia Sorrow, MD  cholecalciferol (VITAMIN D3) 25 MCG (1000 UNIT) tablet Take 1,000 Units by mouth daily.    [provider]  citalopram (CELEXA) 20 MG tablet Take 1 tablet (20 mg total) by mouth daily. Patient not taking: Reported on 07/20/2020 04/13/15   Sinda Du, MD  ferrous sulfate 325 (65 FE) MG tablet Take 325 mg by mouth daily with breakfast.    [provider]  fluticasone (FLONASE) 50 MCG/ACT nasal spray Place 1 spray into both nostrils daily.    [provider]  gabapentin (NEURONTIN) 100 MG capsule Take 100 mg by mouth 3 (three) times daily. 07/19/18   [provider]  lisinopril (ZESTRIL) 30 MG tablet Take 15 mg by mouth daily. 06/26/20   [provider]  metFORMIN (GLUCOPHAGE) 1000 MG tablet Take 1 tablet by mouth 2 (two) times a day. 06/30/18   [provider]  oxyCODONE-acetaminophen (PERCOCET/ROXICET) 5-325 MG per tablet Take 1 tablet by mouth 4 (four) times daily.  11/23/11   [provider]  senna (SENOKOT) 8.6 MG TABS tablet Take 1 tablet (8.6 mg total) by mouth daily as needed for mild constipation. Patient not taking: Reported on 07/20/2020 04/13/15   Sinda Du, MD  verapamil (CALAN-SR) 180 MG CR tablet Take 1 tablet by mouth daily. 07/01/18   [provider]  vitamin C (ASCORBIC ACID) 500 MG tablet Take 500 mg by mouth daily.    [provider]  XARELTO 20 MG TABS Take 20 mg by mouth daily with supper.  11/29/11   [provider]    Allergies    Vancomycin  Review of Systems   Review of Systems  Constitutional:  Positive for fever. Negative for appetite change and fatigue.  HENT:  Negative for congestion, ear discharge and sinus pressure.   Eyes:  Negative for discharge.  Respiratory:  Negative for cough.   Cardiovascular:  Negative for chest pain.  Gastrointestinal:  Negative for abdominal pain and diarrhea.  Genitourinary:  Negative for frequency and hematuria.  Musculoskeletal:  Negative for back pain.  Skin:  Negative for rash.  Neurological:  Negative for seizures and headaches.  Psychiatric/Behavioral:  Negative for hallucinations.    Physical Exam Updated Vital Signs BP 108/87   Pulse (!) 104   Temp (!) 102.6 F (39.2 C) (Oral)   Resp 19   Ht 5' (1.524 m)   Wt 102.1 kg   SpO2 92%   BMI 43.94 kg/m   Physical Exam Vitals and nursing note reviewed.  Constitutional:      Appearance: She is well-developed.  HENT:     Head: Normocephalic.     Nose: Nose normal.  Eyes:     General: No scleral icterus.    Conjunctiva/sclera: Conjunctivae normal.  Neck:     Thyroid: No thyromegaly.  Cardiovascular:     Rate and Rhythm: Normal rate and regular rhythm.     Heart sounds: No murmur heard.   No friction rub. No gallop.  Pulmonary:     Breath sounds: No stridor. No  wheezing or rales.  Chest:     Chest wall: No tenderness.  Abdominal:     General: There is no distension.     Tenderness: There is no abdominal tenderness. There is no rebound.  Musculoskeletal:        General: Normal range of motion.     Cervical back: Neck supple.  Lymphadenopathy:     Cervical: No  cervical adenopathy.  Skin:    Findings: No erythema or rash.  Neurological:     Mental Status: She is alert and oriented to person, place, and time.     Motor: No abnormal muscle tone.     Coordination: Coordination normal.  Psychiatric:        Behavior: Behavior normal.    ED Results / Procedures / Treatments   Labs (all labs ordered are listed, but only abnormal results are displayed) Labs Reviewed  CBC WITH DIFFERENTIAL/PLATELET - Abnormal; Notable for the following components:      Result Value   Hemoglobin 10.8 (*)    MCHC 29.3 (*)    RDW 17.0 (*)    All other components within normal limits  RESP PANEL BY RT-PCR (FLU A&B, COVID) ARPGX2  CULTURE, BLOOD (ROUTINE X 2)  CULTURE, BLOOD (ROUTINE X 2)  URINE CULTURE  LACTIC ACID, PLASMA  LACTIC ACID, PLASMA  URINALYSIS, ROUTINE W REFLEX MICROSCOPIC  COMPREHENSIVE METABOLIC PANEL  APTT    EKG None  Radiology DG Chest Port 1 View  Result Date: 08/20/2020 CLINICAL DATA:  Increased confusion. EXAM: PORTABLE CHEST 1 VIEW COMPARISON:  April 09, 2015 FINDINGS: Low lung volumes are seen with mild, chronic appearing diffusely increased lung markings. There is no evidence of acute infiltrate, pleural effusion or pneumothorax. The heart size and mediastinal contours are within normal limits. There is marked severity calcification of the aortic arch. The visualized skeletal structures are unremarkable. IMPRESSION: No active cardiopulmonary disease. Electronically Signed   By: Virgina Norfolk M.D.   On: 08/20/2020 21:07    Procedures Procedures   Medications Ordered in ED Medications  lactated ringers infusion ( Intravenous  New Bag/Given 08/20/20 2107)  lactated ringers bolus 1,000 mL (0 mLs Intravenous Stopped 08/20/20 2233)    And  lactated ringers bolus 500 mL (0 mLs Intravenous Stopped 08/20/20 2233)  ceFEPIme (MAXIPIME) 2 g in sodium chloride 0.9 % 100 mL IVPB (0 g Intravenous Stopped 08/20/20 2233)    ED Course  I have reviewed the triage vital signs and the nursing notes.  Pertinent labs & imaging results that were available during my care of the patient were reviewed by me and considered in my medical decision making (see chart for details).  Patient with fever and failed urinary tract infection.  She will be admitted to the hospitalist and started on IV antibiotics MDM Rules/Calculators/A&P                          Febrile illness most likely related to urinary tract infection Final Clinical Impression(s) / ED Diagnoses Final diagnoses:  None    Rx / DC Orders ED Discharge Orders     None        Milton Ferguson, MD 08/20/20 2339

## 2020-08-20 NOTE — ED Triage Notes (Signed)
Pt to er room number 10 via ems, per ems pt is staying at [facility] and was dx with a uti three days ago, states that she is here today because she is having increased confusion. Pt awake, pt oriented to person, place, pt doesn't know day of the week, re oriented pt.  Pt denies pain.

## 2020-08-21 DIAGNOSIS — R6510 Systemic inflammatory response syndrome (SIRS) of non-infectious origin without acute organ dysfunction: Secondary | ICD-10-CM

## 2020-08-21 DIAGNOSIS — E8809 Other disorders of plasma-protein metabolism, not elsewhere classified: Secondary | ICD-10-CM

## 2020-08-21 DIAGNOSIS — N390 Urinary tract infection, site not specified: Secondary | ICD-10-CM

## 2020-08-21 DIAGNOSIS — G9341 Metabolic encephalopathy: Secondary | ICD-10-CM | POA: Diagnosis not present

## 2020-08-21 DIAGNOSIS — E875 Hyperkalemia: Secondary | ICD-10-CM

## 2020-08-21 LAB — CBC
HCT: 32 % — ABNORMAL LOW (ref 36.0–46.0)
Hemoglobin: 9.6 g/dL — ABNORMAL LOW (ref 12.0–15.0)
MCH: 26.8 pg (ref 26.0–34.0)
MCHC: 30 g/dL (ref 30.0–36.0)
MCV: 89.4 fL (ref 80.0–100.0)
Platelets: 221 10*3/uL (ref 150–400)
RBC: 3.58 MIL/uL — ABNORMAL LOW (ref 3.87–5.11)
RDW: 16.6 % — ABNORMAL HIGH (ref 11.5–15.5)
WBC: 5.7 10*3/uL (ref 4.0–10.5)
nRBC: 0 % (ref 0.0–0.2)

## 2020-08-21 LAB — COMPREHENSIVE METABOLIC PANEL
ALT: 17 U/L (ref 0–44)
AST: 18 U/L (ref 15–41)
Albumin: 2.4 g/dL — ABNORMAL LOW (ref 3.5–5.0)
Alkaline Phosphatase: 46 U/L (ref 38–126)
Anion gap: 9 (ref 5–15)
BUN: 18 mg/dL (ref 8–23)
CO2: 23 mmol/L (ref 22–32)
Calcium: 8.5 mg/dL — ABNORMAL LOW (ref 8.9–10.3)
Chloride: 107 mmol/L (ref 98–111)
Creatinine, Ser: 0.88 mg/dL (ref 0.44–1.00)
GFR, Estimated: >60 mL/min (ref >60)
Glucose, Bld: 83 mg/dL (ref 70–99)
Potassium: 4.7 mmol/L (ref 3.5–5.1)
Sodium: 139 mmol/L (ref 135–145)
Total Bilirubin: 0.4 mg/dL (ref 0.3–1.2)
Total Protein: 5.6 g/dL — ABNORMAL LOW (ref 6.5–8.1)

## 2020-08-21 LAB — APTT: aPTT: 29 seconds (ref 24–36)

## 2020-08-21 LAB — HIV ANTIBODY (ROUTINE TESTING W REFLEX): HIV Screen 4th Generation wRfx: NONREACTIVE

## 2020-08-21 LAB — PROTIME-INR
INR: 1.1 (ref 0.8–1.2)
Prothrombin Time: 14.2 seconds (ref 11.4–15.2)

## 2020-08-21 LAB — MRSA NEXT GEN BY PCR, NASAL: MRSA by PCR Next Gen: NOT DETECTED

## 2020-08-21 LAB — PHOSPHORUS: Phosphorus: 3.2 mg/dL (ref 2.5–4.6)

## 2020-08-21 LAB — GLUCOSE, CAPILLARY
Glucose-Capillary: 87 mg/dL (ref 70–99)
Glucose-Capillary: 80 mg/dL (ref 70–99)
Glucose-Capillary: 86 mg/dL (ref 70–99)
Glucose-Capillary: 118 mg/dL — ABNORMAL HIGH (ref 70–99)
Glucose-Capillary: 108 mg/dL — ABNORMAL HIGH (ref 70–99)

## 2020-08-21 LAB — MAGNESIUM: Magnesium: 1.3 mg/dL — ABNORMAL LOW (ref 1.7–2.4)

## 2020-08-21 MED ORDER — MAGNESIUM SULFATE 2 GM/50ML IV SOLN
2.0000 g | Freq: Once | INTRAVENOUS | Status: AC
  Administered 2020-08-21: 2 g via INTRAVENOUS

## 2020-08-21 MED ORDER — SENNA 8.6 MG PO TABS: 1.0000 | ORAL_TABLET | Freq: Every day | ORAL | Status: DC | PRN

## 2020-08-21 MED ORDER — GABAPENTIN 100 MG PO CAPS
100.0000 mg | ORAL_CAPSULE | Freq: Three times a day (TID) | ORAL | Status: DC
  Administered 2020-08-21 – 2020-08-24 (×10): 100 mg via ORAL
  Filled 2020-08-21 (×10): qty 1

## 2020-08-21 MED ORDER — ACETAMINOPHEN 325 MG PO TABS
650.0000 mg | ORAL_TABLET | Freq: Four times a day (QID) | ORAL | Status: DC | PRN
  Administered 2020-08-21 (×2): 650 mg via ORAL
  Filled 2020-08-21 (×2): qty 2

## 2020-08-21 MED ORDER — RIVAROXABAN 20 MG PO TABS
20.0000 mg | ORAL_TABLET | Freq: Every day | ORAL | Status: DC
  Administered 2020-08-21 – 2020-08-23 (×3): 20 mg via ORAL
  Filled 2020-08-21 (×3): qty 1

## 2020-08-21 MED ORDER — LISINOPRIL 5 MG PO TABS
15.0000 mg | ORAL_TABLET | Freq: Every day | ORAL | Status: DC
  Administered 2020-08-21 – 2020-08-24 (×4): 15 mg via ORAL
  Filled 2020-08-21 (×4): qty 1

## 2020-08-21 MED ORDER — SODIUM CHLORIDE 0.9 % IV SOLN
2.0000 g | Freq: Two times a day (BID) | INTRAVENOUS | Status: DC
  Administered 2020-08-21 – 2020-08-23 (×5): 2 g via INTRAVENOUS
  Filled 2020-08-21 (×5): qty 2

## 2020-08-21 MED ORDER — ATORVASTATIN CALCIUM 40 MG PO TABS
40.0000 mg | ORAL_TABLET | Freq: Every day | ORAL | Status: DC
  Administered 2020-08-21 – 2020-08-24 (×4): 40 mg via ORAL
  Filled 2020-08-21 (×4): qty 1

## 2020-08-21 MED ORDER — FERROUS SULFATE 325 (65 FE) MG PO TABS
325.0000 mg | ORAL_TABLET | Freq: Every day | ORAL | Status: DC
  Administered 2020-08-21 – 2020-08-24 (×4): 325 mg via ORAL
  Filled 2020-08-21 (×4): qty 1

## 2020-08-21 MED ORDER — INSULIN ASPART 100 UNIT/ML IJ SOLN: 0.0000 [IU] | Freq: Three times a day (TID) | INTRAMUSCULAR | Status: DC

## 2020-08-21 MED ORDER — ASCORBIC ACID 500 MG PO TABS
500.0000 mg | ORAL_TABLET | Freq: Every day | ORAL | Status: DC
  Administered 2020-08-21 – 2020-08-24 (×4): 500 mg via ORAL
  Filled 2020-08-21 (×4): qty 1

## 2020-08-21 MED ORDER — GLUCERNA SHAKE PO LIQD
237.0000 mL | Freq: Three times a day (TID) | ORAL | Status: DC
  Administered 2020-08-21: 237 mL via ORAL

## 2020-08-21 MED ORDER — ASPIRIN 81 MG PO CHEW
81.0000 mg | CHEWABLE_TABLET | Freq: Every day | ORAL | Status: DC
  Administered 2020-08-21 – 2020-08-24 (×4): 81 mg via ORAL
  Filled 2020-08-21 (×4): qty 1

## 2020-08-21 MED ORDER — FLUTICASONE PROPIONATE 50 MCG/ACT NA SUSP
1.0000 | Freq: Every day | NASAL | Status: DC
  Administered 2020-08-21 – 2020-08-24 (×4): 1 via NASAL
  Filled 2020-08-21 (×2): qty 16

## 2020-08-21 MED ORDER — VITAMIN D 25 MCG (1000 UNIT) PO TABS
1000.0000 [IU] | ORAL_TABLET | Freq: Every day | ORAL | Status: DC
  Administered 2020-08-21 – 2020-08-24 (×4): 1000 [IU] via ORAL
  Filled 2020-08-21 (×4): qty 1

## 2020-08-21 NOTE — Progress Notes (Signed)
Pharmacy Antibiotic Note  [name] is a 70 y.o. female admitted on 08/20/2020 with AMS/UTI.  Pharmacy has been consulted for Cefepime dosing.  Plan: Cefepime 2 g IV q12h  Height: 5' (152.4 cm) Weight: 102.1 kg (225 lb) IBW/kg (Calculated) : 45.5  Temp (24hrs), Avg:101.7 F (38.7 C), Min:100.8 F (38.2 C), Max:102.6 F (39.2 C)  Recent Labs  Lab 08/20/20 2030 08/20/20 2213 08/20/20 2214  WBC 7.3  --   --   CREATININE  --  1.02*  --   LATICACIDVEN 1.3  --  1.9    Estimated Creatinine Clearance: 55.2 mL/min (A) (by C-G formula based on SCr of 1.02 mg/dL (H)).    Allergies  Allergen Reactions   Vancomycin Hives and Rash    "big red bumps, then they turned white, then I shedded like a snake."     Caryl Pina 08/21/2020 12:31 AM

## 2020-08-21 NOTE — Progress Notes (Signed)
PROGRESS NOTE    [name]  BMW:[id number] DOB: 1950/11/14 DOA: 08/20/2020 PCP: Caprice Renshaw, MD   Brief Narrative:   [name] is a 70 y.o. female with medical history significant for  prior PE with current Xarelto, type 2 diabetes, hypertension, recurrent cellulitis, prior TIA on 03/2015, prior stroke (07/24/2018) and chronic ambulatory dysfunction-wheelchair-bound who presented to the ED from [facility] who presents to the emergency department from [facility] due to altered mental status.  Patient was admitted with acute metabolic encephalopathy likely secondary to failure of outpatient treatment of UTI with IM Rocephin.  She appears to be less altered this morning.  Urine cultures are pending.  Assessment & Plan:   Principal Problem:   Acute metabolic encephalopathy Active Problems:   Type 2 diabetes mellitus (HCC)   Essential hypertension, benign   History of pulmonary embolus (PE)   Morbid obesity (HCC)   TIA (transient ischemic attack)   CVA (cerebral vascular accident) ([place])   SIRS (systemic inflammatory response syndrome) ([place])   UTI (urinary tract infection)   Hyperkalemia   Hypoalbuminemia due to protein-calorie malnutrition ([place])   Acute metabolic encephalopathy likely secondary to UTI -Appears to have failed outpatient IM Rocephin, but previously noted to have E. coli that was pansensitive -Continue IV cefepime -Urine cultures pending  Hypomagnesemia -Replete and reevaluate in a.m.  Hypoalbuminemia secondary to moderate protein calorie malnutrition -Protein supplementation  History of CVA -Continue aspirin, Xarelto, and statin  History of PE -Continue Xarelto  DM2-controlled -Sliding scale insulin -Holding metformin  Essential hypertension-controlled -Continue lisinopril  Normocytic anemia -Continue ferrous sulfate -Continue monitor CBC  Chronic ambulatory dysfunction -Back to SNF on discharge  Stage II sacral ulcers-present on  admission -Wound care as appropriate  Obesity-morbid -Lifestyle changes outpatient  DVT prophylaxis: Xarelto Code Status: DNR Family Communication: Tried calling son 7/2 Disposition Plan:  Status is: Inpatient  Remains inpatient appropriate because:Altered mental status and IV treatments appropriate due to intensity of illness or inability to take PO  Dispo: The patient is from: SNF              Anticipated d/c is to: SNF              Patient currently is not medically stable to d/c.   Difficult to place patient No   Nutritional Assessment:  The patient's BMI is: Body mass index is 44.72 kg/m.Marland Kitchen  Seen by dietician.  I agree with the assessment and plan as outlined below:  Nutrition Status:   Skin Assessment:  I have examined the patient's skin and I agree with the wound assessment as performed by the wound care RN as outlined below:  Pressure Injury 08/21/20 Buttocks Left Stage 2 -  Partial thickness loss of dermis presenting as a shallow open injury with a red, pink wound bed without slough. (Active)  08/21/20 0212  Location: Buttocks  Location Orientation: Left  Staging: Stage 2 -  Partial thickness loss of dermis presenting as a shallow open injury with a red, pink wound bed without slough.  Wound Description (Comments):   Present on Admission: Yes     Pressure Injury 08/21/20 Sacrum Medial Stage 2 -  Partial thickness loss of dermis presenting as a shallow open injury with a red, pink wound bed without slough. (Active)  08/21/20 0215  Location: Sacrum  Location Orientation: Medial  Staging: Stage 2 -  Partial thickness loss of dermis presenting as a shallow open injury with a red, pink wound bed without  slough.  Wound Description (Comments):   Present on Admission: Yes    Consultants:  None  Procedures:  See below  Antimicrobials:  Anti-infectives (From admission, onward)    Start     Dose/Rate Route Frequency Ordered Stop   08/21/20 1000  ceFEPIme  (MAXIPIME) 2 g in sodium chloride 0.9 % 100 mL IVPB        2 g 200 mL/hr over 30 Minutes Intravenous Every 12 hours 08/21/20 0033     08/20/20 2045  ceFEPIme (MAXIPIME) 2 g in sodium chloride 0.9 % 100 mL IVPB        2 g 200 mL/hr over 30 Minutes Intravenous  Once 08/20/20 2036 08/20/20 2233       Subjective: Patient seen and evaluated today with no new acute complaints or concerns. No acute concerns or events noted overnight.  Objective: Vitals:   08/20/20 2357 08/21/20 0101 08/21/20 0512 08/21/20 0823  BP:  (!) 150/52 (!) 147/70 114/86  Pulse: (!) 102 (!) 102 93   Resp: 19 20 20    Temp: (!) 100.8 F (38.2 C) 100.1 F (37.8 C) 99.2 F (37.3 C)   TempSrc:  Oral Oral   SpO2: 96% 96% 98%   Weight:  110.9 kg    Height:  5\' 2"  (1.575 m)      Intake/Output Summary (Last 24 hours) at 08/21/2020 0903 Last data filed at 08/21/2020 0848 Gross per 24 hour  Intake 4272.5 ml  Output 350 ml  Net 3922.5 ml   Filed Weights   08/20/20 2009 08/21/20 0101  Weight: 102.1 kg 110.9 kg    Examination:  General exam: Appears calm and comfortable, obese  Respiratory system: Clear to auscultation. Respiratory effort normal. Cardiovascular system: S1 & S2 heard, RRR.  Gastrointestinal system: Abdomen is soft Central nervous system: Alert and awake Extremities: No edema Skin: No significant lesions noted Psychiatry: Flat affect.    Data Reviewed: I have personally reviewed following labs and imaging studies  CBC: Recent Labs  Lab 08/20/20 2030 08/21/20 0643  WBC 7.3 5.7  NEUTROABS 6.0  --   HGB 10.8* 9.6*  HCT 36.8 32.0*  MCV 90.0 89.4  PLT 289 277   Basic Metabolic Panel: Recent Labs  Lab 08/20/20 2213 08/21/20 0643  NA 138 139  K 5.3* 4.7  CL 108 107  CO2 22 23  GLUCOSE 82 83  BUN 20 18  CREATININE 1.02* 0.88  CALCIUM 8.5* 8.5*  MG  --  1.3*  PHOS  --  3.2   GFR: Estimated Creatinine Clearance: 69.9 mL/min (by C-G formula based on SCr of 0.88 mg/dL). Liver  Function Tests: Recent Labs  Lab 08/20/20 2213 08/21/20 0643  AST 19 18  ALT 17 17  ALKPHOS 53 46  BILITOT 0.2* 0.4  PROT 6.7 5.6*  ALBUMIN 2.9* 2.4*   No results for input(s): LIPASE, AMYLASE in the last 168 hours. No results for input(s): AMMONIA in the last 168 hours. Coagulation Profile: Recent Labs  Lab 08/21/20 0643  INR 1.1   Cardiac Enzymes: No results for input(s): CKTOTAL, CKMB, CKMBINDEX, TROPONINI in the last 168 hours. BNP (last 3 results) No results for input(s): PROBNP in the last 8760 hours. HbA1C: No results for input(s): HGBA1C in the last 72 hours. CBG: Recent Labs  Lab 08/21/20 0135 08/21/20 0713  GLUCAP 87 80   Lipid Profile: No results for input(s): CHOL, HDL, LDLCALC, TRIG, CHOLHDL, LDLDIRECT in the last 72 hours. Thyroid Function Tests: No results for input(s):  TSH, T4TOTAL, FREET4, T3FREE, THYROIDAB in the last 72 hours. Anemia Panel: No results for input(s): VITAMINB12, FOLATE, FERRITIN, TIBC, IRON, RETICCTPCT in the last 72 hours. Sepsis Labs: Recent Labs  Lab 08/20/20 2030 08/20/20 2214  LATICACIDVEN 1.3 1.9    Recent Results (from the past 240 hour(s))  Blood Culture (routine x 2)     Status: None (Preliminary result)   Collection Time: 08/20/20  8:30 PM   Specimen: BLOOD LEFT ARM  Result Value Ref Range Status   Specimen Description BLOOD LEFT ARM  Final   Special Requests   Final    BOTTLES DRAWN AEROBIC AND ANAEROBIC Blood Culture adequate volume   Culture   Final    NO GROWTH < 12 HOURS Performed at [facility], [address] [place], [place] [address]    Report Status PENDING  Incomplete  Resp Panel by RT-PCR (Flu A&B, Covid) Nasopharyngeal Swab     Status: None   Collection Time: 08/20/20  8:42 PM   Specimen: Nasopharyngeal Swab; Nasopharyngeal(NP) swabs in vial transport medium  Result Value Ref Range Status   SARS Coronavirus 2 by RT PCR NEGATIVE NEGATIVE Final    Comment: (NOTE) SARS-CoV-2 target nucleic acids  are NOT DETECTED.  The SARS-CoV-2 RNA is generally detectable in upper respiratory specimens during the acute phase of infection. The lowest concentration of SARS-CoV-2 viral copies this assay can detect is 138 copies/mL. A negative result does not preclude SARS-Cov-2 infection and should not be used as the sole basis for treatment or other patient management decisions. A negative result may occur with  improper specimen collection/handling, submission of specimen other than nasopharyngeal swab, presence of viral mutation(s) within the areas targeted by this assay, and inadequate number of viral copies(<138 copies/mL). A negative result must be combined with clinical observations, patient history, and epidemiological information. The expected result is Negative.  Fact Sheet for Patients:  EntrepreneurPulse.com.au  Fact Sheet for Healthcare Providers:  IncredibleEmployment.be  This test is no t yet approved or cleared by the [place] FDA and  has been authorized for detection and/or diagnosis of SARS-CoV-2 by FDA under an Emergency Use Authorization (EUA). This EUA will remain  in effect (meaning this test can be used) for the duration of the COVID-19 declaration under Section 564(b)(1) of the Act, 21 U.S.C.section 360bbb-3(b)(1), unless the authorization is terminated  or revoked sooner.       Influenza A by PCR NEGATIVE NEGATIVE Final   Influenza B by PCR NEGATIVE NEGATIVE Final    Comment: (NOTE) The Xpert Xpress SARS-CoV-2/FLU/RSV plus assay is intended as an aid in the diagnosis of influenza from Nasopharyngeal swab specimens and should not be used as a sole basis for treatment. Nasal washings and aspirates are unacceptable for Xpert Xpress SARS-CoV-2/FLU/RSV testing.  Fact Sheet for Patients: EntrepreneurPulse.com.au  Fact Sheet for Healthcare Providers: IncredibleEmployment.be  This test is  not yet approved or cleared by the [place] FDA and has been authorized for detection and/or diagnosis of SARS-CoV-2 by FDA under an Emergency Use Authorization (EUA). This EUA will remain in effect (meaning this test can be used) for the duration of the COVID-19 declaration under Section 564(b)(1) of the Act, 21 U.S.C. section 360bbb-3(b)(1), unless the authorization is terminated or revoked.  Performed at [facility], [address], [place], [place] [address]   Blood Culture (routine x 2)     Status: None (Preliminary result)   Collection Time: 08/20/20  8:42 PM   Specimen: Right Antecubital; Blood  Result Value Ref Range Status   Specimen Description RIGHT ANTECUBITAL  Final   Special Requests   Final    BOTTLES DRAWN AEROBIC AND ANAEROBIC Blood Culture adequate volume   Culture   Final    NO GROWTH < 12 HOURS Performed at [facility], [address], [place], [place] [address]    Report Status PENDING  Incomplete         Radiology Studies: DG Chest Port 1 View  Result Date: 08/20/2020 CLINICAL DATA:  Increased confusion. EXAM: PORTABLE CHEST 1 VIEW COMPARISON:  April 09, 2015 FINDINGS: Low lung volumes are seen with mild, chronic appearing diffusely increased lung markings. There is no evidence of acute infiltrate, pleural effusion or pneumothorax. The heart size and mediastinal contours are within normal limits. There is marked severity calcification of the aortic arch. The visualized skeletal structures are unremarkable. IMPRESSION: No active cardiopulmonary disease. Electronically Signed   By: Virgina Norfolk M.D.   On: 08/20/2020 21:07        Scheduled Meds:  vitamin C  500 mg Oral Daily   aspirin  81 mg Oral Daily   atorvastatin  40 mg Oral Daily   cholecalciferol  1,000 Units Oral Daily   feeding supplement (GLUCERNA SHAKE)  237 mL Oral TID BM   ferrous sulfate  325 mg Oral Q breakfast   fluticasone  1 spray Each Nare Daily   gabapentin  100 mg Oral  TID   insulin aspart  0-9 Units Subcutaneous TID WC   lisinopril  15 mg Oral Daily   rivaroxaban  20 mg Oral Q supper   Continuous Infusions:  ceFEPime (MAXIPIME) IV 2 g (08/21/20 0833)   magnesium sulfate bolus IVPB       LOS: 1 day    Time spent: 35 minutes    Alyene Predmore Darleen Crocker, DO [facility]  If 7PM-7AM, please contact night-coverage [email or web address] 08/21/2020, 9:03 AM

## 2020-08-22 DIAGNOSIS — G9341 Metabolic encephalopathy: Secondary | ICD-10-CM | POA: Diagnosis not present

## 2020-08-22 LAB — URINE CULTURE

## 2020-08-22 LAB — GLUCOSE, CAPILLARY
Glucose-Capillary: 80 mg/dL (ref 70–99)
Glucose-Capillary: 100 mg/dL — ABNORMAL HIGH (ref 70–99)
Glucose-Capillary: 87 mg/dL (ref 70–99)
Glucose-Capillary: 142 mg/dL — ABNORMAL HIGH (ref 70–99)

## 2020-08-22 LAB — BASIC METABOLIC PANEL
Anion gap: 7 (ref 5–15)
BUN: 16 mg/dL (ref 8–23)
CO2: 23 mmol/L (ref 22–32)
Calcium: 8.4 mg/dL — ABNORMAL LOW (ref 8.9–10.3)
Chloride: 109 mmol/L (ref 98–111)
Creatinine, Ser: 1.04 mg/dL — ABNORMAL HIGH (ref 0.44–1.00)
GFR, Estimated: 58 mL/min — ABNORMAL LOW (ref >60)
Glucose, Bld: 91 mg/dL (ref 70–99)
Potassium: 4.2 mmol/L (ref 3.5–5.1)
Sodium: 139 mmol/L (ref 135–145)

## 2020-08-22 LAB — MAGNESIUM: Magnesium: 1.5 mg/dL — ABNORMAL LOW (ref 1.7–2.4)

## 2020-08-22 LAB — CBC
HCT: 30.5 % — ABNORMAL LOW (ref 36.0–46.0)
Hemoglobin: 9 g/dL — ABNORMAL LOW (ref 12.0–15.0)
MCH: 26.5 pg (ref 26.0–34.0)
MCHC: 29.5 g/dL — ABNORMAL LOW (ref 30.0–36.0)
MCV: 89.7 fL (ref 80.0–100.0)
Platelets: 219 10*3/uL (ref 150–400)
RBC: 3.4 MIL/uL — ABNORMAL LOW (ref 3.87–5.11)
RDW: 16.7 % — ABNORMAL HIGH (ref 11.5–15.5)
WBC: 5 10*3/uL (ref 4.0–10.5)
nRBC: 0 % (ref 0.0–0.2)

## 2020-08-22 MED ORDER — MAGNESIUM SULFATE 2 GM/50ML IV SOLN
2.0000 g | Freq: Once | INTRAVENOUS | Status: AC
  Administered 2020-08-22: 2 g via INTRAVENOUS
  Filled 2020-08-22: qty 50

## 2020-08-22 NOTE — Progress Notes (Signed)
PROGRESS NOTE    CALLYN SEVERTSON  EYC:[id number] DOB: 1950/10/22 DOA: 08/20/2020 PCP: Caprice Renshaw, MD   Brief Narrative:   Ila Landowski Spinnato is a 70 y.o. female with medical history significant for  prior PE with current Xarelto, type 2 diabetes, hypertension, recurrent cellulitis, prior TIA on 03/2015, prior stroke (07/24/2018) and chronic ambulatory dysfunction-wheelchair-bound who presented to the ED from [facility] who presents to the emergency department from [facility] due to altered mental status.  Patient was admitted with acute metabolic encephalopathy likely secondary to failure of outpatient treatment of UTI with IM Rocephin.  She appears to be less altered this morning.  Urine cultures are pending.  Assessment & Plan:   Principal Problem:   Acute metabolic encephalopathy Active Problems:   Type 2 diabetes mellitus (HCC)   Essential hypertension, benign   History of pulmonary embolus (PE)   Morbid obesity (HCC)   TIA (transient ischemic attack)   CVA (cerebral vascular accident) ([place])   SIRS (systemic inflammatory response syndrome) ([place])   UTI (urinary tract infection)   Hyperkalemia   Hypoalbuminemia due to protein-calorie malnutrition ([place])   Acute metabolic encephalopathy likely secondary to UTI-improving -Appears to have failed outpatient IM Rocephin, but previously noted to have E. coli that was pansensitive -Continue IV cefepime -Urine cultures pending   Hypomagnesemia -Replete and reevaluate in a.m.   Hypoalbuminemia secondary to moderate protein calorie malnutrition -Protein supplementation   History of CVA -Continue aspirin, Xarelto, and statin   History of PE -Continue Xarelto   DM2-controlled -Sliding scale insulin -Holding metformin   Essential hypertension-controlled -Continue lisinopril   Normocytic anemia -Continue ferrous sulfate -Continue monitor CBC   Chronic ambulatory dysfunction -Back to SNF on discharge   Stage II sacral  ulcers-present on admission -Wound care as appropriate   Obesity-morbid -Lifestyle changes outpatient   DVT prophylaxis: Xarelto Code Status: DNR Family Communication: Called son 7/2 Disposition Plan:  Status is: Inpatient   Remains inpatient appropriate because:Altered mental status and IV treatments appropriate due to intensity of illness or inability to take PO   Dispo: The patient is from: SNF              Anticipated d/c is to: SNF              Patient currently is not medically stable to d/c.              Difficult to place patient No     Nutritional Assessment:   The patient's BMI is: Body mass index is 44.72 kg/m.Marland Kitchen   Seen by dietician.  I agree with the assessment and plan as outlined below:   Nutrition Status: Skin Assessment:   I have examined the patient's skin and I agree with the wound assessment as performed by the wound care RN as outlined below:   Pressure Injury 08/21/20 Buttocks Left Stage 2 -  Partial thickness loss of dermis presenting as a shallow open injury with a red, pink wound bed without slough. (Active)  08/21/20 0212  Location: Buttocks  Location Orientation: Left  Staging: Stage 2 -  Partial thickness loss of dermis presenting as a shallow open injury with a red, pink wound bed without slough.  Wound Description (Comments):  Present on Admission: Yes     Pressure Injury 08/21/20 Sacrum Medial Stage 2 -  Partial thickness loss of dermis presenting as a shallow open injury with a red, pink wound bed without slough. (Active)  08/21/20 0215  Location: Sacrum  Location Orientation: Medial  Staging: Stage 2 -  Partial thickness loss of dermis presenting as a shallow open injury with a red, pink wound bed without slough.  Wound Description (Comments):  Present on Admission: Yes      Consultants:  None   Procedures:  See below   Antimicrobials:  Anti-infectives (From admission, onward)    Start     Dose/Rate Route Frequency Ordered Stop    08/21/20 1000  ceFEPIme (MAXIPIME) 2 g in sodium chloride 0.9 % 100 mL IVPB        2 g 200 mL/hr over 30 Minutes Intravenous Every 12 hours 08/21/20 0033     08/20/20 2045  ceFEPIme (MAXIPIME) 2 g in sodium chloride 0.9 % 100 mL IVPB        2 g 200 mL/hr over 30 Minutes Intravenous  Once 08/20/20 2036 08/20/20 2233       Subjective: Patient seen and evaluated today with no new acute complaints or concerns. No acute concerns or events noted overnight.  Objective: Vitals:   08/21/20 1418 08/21/20 2037 08/22/20 0410 08/22/20 0830  BP: 120/62 135/66 (!) 156/71 (!) 123/54  Pulse: 94 85 83   Resp: 20 18 18    Temp: (!) 97.5 F (36.4 C) 98.9 F (37.2 C) 98.6 F (37 C)   TempSrc: Oral Oral    SpO2: 100% 97% 99%   Weight:      Height:        Intake/Output Summary (Last 24 hours) at 08/22/2020 0959 Last data filed at 08/21/2020 1846 Gross per 24 hour  Intake 390 ml  Output 200 ml  Net 190 ml   Filed Weights   08/20/20 2009 08/21/20 0101  Weight: 102.1 kg 110.9 kg    Examination:  General exam: Appears calm and comfortable,obese Respiratory system: Clear to auscultation. Respiratory effort normal. Cardiovascular system: S1 & S2 heard, RRR.  Gastrointestinal system: Abdomen is soft Central nervous system: Somnolent Extremities: No edema Skin: No significant lesions noted Psychiatry: Flat affect.    Data Reviewed: I have personally reviewed following labs and imaging studies  CBC: Recent Labs  Lab 08/20/20 2030 08/21/20 0643 08/22/20 0407  WBC 7.3 5.7 5.0  NEUTROABS 6.0  --   --   HGB 10.8* 9.6* 9.0*  HCT 36.8 32.0* 30.5*  MCV 90.0 89.4 89.7  PLT 289 221 275   Basic Metabolic Panel: Recent Labs  Lab 08/20/20 2213 08/21/20 0643 08/22/20 0407  NA 138 139 139  K 5.3* 4.7 4.2  CL 108 107 109  CO2 22 23 23   GLUCOSE 82 83 91  BUN 20 18 16   CREATININE 1.02* 0.88 1.04*  CALCIUM 8.5* 8.5* 8.4*  MG  --  1.3* 1.5*  PHOS  --  3.2  --    GFR: Estimated  Creatinine Clearance: 59.1 mL/min (A) (by C-G formula based on SCr of 1.04 mg/dL (H)). Liver Function Tests: Recent Labs  Lab 08/20/20 2213 08/21/20 0643  AST 19 18  ALT 17 17  ALKPHOS 53 46  BILITOT 0.2* 0.4  PROT 6.7 5.6*  ALBUMIN 2.9* 2.4*   No results for input(s): LIPASE, AMYLASE in the last 168 hours. No results for input(s): AMMONIA in the last 168 hours. Coagulation Profile: Recent Labs  Lab 08/21/20 0643  INR 1.1   Cardiac Enzymes: No results for input(s): CKTOTAL, CKMB, CKMBINDEX, TROPONINI in the last 168 hours. BNP (last 3 results) No results for input(s): PROBNP in the last 8760 hours. HbA1C: No results for  input(s): HGBA1C in the last 72 hours. CBG: Recent Labs  Lab 08/21/20 0713 08/21/20 1111 08/21/20 1608 08/21/20 2034 08/22/20 0823  GLUCAP 80 86 118* 108* 80   Lipid Profile: No results for input(s): CHOL, HDL, LDLCALC, TRIG, CHOLHDL, LDLDIRECT in the last 72 hours. Thyroid Function Tests: No results for input(s): TSH, T4TOTAL, FREET4, T3FREE, THYROIDAB in the last 72 hours. Anemia Panel: No results for input(s): VITAMINB12, FOLATE, FERRITIN, TIBC, IRON, RETICCTPCT in the last 72 hours. Sepsis Labs: Recent Labs  Lab 08/20/20 2030 08/20/20 2214  LATICACIDVEN 1.3 1.9    Recent Results (from the past 240 hour(s))  Blood Culture (routine x 2)     Status: None (Preliminary result)   Collection Time: 08/20/20  8:30 PM   Specimen: BLOOD LEFT ARM  Result Value Ref Range Status   Specimen Description BLOOD LEFT ARM  Final   Special Requests   Final    BOTTLES DRAWN AEROBIC AND ANAEROBIC Blood Culture adequate volume   Culture   Final    NO GROWTH < 12 HOURS Performed at [facility], [address], [place],  [address]    Report Status PENDING  Incomplete  Resp Panel by RT-PCR (Flu A&B, Covid) Nasopharyngeal Swab     Status: None   Collection Time: 08/20/20  8:42 PM   Specimen: Nasopharyngeal Swab; Nasopharyngeal(NP) swabs in vial  transport medium  Result Value Ref Range Status   SARS Coronavirus 2 by RT PCR NEGATIVE NEGATIVE Final    Comment: (NOTE) SARS-CoV-2 target nucleic acids are NOT DETECTED.  The SARS-CoV-2 RNA is generally detectable in upper respiratory specimens during the acute phase of infection. The lowest concentration of SARS-CoV-2 viral copies this assay can detect is 138 copies/mL. A negative result does not preclude SARS-Cov-2 infection and should not be used as the sole basis for treatment or other patient management decisions. A negative result may occur with  improper specimen collection/handling, submission of specimen other than nasopharyngeal swab, presence of viral mutation(s) within the areas targeted by this assay, and inadequate number of viral copies(<138 copies/mL). A negative result must be combined with clinical observations, patient history, and epidemiological information. The expected result is Negative.  Fact Sheet for Patients:  EntrepreneurPulse.com.au  Fact Sheet for Healthcare Providers:  IncredibleEmployment.be  This test is no t yet approved or cleared by the [place] FDA and  has been authorized for detection and/or diagnosis of SARS-CoV-2 by FDA under an Emergency Use Authorization (EUA). This EUA will remain  in effect (meaning this test can be used) for the duration of the COVID-19 declaration under Section 564(b)(1) of the Act, 21 U.S.C.section 360bbb-3(b)(1), unless the authorization is terminated  or revoked sooner.       Influenza A by PCR NEGATIVE NEGATIVE Final   Influenza B by PCR NEGATIVE NEGATIVE Final    Comment: (NOTE) The Xpert Xpress SARS-CoV-2/FLU/RSV plus assay is intended as an aid in the diagnosis of influenza from Nasopharyngeal swab specimens and should not be used as a sole basis for treatment. Nasal washings and aspirates are unacceptable for Xpert Xpress SARS-CoV-2/FLU/RSV testing.  Fact  Sheet for Patients: EntrepreneurPulse.com.au  Fact Sheet for Healthcare Providers: IncredibleEmployment.be  This test is not yet approved or cleared by the [place] FDA and has been authorized for detection and/or diagnosis of SARS-CoV-2 by FDA under an Emergency Use Authorization (EUA). This EUA will remain in effect (meaning this test can be used) for the duration of the COVID-19 declaration under Section 564(b)(1) of  the Act, 21 U.S.C. section 360bbb-3(b)(1), unless the authorization is terminated or revoked.  Performed at [facility], [address], [place], [place] [address]   Blood Culture (routine x 2)     Status: None (Preliminary result)   Collection Time: 08/20/20  8:42 PM   Specimen: Right Antecubital; Blood  Result Value Ref Range Status   Specimen Description RIGHT ANTECUBITAL  Final   Special Requests   Final    BOTTLES DRAWN AEROBIC AND ANAEROBIC Blood Culture adequate volume   Culture   Final    NO GROWTH < 12 HOURS Performed at [facility], [address] [place], [place] [address]    Report Status PENDING  Incomplete  MRSA Next Gen by PCR, Nasal     Status: None   Collection Time: 08/21/20  5:18 AM   Specimen: Nasal Mucosa; Nasal Swab  Result Value Ref Range Status   MRSA by PCR Next Gen NOT DETECTED NOT DETECTED Final    Comment: (NOTE) The GeneXpert MRSA Assay (FDA approved for NASAL specimens only), is one component of a comprehensive MRSA colonization surveillance program. It is not intended to diagnose MRSA infection nor to guide or monitor treatment for MRSA infections. Test performance is not FDA approved in patients less than 60 years old. Performed at [facility], [address], [place], [place] [address]          Radiology Studies: [facility] Chest [facility] 1 View  Result Date: 08/20/2020 CLINICAL DATA:  Increased confusion. EXAM: PORTABLE CHEST 1 VIEW COMPARISON:  April 09, 2015 FINDINGS: Low lung volumes  are seen with mild, chronic appearing diffusely increased lung markings. There is no evidence of acute infiltrate, pleural effusion or pneumothorax. The heart size and mediastinal contours are within normal limits. There is marked severity calcification of the aortic arch. The visualized skeletal structures are unremarkable. IMPRESSION: No active cardiopulmonary disease. Electronically Signed   By: Virgina Norfolk M.D.   On: 08/20/2020 21:07        Scheduled Meds:  vitamin C  500 mg Oral Daily   aspirin  81 mg Oral Daily   atorvastatin  40 mg Oral Daily   cholecalciferol  1,000 Units Oral Daily   feeding supplement (GLUCERNA SHAKE)  237 mL Oral TID BM   ferrous sulfate  325 mg Oral Q breakfast   fluticasone  1 spray Each Nare Daily   gabapentin  100 mg Oral TID   insulin aspart  0-9 Units Subcutaneous TID WC   lisinopril  15 mg Oral Daily   rivaroxaban  20 mg Oral Q supper   Continuous Infusions:  ceFEPime (MAXIPIME) IV 2 g (08/21/20 2020)     LOS: 2 days    Time spent: 35 minutes    Arlynn Stare Darleen Crocker, DO [facility]  If 7PM-7AM, please contact night-coverage [email or web address] 08/22/2020, 9:59 AM

## 2020-08-22 NOTE — Progress Notes (Signed)
Patient has had large loose stools x4 this shift. Adefeso MD notified. No new orders.

## 2020-08-23 DIAGNOSIS — G9341 Metabolic encephalopathy: Secondary | ICD-10-CM | POA: Diagnosis not present

## 2020-08-23 LAB — BASIC METABOLIC PANEL
Anion gap: 6 (ref 5–15)
BUN: 15 mg/dL (ref 8–23)
CO2: 25 mmol/L (ref 22–32)
Calcium: 8.2 mg/dL — ABNORMAL LOW (ref 8.9–10.3)
Chloride: 108 mmol/L (ref 98–111)
Creatinine, Ser: 0.98 mg/dL (ref 0.44–1.00)
GFR, Estimated: >60 mL/min (ref >60)
Glucose, Bld: 91 mg/dL (ref 70–99)
Potassium: 4.2 mmol/L (ref 3.5–5.1)
Sodium: 139 mmol/L (ref 135–145)

## 2020-08-23 LAB — MAGNESIUM: Magnesium: 1.8 mg/dL (ref 1.7–2.4)

## 2020-08-23 LAB — CBC
HCT: 31.7 % — ABNORMAL LOW (ref 36.0–46.0)
Hemoglobin: 9.2 g/dL — ABNORMAL LOW (ref 12.0–15.0)
MCH: 26.1 pg (ref 26.0–34.0)
MCHC: 29 g/dL — ABNORMAL LOW (ref 30.0–36.0)
MCV: 90.1 fL (ref 80.0–100.0)
Platelets: 217 10*3/uL (ref 150–400)
RBC: 3.52 MIL/uL — ABNORMAL LOW (ref 3.87–5.11)
RDW: 16.4 % — ABNORMAL HIGH (ref 11.5–15.5)
WBC: 5.7 10*3/uL (ref 4.0–10.5)
nRBC: 0 % (ref 0.0–0.2)

## 2020-08-23 LAB — GLUCOSE, CAPILLARY
Glucose-Capillary: 85 mg/dL (ref 70–99)
Glucose-Capillary: 94 mg/dL (ref 70–99)
Glucose-Capillary: 88 mg/dL (ref 70–99)
Glucose-Capillary: 145 mg/dL — ABNORMAL HIGH (ref 70–99)

## 2020-08-23 MED ORDER — SODIUM CHLORIDE 0.9 % IV SOLN
2.0000 g | Freq: Three times a day (TID) | INTRAVENOUS | Status: DC
  Administered 2020-08-23 – 2020-08-24 (×2): 2 g via INTRAVENOUS
  Filled 2020-08-23 (×2): qty 2

## 2020-08-23 MED ORDER — MIRTAZAPINE 15 MG PO TABS
7.5000 mg | ORAL_TABLET | Freq: Every day | ORAL | Status: DC
  Administered 2020-08-23: 7.5 mg via ORAL
  Filled 2020-08-23: qty 1

## 2020-08-23 NOTE — Progress Notes (Signed)
PROGRESS NOTE    [name]  CWC:[id number] DOB: [birth year] DOA: 08/20/2020 PCP: Caprice Renshaw, MD   Brief Narrative:   [name] is a 70 y.o. female with medical history significant for  prior PE with current Xarelto, type 2 diabetes, hypertension, recurrent cellulitis, prior TIA on 03/2015, prior stroke (07/24/2018) and chronic ambulatory dysfunction-wheelchair-bound who presented to the ED from [facility] who presents to the emergency department from [facility] health due to altered mental status.  Patient was admitted with acute metabolic encephalopathy likely secondary to failure of outpatient treatment of UTI with IM Rocephin.  She appears to be less altered this morning. Her Ucx shows multiple species.  Assessment & Plan:   Principal Problem:   Acute metabolic encephalopathy Active Problems:   Type 2 diabetes mellitus (HCC)   Essential hypertension, benign   History of pulmonary embolus (PE)   Morbid obesity (HCC)   TIA (transient ischemic attack)   CVA (cerebral vascular accident) ([place])   SIRS (systemic inflammatory response syndrome) (HCC)   UTI (urinary tract infection)   Hyperkalemia   Hypoalbuminemia due to protein-calorie malnutrition (HCC)   Acute metabolic encephalopathy likely secondary to UTI-improving -Appears to have failed outpatient IM Rocephin, but previously noted to have E. coli that was pansensitive -Continue IV cefepime for one more day -Urine cultures with mult species   Hypoalbuminemia secondary to moderate protein calorie malnutrition -Protein supplementation   History of CVA -Continue aspirin, Xarelto, and statin   History of PE -Continue Xarelto   DM2-controlled -Sliding scale insulin -Holding metformin   Essential hypertension-controlled -Continue lisinopril   Normocytic anemia -Continue ferrous sulfate -Continue monitor CBC   Chronic ambulatory dysfunction -Back to SNF on discharge   Stage II sacral ulcers-present on  admission -Wound care as appropriate  Anorexia likely related to depression -Loss of husband approximately 4-6 months ago -Trial of mirtazapine   Obesity-morbid -Lifestyle changes outpatient   DVT prophylaxis: Xarelto Code Status: DNR Family Communication: Called son 7/4 Disposition Plan:  Status is: Inpatient   Remains inpatient appropriate because:Altered mental status and IV treatments appropriate due to intensity of illness or inability to take PO   Dispo: The patient is from: SNF              Anticipated d/c is to: SNF              Patient currently is not medically stable to d/c.              Difficult to place patient No     Nutritional Assessment:   The patient's BMI is: Body mass index is 44.72 kg/m.Marland Kitchen   Seen by dietician.  I agree with the assessment and plan as outlined below:   Nutrition Status: Skin Assessment:   I have examined the patient's skin and I agree with the wound assessment as performed by the wound care RN as outlined below:   Pressure Injury 08/21/20 Buttocks Left Stage 2 -  Partial thickness loss of dermis presenting as a shallow open injury with a red, pink wound bed without slough. (Active)  08/21/20 0212  Location: Buttocks  Location Orientation: Left  Staging: Stage 2 -  Partial thickness loss of dermis presenting as a shallow open injury with a red, pink wound bed without slough.  Wound Description (Comments):  Present on Admission: Yes     Pressure Injury 08/21/20 Sacrum Medial Stage 2 -  Partial thickness loss of dermis presenting as a shallow open injury with a  red, pink wound bed without slough. (Active)  08/21/20 0215  Location: Sacrum  Location Orientation: Medial  Staging: Stage 2 -  Partial thickness loss of dermis presenting as a shallow open injury with a red, pink wound bed without slough.  Wound Description (Comments):  Present on Admission: Yes      Consultants:  None   Procedures:  See below  Antimicrobials:   Anti-infectives (From admission, onward)    Start     Dose/Rate Route Frequency Ordered Stop   08/23/20 2200  ceFEPIme (MAXIPIME) 2 g in sodium chloride 0.9 % 100 mL IVPB        2 g 200 mL/hr over 30 Minutes Intravenous Every 8 hours 08/23/20 1036     08/21/20 1000  ceFEPIme (MAXIPIME) 2 g in sodium chloride 0.9 % 100 mL IVPB  Status:  Discontinued        2 g 200 mL/hr over 30 Minutes Intravenous Every 12 hours 08/21/20 0033 08/23/20 1036   08/20/20 2045  ceFEPIme (MAXIPIME) 2 g in sodium chloride 0.9 % 100 mL IVPB        2 g 200 mL/hr over 30 Minutes Intravenous  Once 08/20/20 2036 08/20/20 2233       Subjective: Patient seen and evaluated today with no new acute complaints or concerns. No acute concerns or events noted overnight. She claims to have some ongoing diarrhea, but has not had any further episodes today. She states that she has lost 60 pounds over the last few months and this is because she has no appetite for food.  Her son states that much of this started after her husband passed several months ago.  Objective: Vitals:   08/22/20 1425 08/22/20 2236 08/23/20 0500 08/23/20 1347  BP: 123/61 135/60 122/62 (!) 127/43  Pulse: 80 80 76 75  Resp: 18 18 18 18   Temp: 98.7 F (37.1 C) 98.8 F (37.1 C) 98.4 F (36.9 C) 98.4 F (36.9 C)  TempSrc: Oral Oral Oral   SpO2: 99% 97% 98% 100%  Weight:      Height:        Intake/Output Summary (Last 24 hours) at 08/23/2020 1437 Last data filed at 08/23/2020 1347 Gross per 24 hour  Intake 1670 ml  Output 850 ml  Net 820 ml   Filed Weights   08/20/20 2009 08/21/20 0101  Weight: 102.1 kg 110.9 kg    Examination:  General exam: Appears calm and comfortable, morbidly obese Respiratory system: Clear to auscultation. Respiratory effort normal. Cardiovascular system: S1 & S2 heard, RRR.  Gastrointestinal system: Abdomen is soft Central nervous system: Alert and awake Extremities: No edema Skin: No significant lesions  noted Psychiatry: Flat affect.    Data Reviewed: I have personally reviewed following labs and imaging studies  CBC: Recent Labs  Lab 08/20/20 2030 08/21/20 0643 08/22/20 0407 08/23/20 0606  WBC 7.3 5.7 5.0 5.7  NEUTROABS 6.0  --   --   --   HGB 10.8* 9.6* 9.0* 9.2*  HCT 36.8 32.0* 30.5* 31.7*  MCV 90.0 89.4 89.7 90.1  PLT 289 221 219 161   Basic Metabolic Panel: Recent Labs  Lab 08/20/20 2213 08/21/20 0643 08/22/20 0407 08/23/20 0606  NA 138 139 139 139  K 5.3* 4.7 4.2 4.2  CL 108 107 109 108  CO2 22 23 23 25   GLUCOSE 82 83 91 91  BUN 20 18 16 15   CREATININE 1.02* 0.88 1.04* 0.98  CALCIUM 8.5* 8.5* 8.4* 8.2*  MG  --  1.3* 1.5* 1.8  PHOS  --  3.2  --   --    GFR: Estimated Creatinine Clearance: 62.7 mL/min (by C-G formula based on SCr of 0.98 mg/dL). Liver Function Tests: Recent Labs  Lab 08/20/20 2213 08/21/20 0643  AST 19 18  ALT 17 17  ALKPHOS 53 46  BILITOT 0.2* 0.4  PROT 6.7 5.6*  ALBUMIN 2.9* 2.4*   No results for input(s): LIPASE, AMYLASE in the last 168 hours. No results for input(s): AMMONIA in the last 168 hours. Coagulation Profile: Recent Labs  Lab 08/21/20 0643  INR 1.1   Cardiac Enzymes: No results for input(s): CKTOTAL, CKMB, CKMBINDEX, TROPONINI in the last 168 hours. BNP (last 3 results) No results for input(s): PROBNP in the last 8760 hours. HbA1C: No results for input(s): HGBA1C in the last 72 hours. CBG: Recent Labs  Lab 08/22/20 1103 08/22/20 1614 08/22/20 2142 08/23/20 0742 08/23/20 1122  GLUCAP 100* 87 142* 85 94   Lipid Profile: No results for input(s): CHOL, HDL, LDLCALC, TRIG, CHOLHDL, LDLDIRECT in the last 72 hours. Thyroid Function Tests: No results for input(s): TSH, T4TOTAL, FREET4, T3FREE, THYROIDAB in the last 72 hours. Anemia Panel: No results for input(s): VITAMINB12, FOLATE, FERRITIN, TIBC, IRON, RETICCTPCT in the last 72 hours. Sepsis Labs: Recent Labs  Lab 08/20/20 2030 08/20/20 2214   LATICACIDVEN 1.3 1.9    Recent Results (from the past 240 hour(s))  Blood Culture (routine x 2)     Status: None (Preliminary result)   Collection Time: 08/20/20  8:30 PM   Specimen: BLOOD LEFT ARM  Result Value Ref Range Status   Specimen Description BLOOD LEFT ARM  Final   Special Requests   Final    BOTTLES DRAWN AEROBIC AND ANAEROBIC Blood Culture adequate volume   Culture   Final    NO GROWTH 2 DAYS Performed at [facility], [address] [place], [place] [address]    Report Status PENDING  Incomplete  Resp Panel by RT-PCR (Flu A&B, Covid) Nasopharyngeal Swab     Status: None   Collection Time: 08/20/20  8:42 PM   Specimen: Nasopharyngeal Swab; Nasopharyngeal(NP) swabs in vial transport medium  Result Value Ref Range Status   SARS Coronavirus 2 by RT PCR NEGATIVE NEGATIVE Final    Comment: (NOTE) SARS-CoV-2 target nucleic acids are NOT DETECTED.  The SARS-CoV-2 RNA is generally detectable in upper respiratory specimens during the acute phase of infection. The lowest concentration of SARS-CoV-2 viral copies this assay can detect is 138 copies/mL. A negative result does not preclude SARS-Cov-2 infection and should not be used as the sole basis for treatment or other patient management decisions. A negative result may occur with  improper specimen collection/handling, submission of specimen other than nasopharyngeal swab, presence of viral mutation(s) within the areas targeted by this assay, and inadequate number of viral copies(<138 copies/mL). A negative result must be combined with clinical observations, patient history, and epidemiological information. The expected result is Negative.  Fact Sheet for Patients:  EntrepreneurPulse.com.au  Fact Sheet for Healthcare Providers:  IncredibleEmployment.be  This test is no t yet approved or cleared by the [place] FDA and  has been authorized for detection and/or diagnosis of  SARS-CoV-2 by FDA under an Emergency Use Authorization (EUA). This EUA will remain  in effect (meaning this test can be used) for the duration of the COVID-19 declaration under Section 564(b)(1) of the Act, 21 U.S.C.section 360bbb-3(b)(1), unless the authorization is terminated  or revoked sooner.  Influenza A by PCR NEGATIVE NEGATIVE Final   Influenza B by PCR NEGATIVE NEGATIVE Final    Comment: (NOTE) The Xpert Xpress SARS-CoV-2/FLU/RSV plus assay is intended as an aid in the diagnosis of influenza from Nasopharyngeal swab specimens and should not be used as a sole basis for treatment. Nasal washings and aspirates are unacceptable for Xpert Xpress SARS-CoV-2/FLU/RSV testing.  Fact Sheet for Patients: EntrepreneurPulse.com.au  Fact Sheet for Healthcare Providers: IncredibleEmployment.be  This test is not yet approved or cleared by the [place] FDA and has been authorized for detection and/or diagnosis of SARS-CoV-2 by FDA under an Emergency Use Authorization (EUA). This EUA will remain in effect (meaning this test can be used) for the duration of the COVID-19 declaration under Section 564(b)(1) of the Act, 21 U.S.C. section 360bbb-3(b)(1), unless the authorization is terminated or revoked.  Performed at [facility], [address] [place], [place] [address]   Blood Culture (routine x 2)     Status: None (Preliminary result)   Collection Time: 08/20/20  8:42 PM   Specimen: Right Antecubital; Blood  Result Value Ref Range Status   Specimen Description RIGHT ANTECUBITAL  Final   Special Requests   Final    BOTTLES DRAWN AEROBIC AND ANAEROBIC Blood Culture adequate volume   Culture   Final    NO GROWTH 2 DAYS Performed at [facility], [address], [place], [place] [address]    Report Status PENDING  Incomplete  Urine culture     Status: Abnormal   Collection Time: 08/20/20 10:31 PM   Specimen: Urine, Clean Catch  Result  Value Ref Range Status   Specimen Description   Final    URINE, CLEAN CATCH Performed at [facility], [address] [place], [place] [address]    Special Requests   Final    NONE Performed at [facility], [address] [place], [place] [address]    Culture MULTIPLE SPECIES PRESENT, SUGGEST RECOLLECTION (A)  Final   Report Status 08/22/2020 FINAL  Final  MRSA Next Gen by PCR, Nasal     Status: None   Collection Time: 08/21/20  5:18 AM   Specimen: Nasal Mucosa; Nasal Swab  Result Value Ref Range Status   MRSA by PCR Next Gen NOT DETECTED NOT DETECTED Final    Comment: (NOTE) The GeneXpert MRSA Assay (FDA approved for NASAL specimens only), is one component of a comprehensive MRSA colonization surveillance program. It is not intended to diagnose MRSA infection nor to guide or monitor treatment for MRSA infections. Test performance is not FDA approved in patients less than 33 years old. Performed at [facility], [address] [place], [place] [address]          Radiology Studies: No results found.      Scheduled Meds:  vitamin C  500 mg Oral Daily   aspirin  81 mg Oral Daily   atorvastatin  40 mg Oral Daily   cholecalciferol  1,000 Units Oral Daily   feeding supplement (GLUCERNA SHAKE)  237 mL Oral TID BM   ferrous sulfate  325 mg Oral Q breakfast   fluticasone  1 spray Each Nare Daily   gabapentin  100 mg Oral TID   insulin aspart  0-9 Units Subcutaneous TID WC   lisinopril  15 mg Oral Daily   mirtazapine  7.5 mg Oral QHS   rivaroxaban  20 mg Oral Q supper   Continuous Infusions:  ceFEPime (MAXIPIME) IV       LOS:  3 days    Time spent: 35 minutes    Zakir Henner Darleen Crocker, DO [facility]  If 7PM-7AM, please contact night-coverage [email or web address] 08/23/2020, 2:37 PM

## 2020-08-23 NOTE — Progress Notes (Signed)
PT Cancellation Note  Patient Details Name: [name] MRN: [id number] DOB: [birth year]   Cancelled Treatment:    Reason Eval/Treat Not Completed: PT screened, no needs identified, will sign off; Patient states bed/ wheelchair bound at baseline and she is assisted with all tranfers and ADL. Patient is currently at baseline level of function.    9:51 AM, 08/23/20 Mearl Latin PT, DPT Physical Therapist at [facility]

## 2020-08-23 NOTE — Progress Notes (Signed)
Pharmacy Antibiotic Note  [name] is a 70 y.o. female admitted on 08/20/2020 with AMS/UTI.  Pharmacy has been consulted for Cefepime dosing.  Plan: Cefepime 2 g IV q8h  Height: 5\' 2"  (157.5 cm) Weight: 110.9 kg (244 lb 7.8 oz) IBW/kg (Calculated) : 50.1  Temp (24hrs), Avg:98.6 F (37 C), Min:98.4 F (36.9 C), Max:98.8 F (37.1 C)  Recent Labs  Lab 08/20/20 2030 08/20/20 2213 08/20/20 2214 08/21/20 0643 08/22/20 0407 08/23/20 0606  WBC 7.3  --   --  5.7 5.0 5.7  CREATININE  --  1.02*  --  0.88 1.04* 0.98  LATICACIDVEN 1.3  --  1.9  --   --   --      Estimated Creatinine Clearance: 62.7 mL/min (by C-G formula based on SCr of 0.98 mg/dL).    Allergies  Allergen Reactions   Vancomycin Hives and Rash    "big red bumps, then they turned white, then I shedded like a snake."     [name] [name] 08/23/2020 10:35 AM

## 2020-08-23 NOTE — Progress Notes (Signed)
OT Cancellation Note  Patient Details Name: [name] MRN: [id number] DOB: 17-Mar-1950   Cancelled Treatment:    Reason Eval/Treat Not Completed: OT screened, no needs identified, will sign off. Pt is LTC resident at [facility], wheelchair bound, uses hoyer lift for transfers. LTC staff assist with all ADLs. Pt reports she is at her baseline functioning. No further OT services required at this time.    Guadelupe Sabin, OTR/L  [phone] 08/23/2020, 9:54 AM

## 2020-08-24 ENCOUNTER — Inpatient Hospital Stay (HOSPITAL_COMMUNITY)
Admission: EM | Admit: 2020-08-24 | Discharge: 2020-08-28 | Disposition: A | Discharge status: Skilled Nursing Facility | Payer: Medicare (Managed Care) | Source: Skilled Nursing Facility | Attending: Family Medicine | Admitting: Family Medicine

## 2020-08-24 ENCOUNTER — Emergency Department (HOSPITAL_COMMUNITY): Payer: Medicare (Managed Care)

## 2020-08-24 DIAGNOSIS — Z8614 Personal history of Methicillin resistant Staphylococcus aureus infection: Secondary | ICD-10-CM

## 2020-08-24 DIAGNOSIS — Z87440 Personal history of urinary (tract) infections: Secondary | ICD-10-CM

## 2020-08-24 DIAGNOSIS — Z7982 Long term (current) use of aspirin: Secondary | ICD-10-CM

## 2020-08-24 DIAGNOSIS — F32A Depression, unspecified: Secondary | ICD-10-CM | POA: Diagnosis present

## 2020-08-24 DIAGNOSIS — I10 Essential (primary) hypertension: Secondary | ICD-10-CM | POA: Diagnosis present

## 2020-08-24 DIAGNOSIS — E119 Type 2 diabetes mellitus without complications: Secondary | ICD-10-CM | POA: Diagnosis not present

## 2020-08-24 DIAGNOSIS — G9340 Encephalopathy, unspecified: Principal | ICD-10-CM

## 2020-08-24 DIAGNOSIS — Z6841 Body Mass Index (BMI) 40.0 and over, adult: Secondary | ICD-10-CM

## 2020-08-24 DIAGNOSIS — R269 Unspecified abnormalities of gait and mobility: Secondary | ICD-10-CM | POA: Diagnosis present

## 2020-08-24 DIAGNOSIS — Z7984 Long term (current) use of oral hypoglycemic drugs: Secondary | ICD-10-CM

## 2020-08-24 DIAGNOSIS — E11649 Type 2 diabetes mellitus with hypoglycemia without coma: Secondary | ICD-10-CM | POA: Diagnosis present

## 2020-08-24 DIAGNOSIS — L89322 Pressure ulcer of left buttock, stage 2: Secondary | ICD-10-CM | POA: Diagnosis present

## 2020-08-24 DIAGNOSIS — Z833 Family history of diabetes mellitus: Secondary | ICD-10-CM

## 2020-08-24 DIAGNOSIS — Z7401 Bed confinement status: Secondary | ICD-10-CM

## 2020-08-24 DIAGNOSIS — E1142 Type 2 diabetes mellitus with diabetic polyneuropathy: Secondary | ICD-10-CM | POA: Diagnosis present

## 2020-08-24 DIAGNOSIS — Z8249 Family history of ischemic heart disease and other diseases of the circulatory system: Secondary | ICD-10-CM

## 2020-08-24 DIAGNOSIS — Z20822 Contact with and (suspected) exposure to covid-19: Secondary | ICD-10-CM | POA: Diagnosis present

## 2020-08-24 DIAGNOSIS — R55 Syncope and collapse: Secondary | ICD-10-CM | POA: Diagnosis present

## 2020-08-24 DIAGNOSIS — Z993 Dependence on wheelchair: Secondary | ICD-10-CM

## 2020-08-24 DIAGNOSIS — G9341 Metabolic encephalopathy: Secondary | ICD-10-CM | POA: Diagnosis present

## 2020-08-24 DIAGNOSIS — L8990 Pressure ulcer of unspecified site, unspecified stage: Secondary | ICD-10-CM | POA: Insufficient documentation

## 2020-08-24 DIAGNOSIS — F419 Anxiety disorder, unspecified: Secondary | ICD-10-CM | POA: Diagnosis present

## 2020-08-24 DIAGNOSIS — Z90711 Acquired absence of uterus with remaining cervical stump: Secondary | ICD-10-CM

## 2020-08-24 DIAGNOSIS — R630 Anorexia: Secondary | ICD-10-CM | POA: Diagnosis present

## 2020-08-24 DIAGNOSIS — Z86711 Personal history of pulmonary embolism: Secondary | ICD-10-CM

## 2020-08-24 DIAGNOSIS — Z881 Allergy status to other antibiotic agents status: Secondary | ICD-10-CM

## 2020-08-24 DIAGNOSIS — Z7901 Long term (current) use of anticoagulants: Secondary | ICD-10-CM

## 2020-08-24 DIAGNOSIS — Z66 Do not resuscitate: Secondary | ICD-10-CM | POA: Diagnosis present

## 2020-08-24 DIAGNOSIS — Z79899 Other long term (current) drug therapy: Secondary | ICD-10-CM

## 2020-08-24 DIAGNOSIS — Z8673 Personal history of transient ischemic attack (TIA), and cerebral infarction without residual deficits: Secondary | ICD-10-CM

## 2020-08-24 DIAGNOSIS — L89152 Pressure ulcer of sacral region, stage 2: Secondary | ICD-10-CM | POA: Diagnosis present

## 2020-08-24 DIAGNOSIS — E876 Hypokalemia: Secondary | ICD-10-CM | POA: Diagnosis not present

## 2020-08-24 LAB — CBC WITH DIFFERENTIAL/PLATELET
Abs Immature Granulocytes: 0.03 10*3/uL (ref 0.00–0.07)
Basophils Absolute: 0 10*3/uL (ref 0.0–0.1)
Basophils Relative: 0 %
Eosinophils Absolute: 0.1 10*3/uL (ref 0.0–0.5)
Eosinophils Relative: 2 %
HCT: 35.7 % — ABNORMAL LOW (ref 36.0–46.0)
Hemoglobin: 10.7 g/dL — ABNORMAL LOW (ref 12.0–15.0)
Immature Granulocytes: 0 %
Lymphocytes Relative: 30 %
Lymphs Abs: 2.2 10*3/uL (ref 0.7–4.0)
MCH: 26.4 pg (ref 26.0–34.0)
MCHC: 30 g/dL (ref 30.0–36.0)
MCV: 87.9 fL (ref 80.0–100.0)
Monocytes Absolute: 0.6 10*3/uL (ref 0.1–1.0)
Monocytes Relative: 9 %
Neutro Abs: 4.1 10*3/uL (ref 1.7–7.7)
Neutrophils Relative %: 59 %
Platelets: 277 10*3/uL (ref 150–400)
RBC: 4.06 MIL/uL (ref 3.87–5.11)
RDW: 16.4 % — ABNORMAL HIGH (ref 11.5–15.5)
WBC: 7.1 10*3/uL (ref 4.0–10.5)
nRBC: 0 % (ref 0.0–0.2)

## 2020-08-24 LAB — COMPREHENSIVE METABOLIC PANEL
ALT: 23 U/L (ref 0–44)
AST: 22 U/L (ref 15–41)
Albumin: 2.6 g/dL — ABNORMAL LOW (ref 3.5–5.0)
Alkaline Phosphatase: 54 U/L (ref 38–126)
Anion gap: 9 (ref 5–15)
BUN: 15 mg/dL (ref 8–23)
CO2: 23 mmol/L (ref 22–32)
Calcium: 8.6 mg/dL — ABNORMAL LOW (ref 8.9–10.3)
Chloride: 110 mmol/L (ref 98–111)
Creatinine, Ser: 1.09 mg/dL — ABNORMAL HIGH (ref 0.44–1.00)
GFR, Estimated: 55 mL/min — ABNORMAL LOW (ref >60)
Glucose, Bld: 102 mg/dL — ABNORMAL HIGH (ref 70–99)
Potassium: 3.9 mmol/L (ref 3.5–5.1)
Sodium: 142 mmol/L (ref 135–145)
Total Bilirubin: 0.4 mg/dL (ref 0.3–1.2)
Total Protein: 6.3 g/dL — ABNORMAL LOW (ref 6.5–8.1)

## 2020-08-24 LAB — URINALYSIS, ROUTINE W REFLEX MICROSCOPIC
Bacteria, UA: NONE SEEN
Bilirubin Urine: NEGATIVE
Glucose, UA: NEGATIVE mg/dL
Ketones, ur: 5 mg/dL — AB
Leukocytes,Ua: NEGATIVE
Nitrite: NEGATIVE
Protein, ur: 30 mg/dL — AB
Specific Gravity, Urine: 1.008 (ref 1.005–1.030)
pH: 6 (ref 5.0–8.0)

## 2020-08-24 LAB — CBG MONITORING, ED: Glucose-Capillary: 83 mg/dL (ref 70–99)

## 2020-08-24 LAB — GLUCOSE, CAPILLARY
Glucose-Capillary: 80 mg/dL (ref 70–99)
Glucose-Capillary: 93 mg/dL (ref 70–99)

## 2020-08-24 LAB — AMMONIA: Ammonia: 10 umol/L (ref 9–35)

## 2020-08-24 LAB — TROPONIN I (HIGH SENSITIVITY)
Troponin I (High Sensitivity): 9 ng/L (ref <18)
Troponin I (High Sensitivity): 9 ng/L (ref <18)

## 2020-08-24 LAB — HEMOGLOBIN A1C
Hgb A1c MFr Bld: 5.4 % (ref 4.8–5.6)
Mean Plasma Glucose: 108 mg/dL

## 2020-08-24 LAB — LACTIC ACID, PLASMA: Lactic Acid, Venous: 1.2 mmol/L (ref 0.5–1.9)

## 2020-08-24 MED ORDER — LISINOPRIL 5 MG PO TABS
15.0000 mg | ORAL_TABLET | Freq: Every day | ORAL | Status: DC
  Administered 2020-08-26 – 2020-08-28 (×3): 15 mg via ORAL
  Filled 2020-08-24 (×4): qty 1

## 2020-08-24 MED ORDER — METFORMIN HCL 500 MG PO TABS
1000.0000 mg | ORAL_TABLET | Freq: Every day | ORAL | Status: DC
  Filled 2020-08-24: qty 2

## 2020-08-24 MED ORDER — SENNA 8.6 MG PO TABS
1.0000 | ORAL_TABLET | Freq: Every day | ORAL | Status: DC | PRN
  Filled 2020-08-24: qty 1

## 2020-08-24 MED ORDER — MIRTAZAPINE 7.5 MG PO TABS: 7.5000 mg | ORAL_TABLET | Freq: Every day | ORAL | 0 refills | Status: AC

## 2020-08-24 MED ORDER — FERROUS SULFATE 325 (65 FE) MG PO TABS
325.0000 mg | ORAL_TABLET | Freq: Every day | ORAL | Status: DC
  Administered 2020-08-26 – 2020-08-28 (×3): 325 mg via ORAL
  Filled 2020-08-24 (×4): qty 1

## 2020-08-24 MED ORDER — RIVAROXABAN 20 MG PO TABS
20.0000 mg | ORAL_TABLET | Freq: Every day | ORAL | Status: DC
  Administered 2020-08-26 – 2020-08-28 (×3): 20 mg via ORAL
  Filled 2020-08-24 (×3): qty 1

## 2020-08-24 MED ORDER — AMMONIUM LACTATE 12 % EX LOTN
TOPICAL_LOTION | Freq: Two times a day (BID) | CUTANEOUS | Status: DC
  Filled 2020-08-24: qty 450
  Filled 2020-08-24: qty 400

## 2020-08-24 MED ORDER — GABAPENTIN 100 MG PO CAPS
100.0000 mg | ORAL_CAPSULE | Freq: Three times a day (TID) | ORAL | Status: DC
  Administered 2020-08-26 – 2020-08-28 (×9): 100 mg via ORAL
  Filled 2020-08-24 (×11): qty 1

## 2020-08-24 MED ORDER — ATORVASTATIN CALCIUM 40 MG PO TABS
40.0000 mg | ORAL_TABLET | Freq: Every day | ORAL | Status: DC
  Administered 2020-08-26 – 2020-08-28 (×3): 40 mg via ORAL
  Filled 2020-08-24 (×4): qty 1

## 2020-08-24 MED ORDER — FLUTICASONE PROPIONATE 50 MCG/ACT NA SUSP
1.0000 | Freq: Every day | NASAL | Status: DC
  Administered 2020-08-25 – 2020-08-28 (×3): 1 via NASAL
  Filled 2020-08-24 (×2): qty 16

## 2020-08-24 MED ORDER — VERAPAMIL HCL ER 180 MG PO TBCR
180.0000 mg | EXTENDED_RELEASE_TABLET | Freq: Every day | ORAL | Status: DC
  Administered 2020-08-26 – 2020-08-28 (×3): 180 mg via ORAL
  Filled 2020-08-24 (×8): qty 1

## 2020-08-24 MED ORDER — ASPIRIN 81 MG PO CHEW
81.0000 mg | CHEWABLE_TABLET | Freq: Every day | ORAL | Status: DC
  Administered 2020-08-26 – 2020-08-28 (×3): 81 mg via ORAL
  Filled 2020-08-24 (×4): qty 1

## 2020-08-24 MED ORDER — VITAMIN D 25 MCG (1000 UNIT) PO TABS
1000.0000 [IU] | ORAL_TABLET | Freq: Every day | ORAL | Status: DC
  Administered 2020-08-26 – 2020-08-28 (×3): 1000 [IU] via ORAL
  Filled 2020-08-24 (×5): qty 1

## 2020-08-24 MED ORDER — OXYCODONE-ACETAMINOPHEN 5-325 MG PO TABS: 1.0000 | ORAL_TABLET | Freq: Four times a day (QID) | ORAL | 0 refills | Status: DC

## 2020-08-24 MED ORDER — SODIUM CHLORIDE 0.45 % IV SOLN: INTRAVENOUS | Status: DC

## 2020-08-24 MED ORDER — GLUCERNA SHAKE PO LIQD
237.0000 mL | Freq: Three times a day (TID) | ORAL | Status: DC
  Administered 2020-08-26 – 2020-08-28 (×5): 237 mL via ORAL
  Filled 2020-08-24 (×6): qty 237

## 2020-08-24 MED ORDER — INSULIN ASPART 100 UNIT/ML IJ SOLN: 0.0000 [IU] | Freq: Three times a day (TID) | INTRAMUSCULAR | Status: DC

## 2020-08-24 MED ORDER — GLUCERNA SHAKE PO LIQD: 237.0000 mL | Freq: Three times a day (TID) | ORAL | 0 refills | Status: DC

## 2020-08-24 MED ORDER — ASCORBIC ACID 500 MG PO TABS
500.0000 mg | ORAL_TABLET | Freq: Every day | ORAL | Status: DC
  Administered 2020-08-26 – 2020-08-28 (×3): 500 mg via ORAL
  Filled 2020-08-24 (×4): qty 1

## 2020-08-24 MED ORDER — MIRTAZAPINE 15 MG PO TABS
7.5000 mg | ORAL_TABLET | Freq: Every day | ORAL | Status: DC
  Administered 2020-08-26 – 2020-08-27 (×3): 7.5 mg via ORAL
  Filled 2020-08-24 (×4): qty 1

## 2020-08-24 NOTE — ED Triage Notes (Signed)
Patient to ED via EMS after returning to [facility] one hour ago after inpatient stay at this facility. Family states that she is not acting herself and was vocal while here and speaking with them. They report since going back to facility patient is not talking. Patient will not answer this nurses questions. Just stares blankly at nurse. Dr. Sabra Heck made aware of complaint and LKN.

## 2020-08-24 NOTE — ED Provider Notes (Signed)
[facility] Provider Note   CSN: [id number] Arrival date & time: 08/24/20  1812     History Chief Complaint  Patient presents with   Altered Mental Status    [name] is a 70 y.o. female.  HPI  This patient is a 70 year old female with a prior history of pulmonary embolism on Xarelto, type 2 diabetes, history of hypertension and a recent admission to the hospital for altered mental status, which was thought to be metabolic encephalopathy secondary to a urinary tract infection which was inadequately treated.  She was given oral antibiotics in the hospital, she improved back to her baseline and was discharged back to her skilled nursing facility today however upon arrival back to the nursing facility she was essentially unresponsive again.  She appeared to be awake however she would not follow commands, she would not talk, she would not respond to external stimuli, she will not answer any my questions, level 5 caveat applies secondary to altered mental status.  The prior medical record was reviewed and show that the patient had been admitted to the hospital for 4 days during which time she did not have any neuroimaging or EEG, I do see that she had some lab work done which was overall unremarkable and even the urinary culture showed multiple different species.  Past Medical History:  Diagnosis Date   Cellulitis lower extremities   4 day hospitalization 12/25/14-12/29/14   Essential hypertension, benign    History of MRSA infection    History of panniculitis    Pulmonary emboli ([place]) 2009   Skin ulcer of thigh ([place])    Type 2 diabetes mellitus [facility])     Patient Active Problem List   Diagnosis Date Noted   SIRS (systemic inflammatory response syndrome) ([place]) 08/21/2020   UTI (urinary tract infection) 08/21/2020   Hyperkalemia 08/21/2020   Hypoalbuminemia due to protein-calorie malnutrition ([place]) 07/37/1062   Acute metabolic encephalopathy 69/48/5462   CVA  (cerebral vascular accident) ([place]) 07/24/2018   Depression 04/12/2015   TIA (transient ischemic attack) 04/09/2015   Aphasia 04/09/2015   Cellulitis and abscess of leg 04/09/2015   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   Fever, unspecified 12/25/2014   Sepsis ([place]) 12/25/2014   Cellulitis 12/25/2014   Essential hypertension, benign 12/20/2011   History of pulmonary embolus (PE) 12/20/2011   Morbid obesity ([place]) 12/20/2011   Preoperative cardiovascular examination 12/20/2011   Type 2 diabetes mellitus ([place]) 09/11/2006    Past Surgical History:  Procedure Laterality Date   PARTIAL HYSTERECTOMY     Right thigh ulcer debridement  2008   Two occasions - wound VAC     OB History     Gravida      Para      Term      Preterm      AB      Living  1      SAB      IAB      Ectopic      Multiple      Live Births              Family History  Problem Relation Age of Onset   Cancer - Colon Father    Heart disease Mother    Diabetes Mellitus II Mother     Social History   Tobacco Use   Smoking status: Never   Smokeless tobacco: Never  Vaping Use   Vaping Use: Never used  Substance Use Topics   Alcohol  use: No   Drug use: No    Home Medications Prior to Admission medications   Medication Sig Start Date End Date Taking? Authorizing Provider  ammonium lactate (LAC-HYDRIN) 12 % lotion Apply topically 2 (two) times daily. 04/13/15   Sinda Du, MD  aspirin 81 MG chewable tablet Chew 1 tablet (81 mg total) by mouth daily. 04/13/15   Sinda Du, MD  atorvastatin (LIPITOR) 40 MG tablet Take 40 mg by mouth daily. 07/16/20   [provider]  cholecalciferol (VITAMIN D3) 25 MCG (1000 UNIT) tablet Take 1,000 Units by mouth daily.    [provider]  feeding supplement, GLUCERNA SHAKE, (GLUCERNA SHAKE) LIQD Take 237 mLs by mouth 3 (three) times daily between meals. 08/24/20   Manuella Ghazi, Pratik D, DO  ferrous sulfate 325 (65 FE) MG tablet Take 325 mg by  mouth daily with breakfast.    [provider]  fluticasone (FLONASE) 50 MCG/ACT nasal spray Place 1 spray into both nostrils daily.    [provider]  gabapentin (NEURONTIN) 100 MG capsule Take 100 mg by mouth 3 (three) times daily. 07/19/18   [provider]  lisinopril (ZESTRIL) 30 MG tablet Take 15 mg by mouth daily. 06/26/20   [provider]  metFORMIN (GLUCOPHAGE) 1000 MG tablet Take 1 tablet by mouth 2 (two) times a day. 06/30/18   [provider]  mirtazapine (REMERON) 7.5 MG tablet Take 1 tablet (7.5 mg total) by mouth at bedtime. 08/24/20 09/23/20  Manuella Ghazi, Pratik D, DO  oxyCODONE-acetaminophen (PERCOCET/ROXICET) 5-325 MG tablet Take 1 tablet by mouth 4 (four) times daily. 08/24/20   Manuella Ghazi, Pratik D, DO  senna (SENOKOT) 8.6 MG TABS tablet Take 1 tablet (8.6 mg total) by mouth daily as needed for mild constipation. 04/13/15   Sinda Du, MD  verapamil (CALAN-SR) 180 MG CR tablet Take 180 mg by mouth daily. 07/01/18   [provider]  vitamin C (ASCORBIC ACID) 500 MG tablet Take 500 mg by mouth daily.    [provider]  XARELTO 20 MG TABS Take 20 mg by mouth daily with supper.  11/29/11   [provider]    Allergies    Vancomycin  Review of Systems   Review of Systems  Unable to perform ROS: Mental status change   Physical Exam Updated Vital Signs BP 127/79   Pulse 83   Temp 98.9 F (37.2 C) (Oral)   Resp 18   Ht 1.575 m (5\' 2" )   Wt 110.9 kg   SpO2 100%   BMI 44.72 kg/m   Physical Exam Vitals and nursing note reviewed.  Constitutional:      General: She is not in acute distress.    Appearance: She is well-developed.  HENT:     Head: Normocephalic and atraumatic.     Mouth/Throat:     Pharynx: No oropharyngeal exudate.  Eyes:     General: No scleral icterus.       Right eye: No discharge.        Left eye: No discharge.     Conjunctiva/sclera: Conjunctivae normal.     Pupils: Pupils are equal, round,  and reactive to light.  Neck:     Thyroid: No thyromegaly.     Vascular: No JVD.  Cardiovascular:     Rate and Rhythm: Normal rate and regular rhythm.     Heart sounds: Normal heart sounds. No murmur heard.   No friction rub. No gallop.  Pulmonary:     Effort: Pulmonary  effort is normal. No respiratory distress.     Breath sounds: Normal breath sounds. No wheezing or rales.  Abdominal:     General: Bowel sounds are normal. There is no distension.     Palpations: Abdomen is soft. There is no mass.     Tenderness: There is no abdominal tenderness.  Musculoskeletal:        General: No tenderness. Normal range of motion.     Cervical back: Normal range of motion and neck supple.     Right lower leg: No edema.     Left lower leg: No edema.     Comments: Excessive redundant tissue around the abdomen arms and legs  Lymphadenopathy:     Cervical: No cervical adenopathy.  Skin:    General: Skin is warm and dry.     Findings: No erythema or rash.  Neurological:     Mental Status: She is alert.     Coordination: Coordination normal.     Comments: The patient does not follow commands, she has her eyes open looking straight forward, no real grimace to painful stimuli, when I lift her arms up she slowly lowers them, when I lift her legs up they fall to the ground, no speech  Psychiatric:        Behavior: Behavior normal.    ED Results / Procedures / Treatments   Labs (all labs ordered are listed, but only abnormal results are displayed) Labs Reviewed  CBC WITH DIFFERENTIAL/PLATELET - Abnormal; Notable for the following components:      Result Value   Hemoglobin 10.7 (*)    HCT 35.7 (*)    RDW 16.4 (*)    All other components within normal limits  COMPREHENSIVE METABOLIC PANEL - Abnormal; Notable for the following components:   Glucose, Bld 102 (*)    Creatinine, Ser 1.09 (*)    Calcium 8.6 (*)    Total Protein 6.3 (*)    Albumin 2.6 (*)    GFR, Estimated 55 (*)    All other  components within normal limits  URINALYSIS, ROUTINE W REFLEX MICROSCOPIC - Abnormal; Notable for the following components:   Color, Urine STRAW (*)    Hgb urine dipstick SMALL (*)    Ketones, ur 5 (*)    Protein, ur 30 (*)    Non Squamous Epithelial 0-5 (*)    All other components within normal limits  URINE CULTURE  LACTIC ACID, PLASMA  AMMONIA  TROPONIN I (HIGH SENSITIVITY)  TROPONIN I (HIGH SENSITIVITY)    EKG EKG Interpretation  Date/Time:  Tuesday August 24 2020 18:38:26 EDT Ventricular Rate:  92 PR Interval:  137 QRS Duration: 90 QT Interval:  359 QTC Calculation: 445 R Axis:   -32 Text Interpretation: Sinus rhythm Left axis deviation Consider anterior infarct Minimal ST depression, lateral leads Confirmed by Noemi Chapel [phone]) on 08/24/2020 6:50:56 PM  Radiology CT Head Wo Contrast  Result Date: 08/24/2020 CLINICAL DATA:  Altered mental status EXAM: CT HEAD WITHOUT CONTRAST TECHNIQUE: Contiguous axial images were obtained from the base of the skull through the vertex without intravenous contrast. COMPARISON:  MR brain dated 07/20/2020 FINDINGS: Brain: No evidence of acute infarction, hemorrhage, hydrocephalus, extra-axial collection or mass lesion/mass effect. Old left basal ganglia lacunar infarct. Subcortical white matter and periventricular small vessel ischemic changes. Vascular: Intracranial atherosclerosis. Skull: Normal. Negative for fracture or focal lesion. Sinuses/Orbits: The visualized paranasal sinuses are essentially clear. The mastoid air cells are unopacified. Other: None. IMPRESSION: No evidence of acute  intracranial abnormality. Old left basal ganglia lacunar infarct. Small vessel ischemic changes. Electronically Signed   By: Julian Hy M.D.   On: 08/24/2020 19:32    Procedures Procedures   Medications Ordered in ED Medications - No data to display  ED Course  I have reviewed the triage vital signs and the nursing notes.  Pertinent labs & imaging  results that were available during my care of the patient were reviewed by me and considered in my medical decision making (see chart for details).    MDM Rules/Calculators/A&P                          This patient has significant cognitive slowing, is not following commands, significantly altered, that being said vital signs are rather unremarkable.  We will reengage with labs, check with neurology to see if they want anything else done, she does not appear to be a code stroke as far as I can tell.  I discussed the case with neurology, they have agreed that this patient would likely benefit from an EEG and inpatient monitoring.  They recommend transfer to [facility].  I discussed this with Dr. Linda Hedges of the hospitalist service.  Labs are pretty unremarkable, CT scan unremarkable  Final Clinical Impression(s) / ED Diagnoses Final diagnoses:  Acute encephalopathy    Rx / DC Orders ED Discharge Orders     None        Noemi Chapel, MD 08/24/20 2028

## 2020-08-24 NOTE — H&P (Signed)
History and Physical    [name] AYT:[id number] DOB: 1950-02-24 DOA: 08/24/2020  PCP: Caprice Renshaw, MD (Confirm with patient/family/NH records and if not entered, this has to be entered at [facility] point of entry) Patient coming from: SNF - [facility]  I have personally briefly reviewed patient's old medical records in [place]  Chief Complaint: obtunded  HPI: [name] is a 70 y.o. female with medical history significant of prior PE with current Xarelto, type 2 diabetes, hypertension, recurrent cellulitis, prior TIA on 03/2015, prior stroke (07/24/2018) and chronic ambulatory dysfunction-wheelchair-bound who presented to the ED from [facility] due to altered mental status.  Patient was recently admitted with acute metabolic encephalopathy likely secondary to failure of outpatient treatment of UTI with IM Rocephin.  Patient was empirically treated for UTI with cefepime and had profound improvement in her mentation over the following 1-2 days. She was d/c'd back to [facility] 04/25/[facility] mid-day. On arrival to [facility] SNF she was awake but would not/could not follow commands, could not talk or respond to external stimuli. She was transported to [facility] for further evaluation  ED Course: T 98.9  127/79  HR 83  RR 18  BMI 44.7. EDP exam notable for patient's inabiilty to follow commands, no response to painful stimuli, flaccid LE. Lab: Troponin 9, U/A negative, nl WBC, nl Diff, Cmet nl. EDP consulted with Neuro at [facility] - question of seizure activity vs encephalopathy of unknown origin. Recommendation was to admit to [facility] for EEG and further neuro evaluation. [facility] called to admit patient.  Review of Systems: As per HPI otherwise 10 point review of systems negative.    Past Medical History:  Diagnosis Date   Cellulitis lower extremities   4 day hospitalization 12/25/14-12/29/14   Essential hypertension, benign    History of MRSA infection    History of panniculitis    Pulmonary emboli  ([place]) 2009   Skin ulcer of thigh ([place])    Type 2 diabetes mellitus ([place])     Past Surgical History:  Procedure Laterality Date   PARTIAL HYSTERECTOMY     Right thigh ulcer debridement  2008   Two occasions - wound VAC    Soc Hx - single woman. She has one son. She is bedbound/wheel chair bound after CVA and resides in long-term care facility.    reports that she has never smoked. She has never used smokeless tobacco. She reports that she does not drink alcohol and does not use drugs.  Allergies  Allergen Reactions   Vancomycin Hives and Rash    "big red bumps, then they turned white, then I shedded like a snake."     Family History  Problem Relation Age of Onset   Cancer - Colon Father    Heart disease Mother    Diabetes Mellitus II Mother     Prior to Admission medications   Medication Sig Start Date End Date Taking? Authorizing Provider  ammonium lactate (LAC-HYDRIN) 12 % lotion Apply topically 2 (two) times daily. 04/13/15   Sinda Du, MD  aspirin 81 MG chewable tablet Chew 1 tablet (81 mg total) by mouth daily. 04/13/15   Sinda Du, MD  atorvastatin (LIPITOR) 40 MG tablet Take 40 mg by mouth daily. 07/16/20   [provider]  cholecalciferol (VITAMIN D3) 25 MCG (1000 UNIT) tablet Take 1,000 Units by mouth daily.    [provider]  feeding supplement, GLUCERNA SHAKE, (GLUCERNA SHAKE) LIQD Take 237 mLs by mouth 3 (  three) times daily between meals. 08/24/20   Manuella Ghazi, Pratik D, DO  ferrous sulfate 325 (65 FE) MG tablet Take 325 mg by mouth daily with breakfast.    [provider]  fluticasone (FLONASE) 50 MCG/ACT nasal spray Place 1 spray into both nostrils daily.    [provider]  gabapentin (NEURONTIN) 100 MG capsule Take 100 mg by mouth 3 (three) times daily. 07/19/18   [provider]  lisinopril (ZESTRIL) 30 MG tablet Take 15 mg by mouth daily. 06/26/20   [provider]  metFORMIN (GLUCOPHAGE) 1000 MG tablet Take  1 tablet by mouth 2 (two) times a day. 06/30/18   [provider]  mirtazapine (REMERON) 7.5 MG tablet Take 1 tablet (7.5 mg total) by mouth at bedtime. 08/24/20 09/23/20  Manuella Ghazi, Pratik D, DO  oxyCODONE-acetaminophen (PERCOCET/ROXICET) 5-325 MG tablet Take 1 tablet by mouth 4 (four) times daily. 08/24/20   Manuella Ghazi, Pratik D, DO  senna (SENOKOT) 8.6 MG TABS tablet Take 1 tablet (8.6 mg total) by mouth daily as needed for mild constipation. 04/13/15   Sinda Du, MD  verapamil (CALAN-SR) 180 MG CR tablet Take 180 mg by mouth daily. 07/01/18   [provider]  vitamin C (ASCORBIC ACID) 500 MG tablet Take 500 mg by mouth daily.    [provider]  XARELTO 20 MG TABS Take 20 mg by mouth daily with supper.  11/29/11   [provider]    Physical Exam: Vitals:   08/24/20 1900 08/24/20 1930 08/24/20 1945 08/24/20 2000  BP: (!) 140/107 (!) 181/168 110/72 127/79  Pulse: 95  (!) 103 83  Resp: 17 18 (!) 21 18  Temp:      TempSrc:      SpO2: 100%  100% 100%  Weight:      Height:         Vitals:   08/24/20 1900 08/24/20 1930 08/24/20 1945 08/24/20 2000  BP: (!) 140/107 (!) 181/168 110/72 127/79  Pulse: 95  (!) 103 83  Resp: 17 18 (!) 21 18  Temp:      TempSrc:      SpO2: 100%  100% 100%  Weight:      Height:       General: morbidly obese woman who is awake and in no distress Eyes: PERRL, lids and conjunctivae normal, slight lid- lag noted OD ENMT: Mucous membranes are moist.   Neck: obese hindering exam, supple, no masses, no thyromegaly Respiratory: clear to auscultation bilaterally, no wheezing, no crackles. Normal respiratory effort. No accessory muscle use.  Cardiovascular: Regular rate and rhythm, no murmurs / rubs / gallops. No extremity edema. trace pedal pulses. No carotid bruits.  Abdomen: morbidly obese, no tenderness, no masses palpated. Hepatosplenomegaly could not be assessed due to obesity. Bowel sounds positive.  Musculoskeletal: no clubbing /  cyanosis. No joint deformity upper and lower extremities. Good ROM UE, patient with minimal movement LE. Flexed left leg. Decreased muscle tone.  Skin: no rashes, lesions, ulcers. Caveat: due to size could not roll over to exam back or sacrum, excessive redundant skin folds thighs and could not exam intertriginous spaces.  Neurologic: CN 2-12 normal facial symmetry and movement except for mild lid lag OD, PERRLA, EOMI. Vision and visual fields not checked. Patient able to speak softly. She is able to follow simple one-step commands.DTR normal at bicipital tendon right and left. Mycolonic movement of thumb and hand noted bilaterally.  Strength 3/5 in all 4.  Psychiatric: No recall of events  at SNF but does recollect leaving the hospital earlier in the day. Presently awake, oriented to self, oriented to place.. Normal mood.     Labs on Admission: I have personally reviewed following labs and imaging studies  CBC: Recent Labs  Lab 08/20/20 2030 08/21/20 0643 08/22/20 0407 08/23/20 0606 08/24/20 1838  WBC 7.3 5.7 5.0 5.7 7.1  NEUTROABS 6.0  --   --   --  4.1  HGB 10.8* 9.6* 9.0* 9.2* 10.7*  HCT 36.8 32.0* 30.5* 31.7* 35.7*  MCV 90.0 89.4 89.7 90.1 87.9  PLT 289 221 219 217 893   Basic Metabolic Panel: Recent Labs  Lab 08/20/20 2213 08/21/20 0643 08/22/20 0407 08/23/20 0606 08/24/20 1838  NA 138 139 139 139 142  K 5.3* 4.7 4.2 4.2 3.9  CL 108 107 109 108 110  CO2 22 23 23 25 23   GLUCOSE 82 83 91 91 102*  BUN 20 18 16 15 15   CREATININE 1.02* 0.88 1.04* 0.98 1.09*  CALCIUM 8.5* 8.5* 8.4* 8.2* 8.6*  MG  --  1.3* 1.5* 1.8  --   PHOS  --  3.2  --   --   --    GFR: Estimated Creatinine Clearance: 56.4 mL/min (A) (by C-G formula based on SCr of 1.09 mg/dL (H)). Liver Function Tests: Recent Labs  Lab 08/20/20 2213 08/21/20 0643 08/24/20 1838  AST 19 18 22   ALT 17 17 23   ALKPHOS 53 46 54  BILITOT 0.2* 0.4 0.4  PROT 6.7 5.6* 6.3*  ALBUMIN 2.9* 2.4* 2.6*   No results for  input(s): LIPASE, AMYLASE in the last 168 hours. Recent Labs  Lab 08/24/20 1838  AMMONIA 10   Coagulation Profile: Recent Labs  Lab 08/21/20 0643  INR 1.1   Cardiac Enzymes: No results for input(s): CKTOTAL, CKMB, CKMBINDEX, TROPONINI in the last 168 hours. BNP (last 3 results) No results for input(s): PROBNP in the last 8760 hours. HbA1C: No results for input(s): HGBA1C in the last 72 hours. CBG: Recent Labs  Lab 08/23/20 1122 08/23/20 1655 08/23/20 2100 08/24/20 0737 08/24/20 1152  GLUCAP 94 88 145* 80 93   Lipid Profile: No results for input(s): CHOL, HDL, LDLCALC, TRIG, CHOLHDL, LDLDIRECT in the last 72 hours. Thyroid Function Tests: No results for input(s): TSH, T4TOTAL, FREET4, T3FREE, THYROIDAB in the last 72 hours. Anemia Panel: No results for input(s): VITAMINB12, FOLATE, FERRITIN, TIBC, IRON, RETICCTPCT in the last 72 hours. Urine analysis:    Component Value Date/Time   COLORURINE STRAW (A) 08/24/2020 1942   APPEARANCEUR CLEAR 08/24/2020 1942   LABSPEC 1.008 08/24/2020 1942   PHURINE 6.0 08/24/2020 1942   GLUCOSEU NEGATIVE 08/24/2020 1942   HGBUR SMALL (A) 08/24/2020 1942   BILIRUBINUR NEGATIVE 08/24/2020 1942   KETONESUR 5 (A) 08/24/2020 1942   PROTEINUR 30 (A) 08/24/2020 1942   UROBILINOGEN 0.2 12/25/2014 1248   NITRITE NEGATIVE 08/24/2020 1942   LEUKOCYTESUR NEGATIVE 08/24/2020 1942    Radiological Exams on Admission: CT Head Wo Contrast  Result Date: 08/24/2020 CLINICAL DATA:  Altered mental status EXAM: CT HEAD WITHOUT CONTRAST TECHNIQUE: Contiguous axial images were obtained from the base of the skull through the vertex without intravenous contrast. COMPARISON:  MR brain dated 07/20/2020 FINDINGS: Brain: No evidence of acute infarction, hemorrhage, hydrocephalus, extra-axial collection or mass lesion/mass effect. Old left basal ganglia lacunar infarct. Subcortical white matter and periventricular small vessel ischemic changes. Vascular:  Intracranial atherosclerosis. Skull: Normal. Negative for fracture or focal lesion. Sinuses/Orbits: The visualized paranasal sinuses are  essentially clear. The mastoid air cells are unopacified. Other: None. IMPRESSION: No evidence of acute intracranial abnormality. Old left basal ganglia lacunar infarct. Small vessel ischemic changes. Electronically Signed   By: Julian Hy M.D.   On: 08/24/2020 19:32    EKG: Independently reviewed. NSR, left axis deviation, no acute changes.  Assessment/Plan Active Problems:   Encephalopathy acute   Type 2 diabetes mellitus ([place])   Essential hypertension, benign   Morbid obesity ([place])   Acute encephalopathy - patient with recent admission 71-08/24/20 for acute metabolic encephalopathy 2/2 UTI which seemingly cleared completely. May 31/22 she had MRI brain w/o acute finding but chronic infarcts left frontal lob old small vessely infarcts diffusely left brain. MRI brain 07/20/20 no LVO. Today with an episode of being unresponsive. Question of neurologic origin, I.e. intermittent seizure. Neuro recommends admitting to [facility] for further evaluation including EEG  Plan Transfer to [facility] medical telemetry bed  EEG - ordered  Neuro - consult. Neurology aware  2. DM - patient usually takes metformin. Last A1C 08/21/20 5.4%  Plan Continue home regimen  Sliding scale coverage  3. HTN - continue home meds.   DVT prophylaxis: Xarelto- to be continued   Code Status: DNR  Family Communication: Park Meo, son, present during interview and exam. Aware of current evaluation and plan to transfer to [facility] for further testing  Disposition Plan: return to SNF when stable  Consults called: Neuro hospitalist - Dr. Curly Shores was contacted by Dr. Sabra Heck  Admission status: inpatient - medical telemetry Adella Hare MD [facility] Pager [phone]  If 7PM-7AM, please contact night-coverage [email or web address] Password [email or web address]  08/24/2020, 10:12 PM

## 2020-08-24 NOTE — ED Notes (Signed)
[facility] staff called CN to report that patient is at baseline and son requested eval.

## 2020-08-24 NOTE — Discharge Summary (Signed)
Physician Discharge Summary  [name] XBD:[id number] DOB: 10-21-1950 DOA: 08/20/2020  PCP: Caprice Renshaw, MD  Admit date: 08/20/2020  Discharge date: 08/24/2020  Admitted From:SNF  Disposition:  SNF  Recommendations for Outpatient Follow-up:  Follow up with PCP in 1-2 weeks Patient has completed treatment for UTI with no specific growth noted on cultures Started on mirtazapine to help with anorexia and major depressive disorder Continue other home medications as prior  Home Health: None  Equipment/Devices: None  Discharge Condition:Stable  CODE STATUS: DNR  Diet recommendation: Heart Healthy/carb modified  Brief/Interim Summary:  [name] is a 70 y.o. female with medical history significant for  prior PE with current Xarelto, type 2 diabetes, hypertension, recurrent cellulitis, prior TIA on 03/2015, prior stroke (07/24/2018) and chronic ambulatory dysfunction-wheelchair-bound who presented to the ED from [facility] who presents to the emergency department from [facility] health due to altered mental status.  Patient was admitted with acute metabolic encephalopathy likely secondary to failure of outpatient treatment of UTI with IM Rocephin.  Patient was empirically treated for UTI with cefepime and had profound improvement in her mentation over the following 1-2 days.  She did complain of some diarrhea which also spontaneously resolved.  She has been noted to have some anorexia that is ongoing for the last several months ever since her husband passed and she states that she has lost approximately 60 pounds.  As a result, she has been started on mirtazapine to assist with her appetite and depression.  No other acute events noted throughout the course of this admission and she is stable for discharge today.  Discharge Diagnoses:  Principal Problem:   Acute metabolic encephalopathy Active Problems:   Type 2 diabetes mellitus (HCC)   Essential hypertension, benign   History of pulmonary  embolus (PE)   Morbid obesity (HCC)   TIA (transient ischemic attack)   CVA (cerebral vascular accident) ([place])   SIRS (systemic inflammatory response syndrome) ([place])   UTI (urinary tract infection)   Hyperkalemia   Hypoalbuminemia due to protein-calorie malnutrition ([place])  Principal discharge diagnosis: Acute metabolic encephalopathy secondary to UTI.  Discharge Instructions  Discharge Instructions     Diet - low sodium heart healthy   Complete by: As directed    Discharge wound care:   Complete by: As directed    Daily wound care.   Increase activity slowly   Complete by: As directed       Allergies as of 08/24/2020       Reactions   Vancomycin Hives, Rash   "big red bumps, then they turned white, then I shedded like a snake."         Medication List     STOP taking these medications    cephALEXin 500 MG capsule Commonly known as: KEFLEX   citalopram 20 MG tablet Commonly known as: CELEXA       TAKE these medications    ammonium lactate 12 % lotion Commonly known as: LAC-HYDRIN Apply topically 2 (two) times daily.   aspirin 81 MG chewable tablet Chew 1 tablet (81 mg total) by mouth daily.   atorvastatin 40 MG tablet Commonly known as: LIPITOR Take 40 mg by mouth daily.   cholecalciferol 25 MCG (1000 UNIT) tablet Commonly known as: VITAMIN D3 Take 1,000 Units by mouth daily.   feeding supplement (GLUCERNA SHAKE) Liqd Take 237 mLs by mouth 3 (three) times daily between meals.   ferrous sulfate 325 (65 FE) MG tablet Take 325 mg by mouth  daily with breakfast.   fluticasone 50 MCG/ACT nasal spray Commonly known as: FLONASE Place 1 spray into both nostrils daily.   gabapentin 100 MG capsule Commonly known as: NEURONTIN Take 100 mg by mouth 3 (three) times daily.   lisinopril 30 MG tablet Commonly known as: ZESTRIL Take 15 mg by mouth daily.   metFORMIN 1000 MG tablet Commonly known as: GLUCOPHAGE Take 1 tablet by mouth 2 (two) times a  day.   mirtazapine 7.5 MG tablet Commonly known as: REMERON Take 1 tablet (7.5 mg total) by mouth at bedtime.   oxyCODONE-acetaminophen 5-325 MG tablet Commonly known as: PERCOCET/ROXICET Take 1 tablet by mouth 4 (four) times daily.   senna 8.6 MG Tabs tablet Commonly known as: SENOKOT Take 1 tablet (8.6 mg total) by mouth daily as needed for mild constipation.   verapamil 180 MG CR tablet Commonly known as: CALAN-SR Take 180 mg by mouth daily.   vitamin C 500 MG tablet Commonly known as: ASCORBIC ACID Take 500 mg by mouth daily.   Xarelto 20 MG Tabs tablet Generic drug: rivaroxaban Take 20 mg by mouth daily with supper.               Discharge Care Instructions  (From admission, onward)           Start     Ordered   08/24/20 0000  Discharge wound care:       Comments: Daily wound care.   08/24/20 1044            Follow-up Information     Caprice Renshaw, MD. Schedule an appointment as soon as possible for a visit in 1 week(s).   Specialty: Internal Medicine Contact information: [place] [address] [phone]                Allergies  Allergen Reactions   Vancomycin Hives and Rash    "big red bumps, then they turned white, then I shedded like a snake."     Consultations: None   Procedures/Studies: DG Chest Port 1 View  Result Date: 08/20/2020 CLINICAL DATA:  Increased confusion. EXAM: PORTABLE CHEST 1 VIEW COMPARISON:  April 09, 2015 FINDINGS: Low lung volumes are seen with mild, chronic appearing diffusely increased lung markings. There is no evidence of acute infiltrate, pleural effusion or pneumothorax. The heart size and mediastinal contours are within normal limits. There is marked severity calcification of the aortic arch. The visualized skeletal structures are unremarkable. IMPRESSION: No active cardiopulmonary disease. Electronically Signed   By: Virgina Norfolk M.D.   On: 08/20/2020 21:07     Discharge  Exam: Vitals:   08/23/20 2052 08/24/20 0534  BP: 139/67 (!) 139/56  Pulse: 81 75  Resp: 19 18  Temp: 98 F (36.7 C) 97.6 F (36.4 C)  SpO2: 99% 100%   Vitals:   08/23/20 0500 08/23/20 1347 08/23/20 2052 08/24/20 0534  BP: 122/62 (!) 127/43 139/67 (!) 139/56  Pulse: 76 75 81 75  Resp: 18 18 19 18   Temp: 98.4 F (36.9 C) 98.4 F (36.9 C) 98 F (36.7 C) 97.6 F (36.4 C)  TempSrc: Oral   Oral  SpO2: 98% 100% 99% 100%  Weight:      Height:        General: Pt is alert, awake, not in acute distress, morbidly obese Cardiovascular: RRR, S1/S2 +, no rubs, no gallops Respiratory: CTA bilaterally, no wheezing, no rhonchi Abdominal: Soft, NT, ND, bowel sounds + Extremities: no edema, no cyanosis  The results of significant diagnostics from this hospitalization (including imaging, microbiology, ancillary and laboratory) are listed below for reference.     Microbiology: Recent Results (from the past 240 hour(s))  Blood Culture (routine x 2)     Status: None (Preliminary result)   Collection Time: 08/20/20  8:30 PM   Specimen: BLOOD LEFT ARM  Result Value Ref Range Status   Specimen Description BLOOD LEFT ARM  Final   Special Requests   Final    BOTTLES DRAWN AEROBIC AND ANAEROBIC Blood Culture adequate volume   Culture   Final    NO GROWTH 2 DAYS Performed at [facility], [address] [place], [place] [address]    Report Status PENDING  Incomplete  Resp Panel by RT-PCR (Flu A&B, Covid) Nasopharyngeal Swab     Status: None   Collection Time: 08/20/20  8:42 PM   Specimen: Nasopharyngeal Swab; Nasopharyngeal(NP) swabs in vial transport medium  Result Value Ref Range Status   SARS Coronavirus 2 by RT PCR NEGATIVE NEGATIVE Final    Comment: (NOTE) SARS-CoV-2 target nucleic acids are NOT DETECTED.  The SARS-CoV-2 RNA is generally detectable in upper respiratory specimens during the acute phase of infection. The lowest concentration of SARS-CoV-2 viral copies this assay  can detect is 138 copies/mL. A negative result does not preclude SARS-Cov-2 infection and should not be used as the sole basis for treatment or other patient management decisions. A negative result may occur with  improper specimen collection/handling, submission of specimen other than nasopharyngeal swab, presence of viral mutation(s) within the areas targeted by this assay, and inadequate number of viral copies(<138 copies/mL). A negative result must be combined with clinical observations, patient history, and epidemiological information. The expected result is Negative.  Fact Sheet for Patients:  EntrepreneurPulse.com.au  Fact Sheet for Healthcare Providers:  IncredibleEmployment.be  This test is no t yet approved or cleared by the [place] FDA and  has been authorized for detection and/or diagnosis of SARS-CoV-2 by FDA under an Emergency Use Authorization (EUA). This EUA will remain  in effect (meaning this test can be used) for the duration of the COVID-19 declaration under Section 564(b)(1) of the Act, 21 U.S.C.section 360bbb-3(b)(1), unless the authorization is terminated  or revoked sooner.       Influenza A by PCR NEGATIVE NEGATIVE Final   Influenza B by PCR NEGATIVE NEGATIVE Final    Comment: (NOTE) The Xpert Xpress SARS-CoV-2/FLU/RSV plus assay is intended as an aid in the diagnosis of influenza from Nasopharyngeal swab specimens and should not be used as a sole basis for treatment. Nasal washings and aspirates are unacceptable for Xpert Xpress SARS-CoV-2/FLU/RSV testing.  Fact Sheet for Patients: EntrepreneurPulse.com.au  Fact Sheet for Healthcare Providers: IncredibleEmployment.be  This test is not yet approved or cleared by the [place] FDA and has been authorized for detection and/or diagnosis of SARS-CoV-2 by FDA under an Emergency Use Authorization (EUA). This EUA will  remain in effect (meaning this test can be used) for the duration of the COVID-19 declaration under Section 564(b)(1) of the Act, 21 U.S.C. section 360bbb-3(b)(1), unless the authorization is terminated or revoked.  Performed at [facility], [address], [place], [place] [address]   Blood Culture (routine x 2)     Status: None (Preliminary result)   Collection Time: 08/20/20  8:42 PM   Specimen: Right Antecubital; Blood  Result Value Ref Range Status   Specimen Description RIGHT ANTECUBITAL  Final   Special Requests   Final  BOTTLES DRAWN AEROBIC AND ANAEROBIC Blood Culture adequate volume   Culture   Final    NO GROWTH 2 DAYS Performed at [facility], [address] [place], [place] [address]    Report Status PENDING  Incomplete  Urine culture     Status: Abnormal   Collection Time: 08/20/20 10:31 PM   Specimen: Urine, Clean Catch  Result Value Ref Range Status   Specimen Description   Final    URINE, CLEAN CATCH Performed at [facility] [address], [place], [place] [address]    Special Requests   Final    NONE Performed at [facility], [address], [place], [place] [address]    Culture MULTIPLE SPECIES PRESENT, SUGGEST RECOLLECTION (A)  Final   Report Status 08/22/2020 FINAL  Final  MRSA Next Gen by PCR, Nasal     Status: None   Collection Time: 08/21/20  5:18 AM   Specimen: Nasal Mucosa; Nasal Swab  Result Value Ref Range Status   MRSA by PCR Next Gen NOT DETECTED NOT DETECTED Final    Comment: (NOTE) The GeneXpert MRSA Assay (FDA approved for NASAL specimens only), is one component of a comprehensive MRSA colonization surveillance program. It is not intended to diagnose MRSA infection nor to guide or monitor treatment for MRSA infections. Test performance is not FDA approved in patients less than 47 years old. Performed at [facility], [address] [place], [place] [address]      Labs: BNP (last 3 results) No results for input(s): BNP in the  last 8760 hours. Basic Metabolic Panel: Recent Labs  Lab 08/20/20 2213 08/21/20 0643 08/22/20 0407 08/23/20 0606  NA 138 139 139 139  K 5.3* 4.7 4.2 4.2  CL 108 107 109 108  CO2 22 23 23 25   GLUCOSE 82 83 91 91  BUN 20 18 16 15   CREATININE 1.02* 0.88 1.04* 0.98  CALCIUM 8.5* 8.5* 8.4* 8.2*  MG  --  1.3* 1.5* 1.8  PHOS  --  3.2  --   --    Liver Function Tests: Recent Labs  Lab 08/20/20 2213 08/21/20 0643  AST 19 18  ALT 17 17  ALKPHOS 53 46  BILITOT 0.2* 0.4  PROT 6.7 5.6*  ALBUMIN 2.9* 2.4*   No results for input(s): LIPASE, AMYLASE in the last 168 hours. No results for input(s): AMMONIA in the last 168 hours. CBC: Recent Labs  Lab 08/20/20 2030 08/21/20 0643 08/22/20 0407 08/23/20 0606  WBC 7.3 5.7 5.0 5.7  NEUTROABS 6.0  --   --   --   HGB 10.8* 9.6* 9.0* 9.2*  HCT 36.8 32.0* 30.5* 31.7*  MCV 90.0 89.4 89.7 90.1  PLT 289 221 219 217   Cardiac Enzymes: No results for input(s): CKTOTAL, CKMB, CKMBINDEX, TROPONINI in the last 168 hours. BNP: Invalid input(s): POCBNP CBG: Recent Labs  Lab 08/22/20 2142 08/23/20 0742 08/23/20 1122 08/23/20 1655 08/23/20 2100  GLUCAP 142* 85 94 88 145*   D-Dimer No results for input(s): DDIMER in the last 72 hours. Hgb A1c No results for input(s): HGBA1C in the last 72 hours. Lipid Profile No results for input(s): CHOL, HDL, LDLCALC, TRIG, CHOLHDL, LDLDIRECT in the last 72 hours. Thyroid function studies No results for input(s): TSH, T4TOTAL, T3FREE, THYROIDAB in the last 72 hours.  Invalid input(s): FREET3 Anemia work up No results for input(s): VITAMINB12, FOLATE, FERRITIN, TIBC, IRON, RETICCTPCT in the last 72 hours. Urinalysis    Component Value Date/Time   COLORURINE  STRAW (A) 08/20/2020 2231   APPEARANCEUR HAZY (A) 08/20/2020 2231   LABSPEC 1.009 08/20/2020 2231   PHURINE 5.0 08/20/2020 2231   GLUCOSEU NEGATIVE 08/20/2020 2231   HGBUR NEGATIVE 08/20/2020 2231   BILIRUBINUR NEGATIVE 08/20/2020 2231    KETONESUR 5 (A) 08/20/2020 2231   PROTEINUR NEGATIVE 08/20/2020 2231   UROBILINOGEN 0.2 12/25/2014 1248   NITRITE NEGATIVE 08/20/2020 2231   LEUKOCYTESUR TRACE (A) 08/20/2020 2231   Sepsis Labs Invalid input(s): PROCALCITONIN,  WBC,  LACTICIDVEN Microbiology Recent Results (from the past 240 hour(s))  Blood Culture (routine x 2)     Status: None (Preliminary result)   Collection Time: 08/20/20  8:30 PM   Specimen: BLOOD LEFT ARM  Result Value Ref Range Status   Specimen Description BLOOD LEFT ARM  Final   Special Requests   Final    BOTTLES DRAWN AEROBIC AND ANAEROBIC Blood Culture adequate volume   Culture   Final    NO GROWTH 2 DAYS Performed at [facility], [address] [place],  [address]    Report Status PENDING  Incomplete  Resp Panel by RT-PCR (Flu A&B, Covid) Nasopharyngeal Swab     Status: None   Collection Time: 08/20/20  8:42 PM   Specimen: Nasopharyngeal Swab; Nasopharyngeal(NP) swabs in vial transport medium  Result Value Ref Range Status   SARS Coronavirus 2 by RT PCR NEGATIVE NEGATIVE Final    Comment: (NOTE) SARS-CoV-2 target nucleic acids are NOT DETECTED.  The SARS-CoV-2 RNA is generally detectable in upper respiratory specimens during the acute phase of infection. The lowest concentration of SARS-CoV-2 viral copies this assay can detect is 138 copies/mL. A negative result does not preclude SARS-Cov-2 infection and should not be used as the sole basis for treatment or other patient management decisions. A negative result may occur with  improper specimen collection/handling, submission of specimen other than nasopharyngeal swab, presence of viral mutation(s) within the areas targeted by this assay, and inadequate number of viral copies(<138 copies/mL). A negative result must be combined with clinical observations, patient history, and epidemiological information. The expected result is Negative.  Fact Sheet for Patients:   EntrepreneurPulse.com.au  Fact Sheet for Healthcare Providers:  IncredibleEmployment.be  This test is no t yet approved or cleared by the [place] FDA and  has been authorized for detection and/or diagnosis of SARS-CoV-2 by FDA under an Emergency Use Authorization (EUA). This EUA will remain  in effect (meaning this test can be used) for the duration of the COVID-19 declaration under Section 564(b)(1) of the Act, 21 U.S.C.section 360bbb-3(b)(1), unless the authorization is terminated  or revoked sooner.       Influenza A by PCR NEGATIVE NEGATIVE Final   Influenza B by PCR NEGATIVE NEGATIVE Final    Comment: (NOTE) The Xpert Xpress SARS-CoV-2/FLU/RSV plus assay is intended as an aid in the diagnosis of influenza from Nasopharyngeal swab specimens and should not be used as a sole basis for treatment. Nasal washings and aspirates are unacceptable for Xpert Xpress SARS-CoV-2/FLU/RSV testing.  Fact Sheet for Patients: EntrepreneurPulse.com.au  Fact Sheet for Healthcare Providers: IncredibleEmployment.be  This test is not yet approved or cleared by the [place] FDA and has been authorized for detection and/or diagnosis of SARS-CoV-2 by FDA under an Emergency Use Authorization (EUA). This EUA will remain in effect (meaning this test can be used) for the duration of the COVID-19 declaration under Section 564(b)(1) of the Act, 21 U.S.C. section 360bbb-3(b)(1), unless the authorization is terminated or revoked.  Performed  at [facility], [address] [place], [place] [address]   Blood Culture (routine x 2)     Status: None (Preliminary result)   Collection Time: 08/20/20  8:42 PM   Specimen: Right Antecubital; Blood  Result Value Ref Range Status   Specimen Description RIGHT ANTECUBITAL  Final   Special Requests   Final    BOTTLES DRAWN AEROBIC AND ANAEROBIC Blood Culture adequate volume    Culture   Final    NO GROWTH 2 DAYS Performed at [facility], [address], [place], [place] [address]    Report Status PENDING  Incomplete  Urine culture     Status: Abnormal   Collection Time: 08/20/20 10:31 PM   Specimen: Urine, Clean Catch  Result Value Ref Range Status   Specimen Description   Final    URINE, CLEAN CATCH Performed at [facility], [address] [place], [place] [address]    Special Requests   Final    NONE Performed at [facility], [address] [place], [place] [address]    Culture MULTIPLE SPECIES PRESENT, SUGGEST RECOLLECTION (A)  Final   Report Status 08/22/2020 FINAL  Final  MRSA Next Gen by PCR, Nasal     Status: None   Collection Time: 08/21/20  5:18 AM   Specimen: Nasal Mucosa; Nasal Swab  Result Value Ref Range Status   MRSA by PCR Next Gen NOT DETECTED NOT DETECTED Final    Comment: (NOTE) The GeneXpert MRSA Assay (FDA approved for NASAL specimens only), is one component of a comprehensive MRSA colonization surveillance program. It is not intended to diagnose MRSA infection nor to guide or monitor treatment for MRSA infections. Test performance is not FDA approved in patients less than 69 years old. Performed at [facility], [address], [place],  [address]      Time coordinating discharge: 35 minutes  SIGNED:   Rodena Goldmann, DO [facility] 08/24/2020, 10:58 AM  If 7PM-7AM, please contact night-coverage [email or web address]

## 2020-08-24 NOTE — TOC Transition Note (Signed)
Transition of Care [facility]) - CM/SW Discharge Note   Patient Details  Name: [name] MRN: [id number] Date of Birth: [birth year]  [facility] CM/SW Contact:  Shade Flood, LCSW Phone Number: 08/24/2020, 12:08 PM   Clinical Narrative:     Pt stable to return to [facility] today per MD. Updated Jackelyn Poling at [facility] and they can take pt back today. Updated pt's son who is in agreement with dc plan. Arranged EMS. DC clinical sent electronically. RN to call report.   There are no other TOC needs for dc.  Final next level of care: Long Term Nursing Home Barriers to Discharge: Barriers Resolved   Patient Goals and CMS Choice        Discharge Placement                       Discharge Plan and Services                                     Social Determinants of Health (SDOH) Interventions     Readmission Risk Interventions No flowsheet data found.

## 2020-08-24 NOTE — Progress Notes (Signed)
Report called to [facility], awaiting transport.

## 2020-08-24 NOTE — ED Notes (Signed)
Dr Quinn Axe re paged to Dr Sabra Heck @ [phone]

## 2020-08-25 ENCOUNTER — Inpatient Hospital Stay (HOSPITAL_COMMUNITY)
Admit: 2020-08-25 | Discharge: 2020-08-25 | Disposition: A | Discharge status: Home / Self Care | Payer: Medicare (Managed Care) | Attending: Internal Medicine | Admitting: Internal Medicine

## 2020-08-25 DIAGNOSIS — G9340 Encephalopathy, unspecified: Secondary | ICD-10-CM | POA: Diagnosis not present

## 2020-08-25 DIAGNOSIS — I10 Essential (primary) hypertension: Secondary | ICD-10-CM | POA: Diagnosis not present

## 2020-08-25 LAB — CULTURE, BLOOD (ROUTINE X 2)
Culture: NO GROWTH
Culture: NO GROWTH
Special Requests: ADEQUATE
Special Requests: ADEQUATE

## 2020-08-25 LAB — CBG MONITORING, ED
Glucose-Capillary: 87 mg/dL (ref 70–99)
Glucose-Capillary: 83 mg/dL (ref 70–99)
Glucose-Capillary: 86 mg/dL (ref 70–99)
Glucose-Capillary: 85 mg/dL (ref 70–99)
Glucose-Capillary: 94 mg/dL (ref 70–99)

## 2020-08-25 LAB — BASIC METABOLIC PANEL
Anion gap: 8 (ref 5–15)
BUN: 15 mg/dL (ref 8–23)
CO2: 24 mmol/L (ref 22–32)
Calcium: 8.5 mg/dL — ABNORMAL LOW (ref 8.9–10.3)
Chloride: 112 mmol/L — ABNORMAL HIGH (ref 98–111)
Creatinine, Ser: 1.02 mg/dL — ABNORMAL HIGH (ref 0.44–1.00)
GFR, Estimated: 59 mL/min — ABNORMAL LOW (ref >60)
Glucose, Bld: 85 mg/dL (ref 70–99)
Potassium: 3.7 mmol/L (ref 3.5–5.1)
Sodium: 144 mmol/L (ref 135–145)

## 2020-08-25 LAB — GLUCOSE, CAPILLARY: Glucose-Capillary: 123 mg/dL — ABNORMAL HIGH (ref 70–99)

## 2020-08-25 MED ORDER — DEXTROSE-NACL 5-0.45 % IV SOLN
INTRAVENOUS | Status: DC
  Administered 2020-08-25: 1000 mL via INTRAVENOUS

## 2020-08-25 MED ORDER — INSULIN ASPART 100 UNIT/ML IJ SOLN
0.0000 [IU] | INTRAMUSCULAR | Status: DC
  Administered 2020-08-25 – 2020-08-26 (×3): 2 [IU] via SUBCUTANEOUS
  Administered 2020-08-27 (×3): 3 [IU] via SUBCUTANEOUS
  Administered 2020-08-27: 2 [IU] via SUBCUTANEOUS
  Administered 2020-08-28: 3 [IU] via SUBCUTANEOUS

## 2020-08-25 NOTE — Progress Notes (Signed)
EEG complete - results pending 

## 2020-08-25 NOTE — Progress Notes (Addendum)
PROGRESS NOTE    [name]  HER:[id number] DOB: 02-18-1951 DOA: 08/24/2020 PCP: Caprice Renshaw, MD   Brief Narrative:   [name] is a 70 y.o. female with medical history significant of prior PE with current Xarelto, type 2 diabetes, hypertension, recurrent cellulitis, prior TIA on 03/2015, prior stroke (07/24/2018) and chronic ambulatory dysfunction-wheelchair-bound who presented to the ED from [facility] due to altered mental status.  She was just discharged to [facility] on 7/5 after significant improvement in her mentation with return to baseline after treatment of UTI with cefepime.  She apparently arrived to her SNF and was noted to be awake, but could not follow any commands or respond to external stimuli.  She was returned back to the ED for further evaluation.  CT head currently negative and patient appears to be more awake and alert this AM.  She is responding to questioning appropriately.  Assessment & Plan:   Active Problems:   Type 2 diabetes mellitus (HCC)   Essential hypertension, benign   Morbid obesity (HCC)   Encephalopathy acute   Acute encephalopathy -Question the possibility of seizure given sudden change in mentation after recent admission with significant improvement -She was also noted to be obtunded on arrival which could indicate she was postictal  -Plan to transfer to [facility] per neurology recommendations for inpatient monitoring and EEG -CT head in ED with no acute findings -Recent UTI with treatment on IV antibiotics completed  Type 2 diabetes -Last A1c 08/21/2020 5.4% -Continue SSI -Hold home metformin  Hypertension -Currently controlled, continuing home medications  History of CVA -Continue aspirin, Xarelto, and statin   History of PE -Continue Xarelto  Chronic ambulatory dysfunction -Back to SNF on discharge   Stage II sacral ulcers-present on admission -Wound care as appropriate   Anorexia likely related to depression -Loss of husband  approximately 4-6 months ago -Trial of mirtazapine started on dc 7/5  Morbid obesity -Lifestyle changes outpatient   DVT prophylaxis:Xarelto Code Status: DNR Family Communication: Discussed with son on admission who is aware of the plan Disposition Plan:  Status is: Inpatient  Remains inpatient appropriate because:Altered mental status, IV treatments appropriate due to intensity of illness or inability to take PO, and Inpatient level of care appropriate due to severity of illness  Dispo: The patient is from: SNF              Anticipated d/c is to: SNF              Patient currently is not medically stable to d/c.   Difficult to place patient No   Nutritional Assessment:  The patient's BMI is: Body mass index is 44.72 kg/m.Marland Kitchen  Seen by dietician.  I agree with the assessment and plan as outlined below:  Skin Assessment:  I have examined the patient's skin and I agree with the wound assessment as performed by the wound care RN as outlined below:  Pressure Injury 08/21/20 Buttocks Left Stage 2 -  Partial thickness loss of dermis presenting as a shallow open injury with a red, pink wound bed without slough. (Active)  08/21/20 0212  Location: Buttocks  Location Orientation: Left  Staging: Stage 2 -  Partial thickness loss of dermis presenting as a shallow open injury with a red, pink wound bed without slough.  Wound Description (Comments):   Present on Admission: Yes     Pressure Injury 08/21/20 Sacrum Medial Stage 2 -  Partial thickness loss of dermis presenting as a shallow open  injury with a red, pink wound bed without slough. (Active)  08/21/20 0215  Location: Sacrum  Location Orientation: Medial  Staging: Stage 2 -  Partial thickness loss of dermis presenting as a shallow open injury with a red, pink wound bed without slough.  Wound Description (Comments):   Present on Admission: Yes    Consultants:  Neurology by EDP  Procedures:  See below  Antimicrobials:   None   Subjective: Patient seen and evaluated today with no new acute complaints or concerns. No acute concerns or events noted overnight.  She is awake and alert and responds to questions appropriately.  She understands the need to go to [facility] for neurology evaluation.  Objective: Vitals:   08/25/20 0730 08/25/20 0800 08/25/20 0830 08/25/20 0900  BP: (!) 151/50 (!) 158/59 (!) 133/44 (!) 150/65  Pulse: 67 67 89 75  Resp: 13 14 (!) 25 14  Temp:      TempSrc:      SpO2: 100% 100% 100% 100%  Weight:      Height:        Intake/Output Summary (Last 24 hours) at 08/25/2020 8841 Last data filed at 08/24/2020 1946 Gross per 24 hour  Intake --  Output 80 ml  Net -80 ml   Filed Weights   08/24/20 1838  Weight: 110.9 kg    Examination:  General exam: Appears calm and comfortable, morbidly obese Respiratory system: Clear to auscultation. Respiratory effort normal.  Currently on 3.5 L nasal cannula oxygen Cardiovascular system: S1 & S2 heard, RRR.  Gastrointestinal system: Abdomen is soft Central nervous system: Alert and awake Extremities: No edema Skin: No significant lesions noted Psychiatry: Flat affect.    Data Reviewed: I have personally reviewed following labs and imaging studies  CBC: Recent Labs  Lab 08/20/20 2030 08/21/20 0643 08/22/20 0407 08/23/20 0606 08/24/20 1838  WBC 7.3 5.7 5.0 5.7 7.1  NEUTROABS 6.0  --   --   --  4.1  HGB 10.8* 9.6* 9.0* 9.2* 10.7*  HCT 36.8 32.0* 30.5* 31.7* 35.7*  MCV 90.0 89.4 89.7 90.1 87.9  PLT 289 221 219 217 660   Basic Metabolic Panel: Recent Labs  Lab 08/21/20 0643 08/22/20 0407 08/23/20 0606 08/24/20 1838 08/25/20 0323  NA 139 139 139 142 144  K 4.7 4.2 4.2 3.9 3.7  CL 107 109 108 110 112*  CO2 23 23 25 23 24   GLUCOSE 83 91 91 102* 85  BUN 18 16 15 15 15   CREATININE 0.88 1.04* 0.98 1.09* 1.02*  CALCIUM 8.5* 8.4* 8.2* 8.6* 8.5*  MG 1.3* 1.5* 1.8  --   --   PHOS 3.2  --   --   --   --    GFR: Estimated  Creatinine Clearance: 60.3 mL/min (A) (by C-G formula based on SCr of 1.02 mg/dL (H)). Liver Function Tests: Recent Labs  Lab 08/20/20 2213 08/21/20 0643 08/24/20 1838  AST 19 18 22   ALT 17 17 23   ALKPHOS 53 46 54  BILITOT 0.2* 0.4 0.4  PROT 6.7 5.6* 6.3*  ALBUMIN 2.9* 2.4* 2.6*   No results for input(s): LIPASE, AMYLASE in the last 168 hours. Recent Labs  Lab 08/24/20 1838  AMMONIA 10   Coagulation Profile: Recent Labs  Lab 08/21/20 0643  INR 1.1   Cardiac Enzymes: No results for input(s): CKTOTAL, CKMB, CKMBINDEX, TROPONINI in the last 168 hours. BNP (last 3 results) No results for input(s): PROBNP in the last 8760 hours. HbA1C: No results for input(s):  HGBA1C in the last 72 hours. CBG: Recent Labs  Lab 08/23/20 2100 08/24/20 0737 08/24/20 1152 08/24/20 2358 08/25/20 0859  GLUCAP 145* 80 93 83 87   Lipid Profile: No results for input(s): CHOL, HDL, LDLCALC, TRIG, CHOLHDL, LDLDIRECT in the last 72 hours. Thyroid Function Tests: No results for input(s): TSH, T4TOTAL, FREET4, T3FREE, THYROIDAB in the last 72 hours. Anemia Panel: No results for input(s): VITAMINB12, FOLATE, FERRITIN, TIBC, IRON, RETICCTPCT in the last 72 hours. Sepsis Labs: Recent Labs  Lab 08/20/20 2030 08/20/20 2214 08/24/20 1838  LATICACIDVEN 1.3 1.9 1.2    Recent Results (from the past 240 hour(s))  Blood Culture (routine x 2)     Status: None   Collection Time: 08/20/20  8:30 PM   Specimen: BLOOD LEFT ARM  Result Value Ref Range Status   Specimen Description BLOOD LEFT ARM  Final   Special Requests   Final    BOTTLES DRAWN AEROBIC AND ANAEROBIC Blood Culture adequate volume   Culture   Final    NO GROWTH 5 DAYS Performed at [facility], [address], [place], [place] [address]    Report Status 08/25/2020 FINAL  Final  Resp Panel by RT-PCR (Flu A&B, Covid) Nasopharyngeal Swab     Status: None   Collection Time: 08/20/20  8:42 PM   Specimen: Nasopharyngeal Swab;  Nasopharyngeal(NP) swabs in vial transport medium  Result Value Ref Range Status   SARS Coronavirus 2 by RT PCR NEGATIVE NEGATIVE Final    Comment: (NOTE) SARS-CoV-2 target nucleic acids are NOT DETECTED.  The SARS-CoV-2 RNA is generally detectable in upper respiratory specimens during the acute phase of infection. The lowest concentration of SARS-CoV-2 viral copies this assay can detect is 138 copies/mL. A negative result does not preclude SARS-Cov-2 infection and should not be used as the sole basis for treatment or other patient management decisions. A negative result may occur with  improper specimen collection/handling, submission of specimen other than nasopharyngeal swab, presence of viral mutation(s) within the areas targeted by this assay, and inadequate number of viral copies(<138 copies/mL). A negative result must be combined with clinical observations, patient history, and epidemiological information. The expected result is Negative.  Fact Sheet for Patients:  EntrepreneurPulse.com.au  Fact Sheet for Healthcare Providers:  IncredibleEmployment.be  This test is no t yet approved or cleared by the [place] FDA and  has been authorized for detection and/or diagnosis of SARS-CoV-2 by FDA under an Emergency Use Authorization (EUA). This EUA will remain  in effect (meaning this test can be used) for the duration of the COVID-19 declaration under Section 564(b)(1) of the Act, 21 U.S.C.section 360bbb-3(b)(1), unless the authorization is terminated  or revoked sooner.       Influenza A by PCR NEGATIVE NEGATIVE Final   Influenza B by PCR NEGATIVE NEGATIVE Final    Comment: (NOTE) The Xpert Xpress SARS-CoV-2/FLU/RSV plus assay is intended as an aid in the diagnosis of influenza from Nasopharyngeal swab specimens and should not be used as a sole basis for treatment. Nasal washings and aspirates are unacceptable for Xpert Xpress  SARS-CoV-2/FLU/RSV testing.  Fact Sheet for Patients: EntrepreneurPulse.com.au  Fact Sheet for Healthcare Providers: IncredibleEmployment.be  This test is not yet approved or cleared by the [place] FDA and has been authorized for detection and/or diagnosis of SARS-CoV-2 by FDA under an Emergency Use Authorization (EUA). This EUA will remain in effect (meaning this test can be used) for the duration of the COVID-19 declaration under Section 564(b)(1) of  the Act, 21 U.S.C. section 360bbb-3(b)(1), unless the authorization is terminated or revoked.  Performed at [facility], [address], [place], [place] [address]   Blood Culture (routine x 2)     Status: None   Collection Time: 08/20/20  8:42 PM   Specimen: Right Antecubital; Blood  Result Value Ref Range Status   Specimen Description RIGHT ANTECUBITAL  Final   Special Requests   Final    BOTTLES DRAWN AEROBIC AND ANAEROBIC Blood Culture adequate volume   Culture   Final    NO GROWTH 5 DAYS Performed at [facility], [address], [place], [place] [address]    Report Status 08/25/2020 FINAL  Final  Urine culture     Status: Abnormal   Collection Time: 08/20/20 10:31 PM   Specimen: Urine, Clean Catch  Result Value Ref Range Status   Specimen Description   Final    URINE, CLEAN CATCH Performed at [facility], [address], [place], [place] [address]    Special Requests   Final    NONE Performed at [facility], [address], [place], [place] [address]    Culture MULTIPLE SPECIES PRESENT, SUGGEST RECOLLECTION (A)  Final   Report Status 08/22/2020 FINAL  Final  MRSA Next Gen by PCR, Nasal     Status: None   Collection Time: 08/21/20  5:18 AM   Specimen: Nasal Mucosa; Nasal Swab  Result Value Ref Range Status   MRSA by PCR Next Gen NOT DETECTED NOT DETECTED Final    Comment: (NOTE) The GeneXpert MRSA Assay (FDA approved for NASAL specimens only), is one component of a  comprehensive MRSA colonization surveillance program. It is not intended to diagnose MRSA infection nor to guide or monitor treatment for MRSA infections. Test performance is not FDA approved in patients less than 65 years old. Performed at [facility], [address] [place], [place] [address]          Radiology Studies: CT Head Wo Contrast  Result Date: 08/24/2020 CLINICAL DATA:  Altered mental status EXAM: CT HEAD WITHOUT CONTRAST TECHNIQUE: Contiguous axial images were obtained from the base of the skull through the vertex without intravenous contrast. COMPARISON:  MR brain dated 07/20/2020 FINDINGS: Brain: No evidence of acute infarction, hemorrhage, hydrocephalus, extra-axial collection or mass lesion/mass effect. Old left basal ganglia lacunar infarct. Subcortical white matter and periventricular small vessel ischemic changes. Vascular: Intracranial atherosclerosis. Skull: Normal. Negative for fracture or focal lesion. Sinuses/Orbits: The visualized paranasal sinuses are essentially clear. The mastoid air cells are unopacified. Other: None. IMPRESSION: No evidence of acute intracranial abnormality. Old left basal ganglia lacunar infarct. Small vessel ischemic changes. Electronically Signed   By: Julian Hy M.D.   On: 08/24/2020 19:32        Scheduled Meds:  ammonium lactate   Topical BID   vitamin C  500 mg Oral Daily   aspirin  81 mg Oral Daily   atorvastatin  40 mg Oral Daily   cholecalciferol  1,000 Units Oral Daily   feeding supplement (GLUCERNA SHAKE)  237 mL Oral TID BM   ferrous sulfate  325 mg Oral Q breakfast   fluticasone  1 spray Each Nare Daily   gabapentin  100 mg Oral TID   insulin aspart  0-20 Units Subcutaneous TID WC   lisinopril  15 mg Oral Daily   metFORMIN  1,000 mg Oral Q breakfast   mirtazapine  7.5 mg Oral QHS   rivaroxaban  20 mg Oral Q supper  verapamil  180 mg Oral Daily   Continuous Infusions:  sodium chloride 50 mL/hr at 08/25/20 0618      LOS: 1 day    Time spent: 35 minutes    Dayana Dalporto D Manuella Ghazi, DO [facility]  If 7PM-7AM, please contact night-coverage [email or web address] 08/25/2020, 9:22 AM

## 2020-08-25 NOTE — Progress Notes (Signed)
SLP Cancellation Note  Patient Details Name: [name] MRN: [id number] DOB: [birth year]   Cancelled treatment:        Attempted to complete BSE, however, despite Pt being awake, she was not able to verbally respond or follow commands. She did state her first name, but no other verbal responses were noted. She would not/could not open her mouth upon command and Pt sealed lips upon attempted presentation of a single ice chip to labial surface. Recommend continue NPO pending completion of BSE; ST will continue efforts and will complete BSE when Pt is appropriate for PO trials. Thank you,  Kayley Zeiders H. Roddie Mc, [place] Speech Language Pathologist  Wende Bushy 08/25/2020, 11:23 AM

## 2020-08-25 NOTE — Procedures (Signed)
Patient Name: [name]  MRN: [id number]  Epilepsy Attending: Lora Havens  Referring Physician/Provider: Dr Heath Lark Date: 08/25/2020 Duration: 22.33 mins  Patient history: 69 year old female with acute encephalopathy.  EEG evaluate for seizures.  Level of alertness: lethargic   AEDs during EEG study: Gabapentin  Technical aspects: This EEG study was done with scalp electrodes positioned according to the 10-20 International system of electrode placement. Electrical activity was acquired at a sampling rate of 500Hz  and reviewed with a high frequency filter of 70Hz  and a low frequency filter of 1Hz . EEG data were recorded continuously and digitally stored.   Description: No clear posterior dominant rhythm was seen. EEG showed continuous generalized polymorphic sharply contoured 3 to 5 Hz theta-delta slowing, at times with triphasic morphology. Hyperventilation and photic stimulation were not performed.     ABNORMALITY - Continuous slow, generalized  IMPRESSION: This study is suggestive of moderate to severe diffuse encephalopathy, nonspecific etiology but could be secondary to toxic, metabolic etiology. No seizures or definite epileptiform discharges were seen throughout the recording.  [name]

## 2020-08-25 NOTE — Plan of Care (Signed)
°  Problem: Clinical Measurements: °Goal: Ability to maintain clinical measurements within normal limits will improve °Outcome: Progressing °Goal: Will remain free from infection °Outcome: Progressing °Goal: Diagnostic test results will improve °Outcome: Progressing °Goal: Respiratory complications will improve °Outcome: Progressing °Goal: Cardiovascular complication will be avoided °Outcome: Progressing °  °Problem: Activity: °Goal: Risk for activity intolerance will decrease °Outcome: Progressing °  °Problem: Nutrition: °Goal: Adequate nutrition will be maintained °Outcome: Progressing °  °Problem: Coping: °Goal: Level of anxiety will decrease °Outcome: Progressing °  °Problem: Elimination: °Goal: Will not experience complications related to bowel motility °Outcome: Progressing °Goal: Will not experience complications related to urinary retention °Outcome: Progressing °  °Problem: Pain Managment: °Goal: General experience of comfort will improve °Outcome: Progressing °  °Problem: Safety: °Goal: Ability to remain free from injury will improve °Outcome: Progressing °  °

## 2020-08-25 NOTE — Consult Note (Signed)
NEUROLOGY CONSULTATION NOTE   Date of service: August 25, 2020 Patient Name: [name] MRN:  [id number] DOB:  1950/05/01 Reason for consult: "Altered Mental Status" Requesting Provider: Neena Rhymes, MD _ _ _   _ __   _ __ _ _  __ __   _ __   __ _  History of Present Illness  [name] is a 70 y.o. female with PMH significant for 70 y.o. female with medical history significant of prior PE with current Xarelto, type 2 diabetes, hypertension, recurrent cellulitis, prior TIA on 03/2015, prior stroke (07/24/2018) and chronic ambulatory dysfunction-wheelchair bound who presented to the ED due to unable to talk.  She was recently admitted for UTI and treated with Cefepime. She was discharged to he rSNF after improving mentation and was sent back the same day for inability to talk.  On my evaluation, she is awake, alert. voice is soft, whispery, requires a lot of encouragement to get her to participate with exam. Appears withdrawn and looking around the room but hardly makes eye contact.  She reports that she could not talk at her facility. Does endorse unable to focus. She becomes more interactive and talkative later in the exam.   She adamantly believes that I am a psychiatrist and refuses to talk to me further. I showed her my badge which has my name, photo and title but she still thinks I am a psychiatrist and would not talk to me.  Denies any history of seizures but would not talk to me anymore.   ROS   Unable to get detailed ROS as she initially appeared withdrawn, later more interactive but refused to talk to me.  Past History   Past Medical History:  Diagnosis Date  . Cellulitis lower extremities   4 day hospitalization 12/25/14-12/29/14  . Essential hypertension, benign   . History of MRSA infection   . History of panniculitis   . Pulmonary emboli ([place]) 2009  . Skin ulcer of thigh ([place])   . Type 2 diabetes mellitus ([place])    Past Surgical History:  Procedure Laterality  Date  . PARTIAL HYSTERECTOMY    . Right thigh ulcer debridement  2008   Two occasions - wound VAC   Family History  Problem Relation Age of Onset  . Cancer - Colon Father   . Heart disease Mother   . Diabetes Mellitus II Mother    Social History   Socioeconomic History  . Marital status: Married    Spouse name: Not on file  . Number of children: Not on file  . Years of education: Not on file  . Highest education level: Not on file  Occupational History  . Not on file  Tobacco Use  . Smoking status: Never  . Smokeless tobacco: Never  Vaping Use  . Vaping Use: Never used  Substance and Sexual Activity  . Alcohol use: No  . Drug use: No  . Sexual activity: Not on file  Other Topics Concern  . Not on file  Social History Narrative  . Not on file   Social Determinants of Health   Financial Resource Strain: Not on file  Food Insecurity: Not on file  Transportation Needs: Not on file  Physical Activity: Not on file  Stress: Not on file  Social Connections: Not on file   Allergies  Allergen Reactions  . Vancomycin Hives and Rash    "big red bumps, then they turned white, then I shedded like a snake."  Medications   Medications Prior to Admission  Medication Sig Dispense Refill Last Dose  . ammonium lactate (LAC-HYDRIN) 12 % lotion Apply topically 2 (two) times daily. 400 g 0 08/24/2020  . aspirin 81 MG chewable tablet Chew 1 tablet (81 mg total) by mouth daily.   08/24/2020 at 0800  . atorvastatin (LIPITOR) 40 MG tablet Take 40 mg by mouth daily.   Past Week  . cholecalciferol (VITAMIN D3) 25 MCG (1000 UNIT) tablet Take 1,000 Units by mouth daily.   08/24/2020  . feeding supplement, GLUCERNA SHAKE, (GLUCERNA SHAKE) LIQD Take 237 mLs by mouth 3 (three) times daily between meals. 237 mL 0 08/24/2020  . ferrous sulfate 325 (65 FE) MG tablet Take 325 mg by mouth daily with breakfast.   08/24/2020  . fluticasone (FLONASE) 50 MCG/ACT nasal spray Place 1 spray into both nostrils  daily.   08/24/2020  . gabapentin (NEURONTIN) 100 MG capsule Take 100 mg by mouth 3 (three) times daily.   08/24/2020  . lisinopril (ZESTRIL) 30 MG tablet Take 15 mg by mouth daily.   08/24/2020  . metFORMIN (GLUCOPHAGE) 1000 MG tablet Take 1 tablet by mouth 2 (two) times a day.   08/24/2020  . mirtazapine (REMERON) 7.5 MG tablet Take 1 tablet (7.5 mg total) by mouth at bedtime. 30 tablet 0 Past Week  . oxyCODONE-acetaminophen (PERCOCET/ROXICET) 5-325 MG tablet Take 1 tablet by mouth 4 (four) times daily. 10 tablet 0   . senna (SENOKOT) 8.6 MG TABS tablet Take 1 tablet (8.6 mg total) by mouth daily as needed for mild constipation. 120 each 0 08/24/2020  . verapamil (CALAN-SR) 180 MG CR tablet Take 180 mg by mouth daily.   08/24/2020  . vitamin C (ASCORBIC ACID) 500 MG tablet Take 500 mg by mouth daily.   08/24/2020  . XARELTO 20 MG TABS Take 20 mg by mouth daily with supper.    08/23/2020 at [place]:   08/25/20 1630 08/25/20 1900 08/25/20 1930 08/25/20 2115  BP: (!) 158/75 (!) 149/36 (!) 150/61 (!) 150/91  Pulse: 75 76 81 80  Resp: 15 18 16 17   Temp:    98.7 F (37.1 C)  TempSrc:    Oral  SpO2: 100% 100% 100% 97%  Weight:      Height:         Body mass index is 44.72 kg/m.  Physical Exam   General: Laying comfortably in bed; in no acute distress. HENT: Normal oropharynx and mucosa. Normal external appearance of ears and nose. Neck: Supple, no pain or tenderness CV: No JVD. No peripheral edema.  Pulmonary: Symmetric Chest rise. Normal respiratory effort.  Abdomen: Soft to touch, non-tender.  Ext: No cyanosis, edema, or deformity  Skin: No rash. Normal palpation of skin.   Musculoskeletal: Normal digits and nails by inspection. No clubbing.   Neurologic Examination  Mental status/Cognition: Alert, oriented to self, place, but not to month and year Speech/language: Soft, whispery, fluent, comprehension intact, object naming intact, repetition intact. Cranial nerves:   CN  II Pupils equal and reactive to light, no VF deficits   CN III,IV,VI EOM intact, no gaze preference or deviation, no nystagmus   CN V normal sensation in V1, V2, and V3 segments bilaterally   CN VII no asymmetry, no nasolabial fold flattening   CN VIII normal hearing to speech   CN IX & X normal palatal elevation, no uvular deviation   CN XI 5/5 head turn and  5/5 shoulder shrug bilaterally   CN XII midline tongue protrusion   Motor:  Muscle bulk: poor, tone normal, pronator drift none Mvmt Root Nerve  Muscle Right Left Comments  SA C5/6 Ax Deltoid 4 4   EF C5/6 Mc Biceps 4 4   EE C6/7/8 Rad Triceps 4 4   WF C6/7 Med FCR     WE C7/8 PIN ECU     F Ab C8/T1 U ADM/FDI 4 4   HF L1/2/3 Fem Illopsoas 2 2 Chronically weak in BL lower extremities and in wheelchair for 6+ years per patient.  KE L2/3/4 Fem Quad 2 2   DF L4/5 D Peron Tib Ant 3 3   PF S1/2 Tibial Grc/Sol 3 3    Reflexes:  Right Left Comments  Pectoralis      Biceps (C5/6) 2 2   Brachioradialis (C5/6) 2 2    Triceps (C6/7) 2 2    Patellar (L3/4) 0 0    Achilles (S1) 0 0    Hoffman      Plantar     Jaw jerk    Sensation:  Light touch Intact throughout   Pin prick    Temperature    Vibration   Proprioception    Coordination/Complex Motor:  - Finger to Nose intact BL - Heel to shin unable to do - Rapid alternating movement are slowed. - Gait: unable to assess, she is wheelchair bound at baseline.  Labs   CBC:  Recent Labs  Lab 08/20/20 2030 08/21/20 0643 08/23/20 0606 08/24/20 1838  WBC 7.3   < > 5.7 7.1  NEUTROABS 6.0  --   --  4.1  HGB 10.8*   < > 9.2* 10.7*  HCT 36.8   < > 31.7* 35.7*  MCV 90.0   < > 90.1 87.9  PLT 289   < > 217 277   < > = values in this interval not displayed.    Basic Metabolic Panel:  Lab Results  Component Value Date   NA 144 08/25/2020   K 3.7 08/25/2020   CO2 24 08/25/2020   GLUCOSE 85 08/25/2020   BUN 15 08/25/2020   CREATININE 1.02 (H) 08/25/2020   CALCIUM 8.5 (L)  08/25/2020   GFRNONAA 59 (L) 08/25/2020   GFRAA 50 (L) 07/25/2018   Lipid Panel:  Lab Results  Component Value Date   LDLCALC 53 07/25/2018   HgbA1c:  Lab Results  Component Value Date   HGBA1C 5.4 08/21/2020   Urine Drug Screen:     Component Value Date/Time   LABOPIA NONE DETECTED 07/20/2020 1351   COCAINSCRNUR NONE DETECTED 07/20/2020 1351   LABBENZ NONE DETECTED 07/20/2020 1351   AMPHETMU NONE DETECTED 07/20/2020 1351   THCU NONE DETECTED 07/20/2020 1351   LABBARB NONE DETECTED 07/20/2020 1351    Alcohol Level     Component Value Date/Time   ETH <10 07/20/2020 1220    CT Head without contrast(personally reviewed): CTH was negative for a large hypodensity concerning for a large territory infarct or hyperdensity concerning for an ICH  MRI Brain without contrast from 07/20/20:(personally reviewed) 1. No evidence of acute intracranial abnormality. 2. Redemonstrated chronic cortically-based infarct within the mid left frontal lobe/left frontal operculum. 3. Redemonstrated chronic small-vessel infarcts within the left corona radiata, left basal ganglia, left thalamus and left pons. 4. Stable background mild generalized parenchymal atrophy and moderate chronic small vessel ischemic disease, as described. 5. Chronic microhemorrhages within the cerebellum, brainstem and right thalamus. 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  rEEG:  This study is suggestive of moderate to severe diffuse encephalopathy, nonspecific etiology but could be secondary to toxic, metabolic etiology. No seizures or definite epileptiform discharges were seen throughout the recording.  Impression   [name] is a 70 y.o. female with PMH significant for 70 y.o. female with medical history significant of prior PE with current Xarelto, type 2 diabetes, hypertension, recurrent cellulitis, prior TIA on 03/2015, prior stroke (07/24/2018) and chronic ambulatory  dysfunction-wheelchair bound who presented to the ED due to unable to talk.  On my evaluation, she is awake, alert. voice is soft, whispery, requires a lot of encouragement to get her to participate with exam. Appears withdrawn and looking around the room but hardly makes eye contact. Later on my evaluation, speech is clear, fluent, comprehension intact and repetition intact. Reasoning and thought process intact.   She is very paranoid and thinks I am a psychiatrist and then refused to answer any more questions. No prior hsitory of seizures.  Workup with CT Head without contrast with no acute abnormality, recently had an MRI Brain with chronic left frontal infarct and chronic small vessel disease. rEEG with moderate to severe diffuse encephalopathy with no definite epileptiform discharges.  I suspect she has an underlying mood disorder and I think that her inability to talk is more of her being withdrawn rather than aphasia or apraxia. Maybe depression secondary to her medical conditions. I do think that some of her initial encephalopathy may have been due to Cefepime. She just finished her last dose on 08/24/20. This typically takes about 48 hours to completely clear up.  Recommendations  - No further inpatient neurological workup needed at this time. - I do think she will benefit from seeing a psychiatrist. Not sure if there is anything that they can offer inpatient thou. She may not even be willing to talk to them. She does not have any documented psych history and I do not see that she has been evaluated by them in the past. Will defer this to primary team. - Neurology inpatient team will signoff. Please feel free to contact us with any questions or concerns. ______________________________________________________________________   Thank you for the opportunity to take part in the care of this patient. If you have any further questions, please contact the neurology consultation  attending.  Signed,  Bryant Pager Number [phone] _ _ _   _ __   _ __ _ _  __ __   _ __   __ _

## 2020-08-26 ENCOUNTER — Inpatient Hospital Stay (HOSPITAL_COMMUNITY): Payer: Medicare (Managed Care)

## 2020-08-26 DIAGNOSIS — L8990 Pressure ulcer of unspecified site, unspecified stage: Secondary | ICD-10-CM | POA: Insufficient documentation

## 2020-08-26 DIAGNOSIS — I10 Essential (primary) hypertension: Secondary | ICD-10-CM | POA: Diagnosis not present

## 2020-08-26 DIAGNOSIS — R55 Syncope and collapse: Secondary | ICD-10-CM | POA: Diagnosis not present

## 2020-08-26 LAB — BASIC METABOLIC PANEL
Anion gap: 9 (ref 5–15)
BUN: 12 mg/dL (ref 8–23)
CO2: 20 mmol/L — ABNORMAL LOW (ref 22–32)
Calcium: 8.6 mg/dL — ABNORMAL LOW (ref 8.9–10.3)
Chloride: 113 mmol/L — ABNORMAL HIGH (ref 98–111)
Creatinine, Ser: 1.05 mg/dL — ABNORMAL HIGH (ref 0.44–1.00)
GFR, Estimated: 57 mL/min — ABNORMAL LOW (ref >60)
Glucose, Bld: 127 mg/dL — ABNORMAL HIGH (ref 70–99)
Potassium: 3.4 mmol/L — ABNORMAL LOW (ref 3.5–5.1)
Sodium: 142 mmol/L (ref 135–145)

## 2020-08-26 LAB — CBC
HCT: 36.7 % (ref 36.0–46.0)
Hemoglobin: 11 g/dL — ABNORMAL LOW (ref 12.0–15.0)
MCH: 26.1 pg (ref 26.0–34.0)
MCHC: 30 g/dL (ref 30.0–36.0)
MCV: 87 fL (ref 80.0–100.0)
Platelets: 275 10*3/uL (ref 150–400)
RBC: 4.22 MIL/uL (ref 3.87–5.11)
RDW: 16.3 % — ABNORMAL HIGH (ref 11.5–15.5)
WBC: 8.7 10*3/uL (ref 4.0–10.5)
nRBC: 0 % (ref 0.0–0.2)

## 2020-08-26 LAB — GLUCOSE, CAPILLARY
Glucose-Capillary: 125 mg/dL — ABNORMAL HIGH (ref 70–99)
Glucose-Capillary: 114 mg/dL — ABNORMAL HIGH (ref 70–99)
Glucose-Capillary: 137 mg/dL — ABNORMAL HIGH (ref 70–99)
Glucose-Capillary: 82 mg/dL (ref 70–99)
Glucose-Capillary: 120 mg/dL — ABNORMAL HIGH (ref 70–99)

## 2020-08-26 LAB — SARS CORONAVIRUS 2 (TAT 6-24 HRS): SARS Coronavirus 2: NEGATIVE

## 2020-08-26 LAB — MAGNESIUM: Magnesium: 1.5 mg/dL — ABNORMAL LOW (ref 1.7–2.4)

## 2020-08-26 LAB — ECHOCARDIOGRAM COMPLETE
AR max vel: 1.59 cm2
AV Area VTI: 1.41 cm2
AV Area mean vel: 1.46 cm2
AV Mean grad: 10 mmHg
AV Peak grad: 18.3 mmHg
Ao pk vel: 2.14 m/s
Area-P 1/2: 4.39 cm2
Height: 62 in
S' Lateral: 3.1 cm
Weight: 3911.84 oz

## 2020-08-26 LAB — URINE CULTURE: Culture: NO GROWTH

## 2020-08-26 MED ORDER — POTASSIUM CHLORIDE 10 MEQ/100ML IV SOLN
10.0000 meq | Freq: Once | INTRAVENOUS | Status: AC
  Administered 2020-08-26: 10 meq via INTRAVENOUS
  Filled 2020-08-26: qty 100

## 2020-08-26 MED ORDER — POTASSIUM CHLORIDE CRYS ER 20 MEQ PO TBCR
40.0000 meq | EXTENDED_RELEASE_TABLET | Freq: Once | ORAL | Status: AC
  Administered 2020-08-26: 40 meq via ORAL
  Filled 2020-08-26: qty 2

## 2020-08-26 MED ORDER — MAGNESIUM SULFATE 2 GM/50ML IV SOLN: 2.0000 g | Freq: Once | INTRAVENOUS | Status: DC

## 2020-08-26 MED ORDER — MAGNESIUM SULFATE 2 GM/50ML IV SOLN
2.0000 g | Freq: Once | INTRAVENOUS | Status: AC
  Administered 2020-08-26: 2 g via INTRAVENOUS
  Filled 2020-08-26: qty 50

## 2020-08-26 NOTE — Evaluation (Addendum)
Clinical/Bedside Swallow Evaluation Patient Details  Name: [name] MRN: [id number] Date of Birth: 02/27/1950  Today's Date: 08/26/2020 Time:        Past Medical History:  Past Medical History:  Diagnosis Date   Cellulitis lower extremities   4 day hospitalization 12/25/14-12/29/14   Essential hypertension, benign    History of MRSA infection    History of panniculitis    Pulmonary emboli ([place]) 2009   Skin ulcer of thigh ([place])    Type 2 diabetes mellitus ([place])    Past Surgical History:  Past Surgical History:  Procedure Laterality Date   PARTIAL HYSTERECTOMY     Right thigh ulcer debridement  2008   Two occasions - wound VAC   HPI:  [name] is a 70 y.o. female with medical history significant of prior PE with current Xarelto, type 2 diabetes, hypertension, recurrent cellulitis, prior TIA on 03/2015, prior stroke (07/24/2018) and chronic ambulatory dysfunction-wheelchair-bound who presented to the ED from [facility] SNF due to altered mental status on 08/25/20.  She was just discharged to [facility] on 7/5 after significant improvement in her mentation with return to baseline after treatment of UTI with cefepime.  She apparently arrived to her SNF and was noted to be awake, but could not follow any commands or respond to external stimuli.  She was returned back to the ED for further evaluation.  CT head currently negative and patient appears to be more awake and alert this AM.  She is responding to questioning appropriately. CT head and CXR both negative on 08/24/20.  BSE generated d/t NPO status.    Assessment / Plan / Recommendation Clinical Impression  Pt with cognitive-based dysphagia potentially paired with presbyphagia (aging swallow) characterized by a delay in the initiation of the swallow and mild oral holding.  Slight impaired mastication noted with solids, but pt stated she just "got a new pair of teeth" which could certainly impact mastication.  Mildly delayed processing of  information with some anomia present intermittently within conversation, but pt was able to converse well despite this occurrence.  Various consistencies assessed including thin via cup/straw, ice chips, puree and solids.  No overt s/s of aspiration noted throughout evaluation.  OME revealed normal oral/motor skills.  Pt consuming medications without difficulty per nursing report.  Recommend initiate a Regular/thin liquid diet with ST f/u x1 during acute stay for diet tolerance/education re: swallowing safety.  Thank you for this consultation.   SLP Visit Diagnosis: Dysphagia, unspecified (R13.10)    Aspiration Risk  Mild aspiration risk    Diet Recommendation   Regular/thin liquids  Medication Administration: Whole meds with liquid    Other  Recommendations Oral Care Recommendations: Oral care BID   Follow up Recommendations Skilled Nursing facility      Frequency and Duration min 1 x/week  1 week       Prognosis Prognosis for Safe Diet Advancement: Good      Swallow Study   General HPI: [name] is a 70 y.o. female with medical history significant of prior PE with current Xarelto, type 2 diabetes, hypertension, recurrent cellulitis, prior TIA on 03/2015, prior stroke (07/24/2018) and chronic ambulatory dysfunction-wheelchair-bound who presented to the ED from [facility]F due to altered mental status.  She was just discharged to [facility] on 7/5 after significant improvement in her mentation with return to baseline after treatment of UTI with cefepime.  She apparently arrived to her SNF and was noted to be awake, but could not  follow any commands or respond to external stimuli.  She was returned back to the ED for further evaluation.  CT head currently negative and patient appears to be more awake and alert this AM.  She is responding to questioning appropriately. Previous Swallow Assessment: attempted 08/25/20, but unable d/t AMS Respiratory Status: Nasal cannula (2L) History of Recent  Intubation: No Behavior/Cognition: Alert;Cooperative;Requires cueing Oral Care Completed by SLP: Recent completion by staff Oral Cavity - Dentition: Dentures, top;Dentures, bottom Vision: Functional for self-feeding Baseline Vocal Quality: Low vocal intensity Volitional Cough: Strong Volitional Swallow: Able to elicit    Oral/Motor/Sensory Function Overall Oral Motor/Sensory Function: Generalized oral weakness   Ice Chips Ice chips: Within functional limits Presentation: Spoon   Thin Liquid Thin Liquid: Impaired Presentation: Cup;Spoon;Straw Oral Phase Functional Implications: Oral holding Pharyngeal  Phase Impairments: Suspected delayed Swallow    Nectar Thick Nectar Thick Liquid: Not tested   Honey Thick Honey Thick Liquid: Not tested   Puree Puree: Within functional limits Presentation: Self Fed   Solid     Solid: Impaired Presentation: Self Fed Oral Phase Functional Implications: Impaired mastication;Prolonged oral transit      Elvina Sidle, M.S., CCC-SLP 08/26/2020,1:13 PM

## 2020-08-26 NOTE — Progress Notes (Signed)
[facility] Progress Note  Patient: [name]    NTI:[id number]  DOA: 08/24/2020     Date of Service: the patient was seen and examined on 08/26/2020  Brief hospital course: Past medical history of PE on Xarelto, type II DM, HTN, TIA/CVA.  Presents with complaints of recurrent confusion and unresponsive episode. Neurology was consulted patient was transferred to [facility] from [facility]. EEG negative. Neurology signed off. Currently plan is cardiac work-up and discharged to SNF again.  Subjective: No nausea no vomiting.  No fever no chills.  No chest pain.  No acute complaint.  Assessment and Plan: 1.  Unresponsive episode. Acute encephalopathy likely metabolic. Syncope?  Vasovagal Presents with confusion. There was some concern for seizure-like event on admission. Neurology was consulted. Patient was brought to [facility] from [facility] for EEG. CT head unremarkable. Currently on my exam no focal deficit. Recently completed treatment for UTI. Metabolic work-up also unremarkable. Urological does not think that the patient actually has any neurological deficit that will cause recurrent unresponsive episode. Recommend psychiatric work-up Will get echocardiogram and 14-day ZIO monitor to complete syncopal work-up.  2.  Type II DM, uncontrolled with hypoglycemia without long-term insulin use Hemoglobin A1c 5.4. Suspect the patient is suffering from multiple hypoglycemic episode which might also be causing her encephalopathy. Holding home oral hypoglycemic agent. Recommended discontinue on discharge. Currently on sliding scale insulin.  3.  HTN Blood pressure elevated. Continuing home regimen.  4.  History of CVA On aspirin, Xarelto, statin. No further medication change  5.  PE Continue Xarelto currently no evidence of acute PE.  6.  Morbid obesity Placing the patient at high risk for poor outcome. Patient may also have sleep apnea  which might be causing cataplexy.  Benefit from outpatient sleep study.  7.  Mood disorder. Chronic neuropathy. Anxiety. Continue home regimen.  Scheduled Meds:  ammonium lactate   Topical BID   vitamin C  500 mg Oral Daily   aspirin  81 mg Oral Daily   atorvastatin  40 mg Oral Daily   cholecalciferol  1,000 Units Oral Daily   feeding supplement (GLUCERNA SHAKE)  237 mL Oral TID BM   ferrous sulfate  325 mg Oral Q breakfast   fluticasone  1 spray Each Nare Daily   gabapentin  100 mg Oral TID   insulin aspart  0-15 Units Subcutaneous Q4H   lisinopril  15 mg Oral Daily   mirtazapine  7.5 mg Oral QHS   rivaroxaban  20 mg Oral Q supper   verapamil  180 mg Oral Daily   Continuous Infusions: PRN Meds: senna  Body mass index is 44.72 kg/m.    Pressure Injury 08/25/20 Buttocks Left;Medial Stage 2 -  Partial thickness loss of dermis presenting as a shallow open injury with a red, pink wound bed without slough. (Active)  08/25/20 2100  Location: Buttocks  Location Orientation: Left;Medial  Staging: Stage 2 -  Partial thickness loss of dermis presenting as a shallow open injury with a red, pink wound bed without slough.  Wound Description (Comments):   Present on Admission:      Pressure Injury 08/25/20 Coccyx Mid Stage 2 -  Partial thickness loss of dermis presenting as a shallow open injury with a red, pink wound bed without slough. (Active)  08/25/20 2100  Location: Coccyx  Location Orientation: Mid  Staging: Stage 2 -  Partial thickness loss of dermis presenting as a shallow open injury with a  red, pink wound bed without slough.  Wound Description (Comments):   Present on Admission: Yes     DVT Prophylaxis:    rivaroxaban (XARELTO) tablet 20 mg    Advance goals of care discussion: Pt is DNR.  Family Communication: family was present at bedside, at the time of interview.  The pt provided permission to discuss medical plan with the family. Opportunity was given to ask  question and all questions were answered satisfactorily.   Data Reviewed: I have personally reviewed and interpreted daily labs, tele strips, imaging. Potassium 3.4.  Subsequent creatinine stable. Hemoglobin stable.  Echocardiogram shows preserved EF.  Physical Exam:  General: Appear in mild distress, no Rash; Oral Mucosa Clear, moist. no Abnormal Neck Mass Or lumps, Conjunctiva normal  Cardiovascular: S1 and S2 Present, no Murmur, Respiratory: good respiratory effort, Bilateral Air entry present and CTA, no Crackles, no wheezes Abdomen: Bowel Sound present, Soft and no tenderness Extremities: no Pedal edema Neurology: alert and oriented to time, place, and person affect appropriate. no new focal deficit Gait not checked due to patient safety concerns  Vitals:   08/26/20 0718 08/26/20 1208 08/26/20 1617 08/26/20 1941  BP: (!) 153/75 (!) 150/62 131/73 (!) 151/57  Pulse: 90 95 87 89  Resp: 18 17 17  (!) 23  Temp: 98 F (36.7 C) (!) 97.5 F (36.4 C) 98.5 F (36.9 C) 98.4 F (36.9 C)  TempSrc:  Oral Oral Oral  SpO2: 100% 98% 98% 100%  Weight:      Height:        Disposition:  Status is: Inpatient  Remains inpatient appropriate because:Ongoing diagnostic testing needed not appropriate for outpatient work up  Dispo: The patient is from: Home              Anticipated d/c is to: SNF              Patient currently is not medically stable to d/c.   Difficult to place patient No  Time spent: 35 minutes. I reviewed all nursing notes, pharmacy notes, vitals, pertinent old records. I have discussed plan of care as described above with RN.  Author: Berle Mull, MD [facility] 08/26/2020 7:53 PM  To reach On-call, see care teams to locate the attending and reach out via [email or web address]. Between 7PM-7AM, please contact night-coverage If you still have difficulty reaching the attending provider, please page the [facility] (Director on Call) for [facility] on amion for  assistance.

## 2020-08-26 NOTE — NC FL2 (Signed)
[place] MEDICAID FL2 LEVEL OF CARE SCREENING TOOL     IDENTIFICATION  Patient Name: [name] Birthdate: 02/01/1951 Sex: female Admission Date (Current Location): 08/24/2020  [facility] [place] Number:  [employer] and Address:  [facility] [place], [place] [address] [place], [place] [address]      Provider Number: [id number]  Attending Physician Name and Address:  Lavina Hamman, MD  Relative Name and Phone Number:  Jeneen Rinks, son, [phone]    Current Level of Care: Hospital Recommended Level of Care: [place] Prior Approval Number:    Date Approved/Denied:   PASRR Number: [id number]  Discharge Plan: SNF    Current Diagnoses: Patient Active Problem List   Diagnosis Date Noted   Pressure injury of skin 08/26/2020   Encephalopathy acute 08/24/2020   SIRS (systemic inflammatory response syndrome) ([place]) 08/21/2020   UTI (urinary tract infection) 08/21/2020   Hyperkalemia 08/21/2020   Hypoalbuminemia due to protein-calorie malnutrition ([place]) 76/19/5093   Acute metabolic encephalopathy 26/71/2458   CVA (cerebral vascular accident) ([place]) 07/24/2018   Depression 04/12/2015   TIA (transient ischemic attack) 04/09/2015   Aphasia 04/09/2015   Cellulitis and abscess of leg 04/09/2015   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   Fever, unspecified 12/25/2014   Sepsis ([place]) 12/25/2014   Cellulitis 12/25/2014   Essential hypertension, benign 12/20/2011   History of pulmonary embolus (PE) 12/20/2011   Morbid obesity ([place]) 12/20/2011   Preoperative cardiovascular examination 12/20/2011   Type 2 diabetes mellitus ([place]) 09/11/2006    Orientation RESPIRATION BLADDER Height & Weight     Self, Place, Time  O2 (Nasal cannula 2L) Incontinent, External catheter Weight: 244 lb 7.8 oz (110.9 kg) Height:  5\' 2"  (157.5 cm)  BEHAVIORAL SYMPTOMS/MOOD NEUROLOGICAL BOWEL NUTRITION STATUS      Incontinent Diet (Please see DC Summary)  AMBULATORY  STATUS COMMUNICATION OF NEEDS Skin   Extensive Assist Verbally PU Stage and Appropriate Care (Stage II on buttocks and coccyx with daily foam dressing)                       Personal Care Assistance Level of Assistance  Bathing, Feeding, Dressing Bathing Assistance: Maximum assistance Feeding assistance: Limited assistance Dressing Assistance: Limited assistance     Functional Limitations Info  Sight Sight Info: Impaired        SPECIAL CARE FACTORS FREQUENCY                       Contractures Contractures Info: Not present    Additional Factors Info  Code Status, Allergies, Insulin Sliding Scale Code Status Info: DNR Allergies Info: Vancomycin   Insulin Sliding Scale Info: See DC Summary       Current Medications (08/26/2020):  This is the current hospital active medication list Current Facility-Administered Medications  Medication Dose Route Frequency Provider Last Rate Last Admin   ammonium lactate (LAC-HYDRIN) 12 % lotion   Topical BID Norins, Heinz Knuckles, MD       ascorbic acid (VITAMIN C) tablet 500 mg  500 mg Oral Daily Norins, Heinz Knuckles, MD       aspirin chewable tablet 81 mg  81 mg Oral Daily Norins, Heinz Knuckles, MD       atorvastatin (LIPITOR) tablet 40 mg  40 mg Oral Daily Norins, Heinz Knuckles, MD       cholecalciferol (VITAMIN D3) tablet 1,000 Units  1,000 Units Oral Daily Norins, Heinz Knuckles, MD  dextrose 5 %-0.45 % sodium chloride infusion   Intravenous Continuous Heath Lark D, DO 50 mL/hr at 08/26/20 0600 New Bag at 08/26/20 0600   feeding supplement (GLUCERNA SHAKE) (GLUCERNA SHAKE) liquid 237 mL  237 mL Oral TID BM Norins, Heinz Knuckles, MD       ferrous sulfate tablet 325 mg  325 mg Oral Q breakfast Norins, Heinz Knuckles, MD       fluticasone (FLONASE) 50 MCG/ACT nasal spray 1 spray  1 spray Each Nare Daily Norins, Heinz Knuckles, MD   1 spray at 08/25/20 0920   gabapentin (NEURONTIN) capsule 100 mg  100 mg Oral TID Neena Rhymes, MD   100 mg at 08/26/20  0044   insulin aspart (novoLOG) injection 0-15 Units  0-15 Units Subcutaneous Q4H Shah, Pratik D, DO   2 Units at 08/26/20 0334   lisinopril (ZESTRIL) tablet 15 mg  15 mg Oral Daily Norins, Heinz Knuckles, MD       mirtazapine (REMERON) tablet 7.5 mg  7.5 mg Oral QHS Norins, Heinz Knuckles, MD   7.5 mg at 08/26/20 0044   rivaroxaban (XARELTO) tablet 20 mg  20 mg Oral Q supper Norins, Heinz Knuckles, MD       senna (SENOKOT) tablet 8.6 mg  1 tablet Oral Daily PRN Norins, Heinz Knuckles, MD       verapamil (CALAN-SR) CR tablet 180 mg  180 mg Oral Daily Norins, Heinz Knuckles, MD         Discharge Medications: Please see discharge summary for a list of discharge medications.  Relevant Imaging Results:  Relevant Lab Results:   Additional Information SSN: [id number]  Benard Halsted, LCSW

## 2020-08-27 DIAGNOSIS — I10 Essential (primary) hypertension: Secondary | ICD-10-CM | POA: Diagnosis not present

## 2020-08-27 LAB — GLUCOSE, CAPILLARY
Glucose-Capillary: 100 mg/dL — ABNORMAL HIGH (ref 70–99)
Glucose-Capillary: 106 mg/dL — ABNORMAL HIGH (ref 70–99)
Glucose-Capillary: 123 mg/dL — ABNORMAL HIGH (ref 70–99)
Glucose-Capillary: 158 mg/dL — ABNORMAL HIGH (ref 70–99)
Glucose-Capillary: 171 mg/dL — ABNORMAL HIGH (ref 70–99)
Glucose-Capillary: 177 mg/dL — ABNORMAL HIGH (ref 70–99)
Glucose-Capillary: 84 mg/dL (ref 70–99)

## 2020-08-27 NOTE — Progress Notes (Signed)
PROGRESS NOTE    Oneika Simonian Fix  SEG:[id number] DOB: [birth year] DOA: 08/24/2020 PCP: Caprice Renshaw, MD   Brief Narrative: Dreamer Carillo Charity is a 70 y.o. female with a history of PE, hypertension, CVA, diabetes mellitus type 2, hypertension. Patient presented secondary to an unresponsive episode with concern for metabolic encephalopathy. Seizure considered but EEG did not identify seizure activity at time of procedure.   Assessment & Plan:   Active Problems:   Type 2 diabetes mellitus (HCC)   Essential hypertension, benign   Morbid obesity (HCC)   Encephalopathy acute   Pressure injury of skin   Unresponsive episode Unsure of etiology. Differential includes syncopal episode, vasovagal episode, hypoglycemia. No recurrent episodes.  Acute metabolic encephalopathy In setting of unresponsive episode. Urinalysis not very remarkable for infection but patient started on Cefepime for empiric treatment of possible urinary infection on recent admission; at that time she had an associated Tmax of 102.6 F. Encephalopathy improved with treatment. EEG obtained on 7/6 and significant for moderate-severe diffuse encephalopathy but no evidence of seizure activity.  Diabetes mellitus, type 2 Hemoglobin A1C of 5.4%. Patient is on metformin as an outpatient.  Primary hypertension Patient is on lisinopril and Verapamil -Continue lisinopril and Verapamil  History of CVA Stable.  History of pulmonary embolism Patient is on Xarelto -Continue Xarelto  Morbid obesity Body mass index is 44.72 kg/m.  Mood disorder Anxiety Patient is on Remeron as an outpatient -Continue Remeron  Chronic neuropathy -Continue gabapentin  Pressure injury Mid coccyx, left/medial buttocks POA   DVT prophylaxis:  rivaroxaban (XARELTO) tablet 20 mg  Code Status:   Code Status: DNR Family Communication: Son and sister on telephone Disposition Plan: Discharge home likely in 24 hours pending PT/OT  recommendations   Consultants:  None  Procedures:  EEG (08/25/2020) IMPRESSION: This study is suggestive of moderate to severe diffuse encephalopathy, nonspecific etiology but could be secondary to toxic, metabolic etiology. No seizures or definite epileptiform discharges were seen throughout the recording.  Antimicrobials: Cefepime IV    Subjective: No concerns.  Objective: Vitals:   08/27/20 0000 08/27/20 0400 08/27/20 0800 08/27/20 1200  BP: (!) 141/48 (!) 136/44 (!) 155/55   Pulse: 74 65 80   Resp: 13 15 14    Temp: 98.7 F (37.1 C) 97.9 F (36.6 C) 98.4 F (36.9 C) 98.3 F (36.8 C)  TempSrc: Oral Axillary Oral Oral  SpO2:   100%   Weight:      Height:        Intake/Output Summary (Last 24 hours) at 08/27/2020 1517 Last data filed at 08/27/2020 0500 Gross per 24 hour  Intake 388.59 ml  Output 700 ml  Net -311.41 ml   Filed Weights   08/24/20 1838  Weight: 110.9 kg    Examination:  General exam: Appears calm and comfortable Respiratory system: Clear to auscultation. Respiratory effort normal. Cardiovascular system: S1 & S2 heard, RRR. No murmurs, rubs, gallops or clicks. Gastrointestinal system: Abdomen is nondistended, soft and nontender. No organomegaly or masses felt. Normal bowel sounds heard. Central nervous system: Alert. No focal neurological deficits. Musculoskeletal:  No calf tenderness Skin: No cyanosis. No rashes Psychiatry: Judgement and insight appear normal. Mood & affect appropriate.     Data Reviewed: I have personally reviewed following labs and imaging studies  CBC Lab Results  Component Value Date   WBC 8.7 08/26/2020   RBC 4.22 08/26/2020   HGB 11.0 (L) 08/26/2020   HCT 36.7 08/26/2020   MCV 87.0 08/26/2020  MCH 26.1 08/26/2020   PLT 275 08/26/2020   MCHC 30.0 08/26/2020   RDW 16.3 (H) 08/26/2020   LYMPHSABS 2.2 08/24/2020   MONOABS 0.6 08/24/2020   EOSABS 0.1 08/24/2020   BASOSABS 0.0 32/99/2426     Last metabolic  panel Lab Results  Component Value Date   NA 142 08/26/2020   K 3.4 (L) 08/26/2020   CL 113 (H) 08/26/2020   CO2 20 (L) 08/26/2020   BUN 12 08/26/2020   CREATININE 1.05 (H) 08/26/2020   GLUCOSE 127 (H) 08/26/2020   GFRNONAA 57 (L) 08/26/2020   GFRAA 50 (L) 07/25/2018   CALCIUM 8.6 (L) 08/26/2020   PHOS 3.2 08/21/2020   PROT 6.3 (L) 08/24/2020   ALBUMIN 2.6 (L) 08/24/2020   BILITOT 0.4 08/24/2020   ALKPHOS 54 08/24/2020   AST 22 08/24/2020   ALT 23 08/24/2020   ANIONGAP 9 08/26/2020    CBG (last 3)  Recent Labs    08/27/20 0545 08/27/20 0812 08/27/20 1222  GLUCAP 100* 106* 84     GFR: Estimated Creatinine Clearance: 58.6 mL/min (A) (by C-G formula based on SCr of 1.05 mg/dL (H)).  Coagulation Profile: Recent Labs  Lab 08/21/20 0643  INR 1.1    Recent Results (from the past 240 hour(s))  Blood Culture (routine x 2)     Status: None   Collection Time: 08/20/20  8:30 PM   Specimen: BLOOD LEFT ARM  Result Value Ref Range Status   Specimen Description BLOOD LEFT ARM  Final   Special Requests   Final    BOTTLES DRAWN AEROBIC AND ANAEROBIC Blood Culture adequate volume   Culture   Final    NO GROWTH 5 DAYS Performed at [facility], [address], [place], [place] [address]    Report Status 08/25/2020 FINAL  Final  Resp Panel by RT-PCR (Flu A&B, Covid) Nasopharyngeal Swab     Status: None   Collection Time: 08/20/20  8:42 PM   Specimen: Nasopharyngeal Swab; Nasopharyngeal(NP) swabs in vial transport medium  Result Value Ref Range Status   SARS Coronavirus 2 by RT PCR NEGATIVE NEGATIVE Final    Comment: (NOTE) SARS-CoV-2 target nucleic acids are NOT DETECTED.  The SARS-CoV-2 RNA is generally detectable in upper respiratory specimens during the acute phase of infection. The lowest concentration of SARS-CoV-2 viral copies this assay can detect is 138 copies/mL. A negative result does not preclude SARS-Cov-2 infection and should not be used as the sole basis for  treatment or other patient management decisions. A negative result may occur with  improper specimen collection/handling, submission of specimen other than nasopharyngeal swab, presence of viral mutation(s) within the areas targeted by this assay, and inadequate number of viral copies(<138 copies/mL). A negative result must be combined with clinical observations, patient history, and epidemiological information. The expected result is Negative.  Fact Sheet for Patients:  EntrepreneurPulse.com.au  Fact Sheet for Healthcare Providers:  IncredibleEmployment.be  This test is no t yet approved or cleared by the [place] FDA and  has been authorized for detection and/or diagnosis of SARS-CoV-2 by FDA under an Emergency Use Authorization (EUA). This EUA will remain  in effect (meaning this test can be used) for the duration of the COVID-19 declaration under Section 564(b)(1) of the Act, 21 U.S.C.section 360bbb-3(b)(1), unless the authorization is terminated  or revoked sooner.       Influenza A by PCR NEGATIVE NEGATIVE Final   Influenza B by PCR NEGATIVE NEGATIVE Final    Comment: (NOTE)  The Xpert Xpress SARS-CoV-2/FLU/RSV plus assay is intended as an aid in the diagnosis of influenza from Nasopharyngeal swab specimens and should not be used as a sole basis for treatment. Nasal washings and aspirates are unacceptable for Xpert Xpress SARS-CoV-2/FLU/RSV testing.  Fact Sheet for Patients: EntrepreneurPulse.com.au  Fact Sheet for Healthcare Providers: IncredibleEmployment.be  This test is not yet approved or cleared by the [place] FDA and has been authorized for detection and/or diagnosis of SARS-CoV-2 by FDA under an Emergency Use Authorization (EUA). This EUA will remain in effect (meaning this test can be used) for the duration of the COVID-19 declaration under Section 564(b)(1) of the Act, 21  U.S.C. section 360bbb-3(b)(1), unless the authorization is terminated or revoked.  Performed at [facility], [address], [place], [place] [address]   Blood Culture (routine x 2)     Status: None   Collection Time: 08/20/20  8:42 PM   Specimen: Right Antecubital; Blood  Result Value Ref Range Status   Specimen Description RIGHT ANTECUBITAL  Final   Special Requests   Final    BOTTLES DRAWN AEROBIC AND ANAEROBIC Blood Culture adequate volume   Culture   Final    NO GROWTH 5 DAYS Performed at [facility], [address] [place], [place] [address]    Report Status 08/25/2020 FINAL  Final  Urine culture     Status: Abnormal   Collection Time: 08/20/20 10:31 PM   Specimen: Urine, Clean Catch  Result Value Ref Range Status   Specimen Description   Final    URINE, CLEAN CATCH Performed at [facility], [address] [place], [place] [address]    Special Requests   Final    NONE Performed at [facility], [address], [place], [place] [address]    Culture MULTIPLE SPECIES PRESENT, SUGGEST RECOLLECTION (A)  Final   Report Status 08/22/2020 FINAL  Final  MRSA Next Gen by PCR, Nasal     Status: None   Collection Time: 08/21/20  5:18 AM   Specimen: Nasal Mucosa; Nasal Swab  Result Value Ref Range Status   MRSA by PCR Next Gen NOT DETECTED NOT DETECTED Final    Comment: (NOTE) The GeneXpert MRSA Assay (FDA approved for NASAL specimens only), is one component of a comprehensive MRSA colonization surveillance program. It is not intended to diagnose MRSA infection nor to guide or monitor treatment for MRSA infections. Test performance is not FDA approved in patients less than 14 years old. Performed at [facility], [address] [place], [place] [address]   Urine culture     Status: None   Collection Time: 08/24/20  7:42 PM   Specimen: Urine, Catheterized  Result Value Ref Range Status   Specimen Description   Final    URINE, CATHETERIZED Performed at [facility],  [address], [place], [place] [address]    Special Requests   Final    NONE Performed at [facility], [address] [place], [place] [address]    Culture   Final    NO GROWTH Performed at [facility], [place] [address] [place],  [address]    Report Status 08/26/2020 FINAL  Final  SARS CORONAVIRUS 2 (TAT 6-24 HRS) Nasopharyngeal Nasopharyngeal Swab     Status: None   Collection Time: 08/25/20  6:25 PM   Specimen: Nasopharyngeal Swab  Result Value Ref Range Status   SARS Coronavirus 2 NEGATIVE NEGATIVE Final    Comment: (NOTE) SARS-CoV-2 target nucleic acids are NOT  DETECTED.  The SARS-CoV-2 RNA is generally detectable in upper and lower respiratory specimens during the acute phase of infection. Negative results do not preclude SARS-CoV-2 infection, do not rule out co-infections with other pathogens, and should not be used as the sole basis for treatment or other patient management decisions. Negative results must be combined with clinical observations, patient history, and epidemiological information. The expected result is Negative.  Fact Sheet for Patients: SugarRoll.be  Fact Sheet for Healthcare Providers: https://www.woods-mathews.com/  This test is not yet approved or cleared by the [place] FDA and  has been authorized for detection and/or diagnosis of SARS-CoV-2 by FDA under an Emergency Use Authorization (EUA). This EUA will remain  in effect (meaning this test can be used) for the duration of the COVID-19 declaration under Se ction 564(b)(1) of the Act, 21 U.S.C. section 360bbb-3(b)(1), unless the authorization is terminated or revoked sooner.  Performed at [facility], [place] [address] [place], [place] [address]         Radiology Studies: ECHOCARDIOGRAM COMPLETE  Result Date: 08/26/2020    ECHOCARDIOGRAM REPORT   Patient Name:   VERNE COVE Lira Date of Exam: 08/26/2020 Medical Rec #:  [id number]      Height:       62.0 in Accession #:    [id number]    Weight:       244.5 lb Date of Birth:  1950-03-22      BSA:          2.082 m Patient Age:    16 years      BP:           150/62 mmHg Patient Gender: F             HR:           94 bpm. Exam Location:  Inpatient Procedure: 2D Echo, Cardiac Doppler and Color Doppler Indications:    R55 Syncope  History:        Patient has prior history of Echocardiogram examinations, most                 recent 07/25/2018. Risk Factors:Morbid obesity.  Sonographer:    Merrie Roof RDCS Referring Phys: [id number] [place]  1. Left ventricular ejection fraction, by estimation, is 65 to 70%. The left ventricle has hyperdynamic function. The left ventricle has no regional wall motion abnormalities. There is moderate left ventricular hypertrophy. Left ventricular diastolic parameters are consistent with Grade I diastolic dysfunction (impaired relaxation). Mid-cavity LV gradient, peak 41 mmHg with Valsalva.  2. Right ventricular systolic function is normal. The right ventricular size is normal. Tricuspid regurgitation signal is inadequate for assessing PA pressure.  3. Left atrial size was mildly dilated.  4. The mitral valve is normal in structure. No evidence of mitral valve regurgitation. No evidence of mitral stenosis. Moderate mitral annular calcification.  5. The aortic valve is tricuspid. Aortic valve regurgitation is not visualized. Mild aortic valve stenosis. Aortic valve mean gradient measures 10.0 mmHg.  6. The inferior vena cava is normal in size with greater than 50% respiratory variability, suggesting right atrial pressure of 3 mmHg. FINDINGS  Left Ventricle: Left ventricular ejection fraction, by estimation, is 65 to 70%. The left ventricle has hyperdynamic function. The left ventricle has no regional wall motion abnormalities. The left ventricular internal cavity size was normal in size. There is moderate left ventricular hypertrophy. Left ventricular diastolic  parameters are consistent with Grade I diastolic dysfunction (impaired relaxation).  Right Ventricle: The right ventricular size is normal. No increase in right ventricular wall thickness. Right ventricular systolic function is normal. Tricuspid regurgitation signal is inadequate for assessing PA pressure. Left Atrium: Left atrial size was mildly dilated. Right Atrium: Right atrial size was normal in size. Pericardium: There is no evidence of pericardial effusion. Mitral Valve: The mitral valve is normal in structure. There is mild calcification of the mitral valve leaflet(s). Moderate mitral annular calcification. No evidence of mitral valve regurgitation. No evidence of mitral valve stenosis. Tricuspid Valve: The tricuspid valve is normal in structure. Tricuspid valve regurgitation is not demonstrated. Aortic Valve: The aortic valve is tricuspid. Aortic valve regurgitation is not visualized. Mild aortic stenosis is present. Aortic valve mean gradient measures 10.0 mmHg. Aortic valve peak gradient measures 18.3 mmHg. Aortic valve area, by VTI measures 1.41 cm. Pulmonic Valve: The pulmonic valve was normal in structure. Pulmonic valve regurgitation is not visualized. Aorta: The aortic root is normal in size and structure. Venous: The inferior vena cava is normal in size with greater than 50% respiratory variability, suggesting right atrial pressure of 3 mmHg. IAS/Shunts: No atrial level shunt detected by color flow Doppler.  LEFT VENTRICLE PLAX 2D LVIDd:         4.20 cm  Diastology LVIDs:         3.10 cm  LV e' medial:    8.49 cm/s LV PW:         1.20 cm  LV E/e' medial:  6.8 LV IVS:        1.30 cm  LV e' lateral:   9.68 cm/s LVOT diam:     1.90 cm  LV E/e' lateral: 6.0 LV SV:         44 LV SV Index:   21 LVOT Area:     2.84 cm  RIGHT VENTRICLE RV Basal diam:  2.80 cm LEFT ATRIUM             Index       RIGHT ATRIUM           Index LA diam:        4.70 cm 2.26 cm/m  RA Area:     14.30 cm LA Vol (A2C):   72.2 ml  34.68 ml/m RA Volume:   29.20 ml  14.02 ml/m LA Vol (A4C):   67.9 ml 32.61 ml/m LA Biplane Vol: 72.4 ml 34.77 ml/m  AORTIC VALVE AV Area (Vmax):    1.59 cm AV Area (Vmean):   1.46 cm AV Area (VTI):     1.41 cm AV Vmax:           214.00 cm/s AV Vmean:          147.500 cm/s AV VTI:            0.314 m AV Peak Grad:      18.3 mmHg AV Mean Grad:      10.0 mmHg LVOT Vmax:         120.00 cm/s LVOT Vmean:        76.000 cm/s LVOT VTI:          0.156 m LVOT/AV VTI ratio: 0.50  AORTA Ao Root diam: 3.10 cm MITRAL VALVE MV Area (PHT): 4.39 cm     SHUNTS MV Decel Time: 173 msec     Systemic VTI:  0.16 m MV E velocity: 57.90 cm/s   Systemic Diam: 1.90 cm MV A velocity: 125.00 cm/s MV E/A ratio:  0.46 Loralie Champagne MD  Electronically signed by Loralie Champagne MD Signature Date/Time: 08/26/2020/5:35:32 PM    Final         Scheduled Meds:  ammonium lactate   Topical BID   vitamin C  500 mg Oral Daily   aspirin  81 mg Oral Daily   atorvastatin  40 mg Oral Daily   cholecalciferol  1,000 Units Oral Daily   feeding supplement (GLUCERNA SHAKE)  237 mL Oral TID BM   ferrous sulfate  325 mg Oral Q breakfast   fluticasone  1 spray Each Nare Daily   gabapentin  100 mg Oral TID   insulin aspart  0-15 Units Subcutaneous Q4H   lisinopril  15 mg Oral Daily   mirtazapine  7.5 mg Oral QHS   rivaroxaban  20 mg Oral Q supper   verapamil  180 mg Oral Daily   Continuous Infusions:   LOS: 3 days     Cordelia Poche, MD [facility] 08/27/2020, 3:17 PM  If 7PM-7AM, please contact night-coverage [email or web address]

## 2020-08-27 NOTE — Plan of Care (Signed)
  Problem: Education: Goal: Knowledge of General Education information will improve Description Including pain rating scale, medication(s)/side effects and non-pharmacologic comfort measures Outcome: Progressing   Problem: Clinical Measurements: Goal: Ability to maintain clinical measurements within normal limits will improve Outcome: Progressing   Problem: Clinical Measurements: Goal: Will remain free from infection Outcome: Progressing   

## 2020-08-28 DIAGNOSIS — I10 Essential (primary) hypertension: Secondary | ICD-10-CM | POA: Diagnosis not present

## 2020-08-28 LAB — GLUCOSE, CAPILLARY
Glucose-Capillary: 133 mg/dL — ABNORMAL HIGH (ref 70–99)
Glucose-Capillary: 109 mg/dL — ABNORMAL HIGH (ref 70–99)
Glucose-Capillary: 100 mg/dL — ABNORMAL HIGH (ref 70–99)
Glucose-Capillary: 92 mg/dL (ref 70–99)

## 2020-08-28 NOTE — Plan of Care (Signed)

## 2020-08-28 NOTE — Progress Notes (Signed)
OT Cancellation Note  Patient Details Name: [name] MRN: [id number] DOB: [birth year]   Cancelled Treatment:    Reason Eval/Treat Not Completed: OT screened, no needs identified, will sign off. Pt was screened by OT at [facility] on 7/4 due to pt is a resident at [facility], bed bound/wheelchair bound, hoyer OOB, and A for all ADLs pta. All this still holds true, eval not completed due to no skilled needs.  [place], OTR/L Acute Rehab Services Pager [phone] Office [phone]    Almon Register 08/28/2020, 9:22 AM

## 2020-08-28 NOTE — TOC Transition Note (Signed)
Transition of Care [facility]) - CM/SW Discharge Note   Patient Details  Name: [name] MRN: [id number] Date of Birth: Sep 27, 1950  Transition of Care [facility]) CM/SW Contact:  Geralynn Ochs, LCSW Phone Number: 08/28/2020, 12:58 PM   Clinical Narrative:   CSW confirmed with patient's son Jeneen Rinks that plan for patient to return to LTC at [place], and confirmed bed availability at [facility] for patient to return.  Nurse to call report to [phone], Room A30, Bed 2.  Nurse to call patient's son, Jeneen Rinks [phone], when Corey Harold arrives to pick her up.     Final next level of care: Long Term Acute Care (LTAC) Barriers to Discharge: Barriers Resolved   Patient Goals and CMS Choice Patient states their goals for this hospitalization and ongoing recovery are:: to get back to [email or web address] Compare Post Acute Care list provided to:: Patient Choice offered to / list presented to : Patient  Discharge Placement              Patient chooses bed at:  [facility]) Patient to be transferred to facility by: [place] Name of family member notified: Jeneen Rinks Patient and family notified of of transfer: 08/28/20  Discharge Plan and Services                                     Social Determinants of Health (SDOH) Interventions     Readmission Risk Interventions No flowsheet data found.

## 2020-08-28 NOTE — Progress Notes (Signed)
PT Cancellation/Discharge Note  Patient Details Name: [name] MRN: [id number] DOB: 1950/05/25   Cancelled Treatment:    Reason Eval/Treat Not Completed: PT screened, no needs identified, will sign off.  See PT note from [facility] from 08/23/20.  Pt is beadbound, hoyer to WC at baseline.  No acute PT needs at this time.  From SNF and appropriate to return to SNF at discharge.  PT to sign off.   Thanks,  Verdene Lennert, PT, DPT  Acute Rehabilitation Ortho Tech Supervisor [phone] pager #[phone] office      Wells Guiles B Robben Jagiello 08/28/2020, 9:32 AM

## 2020-08-28 NOTE — Discharge Summary (Signed)
Physician Discharge Summary  [name] ALP:[id number] DOB: 07-23-1950 DOA: 08/24/2020  PCP: Caprice Renshaw, MD  Admit date: 08/24/2020 Discharge date: 08/28/2020  Admitted From: SNF Disposition: SNF  Recommendations for Outpatient Follow-up:  Follow up with PCP in 1 week Consider working with PT intermittently to help with improving aspects of patient independence/mobility Please follow up on the following pending results: None   Discharge Condition: Stable CODE STATUS: DNR Diet recommendation: Carb modified   Brief/Interim Summary:  Admission HPI written by Neena Rhymes, MD    HPI: [name] is a 70 y.o. female with medical history significant of prior PE with current Xarelto, type 2 diabetes, hypertension, recurrent cellulitis, prior TIA on 03/2015, prior stroke (07/24/2018) and chronic ambulatory dysfunction-wheelchair-bound who presented to the ED from [facility] [place] due to altered mental status.  Patient was recently admitted with acute metabolic encephalopathy likely secondary to failure of outpatient treatment of UTI with IM Rocephin.  Patient was empirically treated for UTI with cefepime and had profound improvement in her mentation over the following 1-2 days. She was d/c'd back to [facility] 04/25/30 mid-day. On arrival to [facility] SNF she was awake but would not/could not follow commands, could not talk or respond to external stimuli. She was transported to [facility] for further evaluation   Hospital course:   Unresponsive episode Unsure of etiology. Differential includes syncopal episode, vasovagal episode, hypoglycemia. No recurrent episodes.   Acute metabolic encephalopathy In setting of unresponsive episode. On previous admission, urinalysis not very remarkable for infection but patient started on Cefepime for empiric treatment of possible urinary infection. At that time she had an associated Tmax of 102.6 F. This admission, encephalopathy improved with time. EEG  obtained on 7/6 and significant for moderate-severe diffuse encephalopathy but no evidence of seizure activity.   Diabetes mellitus, type 2 Hemoglobin A1C of 5.4%. Patient is on metformin as an outpatient. Resume metformin on discharge but this could possibly be discontinued with current hemoglobin A1C.   Primary hypertension Patient is on lisinopril and Verapamil. Continue lisinopril and Verapamil   History of CVA Stable.   History of pulmonary embolism Patient is on Xarelto. Continue Xarelto   Morbid obesity Body mass index is 44.72 kg/m.   Mood disorder Anxiety Patient is on Remeron as an outpatient. Continue Remeron   Chronic neuropathy Continue gabapentin   Pressure injury Mid coccyx, left/medial buttocks POA  Discharge Diagnoses:  Active Problems:   Type 2 diabetes mellitus (HCC)   Essential hypertension, benign   Morbid obesity (HCC)   Encephalopathy acute   Pressure injury of skin    Discharge Instructions   Allergies as of 08/28/2020       Reactions   Vancomycin Hives, Rash   "big red bumps, then they turned white, then I shedded like a snake."         Medication List     STOP taking these medications    oxyCODONE-acetaminophen 5-325 MG tablet Commonly known as: PERCOCET/ROXICET       TAKE these medications    ammonium lactate 12 % lotion Commonly known as: LAC-HYDRIN Apply topically 2 (two) times daily.   aspirin 81 MG chewable tablet Chew 1 tablet (81 mg total) by mouth daily.   atorvastatin 40 MG tablet Commonly known as: LIPITOR Take 40 mg by mouth daily.   cholecalciferol 25 MCG (1000 UNIT) tablet Commonly known as: VITAMIN D3 Take 1,000 Units by mouth daily.   feeding supplement (GLUCERNA SHAKE) Liqd Take 237  mLs by mouth 3 (three) times daily between meals.   ferrous sulfate 325 (65 FE) MG tablet Take 325 mg by mouth daily with breakfast.   fluticasone 50 MCG/ACT nasal spray Commonly known as: FLONASE Place 1 spray  into both nostrils daily.   gabapentin 100 MG capsule Commonly known as: NEURONTIN Take 100 mg by mouth 3 (three) times daily.   lisinopril 30 MG tablet Commonly known as: ZESTRIL Take 15 mg by mouth daily.   metFORMIN 1000 MG tablet Commonly known as: GLUCOPHAGE Take 1 tablet by mouth 2 (two) times a day.   mirtazapine 7.5 MG tablet Commonly known as: REMERON Take 1 tablet (7.5 mg total) by mouth at bedtime.   senna 8.6 MG Tabs tablet Commonly known as: SENOKOT Take 1 tablet (8.6 mg total) by mouth daily as needed for mild constipation.   verapamil 180 MG CR tablet Commonly known as: CALAN-SR Take 180 mg by mouth daily.   vitamin C 500 MG tablet Commonly known as: ASCORBIC ACID Take 500 mg by mouth daily.   Xarelto 20 MG Tabs tablet Generic drug: rivaroxaban Take 20 mg by mouth daily with supper.        Allergies  Allergen Reactions   Vancomycin Hives and Rash    "big red bumps, then they turned white, then I shedded like a snake."     Consultations: None   Procedures/Studies: CT Head Wo Contrast  Result Date: 08/24/2020 CLINICAL DATA:  Altered mental status EXAM: CT HEAD WITHOUT CONTRAST TECHNIQUE: Contiguous axial images were obtained from the base of the skull through the vertex without intravenous contrast. COMPARISON:  MR brain dated 07/20/2020 FINDINGS: Brain: No evidence of acute infarction, hemorrhage, hydrocephalus, extra-axial collection or mass lesion/mass effect. Old left basal ganglia lacunar infarct. Subcortical white matter and periventricular small vessel ischemic changes. Vascular: Intracranial atherosclerosis. Skull: Normal. Negative for fracture or focal lesion. Sinuses/Orbits: The visualized paranasal sinuses are essentially clear. The mastoid air cells are unopacified. Other: None. IMPRESSION: No evidence of acute intracranial abnormality. Old left basal ganglia lacunar infarct. Small vessel ischemic changes. Electronically Signed   By: Julian Hy M.D.   On: 08/24/2020 19:32   DG Chest Port 1 View  Result Date: 08/20/2020 CLINICAL DATA:  Increased confusion. EXAM: PORTABLE CHEST 1 VIEW COMPARISON:  April 09, 2015 FINDINGS: Low lung volumes are seen with mild, chronic appearing diffusely increased lung markings. There is no evidence of acute infiltrate, pleural effusion or pneumothorax. The heart size and mediastinal contours are within normal limits. There is marked severity calcification of the aortic arch. The visualized skeletal structures are unremarkable. IMPRESSION: No active cardiopulmonary disease. Electronically Signed   By: Virgina Norfolk M.D.   On: 08/20/2020 21:07   EEG adult  Result Date: 08/25/2020 Lora Havens, MD     08/25/2020  1:13 PM Patient Name: [name] MRN: [id number] Epilepsy Attending: Lora Havens Referring Physician/Provider: Dr Heath Lark Date: 08/25/2020 Duration: 22.33 mins Patient history: 70 year old female with acute encephalopathy.  EEG evaluate for seizures. Level of alertness: lethargic AEDs during EEG study: Gabapentin Technical aspects: This EEG study was done with scalp electrodes positioned according to the 10-20 International system of electrode placement. Electrical activity was acquired at a sampling rate of 500Hz  and reviewed with a high frequency filter of 70Hz  and a low frequency filter of 1Hz . EEG data were recorded continuously and digitally stored. Description: No clear posterior dominant rhythm was seen. EEG showed continuous generalized polymorphic sharply contoured 3  to 5 Hz theta-delta slowing, at times with triphasic morphology. Hyperventilation and photic stimulation were not performed.   ABNORMALITY - Continuous slow, generalized IMPRESSION: This study is suggestive of moderate to severe diffuse encephalopathy, nonspecific etiology but could be secondary to toxic, metabolic etiology. No seizures or definite epileptiform discharges were seen throughout the recording.  Lora Havens   ECHOCARDIOGRAM COMPLETE  Result Date: 08/26/2020    ECHOCARDIOGRAM REPORT   Patient Name:   [name] Doutt Date of Exam: 08/26/2020 Medical Rec #:  [id number]     Height:       62.0 in Accession #:    [id number]    Weight:       244.5 lb Date of Birth:  [birth year]      BSA:          2.082 m Patient Age:    22 years      BP:           150/62 mmHg Patient Gender: F             HR:           94 bpm. Exam Location:  Inpatient Procedure: 2D Echo, Cardiac Doppler and Color Doppler Indications:    R55 Syncope  History:        Patient has prior history of Echocardiogram examinations, most                 recent 07/25/2018. Risk Factors:Morbid obesity.  Sonographer:    Merrie Roof RDCS Referring Phys: [id number] [place]  1. Left ventricular ejection fraction, by estimation, is 65 to 70%. The left ventricle has hyperdynamic function. The left ventricle has no regional wall motion abnormalities. There is moderate left ventricular hypertrophy. Left ventricular diastolic parameters are consistent with Grade I diastolic dysfunction (impaired relaxation). Mid-cavity LV gradient, peak 41 mmHg with Valsalva.  2. Right ventricular systolic function is normal. The right ventricular size is normal. Tricuspid regurgitation signal is inadequate for assessing PA pressure.  3. Left atrial size was mildly dilated.  4. The mitral valve is normal in structure. No evidence of mitral valve regurgitation. No evidence of mitral stenosis. Moderate mitral annular calcification.  5. The aortic valve is tricuspid. Aortic valve regurgitation is not visualized. Mild aortic valve stenosis. Aortic valve mean gradient measures 10.0 mmHg.  6. The inferior vena cava is normal in size with greater than 50% respiratory variability, suggesting right atrial pressure of 3 mmHg. FINDINGS  Left Ventricle: Left ventricular ejection fraction, by estimation, is 65 to 70%. The left ventricle has hyperdynamic function. The left ventricle  has no regional wall motion abnormalities. The left ventricular internal cavity size was normal in size. There is moderate left ventricular hypertrophy. Left ventricular diastolic parameters are consistent with Grade I diastolic dysfunction (impaired relaxation). Right Ventricle: The right ventricular size is normal. No increase in right ventricular wall thickness. Right ventricular systolic function is normal. Tricuspid regurgitation signal is inadequate for assessing PA pressure. Left Atrium: Left atrial size was mildly dilated. Right Atrium: Right atrial size was normal in size. Pericardium: There is no evidence of pericardial effusion. Mitral Valve: The mitral valve is normal in structure. There is mild calcification of the mitral valve leaflet(s). Moderate mitral annular calcification. No evidence of mitral valve regurgitation. No evidence of mitral valve stenosis. Tricuspid Valve: The tricuspid valve is normal in structure. Tricuspid valve regurgitation is not demonstrated. Aortic Valve: The aortic valve is tricuspid. Aortic valve regurgitation is not visualized.  Mild aortic stenosis is present. Aortic valve mean gradient measures 10.0 mmHg. Aortic valve peak gradient measures 18.3 mmHg. Aortic valve area, by VTI measures 1.41 cm. Pulmonic Valve: The pulmonic valve was normal in structure. Pulmonic valve regurgitation is not visualized. Aorta: The aortic root is normal in size and structure. Venous: The inferior vena cava is normal in size with greater than 50% respiratory variability, suggesting right atrial pressure of 3 mmHg. IAS/Shunts: No atrial level shunt detected by color flow Doppler.  LEFT VENTRICLE PLAX 2D LVIDd:         4.20 cm  Diastology LVIDs:         3.10 cm  LV e' medial:    8.49 cm/s LV PW:         1.20 cm  LV E/e' medial:  6.8 LV IVS:        1.30 cm  LV e' lateral:   9.68 cm/s LVOT diam:     1.90 cm  LV E/e' lateral: 6.0 LV SV:         44 LV SV Index:   21 LVOT Area:     2.84 cm  RIGHT  VENTRICLE RV Basal diam:  2.80 cm LEFT ATRIUM             Index       RIGHT ATRIUM           Index LA diam:        4.70 cm 2.26 cm/m  RA Area:     14.30 cm LA Vol (A2C):   72.2 ml 34.68 ml/m RA Volume:   29.20 ml  14.02 ml/m LA Vol (A4C):   67.9 ml 32.61 ml/m LA Biplane Vol: 72.4 ml 34.77 ml/m  AORTIC VALVE AV Area (Vmax):    1.59 cm AV Area (Vmean):   1.46 cm AV Area (VTI):     1.41 cm AV Vmax:           214.00 cm/s AV Vmean:          147.500 cm/s AV VTI:            0.314 m AV Peak Grad:      18.3 mmHg AV Mean Grad:      10.0 mmHg LVOT Vmax:         120.00 cm/s LVOT Vmean:        76.000 cm/s LVOT VTI:          0.156 m LVOT/AV VTI ratio: 0.50  AORTA Ao Root diam: 3.10 cm MITRAL VALVE MV Area (PHT): 4.39 cm     SHUNTS MV Decel Time: 173 msec     Systemic VTI:  0.16 m MV E velocity: 57.90 cm/s   Systemic Diam: 1.90 cm MV A velocity: 125.00 cm/s MV E/A ratio:  0.46 Loralie Champagne MD Electronically signed by Loralie Champagne MD Signature Date/Time: 08/26/2020/5:35:32 PM    Final      Subjective: No concerns this morning. Was hoping to start work on improving ability to ambulate.  Discharge Exam: Vitals:   08/28/20 0922 08/28/20 1205  BP: (!) 143/52 (!) 103/50  Pulse:    Resp:    Temp:  98.5 F (36.9 C)  SpO2:     Vitals:   08/28/20 0408 08/28/20 0756 08/28/20 0922 08/28/20 1205  BP: 90/70 (!) 97/58 (!) 143/52 (!) 103/50  Pulse: 78     Resp: 16     Temp: 98.9 F (37.2 C) 98.2 F (36.8 C)  98.5 F (36.9  C)  TempSrc: Axillary Oral  Oral  SpO2: 95%     Weight:      Height:        General: Pt is alert, awake, not in acute distress Cardiovascular: RRR, S1/S2 +, no rubs, no gallops Respiratory: CTA bilaterally, no wheezing, no rhonchi Abdominal: Soft, NT, ND, bowel sounds + Extremities: no edema, no cyanosis    The results of significant diagnostics from this hospitalization (including imaging, microbiology, ancillary and laboratory) are listed below for reference.      Microbiology: Recent Results (from the past 240 hour(s))  Blood Culture (routine x 2)     Status: None   Collection Time: 08/20/20  8:30 PM   Specimen: BLOOD LEFT ARM  Result Value Ref Range Status   Specimen Description BLOOD LEFT ARM  Final   Special Requests   Final    BOTTLES DRAWN AEROBIC AND ANAEROBIC Blood Culture adequate volume   Culture   Final    NO GROWTH 5 DAYS Performed at [facility], [address], [place], [place] [address]    Report Status 08/25/2020 FINAL  Final  Resp Panel by RT-PCR (Flu A&B, Covid) Nasopharyngeal Swab     Status: None   Collection Time: 08/20/20  8:42 PM   Specimen: Nasopharyngeal Swab; Nasopharyngeal(NP) swabs in vial transport medium  Result Value Ref Range Status   SARS Coronavirus 2 by RT PCR NEGATIVE NEGATIVE Final    Comment: (NOTE) SARS-CoV-2 target nucleic acids are NOT DETECTED.  The SARS-CoV-2 RNA is generally detectable in upper respiratory specimens during the acute phase of infection. The lowest concentration of SARS-CoV-2 viral copies this assay can detect is 138 copies/mL. A negative result does not preclude SARS-Cov-2 infection and should not be used as the sole basis for treatment or other patient management decisions. A negative result may occur with  improper specimen collection/handling, submission of specimen other than nasopharyngeal swab, presence of viral mutation(s) within the areas targeted by this assay, and inadequate number of viral copies(<138 copies/mL). A negative result must be combined with clinical observations, patient history, and epidemiological information. The expected result is Negative.  Fact Sheet for Patients:  EntrepreneurPulse.com.au  Fact Sheet for Healthcare Providers:  IncredibleEmployment.be  This test is no t yet approved or cleared by the [place] FDA and  has been authorized for detection and/or diagnosis of SARS-CoV-2 by FDA under an  Emergency Use Authorization (EUA). This EUA will remain  in effect (meaning this test can be used) for the duration of the COVID-19 declaration under Section 564(b)(1) of the Act, 21 U.S.C.section 360bbb-3(b)(1), unless the authorization is terminated  or revoked sooner.       Influenza A by PCR NEGATIVE NEGATIVE Final   Influenza B by PCR NEGATIVE NEGATIVE Final    Comment: (NOTE) The Xpert Xpress SARS-CoV-2/FLU/RSV plus assay is intended as an aid in the diagnosis of influenza from Nasopharyngeal swab specimens and should not be used as a sole basis for treatment. Nasal washings and aspirates are unacceptable for Xpert Xpress SARS-CoV-2/FLU/RSV testing.  Fact Sheet for Patients: EntrepreneurPulse.com.au  Fact Sheet for Healthcare Providers: IncredibleEmployment.be  This test is not yet approved or cleared by the [place] FDA and has been authorized for detection and/or diagnosis of SARS-CoV-2 by FDA under an Emergency Use Authorization (EUA). This EUA will remain in effect (meaning this test can be used) for the duration of the COVID-19 declaration under Section 564(b)(1) of the Act, 21 U.S.C. section 360bbb-3(b)(1), unless the authorization is  terminated or revoked.  Performed at [facility], [address], [place], [place] [address]   Blood Culture (routine x 2)     Status: None   Collection Time: 08/20/20  8:42 PM   Specimen: Right Antecubital; Blood  Result Value Ref Range Status   Specimen Description RIGHT ANTECUBITAL  Final   Special Requests   Final    BOTTLES DRAWN AEROBIC AND ANAEROBIC Blood Culture adequate volume   Culture   Final    NO GROWTH 5 DAYS Performed at [facility], [address], [place], [place] [address]    Report Status 08/25/2020 FINAL  Final  Urine culture     Status: Abnormal   Collection Time: 08/20/20 10:31 PM   Specimen: Urine, Clean Catch  Result Value Ref Range Status   Specimen Description    Final    URINE, CLEAN CATCH Performed at [facility], [address], [place], [place] [address]    Special Requests   Final    NONE Performed at [facility], [address], [place], [place] [address]    Culture MULTIPLE SPECIES PRESENT, SUGGEST RECOLLECTION (A)  Final   Report Status 08/22/2020 FINAL  Final  MRSA Next Gen by PCR, Nasal     Status: None   Collection Time: 08/21/20  5:18 AM   Specimen: Nasal Mucosa; Nasal Swab  Result Value Ref Range Status   MRSA by PCR Next Gen NOT DETECTED NOT DETECTED Final    Comment: (NOTE) The GeneXpert MRSA Assay (FDA approved for NASAL specimens only), is one component of a comprehensive MRSA colonization surveillance program. It is not intended to diagnose MRSA infection nor to guide or monitor treatment for MRSA infections. Test performance is not FDA approved in patients less than 46 years old. Performed at [facility], [address], [place], [place] [address]   Urine culture     Status: None   Collection Time: 08/24/20  7:42 PM   Specimen: Urine, Catheterized  Result Value Ref Range Status   Specimen Description   Final    URINE, CATHETERIZED Performed at [facility], [address], [place], [place] [address]    Special Requests   Final    NONE Performed at [facility], [address], [place], [place] [address]    Culture   Final    NO GROWTH Performed at [facility], [place] [address], [place], [place] [address]    Report Status 08/26/2020 FINAL  Final  SARS CORONAVIRUS 2 (TAT 6-24 HRS) Nasopharyngeal Nasopharyngeal Swab     Status: None   Collection Time: 08/25/20  6:25 PM   Specimen: Nasopharyngeal Swab  Result Value Ref Range Status   SARS Coronavirus 2 NEGATIVE NEGATIVE Final    Comment: (NOTE) SARS-CoV-2 target nucleic acids are NOT DETECTED.  The SARS-CoV-2 RNA is generally detectable in upper and lower respiratory specimens during the acute phase of infection. Negative results do not preclude SARS-CoV-2  infection, do not rule out co-infections with other pathogens, and should not be used as the sole basis for treatment or other patient management decisions. Negative results must be combined with clinical observations, patient history, and epidemiological information. The expected result is Negative.  Fact Sheet for Patients: SugarRoll.be  Fact Sheet for Healthcare Providers: https://www.woods-mathews.com/  This test is not yet approved or cleared by the [place] FDA and  has been authorized for detection and/or diagnosis of SARS-CoV-2 by FDA under an Emergency Use Authorization (EUA). This EUA will remain  in effect (meaning this test can be used) for the duration of the COVID-19 declaration under Se ction 564(b)(1) of the Act, 21 U.S.C. section 360bbb-3(b)(1), unless the authorization is terminated or revoked sooner.  Performed at [facility], [place] [address], [place], [place] [address]      Labs: BNP (last 3 results) No results for input(s): BNP in the last 8760 hours. Basic Metabolic Panel: Recent Labs  Lab 08/22/20 0407 08/23/20 0606 08/24/20 1838 08/25/20 0323 08/26/20 0150  NA 139 139 142 144 142  K 4.2 4.2 3.9 3.7 3.4*  CL 109 108 110 112* 113*  CO2 23 25 23 24  20*  GLUCOSE 91 91 102* 85 127*  BUN 16 15 15 15 12   CREATININE 1.04* 0.98 1.09* 1.02* 1.05*  CALCIUM 8.4* 8.2* 8.6* 8.5* 8.6*  MG 1.5* 1.8  --   --  1.5*   Liver Function Tests: Recent Labs  Lab 08/24/20 1838  AST 22  ALT 23  ALKPHOS 54  BILITOT 0.4  PROT 6.3*  ALBUMIN 2.6*   No results for input(s): LIPASE, AMYLASE in the last 168 hours. Recent Labs  Lab 08/24/20 1838  AMMONIA 10   CBC: Recent Labs  Lab 08/22/20 0407 08/23/20 0606 08/24/20 1838 08/26/20 0150  WBC 5.0 5.7 7.1 8.7  NEUTROABS  --   --  4.1  --   HGB 9.0* 9.2* 10.7* 11.0*  HCT 30.5* 31.7* 35.7* 36.7  MCV 89.7 90.1 87.9 87.0  PLT 219 217 277 275   Cardiac  Enzymes: No results for input(s): CKTOTAL, CKMB, CKMBINDEX, TROPONINI in the last 168 hours. BNP: Invalid input(s): POCBNP CBG: Recent Labs  Lab 08/27/20 1954 08/27/20 2335 08/28/20 0407 08/28/20 0759 08/28/20 1207  GLUCAP 171* 158* 133* 109* 100*   D-Dimer No results for input(s): DDIMER in the last 72 hours. Hgb A1c No results for input(s): HGBA1C in the last 72 hours. Lipid Profile No results for input(s): CHOL, HDL, LDLCALC, TRIG, CHOLHDL, LDLDIRECT in the last 72 hours. Thyroid function studies No results for input(s): TSH, T4TOTAL, T3FREE, THYROIDAB in the last 72 hours.  Invalid input(s): FREET3 Anemia work up No results for input(s): VITAMINB12, FOLATE, FERRITIN, TIBC, IRON, RETICCTPCT in the last 72 hours. Urinalysis    Component Value Date/Time   COLORURINE STRAW (A) 08/24/2020 1942   APPEARANCEUR CLEAR 08/24/2020 1942   LABSPEC 1.008 08/24/2020 1942   PHURINE 6.0 08/24/2020 1942   GLUCOSEU NEGATIVE 08/24/2020 1942   HGBUR SMALL (A) 08/24/2020 1942   BILIRUBINUR NEGATIVE 08/24/2020 1942   KETONESUR 5 (A) 08/24/2020 1942   PROTEINUR 30 (A) 08/24/2020 1942   UROBILINOGEN 0.2 12/25/2014 1248   NITRITE NEGATIVE 08/24/2020 1942   LEUKOCYTESUR NEGATIVE 08/24/2020 1942   Sepsis Labs Invalid input(s): PROCALCITONIN,  WBC,  LACTICIDVEN Microbiology Recent Results (from the past 240 hour(s))  Blood Culture (routine x 2)     Status: None   Collection Time: 08/20/20  8:30 PM   Specimen: BLOOD LEFT ARM  Result Value Ref Range Status   Specimen Description BLOOD LEFT ARM  Final   Special Requests   Final    BOTTLES DRAWN AEROBIC AND ANAEROBIC Blood Culture adequate volume   Culture   Final    NO GROWTH 5 DAYS Performed at [facility], [address], [place], [place] [address]    Report Status 08/25/2020 FINAL  Final  Resp Panel by RT-PCR (Flu A&B, Covid) Nasopharyngeal Swab     Status: None   Collection Time: 08/20/20  8:42 PM   Specimen: Nasopharyngeal Swab;  Nasopharyngeal(NP) swabs in vial transport medium  Result Value Ref Range Status   SARS Coronavirus 2 by RT PCR NEGATIVE NEGATIVE Final    Comment: (NOTE) SARS-CoV-2 target nucleic acids are NOT DETECTED.  The SARS-CoV-2 RNA is generally detectable in upper respiratory specimens during the acute phase of infection. The lowest concentration of SARS-CoV-2 viral copies this assay can detect is 138 copies/mL. A negative result does not preclude SARS-Cov-2 infection and should not be used as the sole basis for treatment or other patient management decisions. A negative result may occur with  improper specimen collection/handling, submission of specimen other than nasopharyngeal swab, presence of viral mutation(s) within the areas targeted by this assay, and inadequate number of viral copies(<138 copies/mL). A negative result must be combined with clinical observations, patient history, and epidemiological information. The expected result is Negative.  Fact Sheet for Patients:  EntrepreneurPulse.com.au  Fact Sheet for Healthcare Providers:  IncredibleEmployment.be  This test is no t yet approved or cleared by the [place] FDA and  has been authorized for detection and/or diagnosis of SARS-CoV-2 by FDA under an Emergency Use Authorization (EUA). This EUA will remain  in effect (meaning this test can be used) for the duration of the COVID-19 declaration under Section 564(b)(1) of the Act, 21 U.S.C.section 360bbb-3(b)(1), unless the authorization is terminated  or revoked sooner.       Influenza A by PCR NEGATIVE NEGATIVE Final   Influenza B by PCR NEGATIVE NEGATIVE Final    Comment: (NOTE) The Xpert Xpress SARS-CoV-2/FLU/RSV plus assay is intended as an aid in the diagnosis of influenza from Nasopharyngeal swab specimens and should not be used as a sole basis for treatment. Nasal washings and aspirates are unacceptable for Xpert Xpress  SARS-CoV-2/FLU/RSV testing.  Fact Sheet for Patients: EntrepreneurPulse.com.au  Fact Sheet for Healthcare Providers: IncredibleEmployment.be  This test is not yet approved or cleared by the [place] FDA and has been authorized for detection and/or diagnosis of SARS-CoV-2 by FDA under an Emergency Use Authorization (EUA). This EUA will remain in effect (meaning this test can be used) for the duration of the COVID-19 declaration under Section 564(b)(1) of the Act, 21 U.S.C. section 360bbb-3(b)(1), unless the authorization is terminated or revoked.  Performed at [facility], [address], [place], [place] [address]   Blood Culture (routine x 2)     Status: None   Collection Time: 08/20/20  8:42 PM   Specimen: Right Antecubital; Blood  Result Value Ref Range Status   Specimen Description RIGHT ANTECUBITAL  Final   Special Requests   Final    BOTTLES DRAWN AEROBIC AND ANAEROBIC Blood Culture adequate volume   Culture   Final    NO GROWTH 5 DAYS Performed at [facility], [address], [place], [place] [address]    Report Status 08/25/2020 FINAL  Final  Urine culture     Status: Abnormal   Collection Time: 08/20/20 10:31 PM   Specimen: Urine, Clean Catch  Result Value Ref Range Status   Specimen Description   Final    URINE, CLEAN CATCH Performed at [facility], [address] [place], [place] [address]    Special Requests   Final    NONE Performed at [facility], [address], [place], [place] [address]    Culture MULTIPLE SPECIES PRESENT, SUGGEST RECOLLECTION (A)  Final   Report Status 08/22/2020 FINAL  Final  MRSA Next Gen by PCR, Nasal  Status: None   Collection Time: 08/21/20  5:18 AM   Specimen: Nasal Mucosa; Nasal Swab  Result Value Ref Range Status   MRSA by PCR Next Gen NOT DETECTED NOT DETECTED Final    Comment: (NOTE) The GeneXpert MRSA Assay (FDA approved for NASAL specimens only), is one component of a  comprehensive MRSA colonization surveillance program. It is not intended to diagnose MRSA infection nor to guide or monitor treatment for MRSA infections. Test performance is not FDA approved in patients less than 4 years old. Performed at [facility], [address] [place], [place] [address]   Urine culture     Status: None   Collection Time: 08/24/20  7:42 PM   Specimen: Urine, Catheterized  Result Value Ref Range Status   Specimen Description   Final    URINE, CATHETERIZED Performed at [facility], [address] [place], [place] [address]    Special Requests   Final    NONE Performed at [facility], [address], [place], [place] [address]    Culture   Final    NO GROWTH Performed at [facility], [place] [address], [place], [place] [address]    Report Status 08/26/2020 FINAL  Final  SARS CORONAVIRUS 2 (TAT 6-24 HRS) Nasopharyngeal Nasopharyngeal Swab     Status: None   Collection Time: 08/25/20  6:25 PM   Specimen: Nasopharyngeal Swab  Result Value Ref Range Status   SARS Coronavirus 2 NEGATIVE NEGATIVE Final    Comment: (NOTE) SARS-CoV-2 target nucleic acids are NOT DETECTED.  The SARS-CoV-2 RNA is generally detectable in upper and lower respiratory specimens during the acute phase of infection. Negative results do not preclude SARS-CoV-2 infection, do not rule out co-infections with other pathogens, and should not be used as the sole basis for treatment or other patient management decisions. Negative results must be combined with clinical observations, patient history, and epidemiological information. The expected result is Negative.  Fact Sheet for Patients: SugarRoll.be  Fact Sheet for Healthcare Providers: https://www.woods-mathews.com/  This test is not yet approved or cleared by the [place] FDA and  has been authorized for detection and/or diagnosis of SARS-CoV-2 by FDA under an Emergency Use  Authorization (EUA). This EUA will remain  in effect (meaning this test can be used) for the duration of the COVID-19 declaration under Se ction 564(b)(1) of the Act, 21 U.S.C. section 360bbb-3(b)(1), unless the authorization is terminated or revoked sooner.  Performed at [facility], [place] [address] [place], [place] [address]      Time coordinating discharge: 35 minutes  SIGNED:   Cordelia Poche, MD [facility] 08/28/2020, 12:39 PM

## 2020-08-28 NOTE — Evaluation (Signed)
Physical Therapy Evaluation/Discharge Patient Details Name: [name] MRN: [id number] DOB: 1950-07-31 Today's Date: 08/28/2020   History of Present Illness  70 y.o. female admitted on 08/24/20 for unresponsive episode.  Dx with acute metabolic encephalopathy.  Pt with significant PMH of DM2, HTN, CVA (residual R sided weakness), morbid obesity, anxiety, chronic neuropahty, pressure injury, PE.  Clinical Impression  Pt was fully participative in PT assessment and mobility.  She is max assist +2 for most mobility to EOB.  Her knee flexion contractures and significant strength deficits make it unsafe to attempt standing even with two person assist and/or standing aide.  Safest mode of transfer OOB to chair at this time is a hoyer lift.  I do think it is a realistic goal to be mobile at a WC level using a lift and to participate in seated activities at her SNF (she likes bingo).  I will defer any further therapy to SNF.  PT to sign off.     Follow Up Recommendations SNF    Equipment Recommendations  Hospital bed;Wheelchair (measurements PT);Wheelchair cushion (measurements PT);Other (comment) (hoyer lift)    Recommendations for Other Services       Precautions / Restrictions Precautions Precautions: Fall      Mobility  Bed Mobility Overal bed mobility: Needs Assistance Bed Mobility: Rolling;Supine to Sit;Sit to Supine Rolling: Max assist;+2 for physical assistance   Supine to sit: Max assist;HOB elevated Sit to supine: +2 for physical assistance;Max assist   General bed mobility comments: Two person total assist to roll bil for peri care after incontience of bowels. Max assist to come to sitting EOB using the bed and bed pad to help maneuver pt's body up.  Assist of second person needed to return to bed after sitting EOB.  Would need lift for OOB activity.    Transfers                    Ambulation/Gait                Stairs            Wheelchair Mobility     Modified Rankin (Stroke Patients Only)       Balance Overall balance assessment: Needs assistance Sitting-balance support: Feet supported;Bilateral upper extremity supported Sitting balance-Leahy Scale: Poor Sitting balance - Comments: Mod assist at first, once squared up with feet touching pt could hold herself momentarily with heavy reliance on bil UEs supported by bed rail to maintain traunk upright. Postural control: Posterior lean                                   Pertinent Vitals/Pain Pain Assessment: Faces Faces Pain Scale: Hurts even more Pain Location: bil knees with end ROM and back with sitting EOB. Pain Descriptors / Indicators: Grimacing;Guarding Pain Intervention(s): Limited activity within patient's tolerance;Monitored during session;Repositioned    Home Living Family/patient expects to be discharged to:: Skilled nursing facility                 Additional Comments: from SNF, was not active with PT, reports being transfered to [facility] and at times using feet to propel WC.  Likes to go to bingo.    Prior Function Level of Independence: Needs assistance   Gait / Transfers Assistance Needed: assist with transfers, non-ambulatory, WC level  ADL's / Homemaking Assistance Needed: total assist  Hand Dominance   Dominant Hand: Right (traditionally, stroke affected R side)    Extremity/Trunk Assessment   Upper Extremity Assessment Upper Extremity Assessment: RUE deficits/detail;LUE deficits/detail RUE Deficits / Details: right arm AROM to ~35 degrees of forward flexion 3+/5 elbow and grip LUE Deficits / Details: L UE with better active ROM and strength can reach her head on this side, but with some difficulty.  AROM forward flexion ~95 degrees, elbow, grip 4/5    Lower Extremity Assessment Lower Extremity Assessment: RLE deficits/detail;LLE deficits/detail RLE Deficits / Details: bil ankle 2/5 with limited ROM to fully neutral DF, knee  extension bil is limited by 35 degrees, with stregth in LEs 2/5 grossly per bed and EOB assessment.  Pt with painful bil knee flexion contractures that would likely not respond to agressive stretching due to pain response at end ROM with me. LLE Deficits / Details: bil ankle 2/5 with limited ROM to fully neutral DF, knee extension bil is limited by 35 degrees, with stregth in LEs 2/5 grossly per bed and EOB assessment.  Pt with painful bil knee flexion contractures that would likely not respond to agressive stretching due to pain response at end ROM with me.    Cervical / Trunk Assessment Cervical / Trunk Assessment: Kyphotic;Other exceptions Cervical / Trunk Exceptions: forward head, rounded shoulders, decreased UT/scap strength on R compared to L  Communication   Communication: No difficulties  Cognition Arousal/Alertness: Awake/alert Behavior During Therapy: WFL for tasks assessed/performed Overall Cognitive Status: Within Functional Limits for tasks assessed                                        General Comments General comments (skin integrity, edema, etc.): I encouraged pt to look towards her short term goal of being able to be up in her WC and participating in social activities like bingo at the SNF again.  I do not anticipate she will be able to walk given the extent of her bil knee flexion contractures and her size.    Exercises     Assessment/Plan    PT Assessment All further PT needs can be met in the next venue of care  PT Problem List Decreased strength;Decreased activity tolerance;Decreased range of motion;Decreased balance;Decreased mobility;Obesity;Decreased skin integrity;Pain       PT Treatment Interventions      PT Goals (Current goals can be found in the Care Plan section)  Acute Rehab PT Goals Patient Stated Goal: to walk again PT Goal Formulation: All assessment and education complete, DC therapy    Frequency     Barriers to discharge         Co-evaluation               AM-PAC PT "6 Clicks" Mobility  Outcome Measure Help needed turning from your back to your side while in a flat bed without using bedrails?: Total Help needed moving from lying on your back to sitting on the side of a flat bed without using bedrails?: Total Help needed moving to and from a bed to a chair (including a wheelchair)?: Total Help needed standing up from a chair using your arms (e.g., wheelchair or bedside chair)?: Total Help needed to walk in hospital room?: Total Help needed climbing 3-5 steps with a railing? : Total 6 Click Score: 6    End of Session   Activity Tolerance: Patient limited by pain;Patient limited  by fatigue Patient left: in bed;with call bell/phone within reach Nurse Communication: Mobility status PT Visit Diagnosis: Muscle weakness (generalized) (M62.81);Difficulty in walking, not elsewhere classified (R26.2);Pain Pain - Right/Left: Right (bil) Pain - part of body: Knee (bil knees)    Time: [id number] PT Time Calculation (min) (ACUTE ONLY): 54 min   Charges:   PT Evaluation $PT Eval Moderate Complexity: 1 Mod PT Treatments $Therapeutic Activity: 38-52 mins       Verdene Lennert, PT, DPT  Acute Rehabilitation Ortho Tech Supervisor [phone] pager [phone]) [phone] office

## 2020-11-11 ENCOUNTER — Ambulatory Visit (INDEPENDENT_AMBULATORY_CARE_PROVIDER_SITE_OTHER): Payer: Medicare (Managed Care) | Admitting: Neurology

## 2020-11-11 ENCOUNTER — Other Ambulatory Visit: Payer: Self-pay

## 2020-11-11 ENCOUNTER — Encounter: Payer: Self-pay | Admitting: Neurology

## 2020-11-11 DIAGNOSIS — E119 Type 2 diabetes mellitus without complications: Secondary | ICD-10-CM

## 2020-11-11 DIAGNOSIS — I639 Cerebral infarction, unspecified: Secondary | ICD-10-CM | POA: Diagnosis not present

## 2020-11-11 DIAGNOSIS — I10 Essential (primary) hypertension: Secondary | ICD-10-CM | POA: Diagnosis not present

## 2020-11-11 DIAGNOSIS — E785 Hyperlipidemia, unspecified: Secondary | ICD-10-CM | POA: Diagnosis not present

## 2020-11-11 DIAGNOSIS — Z87440 Personal history of urinary (tract) infections: Secondary | ICD-10-CM

## 2020-11-11 NOTE — Progress Notes (Signed)
[facility]  PATIENT: [name] DOB: May 03, 1950  REFERRING CLINICIAN: Caprice Renshaw, MD HISTORY FROM: Patient and chart review  REASON FOR VISIT: Slurred speech X2.    HISTORICAL  CHIEF COMPLAINT:  Chief Complaint  Patient presents with   New Patient (Initial Visit)    Rm 12, here to discuss possible TIA/CVA or seizure like activity, pt states she feels well today, with [employer], pt in wheelchair     HISTORY OF PRESENT ILLNESS:  This is a 70 year old woman with past medical history of PE on Xarelto, type 2 diabetes, hypertension, hyperlipidemia, prior stroke in June 2020 with chronic right-sided deficit and she is wheelchair-bound who is presenting today for 2 episodes of slurred speech and mental status changes.  Patient stated the first episode was in May 2022 when she was noted to have slurred speech and word finding difficulty.  She was brought to the ED and a stroke code was called, stroke work-up was done including CT and MRI which was negative for any acute stroke.  However she had a urinalysis which was consistent with acute urinary tract infection, she was started on Rocephin and sent back to her nursing home. Patient stated the second episode was in the month of July, again she was on the phone and noted that her speech was slurred and people could not understand her, per chart review at that time she was being treated for UTI and she had completed 3 days of Rocephin.  She presented to the ED, she was admitted for metabolic encephalopathy and her Rocephin was switched to cefepime and patient has improvement of her mental status in 1 to 2 days.  She was discharged home.  She presented again the next day for worsening mental status, she was admitted and had a EEG which showed moderate to severe diffuse encephalopathy, no seizures no epileptiform discharge.  She was evaluated by neurology who believed that the encephalopathy was secondary to cefepime.   She had completed her last dose of cefepime that day, patient was observed for couple more days and with improvement of her mental status then discharged home. Since being discharged patient reported that he might have had 1 episode of slurred speech which lasted 5 minutes but other than that no new weakness no worsening of the speech and no other complaint.  She reported being compliant with her medication.   OTHER MEDICAL CONDITIONS: Left MCA stroke with right-sided deficit in 2020, PE on Xarelto, type 2 diabetes, hypertension, hyperlipidemia.   REVIEW OF SYSTEMS: Full 14 system review of systems performed and negative with exception of: as noted in the HPI.   ALLERGIES: Allergies  Allergen Reactions   Vancomycin Hives and Rash    "big red bumps, then they turned white, then I shedded like a snake."     HOME MEDICATIONS: Outpatient Medications Prior to Visit  Medication Sig Dispense Refill   ammonium lactate (LAC-HYDRIN) 12 % lotion Apply topically 2 (two) times daily. 400 g 0   aspirin 81 MG chewable tablet Chew 1 tablet (81 mg total) by mouth daily.     atorvastatin (LIPITOR) 40 MG tablet Take 40 mg by mouth daily.     cholecalciferol (VITAMIN D3) 25 MCG (1000 UNIT) tablet Take 1,000 Units by mouth daily.     ELIQUIS 5 MG TABS tablet Take 5 mg by mouth 2 (two) times daily.     feeding supplement, GLUCERNA SHAKE, (GLUCERNA SHAKE) LIQD Take 237 mLs by mouth 3 (  three) times daily between meals. 237 mL 0   ferrous sulfate 325 (65 FE) MG tablet Take 325 mg by mouth daily with breakfast.     fluticasone (FLONASE) 50 MCG/ACT nasal spray Place 1 spray into both nostrils daily.     gabapentin (NEURONTIN) 100 MG capsule Take 100 mg by mouth 3 (three) times daily.     lisinopril (ZESTRIL) 30 MG tablet Take 15 mg by mouth daily.     metFORMIN (GLUCOPHAGE) 1000 MG tablet Take 1 tablet by mouth 2 (two) times a day.     oxyCODONE-acetaminophen (PERCOCET/ROXICET) 5-325 MG tablet Take 1 tablet by  mouth 2 (two) times daily as needed.     senna (SENOKOT) 8.6 MG TABS tablet Take 1 tablet (8.6 mg total) by mouth daily as needed for mild constipation. 120 each 0   verapamil (CALAN-SR) 180 MG CR tablet Take 180 mg by mouth daily.     vitamin C (ASCORBIC ACID) 500 MG tablet Take 500 mg by mouth daily.     XARELTO 20 MG TABS Take 20 mg by mouth daily with supper.      mirtazapine (REMERON) 7.5 MG tablet Take 1 tablet (7.5 mg total) by mouth at bedtime. 30 tablet 0   No facility-administered medications prior to visit.    PAST MEDICAL HISTORY: Past Medical History:  Diagnosis Date   Cellulitis lower extremities   4 day hospitalization 12/25/14-12/29/14   Essential hypertension, benign    History of MRSA infection    History of panniculitis    Pulmonary emboli ([place]) 2009   Skin ulcer of thigh (HCC)    Type 2 diabetes mellitus ([place])     PAST SURGICAL HISTORY: Past Surgical History:  Procedure Laterality Date   PARTIAL HYSTERECTOMY     Right thigh ulcer debridement  2008   Two occasions - wound VAC    FAMILY HISTORY: Family History  Problem Relation Age of Onset   Cancer - Colon Father    Heart disease Mother    Diabetes Mellitus II Mother     SOCIAL HISTORY: Social History   Socioeconomic History   Marital status: Married    Spouse name: Not on file   Number of children: Not on file   Years of education: Not on file   Highest education level: Not on file  Occupational History   Not on file  Tobacco Use   Smoking status: Never   Smokeless tobacco: Never  Vaping Use   Vaping Use: Never used  Substance and Sexual Activity   Alcohol use: No   Drug use: No   Sexual activity: Not on file  Other Topics Concern   Not on file  Social History Narrative   Not on file   Social Determinants of Health   Financial Resource Strain: Not on file  Food Insecurity: Not on file  Transportation Needs: Not on file  Physical Activity: Not on file  Stress: Not on file  Social  Connections: Not on file  Intimate Partner Violence: Not on file     PHYSICAL EXAM  GENERAL EXAM/CONSTITUTIONAL: Vitals: There were no vitals filed for this visit. There is no height or weight on file to calculate BMI. Wt Readings from Last 3 Encounters:  08/24/20 244 lb 7.8 oz (110.9 kg)  08/21/20 244 lb 7.8 oz (110.9 kg)  07/20/20 268 lb 8.3 oz (121.8 kg)   Patient is in no distress; well developed, nourished and groomed; neck is supple  CARDIOVASCULAR: Examination of carotid arteries  is normal; no carotid bruits Regular rate and rhythm, no murmurs Examination of peripheral vascular system by observation and palpation is normal  EYES: Pupils round and reactive to light, Visual fields full to confrontation, Extraocular movements intacts,   MUSCULOSKELETAL: Gait, strength, tone, movements noted in Neurologic exam below  NEUROLOGIC: MENTAL STATUS: awake, alert, oriented to person, place and time recent and remote memory intact 1/3 recall, able to count the number of quarter in $1.75 and able to state the days of the week backward.  language fluent, comprehension intact, naming intact fund of knowledge appropriate  CRANIAL NERVE:  2nd, 3rd, 4th, 6th - pupils equal and reactive to light, visual fields full to confrontation, extraocular muscles intact, no nystagmus 5th - facial sensation symmetric 7th - facial strength symmetric 8th - hearing intact 9th - palate elevates symmetrically, uvula midline 11th - shoulder shrug symmetric 12th - tongue protrusion midline  MOTOR:  BUE 4/5, unable to lift both arms past 90%, right worse than left.  3/5 BLE  SENSORY:  normal and symmetric to light touch, pinprick, temperature, vibration  COORDINATION:  finger-nose-finger, fine finger movements normal  GAIT/STATION:  Wheelchair bound    DIAGNOSTIC DATA (LABS, IMAGING, TESTING) - I reviewed patient records, labs, notes, testing and imaging myself where available.  Lab  Results  Component Value Date   WBC 8.7 08/26/2020   HGB 11.0 (L) 08/26/2020   HCT 36.7 08/26/2020   MCV 87.0 08/26/2020   PLT 275 08/26/2020      Component Value Date/Time   NA 142 08/26/2020 0150   K 3.4 (L) 08/26/2020 0150   CL 113 (H) 08/26/2020 0150   CO2 20 (L) 08/26/2020 0150   GLUCOSE 127 (H) 08/26/2020 0150   BUN 12 08/26/2020 0150   CREATININE 1.05 (H) 08/26/2020 0150   CALCIUM 8.6 (L) 08/26/2020 0150   PROT 6.3 (L) 08/24/2020 1838   ALBUMIN 2.6 (L) 08/24/2020 1838   AST 22 08/24/2020 1838   ALT 23 08/24/2020 1838   ALKPHOS 54 08/24/2020 1838   BILITOT 0.4 08/24/2020 1838   GFRNONAA 57 (L) 08/26/2020 0150   GFRAA 50 (L) 07/25/2018 0510   Lab Results  Component Value Date   CHOL 111 07/25/2018   HDL 44 07/25/2018   LDLCALC 53 07/25/2018   TRIG 71 07/25/2018   CHOLHDL 2.5 07/25/2018   Lab Results  Component Value Date   HGBA1C 5.4 08/21/2020   Lab Results  Component Value Date   VITAMINB12 1,078 (H) 07/24/2018   Lab Results  Component Value Date   TSH 0.794 04/09/2015    MRI brain 07/20/20: 1. No evidence of acute intracranial abnormality. 2. Redemonstrated chronic cortically-based infarct within the mid left frontal lobe/left frontal operculum. 3. Redemonstrated chronic small-vessel infarcts within the left corona radiata, left basal ganglia, left thalamus and left pons. 4. Stable background mild generalized parenchymal atrophy and moderate chronic small vessel ischemic disease, as described. 5. Chronic microhemorrhages within the cerebellum, brainstem and right thalamus. 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    MRA head 07/20/20: 1. No intracranial large vessel occlusion or proximal high-grade arterial stenosis. 2. Mild atherosclerotic irregularity of the intracranial internal carotid arteries. 3. Atherosclerotic irregularity of the M2 and more distal middle cerebral artery vessels, bilaterally.   Head CT  08/24/20: No evidence of acute intracranial abnormality. Old left basal ganglia lacunar infarct. Small vessel ischemic changes.   Routine EEG 08/25/20 This study is suggestive of moderate to severe diffuse encephalopathy, nonspecific etiology  but could be secondary to toxic, metabolic etiology. No seizures or definite epileptiform discharges were seen throughout the recording.    ASSESSMENT AND PLAN  70 y.o. year old female with multiple vascular risk factor including diabetes, hypertension, hyperlipidemia, PE on Xarelto who is presenting for follow-up after being admitted to the hospital on 2 occasion for slurred speech and right-sided weakness.  The first admission was in the month of May when she presented after slurred speech and encephalopathy and was found to have a UTI, she was treated appropriately and her symptoms improved.  She presented again in July for slurred speech and encephalopathy in the setting of being treated with Rocephin for UTI.  She was switched from Rocephin to cefepime discharged home but presented the day next day for worsening mental status and was deemed to have a cefepime encephalopathy.  At that time she had a Noncon head CT which was negative for acute stroke and her EEG showed evidence of moderate encephalopathy.  She finished a course of cefepime and her symptoms improved after couple days.  Patient will presentation was secondary to encephalopathy from UTI and from complication of UTI, cefepime encephalopathy.  I informed patient that she does have stroke risk factors but they are well controlled.  Her diabetes, hypertension, and hyperlipidemia are well controlled and she is on Xarelto for PE.  She appears very deconditioned on exam, appears weak with has limited range of motion.  I strongly advised patient to start physical and occupational therapy.  I will see her in 1 year for follow-up.    1. Cerebrovascular accident (CVA), unspecified mechanism ([place])   2. Essential  hypertension, benign   3. Type 2 diabetes mellitus without complication, without long-term current use of insulin ([place])   4. Hyperlipidemia, unspecified hyperlipidemia type   5. History of UTI     PLAN: Continue all medications  Strongly recommend physical and occupational therapies  Return in 1 year  No orders of the defined types were placed in this encounter.   No orders of the defined types were placed in this encounter.   Return in about 1 year (around 11/11/2021).    Alric Ran, MD 11/11/2020, 9:48 AM  [facility] [address] [place], [place] [address] [phone]

## 2020-11-11 NOTE — Patient Instructions (Signed)
Continue all medications  Strongly recommend physical and occupational therapies  Return in 1 year

## 2021-08-29 ENCOUNTER — Inpatient Hospital Stay (HOSPITAL_COMMUNITY): Payer: Medicare (Managed Care)

## 2021-08-29 ENCOUNTER — Other Ambulatory Visit: Payer: Self-pay

## 2021-08-29 ENCOUNTER — Emergency Department (HOSPITAL_COMMUNITY): Payer: Medicare (Managed Care)

## 2021-08-29 ENCOUNTER — Encounter (HOSPITAL_COMMUNITY): Payer: Self-pay

## 2021-08-29 ENCOUNTER — Inpatient Hospital Stay (HOSPITAL_COMMUNITY)
Admission: EM | Admit: 2021-08-29 | Discharge: 2021-09-06 | DRG: 064 | Disposition: A | Discharge status: Skilled Nursing Facility | Payer: Medicare (Managed Care) | Attending: Internal Medicine | Admitting: Internal Medicine

## 2021-08-29 DIAGNOSIS — I161 Hypertensive emergency: Secondary | ICD-10-CM | POA: Diagnosis present

## 2021-08-29 DIAGNOSIS — Z881 Allergy status to other antibiotic agents status: Secondary | ICD-10-CM

## 2021-08-29 DIAGNOSIS — E1122 Type 2 diabetes mellitus with diabetic chronic kidney disease: Secondary | ICD-10-CM | POA: Diagnosis present

## 2021-08-29 DIAGNOSIS — E785 Hyperlipidemia, unspecified: Secondary | ICD-10-CM | POA: Diagnosis present

## 2021-08-29 DIAGNOSIS — E669 Obesity, unspecified: Secondary | ICD-10-CM | POA: Diagnosis present

## 2021-08-29 DIAGNOSIS — R29716 NIHSS score 16: Secondary | ICD-10-CM | POA: Diagnosis present

## 2021-08-29 DIAGNOSIS — D509 Iron deficiency anemia, unspecified: Secondary | ICD-10-CM | POA: Diagnosis present

## 2021-08-29 DIAGNOSIS — I6389 Other cerebral infarction: Secondary | ICD-10-CM | POA: Diagnosis not present

## 2021-08-29 DIAGNOSIS — Z8673 Personal history of transient ischemic attack (TIA), and cerebral infarction without residual deficits: Secondary | ICD-10-CM

## 2021-08-29 DIAGNOSIS — G8194 Hemiplegia, unspecified affecting left nondominant side: Secondary | ICD-10-CM | POA: Diagnosis present

## 2021-08-29 DIAGNOSIS — N179 Acute kidney failure, unspecified: Secondary | ICD-10-CM | POA: Diagnosis present

## 2021-08-29 DIAGNOSIS — N1831 Chronic kidney disease, stage 3a: Secondary | ICD-10-CM | POA: Diagnosis present

## 2021-08-29 DIAGNOSIS — R509 Fever, unspecified: Secondary | ICD-10-CM | POA: Diagnosis not present

## 2021-08-29 DIAGNOSIS — Z6835 Body mass index (BMI) 35.0-35.9, adult: Secondary | ICD-10-CM | POA: Diagnosis not present

## 2021-08-29 DIAGNOSIS — D72829 Elevated white blood cell count, unspecified: Secondary | ICD-10-CM | POA: Diagnosis present

## 2021-08-29 DIAGNOSIS — R1310 Dysphagia, unspecified: Secondary | ICD-10-CM | POA: Diagnosis present

## 2021-08-29 DIAGNOSIS — E870 Hyperosmolality and hypernatremia: Secondary | ICD-10-CM | POA: Diagnosis not present

## 2021-08-29 DIAGNOSIS — Z7984 Long term (current) use of oral hypoglycemic drugs: Secondary | ICD-10-CM

## 2021-08-29 DIAGNOSIS — R471 Dysarthria and anarthria: Secondary | ICD-10-CM | POA: Diagnosis present

## 2021-08-29 DIAGNOSIS — Z20822 Contact with and (suspected) exposure to covid-19: Secondary | ICD-10-CM | POA: Diagnosis present

## 2021-08-29 DIAGNOSIS — Z7985 Long-term (current) use of injectable non-insulin antidiabetic drugs: Secondary | ICD-10-CM

## 2021-08-29 DIAGNOSIS — R4182 Altered mental status, unspecified: Secondary | ICD-10-CM | POA: Diagnosis present

## 2021-08-29 DIAGNOSIS — Z833 Family history of diabetes mellitus: Secondary | ICD-10-CM

## 2021-08-29 DIAGNOSIS — Z86718 Personal history of other venous thrombosis and embolism: Secondary | ICD-10-CM

## 2021-08-29 DIAGNOSIS — I610 Nontraumatic intracerebral hemorrhage in hemisphere, subcortical: Secondary | ICD-10-CM | POA: Diagnosis not present

## 2021-08-29 DIAGNOSIS — I129 Hypertensive chronic kidney disease with stage 1 through stage 4 chronic kidney disease, or unspecified chronic kidney disease: Secondary | ICD-10-CM | POA: Diagnosis present

## 2021-08-29 DIAGNOSIS — E876 Hypokalemia: Secondary | ICD-10-CM | POA: Diagnosis not present

## 2021-08-29 DIAGNOSIS — Z79899 Other long term (current) drug therapy: Secondary | ICD-10-CM

## 2021-08-29 DIAGNOSIS — Z7982 Long term (current) use of aspirin: Secondary | ICD-10-CM

## 2021-08-29 DIAGNOSIS — Z8249 Family history of ischemic heart disease and other diseases of the circulatory system: Secondary | ICD-10-CM

## 2021-08-29 DIAGNOSIS — Z7901 Long term (current) use of anticoagulants: Secondary | ICD-10-CM

## 2021-08-29 DIAGNOSIS — Z993 Dependence on wheelchair: Secondary | ICD-10-CM

## 2021-08-29 DIAGNOSIS — G9341 Metabolic encephalopathy: Secondary | ICD-10-CM | POA: Diagnosis present

## 2021-08-29 DIAGNOSIS — N3000 Acute cystitis without hematuria: Secondary | ICD-10-CM

## 2021-08-29 DIAGNOSIS — Z86711 Personal history of pulmonary embolism: Secondary | ICD-10-CM

## 2021-08-29 DIAGNOSIS — I619 Nontraumatic intracerebral hemorrhage, unspecified: Principal | ICD-10-CM | POA: Diagnosis present

## 2021-08-29 DIAGNOSIS — R569 Unspecified convulsions: Secondary | ICD-10-CM | POA: Diagnosis not present

## 2021-08-29 LAB — URINALYSIS, MICROSCOPIC (REFLEX)

## 2021-08-29 LAB — COMPREHENSIVE METABOLIC PANEL
ALT: 10 U/L (ref 0–44)
AST: 11 U/L — ABNORMAL LOW (ref 15–41)
Albumin: 3 g/dL — ABNORMAL LOW (ref 3.5–5.0)
Alkaline Phosphatase: 65 U/L (ref 38–126)
Anion gap: 12 (ref 5–15)
BUN: 18 mg/dL (ref 8–23)
CO2: 24 mmol/L (ref 22–32)
Calcium: 7.8 mg/dL — ABNORMAL LOW (ref 8.9–10.3)
Chloride: 106 mmol/L (ref 98–111)
Creatinine, Ser: 1.14 mg/dL — ABNORMAL HIGH (ref 0.44–1.00)
GFR, Estimated: 51 mL/min — ABNORMAL LOW (ref >60)
Glucose, Bld: 133 mg/dL — ABNORMAL HIGH (ref 70–99)
Potassium: 3.4 mmol/L — ABNORMAL LOW (ref 3.5–5.1)
Sodium: 142 mmol/L (ref 135–145)
Total Bilirubin: 0.9 mg/dL (ref 0.3–1.2)
Total Protein: 7.3 g/dL (ref 6.5–8.1)

## 2021-08-29 LAB — CBC WITH DIFFERENTIAL/PLATELET
Abs Immature Granulocytes: 0.06 10*3/uL (ref 0.00–0.07)
Basophils Absolute: 0 10*3/uL (ref 0.0–0.1)
Basophils Relative: 0 %
Eosinophils Absolute: 0 10*3/uL (ref 0.0–0.5)
Eosinophils Relative: 0 %
HCT: 38.7 % (ref 36.0–46.0)
Hemoglobin: 11.5 g/dL — ABNORMAL LOW (ref 12.0–15.0)
Immature Granulocytes: 1 %
Lymphocytes Relative: 7 %
Lymphs Abs: 0.7 10*3/uL (ref 0.7–4.0)
MCH: 26 pg (ref 26.0–34.0)
MCHC: 29.7 g/dL — ABNORMAL LOW (ref 30.0–36.0)
MCV: 87.4 fL (ref 80.0–100.0)
Monocytes Absolute: 0.2 10*3/uL (ref 0.1–1.0)
Monocytes Relative: 1 %
Neutro Abs: 10.3 10*3/uL — ABNORMAL HIGH (ref 1.7–7.7)
Neutrophils Relative %: 91 %
Platelets: 327 10*3/uL (ref 150–400)
RBC: 4.43 MIL/uL (ref 3.87–5.11)
RDW: 15.7 % — ABNORMAL HIGH (ref 11.5–15.5)
WBC: 11.3 10*3/uL — ABNORMAL HIGH (ref 4.0–10.5)
nRBC: 0 % (ref 0.0–0.2)

## 2021-08-29 LAB — APTT: aPTT: 29 seconds (ref 24–36)

## 2021-08-29 LAB — HEMOGLOBIN A1C
Hgb A1c MFr Bld: 5.6 % (ref 4.8–5.6)
Mean Plasma Glucose: 114.02 mg/dL

## 2021-08-29 LAB — PROTIME-INR
INR: 1.4 — ABNORMAL HIGH (ref 0.8–1.2)
Prothrombin Time: 16.8 seconds — ABNORMAL HIGH (ref 11.4–15.2)

## 2021-08-29 LAB — RESP PANEL BY RT-PCR (FLU A&B, COVID) ARPGX2
Influenza A by PCR: NEGATIVE
Influenza B by PCR: NEGATIVE
SARS Coronavirus 2 by RT PCR: NEGATIVE

## 2021-08-29 LAB — LIPID PANEL
Cholesterol: 112 mg/dL (ref 0–200)
HDL: 58 mg/dL (ref >40)
LDL Cholesterol: 40 mg/dL (ref 0–99)
Total CHOL/HDL Ratio: 1.9 RATIO
Triglycerides: 69 mg/dL (ref <150)
VLDL: 14 mg/dL (ref 0–40)

## 2021-08-29 LAB — LACTIC ACID, PLASMA
Lactic Acid, Venous: 1.2 mmol/L (ref 0.5–1.9)
Lactic Acid, Venous: 1.1 mmol/L (ref 0.5–1.9)

## 2021-08-29 LAB — URINALYSIS, ROUTINE W REFLEX MICROSCOPIC
Glucose, UA: NEGATIVE mg/dL
Ketones, ur: NEGATIVE mg/dL
Leukocytes,Ua: NEGATIVE
Nitrite: NEGATIVE
Protein, ur: 100 mg/dL — AB
Specific Gravity, Urine: >1.03 — ABNORMAL HIGH (ref 1.005–1.030)
pH: 6 (ref 5.0–8.0)

## 2021-08-29 LAB — MRSA NEXT GEN BY PCR, NASAL: MRSA by PCR Next Gen: DETECTED — AB

## 2021-08-29 MED ORDER — CLEVIDIPINE BUTYRATE 0.5 MG/ML IV EMUL
0.0000 mg/h | INTRAVENOUS | Status: DC
  Administered 2021-08-29 – 2021-08-30 (×2): 2 mg/h via INTRAVENOUS
  Administered 2021-08-30: 5 mg/h via INTRAVENOUS
  Administered 2021-08-30: 4 mg/h via INTRAVENOUS
  Administered 2021-08-30: 8 mg/h via INTRAVENOUS
  Filled 2021-08-29 (×4): qty 50

## 2021-08-29 MED ORDER — SENNOSIDES-DOCUSATE SODIUM 8.6-50 MG PO TABS
1.0000 | ORAL_TABLET | Freq: Two times a day (BID) | ORAL | Status: DC
  Administered 2021-09-04 (×2): 1 via ORAL
  Filled 2021-08-29 (×4): qty 1

## 2021-08-29 MED ORDER — STROKE: EARLY STAGES OF RECOVERY BOOK
Freq: Once | Status: AC
  Filled 2021-08-29: qty 1

## 2021-08-29 MED ORDER — PANTOPRAZOLE SODIUM 40 MG IV SOLR
40.0000 mg | Freq: Every day | INTRAVENOUS | Status: DC
  Administered 2021-08-29 – 2021-09-04 (×7): 40 mg via INTRAVENOUS
  Filled 2021-08-29 (×6): qty 10

## 2021-08-29 MED ORDER — EMPTY CONTAINERS FLEXIBLE MISC
1800.0000 mg | Freq: Once | Status: AC
  Administered 2021-08-29: 1800 mg via INTRAVENOUS
  Filled 2021-08-29: qty 180

## 2021-08-29 MED ORDER — GADOBUTROL 1 MMOL/ML IV SOLN
10.0000 mL | Freq: Once | INTRAVENOUS | Status: AC | PRN
  Administered 2021-08-29: 10 mL via INTRAVENOUS

## 2021-08-29 MED ORDER — SODIUM CHLORIDE 0.9 % IV SOLN: INTRAVENOUS | Status: AC

## 2021-08-29 MED ORDER — ORAL CARE MOUTH RINSE
15.0000 mL | OROMUCOSAL | Status: DC | PRN
Start: 2021-08-29 — End: 2021-08-30

## 2021-08-29 MED ORDER — LACTATED RINGERS IV BOLUS (SEPSIS)
1000.0000 mL | Freq: Once | INTRAVENOUS | Status: AC
Start: 2021-08-29 — End: 2021-08-29
  Administered 2021-08-29: 1000 mL via INTRAVENOUS

## 2021-08-29 MED ORDER — ACETAMINOPHEN 650 MG RE SUPP
650.0000 mg | RECTAL | Status: DC | PRN
  Administered 2021-08-29 – 2021-08-30 (×2): 650 mg via RECTAL
  Filled 2021-08-29 (×2): qty 1

## 2021-08-29 MED ORDER — ACETAMINOPHEN 160 MG/5ML PO SOLN: 650.0000 mg | ORAL | Status: DC | PRN

## 2021-08-29 MED ORDER — CLEVIDIPINE BUTYRATE 0.5 MG/ML IV EMUL
0.0000 mg/h | INTRAVENOUS | Status: DC
  Administered 2021-08-29: 2 mg/h via INTRAVENOUS
  Filled 2021-08-29 (×2): qty 50

## 2021-08-29 MED ORDER — ACETAMINOPHEN 500 MG PO TABS
1000.0000 mg | ORAL_TABLET | Freq: Once | ORAL | Status: AC
  Administered 2021-08-29: 1000 mg via ORAL
  Filled 2021-08-29: qty 2

## 2021-08-29 MED ORDER — INSULIN ASPART 100 UNIT/ML IJ SOLN
0.0000 [IU] | INTRAMUSCULAR | Status: DC
  Administered 2021-08-30 – 2021-09-03 (×13): 2 [IU] via SUBCUTANEOUS
  Administered 2021-09-03: 8 [IU] via SUBCUTANEOUS
  Administered 2021-09-03 (×2): 2 [IU] via SUBCUTANEOUS

## 2021-08-29 MED ORDER — MUPIROCIN 2 % EX OINT
1.0000 | TOPICAL_OINTMENT | Freq: Two times a day (BID) | CUTANEOUS | Status: AC
  Administered 2021-08-30 – 2021-09-03 (×10): 1 via NASAL
  Filled 2021-08-29: qty 22

## 2021-08-29 MED ORDER — CHLORHEXIDINE GLUCONATE CLOTH 2 % EX PADS
6.0000 | MEDICATED_PAD | Freq: Every day | CUTANEOUS | Status: DC
  Administered 2021-08-29 – 2021-09-05 (×7): 6 via TOPICAL

## 2021-08-29 MED ORDER — SODIUM CHLORIDE 0.9 % IV SOLN
2.0000 g | Freq: Once | INTRAVENOUS | Status: AC
  Administered 2021-08-29: 2 g via INTRAVENOUS
  Filled 2021-08-29: qty 20

## 2021-08-29 MED ORDER — ORAL CARE MOUTH RINSE
15.0000 mL | OROMUCOSAL | Status: DC
Start: 2021-08-29 — End: 2021-08-30
  Administered 2021-08-29: 15 mL via OROMUCOSAL

## 2021-08-29 MED ORDER — IOHEXOL 350 MG/ML SOLN
100.0000 mL | Freq: Once | INTRAVENOUS | Status: AC | PRN
  Administered 2021-08-29: 80 mL via INTRAVENOUS

## 2021-08-29 MED ORDER — ACETAMINOPHEN 325 MG PO TABS: 650.0000 mg | ORAL_TABLET | ORAL | Status: DC | PRN

## 2021-08-29 NOTE — ED Notes (Signed)
Dr. Darlyn Chamber of bp above 140.

## 2021-08-29 NOTE — ED Notes (Signed)
Son has all pt belongings. Glasses and wedding ring on pt.

## 2021-08-29 NOTE — ED Notes (Signed)
Attempted to call [facility] home for update. No one answered only voicemail.

## 2021-08-29 NOTE — H&P (Addendum)
Neurology H&P   CC: ICH  History is obtained from: Chart review  HPI: [name] is a 71 y.o. female past medical history of PE on anticoagulation with Eliquis, diabetes, hypertension, prior strokes with unclear residual deficits, panniculitis and cellulitis, with an unclear last known well-possibly few days because she has not been eating much, brought for evaluation at [facility] and a CT head done revealed a right hippocampal hemorrhage with no IVH.  Extensive small vessel ischemic disease.  CT angio chest negative for PE. Transferred to [facility] for further management. She was evaluated in the past for some episodes of expressive aphasia without evidence of stroke on MRI.  EEG in July 2022 moderate to severe diffuse encephalopathy nonspecific etiology. Outpatient follow-up with Dr. April Manson in September 2022 for the episodes of slurred speech/aphasia x2 reports patient chronically wheelchair-bound with barely antigravity in lower extremity strength and 4/5 bilateral upper extremity strength, poor recall but at baseline awake and oriented to person. No family at bedside.  Family unreachable by phone at this time.  After the CT findings of the ICH, she was given Andexxa for reversal. Cleviprex started to control blood pressure she was hypertensive on arrival.  LKW: Unclear IV thrombolysis given?: no, ICH Premorbid modified Rankin scale (mRS): 4-5 ICH score 1 for GCS of 10.  Review of systems: Unable to obtain due to altered mental status.   Past Medical History:  Diagnosis Date   Cellulitis lower extremities   4 day hospitalization 12/25/14-12/29/14   Essential hypertension, benign    History of MRSA infection    History of panniculitis    Pulmonary emboli ([place]) 2009   Skin ulcer of thigh (HCC)    Type 2 diabetes mellitus ([place])      Family History  Problem Relation Age of Onset   Cancer - Colon Father    Heart disease Mother    Diabetes Mellitus II Mother       Social History:   reports that she has never smoked. She has never used smokeless tobacco. She reports that she does not drink alcohol and does not use drugs.  Medications  Current Facility-Administered Medications:    Chlorhexidine Gluconate Cloth 2 % PADS 6 each, 6 each, Topical, Daily, Amie Portland, MD, 6 each at 08/29/21 1830   clevidipine (CLEVIPREX) infusion 0.5 mg/mL, 0-21 mg/hr, Intravenous, Continuous, Isla Pence, MD, Stopped at 08/29/21 1737   Oral care mouth rinse, 15 mL, Mouth Rinse, 4 times per day, Amie Portland, MD   Oral care mouth rinse, 15 mL, Mouth Rinse, PRN, Amie Portland, MD   Exam: Current vital signs: BP 117/72   Pulse (!) 120   Temp 99.9 F (37.7 C) (Oral)   Resp 20   Ht '5\' 6"'$  (1.676 m)   Wt 101 kg   SpO2 100%   BMI 35.94 kg/m  Vital signs in last 24 hours: Temp:  [99.9 F (37.7 C)-100.2 F (37.9 C)] 99.9 F (37.7 C) (07/10 1701) Pulse Rate:  [91-154] 120 (07/10 1740) Resp:  [12-24] 20 (07/10 1740) BP: (79-192)/(30-95) 117/72 (07/10 1740) SpO2:  [92 %-100 %] 100 % (07/10 1740) Weight:  [101 kg] 101 kg (07/10 1137) General: Patient is awake, alert but does not talk. HEENT: Normocephalic atraumatic Lungs: Clear Cardiovascular: Tachycardic, regular Abdomen obese Extremities with extreme fat and extremities Neurologic exam She is awake, alert She attends to the examiner to both sides but does not give any verbal responses Cranial: Pupils are equal round react  light, extraocular movements appear unhindered, blinks to threat from both sides, face appears symmetric. Motor exam: Appears to be weaker on the left upper extremity in comparison to the right upper extremity and has decreased range of motion in both upper extremities on the shoulder.  Bilateral lower extremities-do not go antigravity and to noxious stimulation has weak withdrawal bilaterally. Sensation: As above Coordination difficult to assess given her mentation and ability to  follow commands NIH stroke scale 1a Level of Conscious.: 0 1b LOC Questions: 2 1c LOC Commands: 0 2 Best Gaze: 0 3 Visual: 0 4 Facial Palsy: 0 5a Motor Arm - left: 2 5b Motor Arm - Right: 1 6a Motor Leg - Left: 3 6b Motor Leg - Right: 3 7 Limb Ataxia: 0 8 Sensory: 0 9 Best Language: 3 10 Dysarthria: 2 11 Extinct. and Inatten.: 0 TOTAL: 16   Labs I have reviewed labs in epic and the results pertinent to this consultation are: CBC    Component Value Date/Time   WBC 11.3 (H) 08/29/2021 1153   RBC 4.43 08/29/2021 1153   HGB 11.5 (L) 08/29/2021 1153   HCT 38.7 08/29/2021 1153   PLT 327 08/29/2021 1153   MCV 87.4 08/29/2021 1153   MCH 26.0 08/29/2021 1153   MCHC 29.7 (L) 08/29/2021 1153   RDW 15.7 (H) 08/29/2021 1153   LYMPHSABS 0.7 08/29/2021 1153   MONOABS 0.2 08/29/2021 1153   EOSABS 0.0 08/29/2021 1153   BASOSABS 0.0 08/29/2021 1153    CMP     Component Value Date/Time   NA 142 08/29/2021 1200   K 3.4 (L) 08/29/2021 1200   CL 106 08/29/2021 1200   CO2 24 08/29/2021 1200   GLUCOSE 133 (H) 08/29/2021 1200   BUN 18 08/29/2021 1200   CREATININE 1.14 (H) 08/29/2021 1200   CALCIUM 7.8 (L) 08/29/2021 1200   PROT 7.3 08/29/2021 1200   ALBUMIN 3.0 (L) 08/29/2021 1200   AST 11 (L) 08/29/2021 1200   ALT 10 08/29/2021 1200   ALKPHOS 65 08/29/2021 1200   BILITOT 0.9 08/29/2021 1200   GFRNONAA 51 (L) 08/29/2021 1200   GFRAA 50 (L) 07/25/2018 0510    Imaging I have reviewed the images obtained: CT head with right hippocampal bleed.  No IVH.  No subarachnoid extension.  Chronic small vessel disease.  Assessment:  71 year old with history of PE on Eliquis presenting for evaluation of altered mental status of few days-unclear last known well, CT head noted to have right hippocampal ICH, no IVH. Imaging also suggestive of chronic small vessel disease. On examination, she is nonverbal although she tracks the examiner on both sides.  She also seems to have some left upper  extremity weakness which is a little worse than the right but exam is somewhat limited as above. 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  Plan:  Acuity: Acute Laterality: RIGHT hippocampus  Current suspected etiology:   HTN Treatment: -Admit to NICU -ICH Score: 1 -ICH Volume: <30cc  -BP control goal SYS 130-150 - use cleviprex -PT/OT/ST  -neuromonitoring  CNS -Close neuro monitoring -EEG rule out seizures  Dysarthria Dysphagia following ICH  -NPO until cleared by speech -ST  Metabolic encephalopathy -Creatinine somewhat above baseline. -Gentle hydration  Hemiplegia and hemiparesis following nontraumatic intracerebral hemorrhage affecting left non-dominant side  -Continue PT/OT/ST  RESP No active issues Monitor clinically  CV Essential (primary) hypertension Hypertensive Emergency -Aggressive BP control, goal as  above.   GI/GU We will acute Kidney Failure on top of CKD 3 -Gentle hydration -avoid nephrotoxic agents -renal consult  HEME Iron Deficiency Anemia -Monitor -transfuse for hgb < 7  Coagulopathy secondary to anticoagulation with Eliquis for PE -Reversed with Andexxa  ENDO Type 2 diabetes Sliding scale insulin   Fluid/Electrolyte Disorders Check labs in the morning Replete as necessary  ID Possible Aspiration PNA-has mild leukocytosis. -CXR -NPO -Monitor  Urinalysis with many bacteria, negative nitrite or leukocyte esterase.   Nutrition E66.9 Obesity  -diet consult  Prophylaxis DVT: SCDs only GI: PPI Bowel: Docusate senna  Dispo: Likely SNF versus rehab-TBD  Diet: NPO until cleared by speech  Code Status: Full Code for now-no family member at bedside to verify.   THE FOLLOWING WERE PRESENT ON ADMISSION: Intracerebral hemorrhage, hypertensive emergency, CKD 3, coagulopathy due to  chronic anticoagulant use for pulmonary embolism   -- Amie Portland, MD Neurologist [facility] Pager: [phone]  CRITICAL CARE ATTESTATION Performed by: Amie Portland, MD Total critical care time: 36 minutes Critical care time was exclusive of separately billable procedures and treating other patients and/or supervising APPs/Residents/Students Critical care was necessary to treat or prevent imminent or life-threatening deterioration due to [place] This patient is critically ill and at significant risk for neurological worsening and/or death and care requires constant monitoring. Critical care was time spent personally by me on the following activities: development of treatment plan with patient and/or surrogate as well as nursing, discussions with consultants, evaluation of patient's response to treatment, examination of patient, obtaining history from patient or surrogate, ordering and performing treatments and interventions, ordering and review of laboratory studies, ordering and review of radiographic studies, pulse oximetry, re-evaluation of patient's condition, participation in multidisciplinary rounds and medical decision making of high complexity in the care of this patient.

## 2021-08-29 NOTE — ED Notes (Signed)
Attempted to call report to [facility] but got a Advertising account executive.

## 2021-08-29 NOTE — ED Notes (Signed)
Pt refusing oral temp recheck. Pt closing mouth real tight so oral temp could not be obtained. Son attempted redirection without success.

## 2021-08-29 NOTE — ED Provider Notes (Signed)
[facility] Provider Note   CSN: [id number] Arrival date & time: 08/29/21  1128     History  Chief Complaint  Patient presents with   Altered Mental Status    Jerene L Tamburri is a 71 y.o. female.  Pt is a 71 yo female with a pmhx significant for htn, DM2, cva, PE (on Eliquis), cellulitis, and panniculitis.  EMS was called to bring pt to the ED by her SNF.  Pt has had AMS, fever, and tachycardia.  Pt's son said she has not been eating much for the last few days.  Pt is not giving any hx.       Home Medications Prior to Admission medications   Medication Sig Start Date End Date Taking? Authorizing Provider  acetaminophen (TYLENOL) 650 MG CR tablet Take 1,300 mg by mouth every 8 (eight) hours as needed for pain.   Yes [provider]  ammonium lactate (LAC-HYDRIN) 12 % lotion Apply topically 2 (two) times daily. 04/13/15  Yes Sinda Du, MD  aspirin 81 MG chewable tablet Chew 1 tablet (81 mg total) by mouth daily. 04/13/15  Yes Sinda Du, MD  atorvastatin (LIPITOR) 40 MG tablet Take 40 mg by mouth daily. 07/16/20  Yes [provider]  cholecalciferol (VITAMIN D3) 25 MCG (1000 UNIT) tablet Take 1,000 Units by mouth daily.   Yes [provider]  ELIQUIS 5 MG TABS tablet Take 5 mg by mouth 2 (two) times daily. 10/01/20  Yes [provider]  ferrous sulfate 325 (65 FE) MG tablet Take 325 mg by mouth daily with breakfast.   Yes [provider]  fluticasone (FLONASE) 50 MCG/ACT nasal spray Place 1 spray into both nostrils daily.   Yes [provider]  gabapentin (NEURONTIN) 100 MG capsule Take 100 mg by mouth 3 (three) times daily. 07/19/18  Yes [provider]  lisinopril (ZESTRIL) 30 MG tablet Take 15 mg by mouth daily. 06/26/20  Yes [provider]  metFORMIN (GLUCOPHAGE) 1000 MG tablet Take 500 mg by mouth 2 (two) times a day. 06/30/18  Yes [provider]  oxyCODONE-acetaminophen  (PERCOCET/ROXICET) 5-325 MG tablet Take 1 tablet by mouth 2 (two) times daily as needed. 10/27/20  Yes [provider]  verapamil (CALAN-SR) 180 MG CR tablet Take 180 mg by mouth daily. 07/01/18  Yes [provider]  vitamin C (ASCORBIC ACID) 500 MG tablet Take 500 mg by mouth daily.   Yes [provider]  feeding supplement, GLUCERNA SHAKE, (GLUCERNA SHAKE) LIQD Take 237 mLs by mouth 3 (three) times daily between meals. Patient not taking: Reported on 08/29/2021 08/24/20   Heath Lark D, DO  mirtazapine (REMERON) 7.5 MG tablet Take 1 tablet (7.5 mg total) by mouth at bedtime. 08/24/20 09/23/20  Manuella Ghazi, Pratik D, DO  senna (SENOKOT) 8.6 MG TABS tablet Take 1 tablet (8.6 mg total) by mouth daily as needed for mild constipation. Patient not taking: Reported on 08/29/2021 04/13/15   Sinda Du, MD      Allergies    Vancomycin    Review of Systems   Review of Systems  Unable to perform ROS: Mental status change  All other systems reviewed and are negative.   Physical Exam Updated Vital Signs BP (!) 157/66   Pulse 91   Temp 99.9 F (37.7 C) (Axillary)   Resp 15   Ht '5\' 6"'$  (1.676 m)   Wt 101 kg   SpO2 100%   BMI 35.94 kg/m  Physical Exam Vitals  and nursing note reviewed.  Constitutional:      Appearance: She is obese. She is ill-appearing.  HENT:     Head: Normocephalic and atraumatic.     Right Ear: External ear normal.     Left Ear: External ear normal.     Nose: Nose normal.     Mouth/Throat:     Mouth: Mucous membranes are dry.  Eyes:     Extraocular Movements: Extraocular movements intact.     Conjunctiva/sclera: Conjunctivae normal.     Pupils: Pupils are equal, round, and reactive to light.  Cardiovascular:     Rate and Rhythm: Regular rhythm. Tachycardia present.     Pulses: Normal pulses.     Heart sounds: Normal heart sounds.  Pulmonary:     Effort: Pulmonary effort is normal.     Breath sounds: Normal breath sounds.  Abdominal:      General: Abdomen is flat. Bowel sounds are normal.     Palpations: Abdomen is soft.  Musculoskeletal:        General: Normal range of motion.     Cervical back: Normal range of motion and neck supple.  Skin:    General: Skin is warm.     Capillary Refill: Capillary refill takes less than 2 seconds.  Neurological:     Mental Status: She is alert.     Comments: Pt following a few simple commands.  She is not talking (normally, she does talk).  She is awake and will look at the examiner.  Psychiatric:     Comments: Unable to assess     ED Results / Procedures / Treatments   Labs (all labs ordered are listed, but only abnormal results are displayed) Labs Reviewed  CBC WITH DIFFERENTIAL/PLATELET - Abnormal; Notable for the following components:      Result Value   WBC 11.3 (*)    Hemoglobin 11.5 (*)    MCHC 29.7 (*)    RDW 15.7 (*)    Neutro Abs 10.3 (*)    All other components within normal limits  PROTIME-INR - Abnormal; Notable for the following components:   Prothrombin Time 16.8 (*)    INR 1.4 (*)    All other components within normal limits  URINALYSIS, ROUTINE W REFLEX MICROSCOPIC - Abnormal; Notable for the following components:   APPearance CLOUDY (*)    Specific Gravity, Urine >1.030 (*)    Hgb urine dipstick TRACE (*)    Bilirubin Urine SMALL (*)    Protein, ur 100 (*)    All other components within normal limits  COMPREHENSIVE METABOLIC PANEL - Abnormal; Notable for the following components:   Potassium 3.4 (*)    Glucose, Bld 133 (*)    Creatinine, Ser 1.14 (*)    Calcium 7.8 (*)    Albumin 3.0 (*)    AST 11 (*)    GFR, Estimated 51 (*)    All other components within normal limits  URINALYSIS, MICROSCOPIC (REFLEX) - Abnormal; Notable for the following components:   Bacteria, UA MANY (*)    All other components within normal limits  CULTURE, BLOOD (ROUTINE X 2)  CULTURE, BLOOD (ROUTINE X 2)  URINE CULTURE  LACTIC ACID, PLASMA  LACTIC ACID, PLASMA  APTT     EKG EKG Interpretation  Date/Time:  Monday August 29 2021 11:46:13 EDT Ventricular Rate:  107 PR Interval:  139 QRS Duration: 96 QT Interval:  337 QTC Calculation: 450 R Axis:   -23 Text Interpretation: Sinus tachycardia Left ventricular  hypertrophy Inferior infarct, old Since last tracing rate faster Confirmed by Isla Pence [phone]) on 08/29/2021 2:11:58 PM  Radiology CT Angio Chest PE W and/or Wo Contrast  Result Date: 08/29/2021 CLINICAL DATA:  Pulmonary embolism suspected, high probability. Mental status changes. EXAM: CT ANGIOGRAPHY CHEST WITH CONTRAST TECHNIQUE: Multidetector CT imaging of the chest was performed using the standard protocol during bolus administration of intravenous contrast. Multiplanar CT image reconstructions and MIPs were obtained to evaluate the vascular anatomy. RADIATION DOSE REDUCTION: This exam was performed according to the departmental dose-optimization program which includes automated exposure control, adjustment of the mA and/or kV according to patient size and/or use of iterative reconstruction technique. CONTRAST:  2m OMNIPAQUE IOHEXOL 350 MG/ML SOLN COMPARISON:  Chest radiography same day FINDINGS: Cardiovascular: Pulmonary arterial opacification is good. No pulmonary emboli. Cardiomegaly. Coronary artery calcification. Aortic atherosclerotic calcification without aneurysm or dissection. Mediastinum/Nodes: No mediastinal or hilar mass or lymphadenopathy. Lungs/Pleura: Mild dependent atelectasis. Very tiny effusions. No pneumonia or mass. Upper Abdomen: Negative Musculoskeletal: No acute musculoskeletal finding. Review of the MIP images confirms the above findings. IMPRESSION: No pulmonary emboli. Minimal dependent pulmonary atelectasis, otherwise no active chest disease. Coronary artery calcification. Aortic atherosclerotic calcification. Electronically Signed   By: MNelson ChimesM.D.   On: 08/29/2021 15:28   CT Head Wo Contrast  Result Date:  08/29/2021 CLINICAL DATA:  Mental status change of unknown cause EXAM: CT HEAD WITHOUT CONTRAST TECHNIQUE: Contiguous axial images were obtained from the base of the skull through the vertex without intravenous contrast. RADIATION DOSE REDUCTION: This exam was performed according to the departmental dose-optimization program which includes automated exposure control, adjustment of the mA and/or kV according to patient size and/or use of iterative reconstruction technique. COMPARISON:  08/24/2020 FINDINGS: Brain: Hemorrhage within the hippocampus on the right measuring 4.3 x 2.1 x 1.6 cm (volume = 7.6 cm^3). This is most consistent with hemorrhagic infarction. No intraventricular penetration. No subarachnoid penetration. Elsewhere, the brain shows atrophy and chronic small-vessel ischemic change as seen previously. No hydrocephalus or extra-axial collection. Vascular: There is atherosclerotic calcification of the major vessels at the base of the brain. 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 No intraventricular or subarachnoid penetration. Extensive chronic small-vessel ischemic changes seen elsewhere throughout the brain. Critical Value/emergent results were called by telephone at the time of interpretation on 08/29/2021 at 3:26 pm to provider Ifeanyi Mickelson , who verbally acknowledged these results. Electronically Signed   By: MNelson ChimesM.D.   On: 08/29/2021 15:26   DG Chest Port 1 View  Result Date: 08/29/2021 CLINICAL DATA:  71year old female with questionable sepsis EXAM: PORTABLE CHEST 1 VIEW COMPARISON:  08/20/2020 FINDINGS: Cardiomediastinal silhouette unchanged in size and contour. No evidence of central vascular congestion. No interlobular septal thickening. Persisting low lung volumes No pneumothorax or pleural effusion. Coarsened interstitial markings, with no  confluent airspace disease. No acute displaced fracture. IMPRESSION: Low lung volumes with no definite acute cardiopulmonary disease Electronically Signed   By: JCorrie MckusickD.O.   On: 08/29/2021 12:12    Procedures Procedures    Medications Ordered in ED Medications  coag fact Xa recombinant (ANDEXXA) high dose infusion 1800 mg (has no administration in time range)  lactated ringers bolus 1,000 mL (0 mLs Intravenous Stopped 08/29/21 1334)  cefTRIAXone (ROCEPHIN) 2 g in sodium chloride 0.9 % 100 mL IVPB (0 g Intravenous Stopped 08/29/21 1330)  acetaminophen (TYLENOL) tablet 1,000 mg (  1,000 mg Oral Given 08/29/21 1212)  iohexol (OMNIPAQUE) 350 MG/ML injection 100 mL (80 mLs Intravenous Contrast Given 08/29/21 1515)    ED Course/ Medical Decision Making/ A&P                           Medical Decision Making Amount and/or Complexity of Data Reviewed Labs: ordered. Radiology: ordered. ECG/medicine tests: ordered.  Risk OTC drugs. Prescription drug management. Decision regarding hospitalization.   This patient presents to the ED for concern of ams, this involves an extensive number of treatment options, and is a complaint that carries with it a high risk of complications and morbidity.  The differential diagnosis includes sepsis, infection, electrolyte abn   Co morbidities that complicate the patient evaluation  htn, DM2, cva, PE, cellulitis, and panniculitis   Additional history obtained:  Additional history obtained from epic chart review External records from outside source obtained and reviewed including EMS report, son   Lab Tests:  I Ordered, and personally interpreted labs.  The pertinent results include:  cbc with wbc 11.3 and hgb 11.5; inr 1.4; lactic 1.2; cmp with k 3.4 and cr 1.14   Imaging Studies ordered:  I ordered imaging studies including CXR  I independently visualized and interpreted imaging which showed  IMPRESSION:  Low lung volumes with no definite  acute cardiopulmonary disease  CT head: IMPRESSION:  Region of hemorrhage within the hippocampus on the right, probably  representing a hemorrhagic infarction. No intraventricular or  subarachnoid penetration. Extensive chronic small-vessel ischemic  changes seen elsewhere throughout the brain.  CT chest: IMPRESSION:  No pulmonary emboli. Minimal dependent pulmonary atelectasis,  otherwise no active chest disease.    Coronary artery calcification. Aortic atherosclerotic calcification.   I agree with the radiologist interpretation   Cardiac Monitoring:  The patient was maintained on a cardiac monitor.  I personally viewed and interpreted the cardiac monitored which showed an underlying rhythm of: sinus tachy   Medicines ordered and prescription drug management:  I ordered medication including rocephin  for possible uti  Reevaluation of the patient after these medicines showed that the patient stayed the same I have reviewed the patients home medicines and have made adjustments as needed   Test Considered:  Ct head/chest   Critical Interventions:  Abx, andexxa   Consultations Obtained:  I requested consultation with the neurologist (Dr. Cheral Marker),  and discussed lab and imaging findings as well as pertinent plan - He recommended transfer to [facility] and admission to Dr. Rory Percy to the NICU.  He also requested we given pt Andexxa.    Problem List / ED Course:  ICH:  pt on Eliquis 5 mg (last dose 0900 today).  Pt started on Andexxa. Fever:  Due to UTI.  Pt given rocephin   Reevaluation:  After the interventions noted above, I reevaluated the patient and found that they have :stayed the same   Social Determinants of Health:  Lives in [place]   Dispostion:  After consideration of the diagnostic results and the patients response to treatment, I feel that the patent would benefit from admission.    CRITICAL CARE Performed by: Isla Pence   Total critical care time:  45 minutes  Critical care time was exclusive of separately billable procedures and treating other patients.  Critical care was necessary to treat or prevent imminent or life-threatening deterioration.  Critical care was time spent personally by me on the following activities: development of treatment plan with patient  and/or surrogate as well as nursing, discussions with consultants, evaluation of patient's response to treatment, examination of patient, obtaining history from patient or surrogate, ordering and performing treatments and interventions, ordering and review of laboratory studies, ordering and review of radiographic studies, pulse oximetry and re-evaluation of patient's condition.        Final Clinical Impression(s) / ED Diagnoses Final diagnoses:  Hemorrhagic cerebrovascular accident (CVA) ([place])  On apixaban therapy  Acute cystitis without hematuria    Rx / DC Orders ED Discharge Orders     None         Isla Pence, MD 08/29/21 1624

## 2021-08-29 NOTE — ED Notes (Signed)
Pharmacy notified of asap need for andexxa.

## 2021-08-29 NOTE — ED Notes (Signed)
MSE not signed. Pt confused.

## 2021-08-29 NOTE — ED Notes (Signed)
It is hard for pt to swallow whole pills.

## 2021-08-29 NOTE — Progress Notes (Signed)
EEG complete - results pending 

## 2021-08-29 NOTE — ED Triage Notes (Signed)
Pt arrived [facility] from [facility] home for c/o AMS, Fever and tachy. Pt is on RA.

## 2021-08-30 ENCOUNTER — Inpatient Hospital Stay (HOSPITAL_COMMUNITY): Payer: Medicare (Managed Care)

## 2021-08-30 DIAGNOSIS — Z86718 Personal history of other venous thrombosis and embolism: Secondary | ICD-10-CM

## 2021-08-30 DIAGNOSIS — I161 Hypertensive emergency: Secondary | ICD-10-CM

## 2021-08-30 DIAGNOSIS — R569 Unspecified convulsions: Secondary | ICD-10-CM

## 2021-08-30 DIAGNOSIS — I619 Nontraumatic intracerebral hemorrhage, unspecified: Secondary | ICD-10-CM | POA: Diagnosis not present

## 2021-08-30 DIAGNOSIS — I6389 Other cerebral infarction: Secondary | ICD-10-CM | POA: Diagnosis not present

## 2021-08-30 DIAGNOSIS — I610 Nontraumatic intracerebral hemorrhage in hemisphere, subcortical: Secondary | ICD-10-CM

## 2021-08-30 DIAGNOSIS — D72829 Elevated white blood cell count, unspecified: Secondary | ICD-10-CM

## 2021-08-30 DIAGNOSIS — R509 Fever, unspecified: Secondary | ICD-10-CM

## 2021-08-30 LAB — GLUCOSE, CAPILLARY
Glucose-Capillary: 155 mg/dL — ABNORMAL HIGH (ref 70–99)
Glucose-Capillary: 149 mg/dL — ABNORMAL HIGH (ref 70–99)
Glucose-Capillary: 133 mg/dL — ABNORMAL HIGH (ref 70–99)
Glucose-Capillary: 130 mg/dL — ABNORMAL HIGH (ref 70–99)

## 2021-08-30 LAB — ECHOCARDIOGRAM COMPLETE
AR max vel: 2.05 cm2
AV Area VTI: 2.03 cm2
AV Area mean vel: 2.19 cm2
AV Mean grad: 11.4 mmHg
AV Peak grad: 24.3 mmHg
Ao pk vel: 2.46 m/s
Area-P 1/2: 4.8 cm2
Height: 66 in
S' Lateral: 2.5 cm
Weight: 3563.2 oz

## 2021-08-30 LAB — URINE CULTURE

## 2021-08-30 MED ORDER — HEPARIN SODIUM (PORCINE) 5000 UNIT/ML IJ SOLN
5000.0000 [IU] | Freq: Three times a day (TID) | INTRAMUSCULAR | Status: DC
  Administered 2021-08-30 – 2021-09-06 (×22): 5000 [IU] via SUBCUTANEOUS
  Filled 2021-08-30 (×22): qty 1

## 2021-08-30 MED ORDER — ORAL CARE MOUTH RINSE: 15.0000 mL | OROMUCOSAL | Status: DC | PRN

## 2021-08-30 NOTE — Progress Notes (Signed)
PT Cancellation Note  Patient Details Name: [name] MRN: [id number] DOB: 30-Jul-1950   Cancelled Treatment:    Reason Eval/Treat Not Completed: Active bedrest order   Sandy Salaam Lamark Schue 08/30/2021, 7:04 AM [place] Office: [phone]

## 2021-08-30 NOTE — Procedures (Addendum)
Patient Name: [name]  MRN: [id number]  Epilepsy Attending: Lora Havens  Referring Physician/Provider: Amie Portland, MD  Date: 08/29/2021 Duration: 23.09 mins  Patient history: 71 year old with history of PE on Eliquis presenting for evaluation of altered mental status of few days. EEG to evaluate for seizure  Level of alertness: lethargic  AEDs during EEG study: None  Technical aspects: This EEG study was done with scalp electrodes positioned according to the 10-20 International system of electrode placement. Electrical activity was acquired at a sampling rate of '500Hz'$  and reviewed with a high frequency filter of '70Hz'$  and a low frequency filter of '1Hz'$ . EEG data were recorded continuously and digitally stored.   Description: No clear posterior dominant rhythm was seen. EEG showed continuous 4 to 5 Hz theta slowing in left hemisphere as well as low amplitude 2 to 3 Hz delta slowing in right hemisphere.  Hyperventilation and photic stimulation were not performed.     ABNORMALITY -Continuous slow, generalized and lateralized right hemisphere  IMPRESSION: This study is suggestive of cortical dysfunction in right hemisphere likely secondary to underlying structural abnormality/stroke.  Additionally there is moderate diffuse encephalopathy, nonspecific etiology.  No seizures or  epileptiform discharges were seen during the study.  River Mckercher Barbra Sarks

## 2021-08-30 NOTE — Evaluation (Signed)
Speech Language Pathology Evaluation Patient Details Name: [name] MRN: [id number] DOB: [birth year] Today's Date: 08/30/2021 Time: 1000-1030 SLP Time Calculation (min) (ACUTE ONLY): 30 min  Problem List:  Patient Active Problem List   Diagnosis Date Noted   Hemorrhagic stroke ([place]) 08/29/2021   ICH (intracerebral hemorrhage) ([place]) 08/29/2021   Pressure injury of skin 08/26/2020   Encephalopathy acute 08/24/2020   SIRS (systemic inflammatory response syndrome) ([place]) 08/21/2020   UTI (urinary tract infection) 08/21/2020   Hyperkalemia 08/21/2020   Hypoalbuminemia due to protein-calorie malnutrition ([place]) 15/40/0867   Acute metabolic encephalopathy 61/95/0932   CVA (cerebral vascular accident) ([place]) 07/24/2018   Depression 04/12/2015   TIA (transient ischemic attack) 04/09/2015   Aphasia 04/09/2015   Cellulitis and abscess of leg 04/09/2015   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   Fever, unspecified 12/25/2014   Sepsis ([place]) 12/25/2014   Cellulitis 12/25/2014   Essential hypertension, benign 12/20/2011   History of pulmonary embolus (PE) 12/20/2011   Morbid obesity ([place]) 12/20/2011   Preoperative cardiovascular examination 12/20/2011   Type 2 diabetes mellitus ([place]) 09/11/2006   Past Medical History:  Past Medical History:  Diagnosis Date   Cellulitis lower extremities   4 day hospitalization 12/25/14-12/29/14   Essential hypertension, benign    History of MRSA infection    History of panniculitis    Pulmonary emboli ([place]) 2009   Skin ulcer of thigh ([place])    Type 2 diabetes mellitus ([place])    Past Surgical History:  Past Surgical History:  Procedure Laterality Date   PARTIAL HYSTERECTOMY     Right thigh ulcer debridement  2008   Two occasions - wound VAC   HPI:  [name] is a 71 y.o. female with an unclear last known well-possibly few days because she has not been eating much, brought for evaluation at [facility] and a CT head done revealed a right  hippocampal hemorrhage with no IVH.   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    Outpatient follow-up with Dr. April Manson in September 2022 for the episodes of slurred speech/aphasia x2 reports patient chronically wheelchair-bound with barely antigravity in lower extremity strength and 4/5 bilateral upper extremity strength, poor recall but at baseline awake and oriented to person. Pt has been evalauted by SLP at bedside in 2022, no signs of aspiration.   Assessment / Plan / Recommendation Clinical Impression  Pt demonstrates significant cognitive impiarment including right sided neglect poorly focused attention, requiring repetition and visual cues for occasional responses to simple questions. Pt does not follow one step commands. Pt needed tactile and hand over hand assist to initaite basic functional tasks with left hand. Right hand tremulous with any activity. Pt will need f/u for basic cognitive function. Recommend SNF.    SLP Assessment  SLP Recommendation/Assessment: Patient needs continued Speech [place] [facility] SLP Visit Diagnosis: Cognitive communication deficit (R41.841)    Recommendations for follow up therapy are one component of a multi-disciplinary discharge planning process, led by the attending physician.  Recommendations may be updated based on patient status, additional functional criteria and insurance authorization.    Follow Up Recommendations  Skilled nursing-short term rehab (<3 hours/day)    Assistance Recommended at Discharge  Frequent or constant Supervision/Assistance  Functional Status Assessment Patient has had a recent decline in their functional  status and demonstrates the ability to make significant improvements in function in a  reasonable and predictable amount of time.  Frequency and Duration min 2x/week  2 weeks      SLP Evaluation Cognition  Overall Cognitive Status: Impaired/Different from baseline Arousal/Alertness: Awake/alert Orientation Level: Oriented to person;Disoriented to place;Disoriented to time;Disoriented to situation Attention: Focused Focused Attention: Impaired       Comprehension  Auditory Comprehension Overall Auditory Comprehension: Impaired Yes/No Questions: Impaired Basic Biographical Questions: 76-100% accurate Commands: Impaired One Step Basic Commands: 50-74% accurate    Expression Verbal Expression Overall Verbal Expression: Impaired Initiation: Impaired   Oral / Motor               [name], [name] 08/30/2021, 1:29 PM

## 2021-08-30 NOTE — Progress Notes (Signed)
OT Cancellation Note  Patient Details Name: [name] MRN: [id number] DOB: [birth year]   Cancelled Treatment:    Reason Eval/Treat Not Completed: Active bedrest order (will assess when activity orders updated)  Mikele Sifuentes,H[place] 08/30/2021, 7:53 AM Maurie Boettcher, OT/L   Acute OT Clinical Specialist Acute Rehabilitation Services Pager [phone] Office [phone]

## 2021-08-30 NOTE — Progress Notes (Addendum)
STROKE TEAM PROGRESS NOTE   INTERVAL HISTORY Her family, brother in law is at the bedside.  On Cleviprex drip '@3mg'$ . RN at bedside. She is able to state her name, age as "67", able to state place. Able to identify her brother in law with correct name.Not able to name objects. Able to repeat 3 word sentences. She has a right gaze does not cross midline, no blink to threat on left. RN states she can intermittently cross midline. She is able to follow simple commands. Has tremors on right hand. Left arm antigravity. Left side neglect. Withdraws to painful stimuli. HR 110's. Was febrile overnight. UA negative, urine culture and blood cultures pending. EEG results with no seizures identified  Vitals:   08/30/21 0645 08/30/21 0700 08/30/21 0715 08/30/21 0749  BP: (!) 139/48 (!) 146/46 133/65   Pulse: (!) 109 (!) 108 (!) 105   Resp: '17 18 18   '$ Temp:    98.9 F (37.2 C)  TempSrc:    Axillary  SpO2: 100% 100% 100%   Weight:      Height:       CBC:  Recent Labs  Lab 08/29/21 1153  WBC 11.3*  NEUTROABS 10.3*  HGB 11.5*  HCT 38.7  MCV 87.4  PLT 829   Basic Metabolic Panel:  Recent Labs  Lab 08/29/21 1200  NA 142  K 3.4*  CL 106  CO2 24  GLUCOSE 133*  BUN 18  CREATININE 1.14*  CALCIUM 7.8*   Lipid Panel:  Recent Labs  Lab 08/29/21 1912  CHOL 112  TRIG 69  HDL 58  CHOLHDL 1.9  VLDL 14  LDLCALC 40   HgbA1c:  Recent Labs  Lab 08/29/21 1912  HGBA1C 5.6   Urine Drug Screen: No results for input(s): "LABOPIA", "COCAINSCRNUR", "LABBENZ", "AMPHETMU", "THCU", "LABBARB" in the last 168 hours.  Alcohol Level No results for input(s): "ETH" in the last 168 hours.  IMAGING past 24 hours CT HEAD WO CONTRAST (5MM)  Result Date: 08/30/2021 CLINICAL DATA:  71 year old female with altered mental status and acute hemorrhage of the right hippocampus yesterday. EXAM: mental status and acute hemorrhage of the right hippocampus yesterday. EXAM: 71 year old female with altered mental status and acute hemorrhage of the right hippocampus yesterday. EXAM: mental status and acute hemorrhage of the right hippocampus yesterday. EXAM: CT HEAD WITHOUT CONTRAST TECHNIQUE: Contiguous axial images were obtained from the base of the skull through the vertex  without intravenous contrast. RADIATION DOSE REDUCTION: This exam was performed according to the departmental dose-optimization program which includes automated exposure control, adjustment of the mA and/or kV according to patient size and/or use of iterative reconstruction technique. COMPARISON:  Head CT 08/29/2021 and earlier. FINDINGS: Brain: Intra-axial hyperdense hemorrhage which mostly conforms to the right hippocampus encompasses 42 by 23 x 13 mm (AP by transverse by CC) for an estimated blood volume of 6 mL (stable). Only minor regional edema and mass effect. Questionable trace intraventricular extension to the right occipital horn is stable. No ventriculomegaly. No other extra-axial extension identified. Extensive underlying chronic small vessel disease in the bilateral cerebral white matter, left deep gray nuclei. No cortically based acute infarct identified. Vascular: Extensive Calcified atherosclerosis at the skull base. No suspicious intracranial vascular hyperdensity. Skull: Stable and intact. Sinuses/Orbits: Visualized paranasal sinuses and mastoids are clear. Other: Calcified scalp vessel atherosclerosis. No acute orbit or scalp soft tissue finding. IMPRESSION: 1. Stable hemorrhage of the right hippocampus, 6-7 mL. Minor regional edema and no significant mass effect. Questionable trace intraventricular extension of blood is stable with no ventriculomegaly. 2. No new intracranial abnormality. Underlying advanced small vessel disease, severe calcified atherosclerosis. Electronically Signed   By: Genevie Ann M.D.   On: 08/30/2021 07:11  MR BRAIN W WO CONTRAST  Result Date: 08/30/2021 CLINICAL DATA:  Follow-up examination for hemorrhagic stroke. EXAM: MRI HEAD WITHOUT AND WITH CONTRAST TECHNIQUE: Multiplanar, multiecho pulse sequences of the brain and surrounding structures were obtained without and with intravenous contrast. CONTRAST:  32m GADAVIST GADOBUTROL 1 MMOL/ML IV SOLN COMPARISON:  Prior CT  from earlier the same day as well as previous brain MRI from 07/20/2020. FINDINGS: Brain: Examination degraded by motion artifact, limiting assessment. Generalized age-related cerebral atrophy. Patchy and confluent T2/FLAIR hyperintensity involving the periventricular and deep white matter both cerebral hemispheres as well as the pons, most consistent with chronic small vessel ischemic disease, moderately advanced in nature. Remote lacunar infarcts noted at the left corona radiata, bilateral basal ganglia, and thalami. Previously identified intraparenchymal hemorrhage centered at the posterior right hippocampal formation again seen, grossly similar in size and morphology as compared to previous exam. Mild surrounding edema with partial effacement of the adjacent right lateral ventricle. No visible underlying lesion or abnormal enhancement seen on this motion degraded exam. Trace layering blood noted within the occipital horn of the left lateral ventricle, which could be related to trace intraventricular extension and/or subarachnoid penetration with redistribution. No other visible acute intracranial hemorrhage. Multiple additional scattered chronic micro hemorrhages noted about the cerebellum, brainstem, and thalami, likely related to chronic poorly controlled hypertension. No mass lesion or significant midline shift. No hydrocephalus or extra-axial fluid collection. Pituitary gland and suprasellar region within normal limits. No abnormal enhancement. Vascular: Major intracranial vascular flow voids are grossly maintained at the skull base. Skull and upper cervical spine: Craniocervical junction normal limits. Bone marrow signal intensity within normal limits. No scalp soft tissue abnormality. Sinuses/Orbits: Globes and orbital soft tissues demonstrate no acute finding. Paranasal sinuses are largely clear. Trace left mastoid effusion, of doubtful significance. Other: None. IMPRESSION: 1. Motion degraded exam. 2. No  significant interval change in intraparenchymal hemorrhage centered at the posterior right hippocampal formation. No visible underlying lesion or abnormal enhancement on this motion degraded exam. 3. Trace layering blood within the occipital horn of the left lateral ventricle, which could be related to trace intraventricular extension and/or subarachnoid penetration with redistribution. No hydrocephalus. 4. Multiple additional scattered chronic micro hemorrhages clustered about the cerebellum, brainstem, and thalami, most characteristic of chronic poorly controlled hypertension. 5. Underlying atrophy with moderate chronic small vessel ischemic disease, with a few additional remote lacunar infarcts as above. Electronically Signed   By: BJeannine BogaM.D.   On: 08/30/2021 00:46   CT Angio Chest PE W and/or Wo Contrast  Result Date: 08/29/2021 CLINICAL DATA:  Pulmonary embolism suspected, high probability. Mental status changes. EXAM: CT ANGIOGRAPHY CHEST WITH CONTRAST TECHNIQUE: Multidetector CT imaging of the chest was performed using the standard protocol during bolus administration of intravenous contrast. Multiplanar CT image reconstructions and MIPs were obtained to evaluate the vascular anatomy. RADIATION DOSE REDUCTION: This exam was performed according to the departmental dose-optimization program which includes automated exposure control, adjustment of the mA and/or kV according to patient size and/or use of iterative reconstruction technique. CONTRAST:  816mOMNIPAQUE IOHEXOL 350 MG/ML SOLN COMPARISON:  Chest radiography same day FINDINGS: Cardiovascular: Pulmonary arterial opacification is good. No pulmonary emboli. Cardiomegaly. Coronary artery calcification. Aortic atherosclerotic calcification without aneurysm or dissection. Mediastinum/Nodes: No mediastinal or hilar mass or lymphadenopathy. Lungs/Pleura: Mild dependent atelectasis. Very tiny effusions. No pneumonia or mass. Upper Abdomen:  Negative Musculoskeletal: No acute musculoskeletal finding. Review of the MIP images confirms the above findings. IMPRESSION: No pulmonary emboli. Minimal  dependent pulmonary atelectasis, otherwise no active chest disease. Coronary artery calcification. Aortic atherosclerotic calcification. Electronically Signed   By: Nelson Chimes M.D.   On: 08/29/2021 15:28   CT Head Wo Contrast  Result Date: 08/29/2021 CLINICAL DATA:  Mental status change of unknown cause EXAM: CT HEAD WITHOUT CONTRAST TECHNIQUE: Contiguous axial images were obtained from the base of the skull through the vertex without intravenous contrast. RADIATION DOSE REDUCTION: This exam was performed according to the departmental dose-optimization program which includes automated exposure control, adjustment of the mA and/or kV according to patient size and/or use of iterative reconstruction technique. COMPARISON:  08/24/2020 FINDINGS: Brain: Hemorrhage within the hippocampus on the right measuring 4.3 x 2.1 x 1.6 cm (volume = 7.6 cm^3). This is most consistent with hemorrhagic infarction. No intraventricular penetration. No subarachnoid penetration. Elsewhere, the brain shows atrophy and chronic small-vessel ischemic change as seen previously. No hydrocephalus or extra-axial collection. Vascular: There is atherosclerotic calcification of the major vessels at the base of the brain. 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 No intraventricular or subarachnoid penetration. Extensive chronic small-vessel ischemic changes seen elsewhere throughout the brain. Critical Value/emergent results were called by telephone at the time of interpretation on 08/29/2021 at 3:26 pm to provider JULIE HAVILAND , who verbally acknowledged these results. Electronically Signed   By: Nelson Chimes M.D.   On: 08/29/2021 15:26   DG Chest Port 1  View  Result Date: 08/29/2021 CLINICAL DATA:  71 year old female with questionable sepsis EXAM: PORTABLE CHEST 1 VIEW COMPARISON:  08/20/2020 FINDINGS: Cardiomediastinal silhouette unchanged in size and contour. No evidence of central vascular congestion. No interlobular septal thickening. Persisting low lung volumes No pneumothorax or pleural effusion. Coarsened interstitial markings, with no confluent airspace disease. No acute displaced fracture. IMPRESSION: Low lung volumes with no definite acute cardiopulmonary disease Electronically Signed   By: Corrie Mckusick D.O.   On: 08/29/2021 12:12    PHYSICAL EXAM She is able to state her name, age as "40", able to state place. Able to identify her brother in law with correct name.Not able to name objects. Able to repeat 3 word sentences. She has a right gaze does not cross midline, no blink to threat on left. RN states she can intermittently cross midline. She is able to follow simple commands. Has tremors on right hand. Left arm antigravity. Left side neglect. Withdraws to painful stimuli.  ASSESSMENT/PLAN Ms. LILYONNA STEIDLE is a 71 y.o. 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 CT findings of the ICH, she was given Andexxa for reversal  ICH: Acute Right hippocampus ICH, likely due to hypertension in the setting of eliquis use CT head Region of hemorrhage within the hippocampus on the right, probably representing a hemorrhagic infarction.  Repeat CT head stable hemorrhage of the right hippocampus, 6-7 mL. Questionable trace intraventricular extension of blood is stable with no ventriculomegaly. MRI  No significant interval change in intraparenchymal hemorrhage centered at the posterior right hippocampal formation. Trace layering blood within the occipital horn of the left lateral ventricle.  Multiple additional scattered chronic micro hemorrhages clustered about the cerebellum, brainstem, and thalami, most characteristic of chronic poorly controlled hypertension. EEG moderate diffuse encephalopathy 2D Echo pending LDL 40 HgbA1c 5.6 VTE prophylaxis - Heparin SQ aspirin 81 mg daily and Eliquis BID  prior to admission, now on No antithrombotic.  Therapy recommendations:  pending Disposition:  pending  Hx of DVT/PE on eliquis S/p Andexxa reversal CTA chest no PE AC on hold  Hx of stroke 03/2015 TIA 07/2019 admitted for speech difficulty, dysphagia, MRI showed bilateral infarcts.  Xarelto changed to Eliquis 06/2020 admitted for right-sided weakness and word finding difficulty.  MRI negative for acute infarct, but old left frontal infarct, left BG/CR, thalamus and pontine infarcts.  MRI negative. 08/2020 admitted for decreased talking.  CT negative.  EEG showed encephalopathy.  Hypertensive emergency Home meds:  lisinopril, verapamil UnStable BP goal < 160 On cleviprex gtt @ '3mg'$   Long-term BP goal normotensive  Hyperlipidemia Home meds:  lipitor 40 LDL 40, goal < 70 Continue statin at discharge  Diabetes type II Controlled Home meds:  metformin HgbA1c 5.6, goal < 7.0 Monitor CBGs SSI Close PCP follow up as outpt  Fever Leukocytosis Tachycardia Leukocytosis WBC 11.3 Tmax 101-100.8 Tachycardia CXR negative.  UA negative. Etiology not quite clear May consider CCM consult if needed  Dysphagia Did not Hedstrom swallow Currently on p.o. Speech on board  Other Stroke Risk Factors Advanced Age >/= 1  Obesity, Body mass index is 35.94 kg/m., BMI >/= 30 associated with increased stroke risk, recommend weight loss, diet and exercise as appropriate   Other Active Problems AKI on CKD creatinine 1.14 IDA  Hospital day # 1  Beulah Gandy DNP, ACNPC-AG   ATTENDING NOTE: I reviewed above note and agree with the assessment and plan. Pt was seen and examined.    71 year old female with history of DVT/PE on Eliquis, diabetes, hypertension, history of stroke, baseline bedridden/wheelchair bound admitted for functional decline, decreased eating.  CT showed right hippocampus ICH.  CTA chest no PE.  MRI showed stable hippocampus ICH on the right with trace IVH.  Microhemorrhages consistent with hypertensive pattern.  Repeat CT also stable hematoma.  EF more than 75%.  LDL 40, A1c 5.6, UA negative, creatinine 1.14, WBC 11.3, chest x-ray negative.  Tmax 101-100.8.  Tachycardia  On exam, brother in law is at the bedside. Pt is awake, alert, eyes open, orientated to place and people, but not to time and told me her age 36 instead of 54. She is able to have words for answer orientation questions but no spontaneous sentences, able to repeat 3 word sentence but not 5 word sentence. No able to name. Right gaze preference and not cross midline. Blinking to visual threat on the right but not on the left. Left nasolabial fold flattening at rest but no facial droop on facial movement. Tongue protrusion midline after repeat prompt. RUE 3/5 no drift and LUE withdraw 2+/5 on pain stimulation. Bilaterally LEs mild withdraw to pain 2/5. Sensation, coordination and gait not tested. However, per RN, pt does have occasional spontaneous gaze to left and move left UE against gravity, therefore, could be component of left neglect.   Etiology for patient ICH likely due to hypertensive emergency in the setting of Eliquis use.  Eliquis reversed with Andexxa.  Repeat CT stable.  However patient does have fever, leukocytosis and tachycardia, so far etiology unclear, UA and CXR negative.  May consider CCM consult, questionable aspiration due to dysphagia.  Currently NPO.  Continue Cleviprex as needed for BP management.  Once passed swallow, will start p.o. BP meds.  BP goal less than 160.  Continue ICU care  For detailed assessment and plan, please refer to above/below as I have made changes  wherever appropriate.  Rosalin Hawking, MD PhD Stroke Neurology 08/30/2021 9:48 PM  This patient is critically ill due to [place], fever, leukocytosis and tachycardia, hypertensive emergency and at significant risk of neurological worsening, death form hematoma expansion, sepsis, hypertensive encephalopathy, brain herniation. This patient's care requires constant monitoring of vital signs, hemodynamics, respiratory and cardiac monitoring, review of multiple databases, neurological assessment, discussion with family, other specialists and medical decision making of high complexity. I spent 40 minutes of neurocritical care time in the care of this patient. I had long discussion with patient and brother-in-law at bedside, updated pt current condition, treatment plan and potential prognosis, and answered all the questions.  They expressed understanding and appreciation.  I also discussed CCM Dr. Lake Bells     To contact Stroke Continuity provider, please refer to [email or web address] After hours, contact General Neurology

## 2021-08-30 NOTE — Progress Notes (Signed)
  Echocardiogram 2D Echocardiogram has been performed.  Merrie Roof F 08/30/2021, 5:08 PM

## 2021-08-30 NOTE — Progress Notes (Signed)
   08/30/21 0900  SLP Visit Information  SLP Received On 08/30/21  General Information  HPI [name] is a 71 y.o. 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    Outpatient follow-up with Dr. April Manson in September 2022 for the episodes of slurred speech/aphasia x2 reports patient chronically wheelchair-bound with barely antigravity in lower extremity strength and 4/5 bilateral upper extremity strength, poor recall but at baseline awake and oriented to person. Pt has been evalauted by SLP at bedside in 2022, no signs of aspiration.  Type of Study Bedside Swallow Evaluation  Diet Prior to this Study NPO  Temperature Spikes Noted No  Respiratory Status Room air  History of Recent Intubation No  Behavior/Cognition Alert;Doesn't follow directions;Distractible;Confused  Patient Positioning Upright in bed  Baseline Vocal Quality Normal  Volitional Cough Cognitively unable to elicit  Volitional Swallow Unable to elicit  Oral Motor/Sensory Function  Overall Oral Motor/Sensory Function Other (comment) (will not follow commands)  Ice Chips  Ice chips NT  Thin Liquid  Thin Liquid Impaired  Presentation Cup  Oral Phase Functional Implications Oral holding  Nectar Thick Liquid  Nectar Thick Liquid NT  Honey Thick Liquid  Honey Thick Liquid NT  Puree  Puree Impaired  Presentation Spoon  Oral Phase Functional Implications Oral holding  Solid  Solid NT  SLP Assessment  Clinical Impression Statement (ACUTE ONLY) Pt demonstrates significant cognitive based oral  dysphagia. Pt will not follow oral motor commands, but will occasionally speak a few words and does not show overt oral motor weakness. Pt will sip from a cup edge when offered a sip and will take a bite of puree from a spoon, but only orally holds these bolsues despite all effortsfrom SLP to faciliate manipulation and transit. Water and puree removed with suction. Pt is not ready for oral intake and will likely need Cortrak unless mentation improves quickly. Recommend SNF level rehabilitation at this time.  SLP Visit Diagnosis Dysphagia, unspecified (R13.10)  Impact on safety and function Risk for inadequate nutrition/hydration  Other Related Risk Factors Cognitive impairment  Swallow Evaluation Recommendations  SLP Diet Recommendations NPO  Treatment Plan  Treatment Recommendations Therapy as outlined in treatment plan below  Follow Up Recommendations Skilled nursing-short term rehab (<3 hours/day)  Assistance recommended at discharge Frequent or constant Supervision/Assistance  Functional Status Assessment Patient has had a recent decline in their functional status and demonstrates the ability to make significant improvements in function in a reasonable and predictable amount of time.  Speech Therapy Frequency (ACUTE ONLY) min 2x/week  Treatment Duration 2 weeks  Individuals Consulted  Consulted and Agree with Results and Recommendations Patient unable/family or caregiver not available;RN  SLP Time Calculation  SLP Start Time (ACUTE ONLY) 1000  SLP Stop Time (ACUTE ONLY) 1030  SLP Time Calculation (min) (ACUTE ONLY) 30 min  SLP Evaluations  $ SLP Speech Visit 1 Visit  SLP Evaluations  $BSS Swallow 1 Procedure

## 2021-08-31 ENCOUNTER — Inpatient Hospital Stay (HOSPITAL_COMMUNITY): Payer: Medicare (Managed Care)

## 2021-08-31 DIAGNOSIS — I610 Nontraumatic intracerebral hemorrhage in hemisphere, subcortical: Secondary | ICD-10-CM | POA: Diagnosis not present

## 2021-08-31 DIAGNOSIS — I619 Nontraumatic intracerebral hemorrhage, unspecified: Secondary | ICD-10-CM | POA: Diagnosis not present

## 2021-08-31 DIAGNOSIS — Z7901 Long term (current) use of anticoagulants: Secondary | ICD-10-CM

## 2021-08-31 DIAGNOSIS — N179 Acute kidney failure, unspecified: Secondary | ICD-10-CM

## 2021-08-31 DIAGNOSIS — D72829 Elevated white blood cell count, unspecified: Secondary | ICD-10-CM | POA: Diagnosis not present

## 2021-08-31 LAB — CBC
HCT: 35.8 % — ABNORMAL LOW (ref 36.0–46.0)
Hemoglobin: 10.8 g/dL — ABNORMAL LOW (ref 12.0–15.0)
MCH: 26.2 pg (ref 26.0–34.0)
MCHC: 30.2 g/dL (ref 30.0–36.0)
MCV: 86.7 fL (ref 80.0–100.0)
Platelets: 253 10*3/uL (ref 150–400)
RBC: 4.13 MIL/uL (ref 3.87–5.11)
RDW: 15.8 % — ABNORMAL HIGH (ref 11.5–15.5)
WBC: 15.6 10*3/uL — ABNORMAL HIGH (ref 4.0–10.5)
nRBC: 0 % (ref 0.0–0.2)

## 2021-08-31 LAB — GLUCOSE, CAPILLARY
Glucose-Capillary: 72 mg/dL (ref 70–99)
Glucose-Capillary: 91 mg/dL (ref 70–99)
Glucose-Capillary: 99 mg/dL (ref 70–99)
Glucose-Capillary: 108 mg/dL — ABNORMAL HIGH (ref 70–99)
Glucose-Capillary: 127 mg/dL — ABNORMAL HIGH (ref 70–99)

## 2021-08-31 LAB — BASIC METABOLIC PANEL
Anion gap: 11 (ref 5–15)
Anion gap: 13 (ref 5–15)
BUN: 21 mg/dL (ref 8–23)
BUN: 19 mg/dL (ref 8–23)
CO2: 23 mmol/L (ref 22–32)
CO2: 22 mmol/L (ref 22–32)
Calcium: 7.5 mg/dL — ABNORMAL LOW (ref 8.9–10.3)
Calcium: 7.8 mg/dL — ABNORMAL LOW (ref 8.9–10.3)
Chloride: 111 mmol/L (ref 98–111)
Chloride: 111 mmol/L (ref 98–111)
Creatinine, Ser: 1.46 mg/dL — ABNORMAL HIGH (ref 0.44–1.00)
Creatinine, Ser: 1.53 mg/dL — ABNORMAL HIGH (ref 0.44–1.00)
GFR, Estimated: 38 mL/min — ABNORMAL LOW (ref >60)
GFR, Estimated: 36 mL/min — ABNORMAL LOW (ref >60)
Glucose, Bld: 89 mg/dL (ref 70–99)
Glucose, Bld: 113 mg/dL — ABNORMAL HIGH (ref 70–99)
Potassium: 2.6 mmol/L — CL (ref 3.5–5.1)
Potassium: 2.8 mmol/L — ABNORMAL LOW (ref 3.5–5.1)
Sodium: 145 mmol/L (ref 135–145)
Sodium: 146 mmol/L — ABNORMAL HIGH (ref 135–145)

## 2021-08-31 MED ORDER — POTASSIUM CHLORIDE 10 MEQ/100ML IV SOLN
10.0000 meq | INTRAVENOUS | Status: AC
  Administered 2021-08-31 (×6): 10 meq via INTRAVENOUS
  Filled 2021-08-31 (×6): qty 100

## 2021-08-31 MED ORDER — LABETALOL HCL 5 MG/ML IV SOLN: 5.0000 mg | INTRAVENOUS | Status: DC | PRN

## 2021-08-31 MED ORDER — POTASSIUM CHLORIDE 10 MEQ/100ML IV SOLN
10.0000 meq | INTRAVENOUS | Status: AC
  Administered 2021-08-31 – 2021-09-01 (×6): 10 meq via INTRAVENOUS
  Filled 2021-08-31 (×6): qty 100

## 2021-08-31 MED ORDER — SODIUM CHLORIDE 0.9 % IV SOLN: INTRAVENOUS | Status: DC

## 2021-08-31 NOTE — Progress Notes (Signed)
  Transition of Care (TOC) Screening Note   Patient Details  Name: [name] Date of Birth: 06-Apr-1950   Transition [facility] Care [facility] CM/SW Contact:    Benard Halsted, LCSW Phone Number: 08/31/2021, 6:01 PM    Transition of Care Department [facility]) has reviewed patient and she resides at [facility]. We will continue to monitor patient advancement through interdisciplinary progression rounds. If new patient transition needs arise, please place a TOC consult.

## 2021-08-31 NOTE — Progress Notes (Signed)
Date and time results received: 08/31/21 0309   Test: K+ Critical Value: 2.6  Name of Provider Notified: Leonel Ramsay  Orders Received? Or Actions Taken?: Actions Taken: Potassium Chloride 10 mEq/151m x 6 ordered by MD. First bag started at 0322 by this RN.   Pt neuro exam unchanged, FC and wakes easily, VS stable. Will continue with care plan.  DRoyal Piedra RN

## 2021-08-31 NOTE — Progress Notes (Signed)
Speech Language Pathology Treatment: Dysphagia;Cognitive-Linquistic  Patient Details Name: [name] MRN: [id number] DOB: 1950/07/01 Today's Date: 08/31/2021 Time: 0940-1000 SLP Time Calculation (min) (ACUTE ONLY): 20 min  Assessment / Plan / Recommendation Clinical Impression  Pt mentation significantly improved today, she is conversant, smiling at times, able to follow one step commands with additional time to process needed, or at times, multiple repetitions or rephrasing question. Pt is able to name 5/8 line drawings with circumlocutions on the other three items. When given open ended questions pt demonstrates paraphasias and effortful, nonfluent language. Pt cannot elaborate on the severity or persistence of baseline aphasia, which was documented previously. Hopeful that pt is returning to baseline.  Pt is able to self feed water via straw with no oral holding and no signs of aspiraiton. However, less than a minute after sipping water pt vomited yellow liquid. She denied nausea. Pt is pending Cortrak placement, but appears to be able to swallow if she can keep POs down. Will start a clear liquid diet so that RN can try sips and meds orally later in the day to check pts tolerance and determine need for Cortrak or otherwise.    HPI HPI: [name] is a 71 y.o. 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    Outpatient follow-up with Dr. April Manson in September 2022 for the episodes of slurred speech/aphasia x2 reports patient chronically wheelchair-bound with barely  antigravity in lower extremity strength and 4/5 bilateral upper extremity strength, poor recall but at baseline awake and oriented to person. Pt has been evalauted by SLP at bedside in 2022, no signs of aspiration.      SLP Plan  Continue with current plan of care      Recommendations for follow up therapy are one component of a multi-disciplinary discharge planning process, led by the attending physician.  Recommendations may be updated based on patient status, additional functional criteria and insurance authorization.    Recommendations  Diet recommendations: Thin liquid Medication Administration: Whole meds with liquid                Follow Up Recommendations: Skilled nursing-short term rehab (<3 hours/day) Assistance recommended at discharge: Frequent or constant Supervision/Assistance SLP Visit Diagnosis: Cognitive communication deficit (X54.008) Plan: Continue with current plan of care           Dvontae Ruan, Katherene Ponto  08/31/2021, 10:20 AM

## 2021-08-31 NOTE — Evaluation (Signed)
Occupational Therapy Evaluation Patient Details Name: ANAIZA BEHRENS MRN: [id number] DOB: 06/29/1950 Today's Date: 08/31/2021   History of Present Illness 71 yo female admitted 7/10 from [facility] with AMS, fever and Rt hippocampal hemorrhage. PMHx: PE, DM, HTN, CVA, cellulitis, obesity, anxiety   Clinical Impression   Patient admitted for the diagnosis above.  PTA it appears she resides at a local SNF, and has needed assist for ADL and toileting at bedlevel, no significant OT needs exist in the acute setting.  Currently she is needing setup and supervision to self feed - liquid diet.  OT will defer OT screen and care planning needs to next level of care.  Return to SNF recommended.        Recommendations for follow up therapy are one component of a multi-disciplinary discharge planning process, led by the attending physician.  Recommendations may be updated based on patient status, additional functional criteria and insurance authorization.   Follow Up Recommendations  Skilled nursing-short term rehab (<3 hours/day)    Assistance Recommended at Discharge Frequent or constant Supervision/Assistance  Patient can return home with the following A lot of help with bathing/dressing/bathroom    Functional Status Assessment  Patient has had a recent decline in their functional status and demonstrates the ability to make significant improvements in function in a reasonable and predictable amount of time.  Equipment Recommendations  None recommended by OT    Recommendations for Other Services       Precautions / Restrictions Precautions Precautions: Fall Restrictions Weight Bearing Restrictions: No      Mobility Bed Mobility Overal bed mobility: Needs Assistance Bed Mobility: Rolling Rolling: Max assist, +2 for physical assistance              Transfers                          Balance                                           ADL  either performed or assessed with clinical judgement   ADL Overall ADL's : At baseline                                       General ADL Comments: setup and supervision for self feeding - liquid diet.     Vision Baseline Vision/History: 1 Wears glasses Patient Visual Report: No change from baseline Additional Comments: makes good eye contact, tracks well, and crossing midline without deficts.     Perception Perception Perception: Not tested   Praxis Praxis Praxis: Not tested    Pertinent Vitals/Pain Pain Assessment Pain Assessment: Faces Faces Pain Scale: Hurts little more Pain Location: peri care Pain Descriptors / Indicators: Grimacing Pain Intervention(s): Monitored during session     Hand Dominance Right   Extremity/Trunk Assessment Upper Extremity Assessment Upper Extremity Assessment: Generalized weakness;RUE deficits/detail;LUE deficits/detail RUE Deficits / Details: decreased end range for shoulder flexion LUE Deficits / Details: decreased end range for shoulder flexion   Lower Extremity Assessment Lower Extremity Assessment: Defer to PT evaluation RLE Deficits / Details: grossly 2/5 LLE Deficits / Details: grossly 2/5       Communication Communication Communication: Expressive difficulties   Cognition Arousal/Alertness: Awake/alert Behavior During Therapy: [facility] for  tasks assessed/performed Overall Cognitive Status: History of cognitive impairments - at baseline                                       General Comments  VSS    Exercises     Shoulder Instructions      Home Living Family/patient expects to be discharged to:: Skilled nursing facility                                 Additional Comments: from SNF, was not active with PT, reports being transfered to [facility] and at times using feet to propel WC.  Likes to go to bingo      Prior Functioning/Environment Prior Level of Function : Needs assist   Cognitive Assist : Mobility (cognitive);ADLs (cognitive) Mobility (Cognitive): Intermittent cues ADLs (Cognitive): Intermittent cues Physical Assist : Mobility (physical);ADLs (physical) Mobility (physical): Bed mobility;Transfers ADLs (physical): Bathing;Dressing;Toileting;Feeding;Grooming Mobility Comments: Lift used to get pt to wheelchair ADLs Comments: Bed level ADL, but was able to feed herself        OT Problem List: Decreased cognition      OT Treatment/Interventions:      OT Goals(Current goals can be found in the care plan section) Acute Rehab OT Goals OT Goal Formulation: Patient unable to participate in goal setting Time For Goal Achievement: 09/12/21 Potential to Achieve Goals: Good  OT Frequency:  OT eval and discharge to next level.    Co-evaluation              AM-PAC OT "6 Clicks" Daily Activity     Outcome Measure Help from another person eating meals?: A Little Help from another person taking care of personal grooming?: A Lot Help from another person toileting, which includes using toliet, bedpan, or urinal?: Total Help from another person bathing (including washing, rinsing, drying)?: Total Help from another person to put on and taking off regular upper body clothing?: A Lot Help from another person to put on and taking off regular lower body clothing?: Total 6 Click Score: 10   End of Session Nurse Communication: Mobility status  Activity Tolerance: Patient tolerated treatment well Patient left: in bed;with call bell/phone within reach  OT Visit Diagnosis: Other symptoms and signs involving cognitive function                Time: 1400-1432 OT Time Calculation (min): 32 min Charges:  OT General Charges $OT Visit: 1 Visit OT Evaluation $OT Eval Moderate Complexity: 1 Mod OT Treatments $Self Care/Home Management : 8-22 mins  08/31/2021  RP, OTR/L  Acute Rehabilitation Services  Office:  [phone]   Metta Clines 08/31/2021, 2:36  PM

## 2021-08-31 NOTE — Evaluation (Signed)
Physical Therapy Evaluation and D/C Patient Details Name: [name] MRN: [id number] DOB: 1950/07/09 Today's Date: 08/31/2021  History of Present Illness  72 yo female admitted 7/10 from [facility] with AMS, fever and Rt hippocampal hemorrhage. PMHx: PE, DM, HTN, CVA, cellulitis, obesity, anxiety  Clinical Impression  Pt admitted with above diagnosis.  Pt currently at baseline status with SNF using lift to get pt OOB prior to this admit. Pt has been wheelchair bound for sometime now.  Do not feel that pt is appropriate for skilled PT.          Recommendations for follow up therapy are one component of a multi-disciplinary discharge planning process, led by the attending physician.  Recommendations may be updated based on patient status, additional functional criteria and insurance authorization.  Follow Up Recommendations Long-term institutional care without follow-up therapy Can patient physically be transported by private vehicle: No    Assistance Recommended at Discharge Frequent or constant Supervision/Assistance  Patient can return home with the following  Two people to help with walking and/or transfers;Two people to help with bathing/dressing/bathroom;Assistance with cooking/housework;Assist for transportation;Help with stairs or ramp for entrance    Equipment Recommendations None recommended by PT  Recommendations for Other Services       Functional Status Assessment Patient has not had a recent decline in their functional status     Precautions / Restrictions Precautions Precautions: Fall Restrictions Weight Bearing Restrictions: No      Mobility  Bed Mobility Overal bed mobility: Needs Assistance Bed Mobility: Rolling Rolling: Total assist, +2 for physical assistance, Max assist         General bed mobility comments: max assist to roll toright with use of rail.  total assist with 10 % initiation to roll to left.  Pt with large BM on arrival and PT and  tech assisted with cleaning pt and changing pad and gown.    Transfers                   General transfer comment: Use lift for safety    Ambulation/Gait               General Gait Details: N/A  Stairs            Wheelchair Mobility    Modified Rankin (Stroke Patients Only) Modified Rankin (Stroke Patients Only) Pre-Morbid Rankin Score: Severe disability Modified Rankin: Severe disability     Balance                                             Pertinent Vitals/Pain Pain Assessment Pain Assessment: No/denies pain    Home Living Family/patient expects to be discharged to:: Skilled nursing facility                   Additional Comments: from SNF, was not active with PT, reports being transfered to [facility] and at times using feet to propel WC.  Likes to go to bingo    Prior Function Prior Level of Function : Needs assist  Cognitive Assist : Mobility (cognitive);ADLs (cognitive) Mobility (Cognitive): Intermittent cues ADLs (Cognitive): Intermittent cues Physical Assist : Mobility (physical);ADLs (physical) Mobility (physical): Bed mobility;Transfers ADLs (physical): Bathing;Dressing;Toileting;Feeding;Grooming Mobility Comments: Lift used to get pt to wheelchair       Hand Dominance   Dominant Hand: Right    Extremity/Trunk Assessment  Upper Extremity Assessment Upper Extremity Assessment: Defer to OT evaluation    Lower Extremity Assessment Lower Extremity Assessment: RLE deficits/detail;LLE deficits/detail RLE Deficits / Details: grossly 2/5 LLE Deficits / Details: grossly 2/5       Communication   Communication: Expressive difficulties  Cognition Arousal/Alertness: Awake/alert Behavior During Therapy: Flat affect Overall Cognitive Status: Impaired/Different from baseline Area of Impairment: Following commands, Problem solving                       Following Commands: Follows one step commands  inconsistently     Problem Solving: Difficulty sequencing, Requires verbal cues, Decreased initiation, Slow processing          General Comments General comments (skin integrity, edema, etc.): VSS    Exercises General Exercises - Upper Extremity Shoulder Flexion: AROM, Both, 5 reps, Supine Elbow Flexion: AROM, Both, 5 reps, Supine   Assessment/Plan    PT Assessment Patient does not need any further PT services  PT Problem List         PT Treatment Interventions      PT Goals (Current goals can be found in the Care Plan section)  Acute Rehab PT Goals Patient Stated Goal: to go back to [facility] PT Goal Formulation: All assessment and education complete, DC therapy    Frequency       Co-evaluation               AM-PAC PT "6 Clicks" Mobility  Outcome Measure Help needed turning from your back to your side while in a flat bed without using bedrails?: Total Help needed moving from lying on your back to sitting on the side of a flat bed without using bedrails?: Total Help needed moving to and from a bed to a chair (including a wheelchair)?: Total Help needed standing up from a chair using your arms (e.g., wheelchair or bedside chair)?: Total Help needed to walk in hospital room?: Total Help needed climbing 3-5 steps with a railing? : Total 6 Click Score: 6    End of Session   Activity Tolerance: Patient limited by fatigue Patient left: in bed;with call bell/phone within reach;with bed alarm set Nurse Communication: Mobility status;Need for lift equipment PT Visit Diagnosis: Muscle weakness (generalized) (M62.81);Other abnormalities of gait and mobility (R26.89)    Time: 2956-2130 PT Time Calculation (min) (ACUTE ONLY): 30 min   Charges:   PT Evaluation $PT Eval Moderate Complexity: 1 Mod PT Treatments $Self Care/Home Management: 8-22        [facility] M,PT Acute Rehab Services [place] 08/31/2021, 2:15 PM

## 2021-08-31 NOTE — Progress Notes (Addendum)
STROKE TEAM PROGRESS NOTE   INTERVAL HISTORY Patient is sleeping, awakens easily to voice. She is able to state her name, state her age as "69". Not able to state current month, year or place. Able to repeat short phrases. PERRL, right gaze preference, will cross midline today, blink to threat bilaterally. She is able to follow simple commands. Able to lift bilateral arms antigravity. Failed swallow eval. Will place cortrak and start tube feeds today. Febrile overnight to 100.8. WBC up to 15.6 blood cultures pending.   Vitals:   08/31/21 0715 08/31/21 0730 08/31/21 0745 08/31/21 0800  BP:  (!) 147/72    Pulse: 61 68 83   Resp: '16 15 14   '$ Temp:    98 F (36.7 C)  TempSrc:    Axillary  SpO2: 100% 100% 100%   Weight:      Height:       CBC:  Recent Labs  Lab 08/29/21 1153 08/31/21 0119  WBC 11.3* 15.6*  NEUTROABS 10.3*  --   HGB 11.5* 10.8*  HCT 38.7 35.8*  MCV 87.4 86.7  PLT 327 097    Basic Metabolic Panel:  Recent Labs  Lab 08/29/21 1200 08/31/21 0119  NA 142 145  K 3.4* 2.6*  CL 106 111  CO2 24 23  GLUCOSE 133* 89  BUN 18 21  CREATININE 1.14* 1.46*  CALCIUM 7.8* 7.5*    Lipid Panel:  Recent Labs  Lab 08/29/21 1912  CHOL 112  TRIG 69  HDL 58  CHOLHDL 1.9  VLDL 14  LDLCALC 40    HgbA1c:  Recent Labs  Lab 08/29/21 1912  HGBA1C 5.6    Urine Drug Screen: No results for input(s): "LABOPIA", "COCAINSCRNUR", "LABBENZ", "AMPHETMU", "THCU", "LABBARB" in the last 168 hours.  Alcohol Level No results for input(s): "ETH" in the last 168 hours.  IMAGING past 24 hours ECHOCARDIOGRAM COMPLETE  Result Date: 08/30/2021    ECHOCARDIOGRAM REPORT   Patient Name:   ARYONA SILL Maiolo Date of Exam: 08/30/2021 Medical Rec #:  [id number]     Height:       66.0 in Accession #:    [id number]    Weight:       222.7 lb Date of Birth:  21-Mar-1950      BSA:          2.093 m Patient Age:    37 years      BP:           124/90 mmHg Patient Gender: F             HR:           110 bpm.  Exam Location:  Inpatient Procedure: 2D Echo, Cardiac Doppler and Color Doppler Indications:    Stroke  History:        Patient has prior history of Echocardiogram examinations, most                 recent 08/26/2020. Arrythmias:Tachycardia,                 Signs/Symptoms:Shortness of Breath and Altered Mental Status;                 Risk Factors:Morbid obesity.  Sonographer:    Merrie Roof RDCS Referring Phys: [id number] ASHISH ARORA IMPRESSIONS  1. Left ventricular ejection fraction, by estimation, is >75%. The left ventricle has hyperdynamic function. The left ventricle has no regional wall motion abnormalities. There is mild concentric left ventricular hypertrophy. Left ventricular  diastolic parameters are consistent with Grade I diastolic dysfunction (impaired relaxation).  2. Right ventricular systolic function is hyperdynamic. The right ventricular size is normal. Tricuspid regurgitation signal is inadequate for assessing PA pressure.  3. Left atrial size was severely dilated.  4. The mitral valve is normal in structure. No evidence of mitral valve regurgitation. No evidence of mitral stenosis.  5. The aortic valve is tricuspid. There is mild calcification of the aortic valve. There is mild thickening of the aortic valve. Aortic valve regurgitation is not visualized. Aortic valve sclerosis/calcification is present, without any evidence of aortic stenosis.  6. The inferior vena cava is collapsed, consistent with low right atrial pressures. Conclusion(s)/Recommendation(s): The study suggests hypovolemia. FINDINGS  Left Ventricle: There is mid-cavity obliteration with an intracavitary "gradient" of 3.8 m/s. Left ventricular ejection fraction, by estimation, is >75%. The left ventricle has hyperdynamic function. The left ventricle has no regional wall motion abnormalities. The left ventricular internal cavity size was normal in size. There is mild concentric left ventricular hypertrophy. Left ventricular diastolic  parameters are consistent with Grade I diastolic dysfunction (impaired relaxation). Indeterminate filling pressures. Right Ventricle: The right ventricular size is normal. No increase in right ventricular wall thickness. Right ventricular systolic function is hyperdynamic. Tricuspid regurgitation signal is inadequate for assessing PA pressure. Left Atrium: Left atrial size was severely dilated. Right Atrium: Right atrial size was normal in size. Pericardium: There is no evidence of pericardial effusion. Mitral Valve: The mitral valve is normal in structure. Mild mitral annular calcification. No evidence of mitral valve regurgitation. No evidence of mitral valve stenosis. Tricuspid Valve: The tricuspid valve is grossly normal. Tricuspid valve regurgitation is not demonstrated. Aortic Valve: Gradients are exaggerated by the hyperdynamic left ventricular contractility. The aortic valve is tricuspid. There is mild calcification of the aortic valve. There is mild thickening of the aortic valve. Aortic valve regurgitation is not visualized. Aortic valve sclerosis/calcification is present, without any evidence of aortic stenosis. Aortic valve mean gradient measures 11.4 mmHg. Aortic valve peak gradient measures 24.3 mmHg. Aortic valve area, by VTI measures 2.03 cm. Pulmonic Valve: The pulmonic valve was grossly normal. Pulmonic valve regurgitation is not visualized. Aorta: The aortic root and ascending aorta are structurally normal, with no evidence of dilitation. Venous  IAS/Shunts: The interatrial septum was not well visualized.  LEFT VENTRICLE PLAX 2D LVIDd:         4.40 cm   Diastology LVIDs:         2.50 cm   LV e' medial:    5.44 cm/s LV PW:         1.20 cm   LV E/e' medial:  14.4 LV IVS:        1.20 cm   LV e' lateral:   8.55 cm/s LVOT diam:     2.20 cm   LV E/e' lateral: 9.2 LV SV:         70 LV SV Index:   33 LVOT Area:     3.80 cm  RIGHT VENTRICLE RV Basal diam:  2.90 cm LEFT ATRIUM              Index         RIGHT ATRIUM           Index LA diam:        5.00 cm  2.39 cm/m   RA Area:     10.80 cm LA Vol (A2C):   65.2 ml  31.15 ml/m  RA Volume:  20.10 ml  9.60 ml/m LA Vol (A4C):   113.0 ml 53.98 ml/m LA Biplane Vol: 85.9 ml  41.04 ml/m  AORTIC VALVE AV Area (Vmax):    2.05 cm AV Area (Vmean):   2.19 cm AV Area (VTI):     2.03 cm AV Vmax:           246.50 cm/s AV Vmean:          151.912 cm/s AV VTI:            0.345 m AV Peak Grad:      24.3 mmHg AV Mean Grad:      11.4 mmHg LVOT Vmax:         133.00 cm/s LVOT Vmean:        87.400 cm/s LVOT VTI:          0.184 m LVOT/AV VTI ratio: 0.53  AORTA Ao Root diam: 3.20 cm Ao Asc diam:  2.70 cm MITRAL VALVE MV Area (PHT): 4.80 cm     SHUNTS MV Decel Time: 158 msec     Systemic VTI:  0.18 m MV E velocity: 78.60 cm/s   Systemic Diam: 2.20 cm MV A velocity: 134.00 cm/s MV E/A ratio:  0.59 Mihai Croitoru MD Electronically signed by Sanda Klein MD Signature Date/Time: 08/30/2021/6:06:37 PM    Final     PHYSICAL EXAM She is able to state her name, age as "35", able to state place. Able to identify her brother in law with correct name.Not able to name objects. Able to repeat 3 word sentences. She has a right gaze does not cross midline, no blink to threat on left. RN states she can intermittently cross midline. She is able to follow simple commands. Has tremors on right hand. Left arm antigravity. Left side neglect. Withdraws to painful stimuli.  ASSESSMENT/PLAN Ms. LYLLA EIFLER is a 71 y.o. 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 CT findings of the ICH, she was given Andexxa for reversal  ICH: Acute Right hippocampus ICH, likely due to hypertension in the setting of eliquis use CT head Region of hemorrhage within the hippocampus on the right, probably representing a hemorrhagic infarction.  Repeat CT  head stable hemorrhage of the right hippocampus, 6-7 mL. Questionable trace intraventricular extension of blood is stable with no ventriculomegaly. MRI  No significant interval change in intraparenchymal hemorrhage centered at the posterior right hippocampal formation. Trace layering blood within the occipital horn of the left lateral ventricle. Multiple additional scattered chronic micro hemorrhages clustered about the cerebellum, brainstem, and thalami, most characteristic of chronic poorly controlled hypertension. EEG moderate diffuse encephalopathy 2D Echo EF >75% LDL 40 HgbA1c 5.6 VTE prophylaxis - Heparin SQ aspirin 81 mg daily and Eliquis BID  prior to admission, now on No antithrombotic.  Therapy recommendations: Back to SNF Disposition:  pending  Hx of DVT/PE on eliquis S/p Andexxa reversal CTA chest no PE AC on hold  Hx of stroke 03/2015 TIA 07/2019 admitted for speech difficulty, dysphagia, MRI showed bilateral infarcts.  Xarelto changed to Eliquis 06/2020 admitted for right-sided weakness and word finding difficulty.  MRI negative for acute infarct, but old left frontal infarct, left BG/CR, thalamus and pontine infarcts.  MRI negative. 08/2020 admitted for decreased talking.  CT negative.  EEG showed encephalopathy.  Hypertensive emergency Home meds:  lisinopril, verapamil Stable BP goal < 160 Off cleviprex gtt  now Long-term BP goal normotensive  Hyperlipidemia Home meds:  lipitor 40 LDL 40, goal < 70 Can continue statin at discharge  Diabetes type II Controlled Home meds:  metformin HgbA1c 5.6, goal < 7.0 Monitor CBGs SSI Close PCP follow up as outpt  Fever Leukocytosis Tachycardia Leukocytosis WBC 11.3->15.6 Tmax 101-100.8-> afebrile Tachycardia, resolved CXR negative.  UA negative. Blood cultures NGTD Etiology not quite clear May consider CCM consult if needed  Dysphagia [place] swallow now Follow-up food now due to nausea vomiting this morning Speech on  board Resume diet once able  Other Stroke Risk Factors Advanced Age >/= 64  Obesity, Body mass index is 35.94 kg/m., BMI >/= 30 associated with increased stroke risk, recommend weight loss, diet and exercise as appropriate   Other Active Problems AKI on CKD creatinine 1.14-> 1.46, on IV fluid IDA Hypokalemia K 2.6. Being replaced. Will recheck BMP this afternoon  Hospital day # [place] DNP, ACNPC-AG   ATTENDING NOTE: I reviewed above note and agree with the assessment and plan. Pt was seen and examined.   RN at bedside.  Patient awake alert, smiling, in good spirit, much more interactive than yesterday.  Stated his age 72, able to tell hospital 1 given choices, no aphasia, able to have meaningful sentences, able to name 2/2 and repeat, mild dysarthria, slight psychomotor slowing.  Seem to have mild right nasolabial fold flattening, not sure if the baseline.  Bilateral upper extremity proximal arm lifting 1-2/5, bicep and tricep 3/5, finger grip 3/5.  Bilateral lower extremity proximal 1-2/5, distal toe movement 3/5.  Sensation subjectively symmetrical, bilateral finger-to-nose slow but grossly intact.  Patient currently off Cleviprex, although passed swallow, however not given food yet due to [place] vomiting this morning.  We will check swallow afternoon again, continue IV fluid.  Once p.o. access, may consider BP meds orally.  However, patient found to have  worsening leukocytosis and AKI, continue IV fluid.  Currently afebrile, tachycardia resolved, will continue monitoring.  For detailed assessment and plan, please refer to above/below as I have made changes wherever appropriate.   Rosalin Hawking, MD PhD Stroke Neurology 08/31/2021 2:28 PM  This patient is critically ill due to [place], fever, leukocytosis and tachycardia, hypertensive emergency and at significant risk of neurological worsening, death form hematoma expansion, sepsis, hypertensive encephalopathy, brain  herniation. This patient's care requires constant monitoring of vital signs, hemodynamics, respiratory and cardiac monitoring, review of multiple databases, neurological assessment, discussion with family, other specialists and medical decision making of high complexity. I spent 40 minutes of neurocritical care time in the care of this patient.    To contact Stroke Continuity provider, please refer to [email or web address] After hours, contact General Neurology

## 2021-09-01 DIAGNOSIS — Z86718 Personal history of other venous thrombosis and embolism: Secondary | ICD-10-CM | POA: Diagnosis not present

## 2021-09-01 DIAGNOSIS — Z7901 Long term (current) use of anticoagulants: Secondary | ICD-10-CM | POA: Diagnosis not present

## 2021-09-01 DIAGNOSIS — I619 Nontraumatic intracerebral hemorrhage, unspecified: Secondary | ICD-10-CM | POA: Diagnosis not present

## 2021-09-01 DIAGNOSIS — I610 Nontraumatic intracerebral hemorrhage in hemisphere, subcortical: Secondary | ICD-10-CM | POA: Diagnosis not present

## 2021-09-01 LAB — BASIC METABOLIC PANEL
Anion gap: 16 — ABNORMAL HIGH (ref 5–15)
Anion gap: 15 (ref 5–15)
BUN: 22 mg/dL (ref 8–23)
BUN: 19 mg/dL (ref 8–23)
CO2: 22 mmol/L (ref 22–32)
CO2: 21 mmol/L — ABNORMAL LOW (ref 22–32)
Calcium: 8 mg/dL — ABNORMAL LOW (ref 8.9–10.3)
Calcium: 7.8 mg/dL — ABNORMAL LOW (ref 8.9–10.3)
Chloride: 110 mmol/L (ref 98–111)
Chloride: 110 mmol/L (ref 98–111)
Creatinine, Ser: 1.62 mg/dL — ABNORMAL HIGH (ref 0.44–1.00)
Creatinine, Ser: 1.53 mg/dL — ABNORMAL HIGH (ref 0.44–1.00)
GFR, Estimated: 34 mL/min — ABNORMAL LOW (ref >60)
GFR, Estimated: 36 mL/min — ABNORMAL LOW (ref >60)
Glucose, Bld: 92 mg/dL (ref 70–99)
Glucose, Bld: 105 mg/dL — ABNORMAL HIGH (ref 70–99)
Potassium: 3.1 mmol/L — ABNORMAL LOW (ref 3.5–5.1)
Potassium: 3.2 mmol/L — ABNORMAL LOW (ref 3.5–5.1)
Sodium: 148 mmol/L — ABNORMAL HIGH (ref 135–145)
Sodium: 146 mmol/L — ABNORMAL HIGH (ref 135–145)

## 2021-09-01 LAB — CBC
HCT: 37.1 % (ref 36.0–46.0)
Hemoglobin: 11 g/dL — ABNORMAL LOW (ref 12.0–15.0)
MCH: 26.1 pg (ref 26.0–34.0)
MCHC: 29.6 g/dL — ABNORMAL LOW (ref 30.0–36.0)
MCV: 87.9 fL (ref 80.0–100.0)
Platelets: 265 10*3/uL (ref 150–400)
RBC: 4.22 MIL/uL (ref 3.87–5.11)
RDW: 15.7 % — ABNORMAL HIGH (ref 11.5–15.5)
WBC: 15.7 10*3/uL — ABNORMAL HIGH (ref 4.0–10.5)
nRBC: 0 % (ref 0.0–0.2)

## 2021-09-01 LAB — MAGNESIUM
Magnesium: 1.1 mg/dL — ABNORMAL LOW (ref 1.7–2.4)
Magnesium: 1.9 mg/dL (ref 1.7–2.4)

## 2021-09-01 LAB — GLUCOSE, CAPILLARY
Glucose-Capillary: 94 mg/dL (ref 70–99)
Glucose-Capillary: 75 mg/dL (ref 70–99)
Glucose-Capillary: 80 mg/dL (ref 70–99)
Glucose-Capillary: 111 mg/dL — ABNORMAL HIGH (ref 70–99)
Glucose-Capillary: 155 mg/dL — ABNORMAL HIGH (ref 70–99)
Glucose-Capillary: 155 mg/dL — ABNORMAL HIGH (ref 70–99)

## 2021-09-01 MED ORDER — MAGNESIUM SULFATE 4 GM/100ML IV SOLN
4.0000 g | Freq: Once | INTRAVENOUS | Status: AC
Start: 2021-09-01 — End: 2021-09-01
  Administered 2021-09-01: 4 g via INTRAVENOUS
  Filled 2021-09-01 (×2): qty 100

## 2021-09-01 MED ORDER — FERROUS SULFATE 325 (65 FE) MG PO TABS
325.0000 mg | ORAL_TABLET | Freq: Every day | ORAL | Status: DC
  Administered 2021-09-02 – 2021-09-05 (×4): 325 mg via ORAL
  Filled 2021-09-01 (×5): qty 1

## 2021-09-01 MED ORDER — HYDRALAZINE HCL 20 MG/ML IJ SOLN
10.0000 mg | Freq: Four times a day (QID) | INTRAMUSCULAR | Status: DC | PRN
  Administered 2021-09-06: 10 mg via INTRAVENOUS
  Filled 2021-09-01: qty 1

## 2021-09-01 MED ORDER — VERAPAMIL HCL ER 180 MG PO TBCR
180.0000 mg | EXTENDED_RELEASE_TABLET | Freq: Every day | ORAL | Status: DC
  Administered 2021-09-01 – 2021-09-05 (×5): 180 mg via ORAL
  Filled 2021-09-01 (×8): qty 1

## 2021-09-01 MED ORDER — HYDRALAZINE HCL 50 MG PO TABS
50.0000 mg | ORAL_TABLET | Freq: Three times a day (TID) | ORAL | Status: DC
  Administered 2021-09-01 – 2021-09-06 (×16): 50 mg via ORAL
  Filled 2021-09-01 (×17): qty 1

## 2021-09-01 MED ORDER — POTASSIUM CHLORIDE 10 MEQ/100ML IV SOLN
10.0000 meq | INTRAVENOUS | Status: AC
  Administered 2021-09-01 (×4): 10 meq via INTRAVENOUS
  Filled 2021-09-01 (×4): qty 100

## 2021-09-01 NOTE — NC FL2 (Signed)
[place] MEDICAID FL2 LEVEL OF CARE SCREENING TOOL     IDENTIFICATION  Patient Name: [name] Birthdate: February 15, 1951 Sex: female Admission Date (Current Location): 08/29/2021  [facility] and [place] Number:  Herbalist and Address:  [facility] [place], [place] [address] [place], [place] [address]      Provider Number: [id number]  Attending Physician Name and Address:  Stroke, Md, MD  Relative Name and Phone Number:       Current Level of Care: Hospital Recommended Level of Care: [place] Prior Approval Number:    Date Approved/Denied:   PASRR Number: [id number]  Discharge Plan: SNF    Current Diagnoses: Patient Active Problem List   Diagnosis Date Noted   Hemorrhagic stroke ([place]) 08/29/2021   ICH (intracerebral hemorrhage) ([place]) 08/29/2021   Pressure injury of skin 08/26/2020   Encephalopathy acute 08/24/2020   SIRS (systemic inflammatory response syndrome) ([place]) 08/21/2020   UTI (urinary tract infection) 08/21/2020   Hyperkalemia 08/21/2020   Hypoalbuminemia due to protein-calorie malnutrition ([place]) 39/76/7341   Acute metabolic encephalopathy 93/79/0240   CVA (cerebral vascular accident) ([place]) 07/24/2018   Depression 04/12/2015   TIA (transient ischemic attack) 04/09/2015   Aphasia 04/09/2015   Cellulitis and abscess of leg 04/09/2015   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   Fever, unspecified 12/25/2014   Sepsis ([place]) 12/25/2014   Cellulitis 12/25/2014   Essential hypertension, benign 12/20/2011   History of pulmonary embolus (PE) 12/20/2011   Morbid obesity ([place]) 12/20/2011   Preoperative cardiovascular examination 12/20/2011   Type 2 diabetes mellitus ([place]) 09/11/2006    Orientation RESPIRATION BLADDER Height & Weight     Self, Place  O2 (2L [place]) External catheter Weight: 222 lb 11.2 oz (101 kg) Height:  '5\' 6"'$  (167.6 cm)  BEHAVIORAL SYMPTOMS/MOOD NEUROLOGICAL BOWEL NUTRITION STATUS      Incontinent Diet (please see  dc summary)  AMBULATORY STATUS COMMUNICATION OF NEEDS Skin   Extensive Assist Verbally PU Stage and Appropriate Care                       Personal Care Assistance Level of Assistance  Bathing, Feeding, Dressing Bathing Assistance: Maximum assistance Feeding assistance: Limited assistance Dressing Assistance: Maximum assistance     Functional Limitations Info  Sight, Hearing, Speech Sight Info: Impaired Hearing Info: Adequate Speech Info: Adequate    SPECIAL CARE FACTORS FREQUENCY                       Contractures Contractures Info: Not present    Additional Factors Info  Code Status Code Status Info: FULL CODE             Current Medications (09/01/2021):  This is the current hospital active medication list Current Facility-Administered Medications  Medication Dose Route Frequency Provider Last Rate Last Admin   0.9 %  sodium chloride infusion   Intravenous Continuous Rosalin Hawking, MD 75 mL/hr at 09/01/21 0800 Infusion Verify at 09/01/21 0800   acetaminophen (TYLENOL) tablet 650 mg  650 mg Oral Q4H PRN Amie Portland, MD       Or   acetaminophen (TYLENOL) 160 MG/5ML solution 650 mg  650 mg Per Tube Q4H PRN Amie Portland, MD       Or   acetaminophen (TYLENOL) suppository 650 mg  650 mg Rectal Q4H PRN Amie Portland, MD   650 mg at 08/30/21 0040   Chlorhexidine Gluconate Cloth 2 % PADS 6 each  6  each Topical Daily Amie Portland, MD   6 each at 08/31/21 0928   [START ON 09/02/2021] ferrous sulfate tablet 325 mg  325 mg Oral Q breakfast Darliss Cheney, MD       heparin injection 5,000 Units  5,000 Units Subcutaneous Q8H Beulah Gandy A, NP   5,000 Units at 09/01/21 3790   hydrALAZINE (APRESOLINE) injection 10 mg  10 mg Intravenous Q6H PRN Darliss Cheney, MD       hydrALAZINE (APRESOLINE) tablet 50 mg  50 mg Oral Q8H Pahwani, Ravi, MD       insulin aspart (novoLOG) injection 0-24 Units  0-24 Units Subcutaneous Q4H Amie Portland, MD   2 Units at 08/31/21 2349    labetalol (NORMODYNE) injection 5-20 mg  5-20 mg Intravenous Q2H PRN Rosalin Hawking, MD       magnesium sulfate IVPB 4 g 100 mL  4 g Intravenous Once Darliss Cheney, MD       mupirocin ointment (BACTROBAN) 2 % 1 Application  1 Application Nasal BID Amie Portland, MD   1 Application at 24/09/73 2156   Oral care mouth rinse  15 mL Mouth Rinse PRN Rosalin Hawking, MD       pantoprazole (PROTONIX) injection 40 mg  40 mg Intravenous QHS Amie Portland, MD   40 mg at 08/31/21 2201   potassium chloride 10 mEq in 100 mL IVPB  10 mEq Intravenous Q1 Hr x 4 Rinehuls, David L, PA-C 100 mL/hr at 09/01/21 0800 Infusion Verify at 09/01/21 0800   senna-docusate (Senokot-S) tablet 1 tablet  1 tablet Oral BID Amie Portland, MD       verapamil (CALAN-SR) CR tablet 180 mg  180 mg Oral Daily Darliss Cheney, MD         Discharge Medications: Please see discharge summary for a list of discharge medications.  Relevant Imaging Results:  Relevant Lab Results:   Additional Information SSN: [id number]  Amador Cunas, [place]

## 2021-09-01 NOTE — Progress Notes (Signed)
STROKE TEAM PROGRESS NOTE   INTERVAL HISTORY Brother-in-law is at bedside.  Patient lying in bed, awake alert, interactive.  She had some vomiting yesterday after meal, was having difficulty keeping everything down last night.  This morning speech therapist cleared her for regular diet and thin liquid.  Will DC IV fluid if she is able to eat adequately.  Still has worsening leukocytosis and creatinine.  Hospitalist service on board.  Vitals:   09/01/21 1500 09/01/21 1600 09/01/21 1700 09/01/21 1730  BP: 99/80 (!) 1'34/44 90/61 94/62 '$  Pulse: 92 92 88 92  Resp: (!) '22 18 17 19  '$ Temp:  98.8 F (37.1 C)    TempSrc:  Axillary    SpO2: 100% 100% 100% 100%  Weight:      Height:       CBC:  Recent Labs  Lab 08/29/21 1153 08/31/21 0119 09/01/21 0252  WBC 11.3* 15.6* 15.7*  NEUTROABS 10.3*  --   --   HGB 11.5* 10.8* 11.0*  HCT 38.7 35.8* 37.1  MCV 87.4 86.7 87.9  PLT 327 253 196   Basic Metabolic Panel:  Recent Labs  Lab 09/01/21 0252 09/01/21 1534  NA 148* 146*  K 3.1* 3.2*  CL 110 110  CO2 22 21*  GLUCOSE 92 105*  BUN 22 19  CREATININE 1.62* 1.53*  CALCIUM 8.0* 7.8*  MG 1.1* 1.9   Lipid Panel:  Recent Labs  Lab 08/29/21 1912  CHOL 112  TRIG 69  HDL 58  CHOLHDL 1.9  VLDL 14  LDLCALC 40   HgbA1c:  Recent Labs  Lab 08/29/21 1912  HGBA1C 5.6   Urine Drug Screen: No results for input(s): "LABOPIA", "COCAINSCRNUR", "LABBENZ", "AMPHETMU", "THCU", "LABBARB" in the last 168 hours.  Alcohol Level No results for input(s): "ETH" in the last 168 hours.  IMAGING past 24 hours No results found.  PHYSICAL EXAM She is able to state her name, age as "48", able to state place. Able to identify her brother in law with correct name.Not able to name objects. Able to repeat 3 word sentences. She has a right gaze does not cross midline, no blink to threat on left. RN states she can intermittently cross midline. She is able to follow simple commands. Has tremors on right hand. Left  arm antigravity. Left side neglect. Withdraws to painful stimuli.  ASSESSMENT/PLAN [name] is a 71 y.o. 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 CT findings of the ICH, she was given Andexxa for reversal  ICH: Acute Right hippocampus ICH, likely due to hypertension in the setting of eliquis use CT head Region of hemorrhage within the hippocampus on the right, probably representing a hemorrhagic infarction.  Repeat CT head stable hemorrhage of the right hippocampus, 6-7 mL. Questionable trace intraventricular extension of blood is stable with no ventriculomegaly. MRI  No significant interval change in intraparenchymal hemorrhage centered at the posterior right hippocampal formation. Trace layering blood within the occipital horn of the left lateral ventricle. Multiple additional scattered chronic micro hemorrhages clustered about the cerebellum, brainstem, and thalami, most characteristic of chronic poorly controlled hypertension. EEG moderate diffuse encephalopathy 2D Echo EF >75% LDL 40.  HgbA1c 5.6 VTE prophylaxis - Heparin SQ aspirin 81 mg daily and Eliquis BID  prior to admission, now on No antithrombotic.  Therapy recommendations: Back to SNF Disposition:  pending  Hx of DVT/PE on  eliquis S/p Andexxa reversal CTA chest no PE AC on hold Not sure the duration of AC, if still needed, may consider to resume AC once ICH resolved on CT in 3-4 weeks.   Hx of stroke 03/2015 TIA 07/2019 admitted for speech difficulty, dysphagia, MRI showed bilateral infarcts.  Xarelto changed to Eliquis 06/2020 admitted for right-sided weakness and word finding difficulty.  MRI negative for acute infarct, but old left frontal infarct, left BG/CR, thalamus and pontine infarcts.  MRI negative. 08/2020 admitted for decreased talking.  CT  negative.  EEG showed encephalopathy.  Hypertensive emergency Home meds:  lisinopril, verapamil Stable BP goal < 160 Off cleviprex gtt now Long-term BP goal normotensive  Hyperlipidemia Home meds:  lipitor 40 LDL 40, goal < 70 Can continue statin at discharge  Diabetes type II Controlled Home meds:  metformin HgbA1c 5.6, goal < 7.0 Monitor CBGs SSI Close PCP follow up as outpt  Fever Leukocytosis Tachycardia Leukocytosis WBC 11.3->15.6->15.7 Tmax 101-100.8-> afebrile Tachycardia, resolved CXR negative.  UA negative. Blood cultures NGTD Etiology not quite clear  AKI on CKD 3a creatinine 1.14-> 1.46-1.53-1.62-1.53 on IV fluid Encourage p.o. intake  Dysphagia [place] swallow now Follow-up food now due to nausea vomiting this morning Speech on board Resume diet once able  Other Stroke Risk Factors Advanced Age >/= 59  Obesity, Body mass index is 35.94 kg/m., BMI >/= 30 associated with increased stroke risk, recommend weight loss, diet and exercise as appropriate   Other Active Problems AKI on CKD  IDA Hypokalemia K 2.6->2.8->3.1->3.2, magnesium 1.1->1.9, continue BMP monitoring  Hospital day # 3  [name] Hawking, MD PhD Stroke Neurology 09/01/2021 5:58 PM    To contact Stroke Continuity provider, please refer to [email or web address] After hours, contact General Neurology

## 2021-09-01 NOTE — Progress Notes (Signed)
Speech Language Pathology Treatment: Dysphagia  Patient Details Name: [name] MRN: [id number] DOB: 1951-02-09 Today's Date: 09/01/2021 Time: 8416-6063 SLP Time Calculation (min) (ACUTE ONLY): 10 min  Assessment / Plan / Recommendation Clinical Impression  Pt tolerating thin liquids, now alert, oriented able to self feed with set up assist. Masticating regular solids. No further nausea, will advance to regular/thin and sign off for swallowing. Language even more fluent today, but paraphasias still occurring with pt self correcting in most cases. Pt likely close to prior baseline, would benefit from SLP f/u at SNF level for language therapy.    HPI HPI: [name] is a 71 y.o. 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    Outpatient follow-up with Dr. April Manson in September 2022 for the episodes of slurred speech/aphasia x2 reports patient chronically wheelchair-bound with barely antigravity in lower extremity strength and 4/5 bilateral upper extremity strength, poor recall but at baseline awake and oriented to person. Pt has been evalauted by SLP at bedside in 2022, no signs of aspiration.      SLP Plan  All goals met      Recommendations for follow up therapy are one component of a multi-disciplinary discharge planning process, led by the attending physician.  Recommendations may be updated based on patient status, additional functional criteria and insurance authorization.    Recommendations  Diet recommendations: Regular;Thin liquid Liquids provided via:  Cup;Straw Medication Administration: Whole meds with liquid Supervision: Patient able to self feed                Follow Up Recommendations: Skilled nursing-short term rehab (<3 hours/day) Assistance recommended at discharge: Frequent or constant Supervision/Assistance SLP Visit Diagnosis: Dysphagia, unspecified (R13.10) Plan: All goals met           Prudencio Velazco, Katherene Ponto  09/01/2021, 2:26 PM

## 2021-09-01 NOTE — TOC CAGE-AID Note (Signed)
Transition of Care [facility]) - CAGE-AID Screening   Patient Details  Name: [name] MRN: [id number] Date of Birth: [birth year]  [facility] of [facility]) CM/SW Contact:    Coralee Pesa, [place] Phone Number: 09/01/2021, 10:27 AM   Clinical Narrative:  Per chart review, pt is unable to participate in assessment due to disorientation.  CAGE-AID Screening: Substance Abuse Screening unable to be completed due to: : Patient unable to participate             Substance Abuse Education Offered: No

## 2021-09-01 NOTE — Progress Notes (Signed)
PROGRESS NOTE    [name]  SJG:[id number] DOB: 1950/10/22 DOA: 08/29/2021 PCP: Caprice Renshaw, MD   Brief Narrative:  [name] is a 71 y.o. 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  Extensive small vessel ischemic disease.  CT angio chest negative for PE. Transferred to [facility] for further management. She was evaluated in the past for some episodes of expressive aphasia without evidence of stroke on MRI.  EEG in July 2022 moderate to severe diffuse encephalopathy nonspecific etiology. Outpatient follow-up with Dr. April Manson in September 2022 for the episodes of slurred speech/aphasia x2 reports patient chronically wheelchair-bound with barely antigravity in lower extremity strength and 4/5 bilateral upper extremity strength, poor recall but at baseline awake and oriented to person.   After the CT findings of the ICH, she was given Andexxa for reversal of Eliquis. Cleviprex started to control blood pressure she was hypertensive on arrival.  She was transferred to [facility] under neurology.  Subsequently transferred under [place] on 09/01/2021.  Assessment & Plan:   Principal Problem:   Hemorrhagic stroke [facility]) Active Problems:   ICH (intracerebral hemorrhage) ([place])  ICH/acute right hippocampus ICH: Likely due to hypertension in the setting of Eliquis use: CT head Region of hemorrhage within the hippocampus on the right, probably representing a hemorrhagic infarction. Repeat CT head stable hemorrhage of the right hippocampus, 6-7 mL. Questionable trace intraventricular extension of blood is stable with no ventriculomegaly.MRI  No significant interval change in  intraparenchymal hemorrhage centered at the posterior right hippocampal formation. Trace layering blood within the occipital horn of the left lateral ventricle. Multiple additional scattered chronic micro hemorrhages clustered about the cerebellum, brainstem, and thalami, most characteristic of chronic poorly controlled hypertension.EEG moderate diffuse encephalopathy. aspirin 81 mg daily and Eliquis BID  prior to admission, now on No antithrombotic.  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  Hx of stroke 03/2015 TIA 07/2019 admitted for speech difficulty, dysphagia, MRI showed bilateral infarcts.  Xarelto changed to Eliquis 06/2020 admitted for right-sided weakness and word finding difficulty.  MRI negative for acute infarct, but old left frontal infarct, left BG/CR, thalamus and pontine infarcts.  MRI negative. 08/2020 admitted for decreased talking.  CT negative.  EEG showed encephalopathy.  Hypertensive urgency: Blood pressure was elevated this morning, resumed her verapamil, added hydralazine.  Blood pressure now better.  Fever/tachycardia/leukocytosis: Reportedly she had fever and tachycardia and she also has leukocytosis.  All infectious work-up was negative.  Blood culture remain negative as well.  She was managed off of antibiotics and observed, she has not had any fever in the last 24 hours at least that I can see.  Dysphagia: Resolved.  She is now on regular diet.  Speech was on board.  Hypomagnesemia: We will replace.  Hypokalemia: We will replace.  Hypernatremia: Monitor closely.  DVT prophylaxis: heparin injection 5,000 Units Start: 08/30/21 1400 SCD's Start: 08/29/21 1848   Code Status: Full Code  Family Communication: Brother-in-law present at bedside.  Plan of care discussed with patient in length and he/she verbalized understanding and agreed with it.  Status is: Inpatient Remains inpatient appropriate  because: Per neurology  Estimated body mass index is 35.94 kg/m as calculated from the following:   Height as of this encounter: '5\' 6"'$  (1.676 m).   Weight as of this encounter: 101 kg.  Pressure Injury 08/25/20 Buttocks Left;Medial Stage 2 -  Partial thickness loss of dermis presenting as a shallow open injury with a red, pink wound bed without slough. (Active)  08/25/20 2100  Location: Buttocks  Location Orientation: Left;Medial  Staging: Stage 2 -  Partial thickness loss of dermis presenting as a shallow open injury with a red, pink wound bed without slough.  Wound Description (Comments):   Present on Admission:      Pressure Injury 08/25/20 Coccyx Mid Stage 2 -  Partial thickness loss of dermis presenting as a shallow open injury with a red, pink wound bed without slough. (Active)  08/25/20 2100  Location: Coccyx  Location Orientation: Mid  Staging: Stage 2 -  Partial thickness loss of dermis presenting as a shallow open injury with a red, pink wound bed without slough.  Wound Description (Comments):   Present on Admission: Yes   Nutritional Assessment: Body mass index is 35.94 kg/m.Marland Kitchen Seen by dietician.  I agree with the assessment and plan as outlined below: Nutrition Status:        . Skin Assessment: I have examined the patient's skin and I agree with the wound assessment as performed by the wound care RN as outlined below: Pressure Injury 08/25/20 Buttocks Left;Medial Stage 2 -  Partial thickness loss of dermis presenting as a shallow open injury with a red, pink wound bed without slough. (Active)  08/25/20 2100  Location: Buttocks  Location Orientation: Left;Medial  Staging: Stage 2 -  Partial thickness loss of dermis presenting as a shallow open injury with a red, pink wound bed without slough.  Wound Description (Comments):   Present on Admission:      Pressure Injury 08/25/20 Coccyx Mid Stage 2 -  Partial thickness loss of dermis presenting as a shallow open  injury with a red, pink wound bed without slough. (Active)  08/25/20 2100  Location: Coccyx  Location Orientation: Mid  Staging: Stage 2 -  Partial thickness loss of dermis presenting as a shallow open injury with a red, pink wound bed without slough.  Wound Description (Comments):   Present on Admission: Yes    Consultants:  Neurology  Procedures:  None  Antimicrobials:  Anti-infectives (From admission, onward)    Start     Dose/Rate Route Frequency Ordered Stop   08/29/21 1200  cefTRIAXone (ROCEPHIN) 2 g in sodium chloride 0.9 % 100 mL IVPB        2 g 200 mL/hr over 30 Minutes Intravenous  Once 08/29/21 1153 08/29/21 1330         Subjective: Seen and examined.  She had no complaint.  Family member at the bedside.  Objective: Vitals:   09/01/21 0930 09/01/21 1100 09/01/21 1134 09/01/21 1230  BP: (!) 146/67 (!) 154/119 (!) 150/42 (!) 112/50  Pulse: 70 95  88  Resp: 20 (!) 26  17  Temp:      TempSrc:      SpO2: 100% 100%  100%  Weight:      Height:        Intake/Output Summary (Last 24 hours) at 09/01/2021 1429 Last data filed at 09/01/2021 1200 Gross per 24 hour  Intake 2341.98 ml  Output 251 ml  Net 2090.98 ml   Filed Weights   08/29/21 1137  Weight: 101 kg  Examination:  General exam: Appears calm and comfortable  Respiratory system: Clear to auscultation. Respiratory effort normal. Cardiovascular system: S1 & S2 heard, RRR. No JVD, murmurs, rubs, gallops or clicks. No pedal edema. Gastrointestinal system: Abdomen is nondistended, soft and nontender. No organomegaly or masses felt. Normal bowel sounds heard. Central nervous system: Alert and oriented. No focal neurological deficits. Extremities: Symmetric 5 x 5 power. Skin: No rashes, lesions or ulcers Psychiatry: Judgement and insight appear poor   Data Reviewed: I have personally reviewed following labs and imaging studies  CBC: Recent Labs  Lab 08/29/21 1153 08/31/21 0119 09/01/21 0252   WBC 11.3* 15.6* 15.7*  NEUTROABS 10.3*  --   --   HGB 11.5* 10.8* 11.0*  HCT 38.7 35.8* 37.1  MCV 87.4 86.7 87.9  PLT 327 253 016   Basic Metabolic Panel: Recent Labs  Lab 08/29/21 1200 08/31/21 0119 08/31/21 1610 09/01/21 0252  NA 142 145 146* 148*  K 3.4* 2.6* 2.8* 3.1*  CL 106 111 111 110  CO2 '24 23 22 22  '$ GLUCOSE 133* 89 113* 92  BUN '18 21 19 22  '$ CREATININE 1.14* 1.46* 1.53* 1.62*  CALCIUM 7.8* 7.5* 7.8* 8.0*  MG  --   --   --  1.1*   GFR: Estimated Creatinine Clearance: 38.2 mL/min (A) (by C-G formula based on SCr of 1.62 mg/dL (H)). Liver Function Tests: Recent Labs  Lab 08/29/21 1200  AST 11*  ALT 10  ALKPHOS 65  BILITOT 0.9  PROT 7.3  ALBUMIN 3.0*   No results for input(s): "LIPASE", "AMYLASE" in the last 168 hours. No results for input(s): "AMMONIA" in the last 168 hours. Coagulation Profile: Recent Labs  Lab 08/29/21 1153  INR 1.4*   Cardiac Enzymes: No results for input(s): "CKTOTAL", "CKMB", "CKMBINDEX", "TROPONINI" in the last 168 hours. BNP (last 3 results) No results for input(s): "PROBNP" in the last 8760 hours. HbA1C: Recent Labs    08/29/21 1912  HGBA1C 5.6   CBG: Recent Labs  Lab 08/31/21 1939 08/31/21 2325 09/01/21 0329 09/01/21 0715 09/01/21 1144  GLUCAP 108* 127* 94 75 80   Lipid Profile: Recent Labs    08/29/21 1912  CHOL 112  HDL 58  LDLCALC 40  TRIG 69  CHOLHDL 1.9   Thyroid Function Tests: No results for input(s): "TSH", "T4TOTAL", "FREET4", "T3FREE", "THYROIDAB" in the last 72 hours. Anemia Panel: No results for input(s): "VITAMINB12", "FOLATE", "FERRITIN", "TIBC", "IRON", "RETICCTPCT" in the last 72 hours. Sepsis Labs: Recent Labs  Lab 08/29/21 1153 08/29/21 1355  LATICACIDVEN 1.2 1.1    Recent Results (from the past 240 hour(s))  Blood Culture (routine x 2)     Status: None (Preliminary result)   Collection Time: 08/29/21 11:50 AM   Specimen: BLOOD  Result Value Ref Range Status   Specimen  Description BLOOD BLOOD RIGHT HAND  Final   Special Requests   Final    BOTTLES DRAWN AEROBIC AND ANAEROBIC Blood Culture adequate volume   Culture   Final    NO GROWTH 3 DAYS Performed at [facility], [address] [place], [place] [address]    Report Status PENDING  Incomplete  Blood Culture (routine x 2)     Status: None (Preliminary result)   Collection Time: 08/29/21 11:53 AM   Specimen: BLOOD  Result Value Ref Range Status   Specimen Description BLOOD BLOOD LEFT FOREARM  Final   Special Requests   Final    BOTTLES DRAWN AEROBIC AND ANAEROBIC Blood Culture adequate  volume   Culture   Final    NO GROWTH 3 DAYS Performed at [facility], [address], [place], [place] [address]    Report Status PENDING  Incomplete  Urine Culture     Status: Abnormal   Collection Time: 08/29/21 11:53 AM   Specimen: Urine, Catheterized  Result Value Ref Range Status   Specimen Description   Final    URINE, CATHETERIZED Performed at [facility], [address] [place], [place] [address]    Special Requests   Final    NONE Performed at [facility], [address] [place], [place] [address]    Culture MULTIPLE SPECIES PRESENT, SUGGEST RECOLLECTION (A)  Final   Report Status 08/30/2021 FINAL  Final  Resp Panel by RT-PCR (Flu A&B, Covid) Anterior Nasal Swab     Status: None   Collection Time: 08/29/21  4:50 PM   Specimen: Anterior Nasal Swab  Result Value Ref Range Status   SARS Coronavirus 2 by RT PCR NEGATIVE NEGATIVE Final    Comment: (NOTE) SARS-CoV-2 target nucleic acids are NOT DETECTED.  The SARS-CoV-2 RNA is generally detectable in upper respiratory specimens during the acute phase of infection. The lowest concentration of SARS-CoV-2 viral copies this assay can detect is 138 copies/mL. A negative result does not preclude SARS-Cov-2 infection and should not be used as the sole basis for treatment or other patient management decisions. A negative result may occur with  improper  specimen collection/handling, submission of specimen other than nasopharyngeal swab, presence of viral mutation(s) within the areas targeted by this assay, and inadequate number of viral copies(<138 copies/mL). A negative result must be combined with clinical observations, patient history, and epidemiological information. The expected result is Negative.  Fact Sheet for Patients:  EntrepreneurPulse.com.au  Fact Sheet for Healthcare Providers:  IncredibleEmployment.be  This test is no t yet approved or cleared by the [place] FDA and  has been authorized for detection and/or diagnosis of SARS-CoV-2 by FDA under an Emergency Use Authorization (EUA). This EUA will remain  in effect (meaning this test can be used) for the duration of the COVID-19 declaration under Section 564(b)(1) of the Act, 21 U.S.C.section 360bbb-3(b)(1), unless the authorization is terminated  or revoked sooner.       Influenza A by PCR NEGATIVE NEGATIVE Final   Influenza B by PCR NEGATIVE NEGATIVE Final    Comment: (NOTE) The Xpert Xpress SARS-CoV-2/FLU/RSV plus assay is intended as an aid in the diagnosis of influenza from Nasopharyngeal swab specimens and should not be used as a sole basis for treatment. Nasal washings and aspirates are unacceptable for Xpert Xpress SARS-CoV-2/FLU/RSV testing.  Fact Sheet for Patients: EntrepreneurPulse.com.au  Fact Sheet for Healthcare Providers: IncredibleEmployment.be  This test is not yet approved or cleared by the [place] FDA and has been authorized for detection and/or diagnosis of SARS-CoV-2 by FDA under an Emergency Use Authorization (EUA). This EUA will remain in effect (meaning this test can be used) for the duration of the COVID-19 declaration under Section 564(b)(1) of the Act, 21 U.S.C. section 360bbb-3(b)(1), unless the authorization is terminated or revoked.  Performed at  [facility], [address], [place], [place] [address]   MRSA Next Gen by PCR, Nasal     Status: Abnormal   Collection Time: 08/29/21  6:36 PM   Specimen: Nasal Mucosa; Nasal Swab  Result Value Ref Range Status   MRSA by PCR Next Gen DETECTED (A) NOT DETECTED Final    Comment: RESULT CALLED TO,  READ BACK BY AND VERIFIED WITH: RN DCharna Busman [phone] '@[id number]'$  FH (NOTE) The GeneXpert MRSA Assay (FDA approved for NASAL specimens only), is one component of a comprehensive MRSA colonization surveillance program. It is not intended to diagnose MRSA infection nor to guide or monitor treatment for MRSA infections. Test performance is not FDA approved in patients less than 9 years old. Performed at [facility], [place] [address] [place], [place] [address]      Radiology Studies: MR ANGIO HEAD WO CONTRAST  Result Date: 08/31/2021 CLINICAL DATA:  Intraparenchymal hemorrhage involving the posterior right hippocampus. EXAM: MRA HEAD WITHOUT CONTRAST TECHNIQUE: Angiographic images of the Circle of Willis were acquired using MRA technique without intravenous contrast. COMPARISON:  None Available. FINDINGS: Anterior circulation: Atherosclerotic irregularity is present within the cavernous internal carotid arteries bilaterally. The high cervical segments are within normal limits. Moderate narrowing is present the right cavernous ICA. The supraclinoid internal carotid arteries are within normal limits bilaterally. Moderate stenosis is present the distal right A1 segment. The left A1 segment is normal. M1 segments are normal bilaterally. MCA bifurcations are intact. No vascular malformation is evident. Posterior circulation: Vertebral arteries are codominant. PICA origins are visualized and within normal limits. The vertebrobasilar junction and basilar artery is normal. Both posterior cerebral arteries originate basilar tip. Mild narrowing is present in the proximal left P2 segment and more distal right P2  segment without a significant stenosis on either side. PCA branch vessels are within normal limits. Anatomic variants: None Other: None. IMPRESSION: 1. Moderate narrowing of the right cavernous ICA. 2. Moderate stenosis of the distal right A1 segment. 3. Mild narrowing of the proximal left P2 segment and more distal right P2 segment without a significant stenosis on either side. 4. No focal lesion or vascular malformation to explain the hemorrhage. Electronically Signed   By: San Morelle M.D.   On: 08/31/2021 12:54   ECHOCARDIOGRAM COMPLETE  Result Date: 08/30/2021    ECHOCARDIOGRAM REPORT   Patient Name:   RABAB CURRINGTON Szabo Date of Exam: 08/30/2021 Medical Rec #:  [id number]     Height:       66.0 in Accession #:    [id number]    Weight:       222.7 lb Date of Birth:  1950-11-20      BSA:          2.093 m Patient Age:    58 years      BP:           124/90 mmHg Patient Gender: F             HR:           110 bpm. Exam Location:  Inpatient Procedure: 2D Echo, Cardiac Doppler and Color Doppler Indications:    Stroke  History:        Patient has prior history of Echocardiogram examinations, most                 recent 08/26/2020. Arrythmias:Tachycardia,                 Signs/Symptoms:Shortness of Breath and Altered Mental Status;                 Risk Factors:Morbid obesity.  Sonographer:    Merrie Roof RDCS Referring Phys: [id number] ASHISH ARORA IMPRESSIONS  1. Left ventricular ejection fraction, by estimation, is >75%. The left ventricle has hyperdynamic function. The left ventricle has no regional wall motion abnormalities. There is mild  concentric left ventricular hypertrophy. Left ventricular diastolic parameters are consistent with Grade I diastolic dysfunction (impaired relaxation).  2. Right ventricular systolic function is hyperdynamic. The right ventricular size is normal. Tricuspid regurgitation signal is inadequate for assessing PA pressure.  3. Left atrial size was severely dilated.  4. The mitral valve  is normal in structure. No evidence of mitral valve regurgitation. No evidence of mitral stenosis.  5. The aortic valve is tricuspid. There is mild calcification of the aortic valve. There is mild thickening of the aortic valve. Aortic valve regurgitation is not visualized. Aortic valve sclerosis/calcification is present, without any evidence of aortic stenosis.  6. The inferior vena cava is collapsed, consistent with low right atrial pressures. Conclusion(s)/Recommendation(s): The study suggests hypovolemia. FINDINGS  Left Ventricle: There is mid-cavity obliteration with an intracavitary "gradient" of 3.8 m/s. Left ventricular ejection fraction, by estimation, is >75%. The left ventricle has hyperdynamic function. The left ventricle has no regional wall motion abnormalities. The left ventricular internal cavity size was normal in size. There is mild concentric left ventricular hypertrophy. Left ventricular diastolic parameters are consistent with Grade I diastolic dysfunction (impaired relaxation). Indeterminate filling pressures. Right Ventricle: The right ventricular size is normal. No increase in right ventricular wall thickness. Right ventricular systolic function is hyperdynamic. Tricuspid regurgitation signal is inadequate for assessing PA pressure. Left Atrium: Left atrial size was severely dilated. Right Atrium: Right atrial size was normal in size. Pericardium: There is no evidence of pericardial effusion. Mitral Valve: The mitral valve is normal in structure. Mild mitral annular calcification. No evidence of mitral valve regurgitation. No evidence of mitral valve stenosis. Tricuspid Valve: The tricuspid valve is grossly normal. Tricuspid valve regurgitation is not demonstrated. Aortic Valve: Gradients are exaggerated by the hyperdynamic left ventricular contractility. The aortic valve is tricuspid. There is mild calcification of the aortic valve. There is mild thickening of the aortic valve. Aortic valve  regurgitation is not visualized. Aortic valve sclerosis/calcification is present, without any evidence of aortic stenosis. Aortic valve mean gradient measures 11.4 mmHg. Aortic valve peak gradient measures 24.3 mmHg. Aortic valve area, by VTI measures 2.03 cm. Pulmonic Valve: The pulmonic valve was grossly normal. Pulmonic valve regurgitation is not visualized. Aorta: The aortic root and ascending aorta are structurally normal, with no evidence of dilitation. Venous  IAS/Shunts: The interatrial septum was not well visualized.  LEFT VENTRICLE PLAX 2D LVIDd:         4.40 cm   Diastology LVIDs:         2.50 cm   LV e' medial:    5.44 cm/s LV PW:         1.20 cm   LV E/e' medial:  14.4 LV IVS:        1.20 cm   LV e' lateral:   8.55 cm/s LVOT diam:     2.20 cm   LV E/e' lateral: 9.2 LV SV:         70 LV SV Index:   33 LVOT Area:     3.80 cm  RIGHT VENTRICLE RV Basal diam:  2.90 cm LEFT ATRIUM              Index        RIGHT ATRIUM           Index LA diam:        5.00 cm  2.39 cm/m   RA Area:     10.80 cm LA Vol (A2C):   65.2 ml  31.15  ml/m  RA Volume:   20.10 ml  9.60 ml/m LA Vol (A4C):   113.0 ml 53.98 ml/m LA Biplane Vol: 85.9 ml  41.04 ml/m  AORTIC VALVE AV Area (Vmax):    2.05 cm AV Area (Vmean):   2.19 cm AV Area (VTI):     2.03 cm AV Vmax:           246.50 cm/s AV Vmean:          151.912 cm/s AV VTI:            0.345 m AV Peak Grad:      24.3 mmHg AV Mean Grad:      11.4 mmHg LVOT Vmax:         133.00 cm/s LVOT Vmean:        87.400 cm/s LVOT VTI:          0.184 m LVOT/AV VTI ratio: 0.53  AORTA Ao Root diam: 3.20 cm Ao Asc diam:  2.70 cm MITRAL VALVE MV Area (PHT): 4.80 cm     SHUNTS MV Decel Time: 158 msec     Systemic VTI:  0.18 m MV E velocity: 78.60 cm/s   Systemic Diam: 2.20 cm MV A velocity: 134.00 cm/s MV E/A ratio:  0.59 Mihai Croitoru MD Electronically signed by Sanda Klein MD Signature Date/Time: 08/30/2021/6:06:37 PM    Final     Scheduled Meds:  Chlorhexidine Gluconate Cloth  6 each  Topical Daily   [START ON 09/02/2021] ferrous sulfate  325 mg Oral Q breakfast   heparin injection (subcutaneous)  5,000 Units Subcutaneous Q8H   hydrALAZINE  50 mg Oral Q8H   insulin aspart  0-24 Units Subcutaneous Q4H   mupirocin ointment  1 Application Nasal BID   pantoprazole (PROTONIX) IV  40 mg Intravenous QHS   senna-docusate  1 tablet Oral BID   verapamil  180 mg Oral Daily   Continuous Infusions:  sodium chloride 75 mL/hr at 09/01/21 1200   magnesium sulfate bolus IVPB 4 g (09/01/21 1248)     LOS: 3 days   Darliss Cheney, MD [facility]  09/01/2021, 2:29 PM   *Please note that this is a verbal dictation therefore any spelling or grammatical errors are due to the "[place] [employer]" system interpretation.  Please page via [place] and do not message via secure chat for urgent patient care matters. Secure chat can be used for non urgent patient care matters.  How to contact the [facility] Attending or Consulting provider [place] or covering provider during after hours [place], for this patient?  Check the care team in [facility] and look for a) attending/consulting [facility] provider listed and b) the [facility] team listed. Page or secure chat [phone]. Log into [email or web address] and use [place]'s universal password to access. If you do not have the password, please contact the hospital operator. Locate the [facility] provider you are looking for under [facility] and page to a number that you can be directly reached. If you still have difficulty reaching the provider, please page the [facility] (Director on Call) for the Hospitalists listed on amion for assistance.

## 2021-09-02 DIAGNOSIS — Z7901 Long term (current) use of anticoagulants: Secondary | ICD-10-CM | POA: Diagnosis not present

## 2021-09-02 DIAGNOSIS — I619 Nontraumatic intracerebral hemorrhage, unspecified: Secondary | ICD-10-CM | POA: Diagnosis not present

## 2021-09-02 DIAGNOSIS — I610 Nontraumatic intracerebral hemorrhage in hemisphere, subcortical: Secondary | ICD-10-CM | POA: Diagnosis not present

## 2021-09-02 LAB — CBC
HCT: 37.5 % (ref 36.0–46.0)
Hemoglobin: 11.2 g/dL — ABNORMAL LOW (ref 12.0–15.0)
MCH: 25.4 pg — ABNORMAL LOW (ref 26.0–34.0)
MCHC: 29.9 g/dL — ABNORMAL LOW (ref 30.0–36.0)
MCV: 85 fL (ref 80.0–100.0)
Platelets: 211 10*3/uL (ref 150–400)
RBC: 4.41 MIL/uL (ref 3.87–5.11)
RDW: 15.7 % — ABNORMAL HIGH (ref 11.5–15.5)
WBC: 11.5 10*3/uL — ABNORMAL HIGH (ref 4.0–10.5)
nRBC: 0 % (ref 0.0–0.2)

## 2021-09-02 LAB — GLUCOSE, CAPILLARY
Glucose-Capillary: 127 mg/dL — ABNORMAL HIGH (ref 70–99)
Glucose-Capillary: 124 mg/dL — ABNORMAL HIGH (ref 70–99)
Glucose-Capillary: 102 mg/dL — ABNORMAL HIGH (ref 70–99)
Glucose-Capillary: 146 mg/dL — ABNORMAL HIGH (ref 70–99)
Glucose-Capillary: 136 mg/dL — ABNORMAL HIGH (ref 70–99)
Glucose-Capillary: 109 mg/dL — ABNORMAL HIGH (ref 70–99)

## 2021-09-02 LAB — BASIC METABOLIC PANEL
Anion gap: 9 (ref 5–15)
BUN: 18 mg/dL (ref 8–23)
CO2: 20 mmol/L — ABNORMAL LOW (ref 22–32)
Calcium: 7.6 mg/dL — ABNORMAL LOW (ref 8.9–10.3)
Chloride: 113 mmol/L — ABNORMAL HIGH (ref 98–111)
Creatinine, Ser: 1.4 mg/dL — ABNORMAL HIGH (ref 0.44–1.00)
GFR, Estimated: 40 mL/min — ABNORMAL LOW (ref >60)
Glucose, Bld: 112 mg/dL — ABNORMAL HIGH (ref 70–99)
Potassium: 2.7 mmol/L — CL (ref 3.5–5.1)
Sodium: 142 mmol/L (ref 135–145)

## 2021-09-02 LAB — POTASSIUM: Potassium: 3.5 mmol/L (ref 3.5–5.1)

## 2021-09-02 MED ORDER — POTASSIUM CHLORIDE CRYS ER 20 MEQ PO TBCR
20.0000 meq | EXTENDED_RELEASE_TABLET | Freq: Once | ORAL | Status: AC
  Administered 2021-09-02: 20 meq via ORAL
  Filled 2021-09-02: qty 1

## 2021-09-02 MED ORDER — POTASSIUM CHLORIDE 10 MEQ/100ML IV SOLN
10.0000 meq | INTRAVENOUS | Status: AC
  Administered 2021-09-02 (×6): 10 meq via INTRAVENOUS
  Filled 2021-09-02: qty 100

## 2021-09-02 MED ORDER — POTASSIUM CHLORIDE CRYS ER 20 MEQ PO TBCR
40.0000 meq | EXTENDED_RELEASE_TABLET | Freq: Once | ORAL | Status: AC
Start: 2021-09-02 — End: 2021-09-02
  Administered 2021-09-02: 40 meq via ORAL
  Filled 2021-09-02: qty 2

## 2021-09-02 MED ORDER — POTASSIUM CHLORIDE 10 MEQ/100ML IV SOLN
10.0000 meq | INTRAVENOUS | Status: DC
Start: 2021-09-02 — End: 2021-09-02

## 2021-09-02 NOTE — Progress Notes (Addendum)
STROKE TEAM PROGRESS NOTE   INTERVAL HISTORY Family at bedside. Patient is awake and alert in bed in NAD. She is awake, and alert, interactive with [place]. Exam is similar to yesterday. No new neurological events overnight. SCr is down to 1.4, K is 2.7 getting replaced. WBC is down to 11.5. A febrile overnight   Vitals:   09/02/21 0400 09/02/21 0500 09/02/21 0600 09/02/21 0700  BP: 120/83 134/69 (!) 145/53 (!) 150/64  Pulse: 84 79 73 75  Resp: 18 18 (!) 22 (!) 21  Temp: 98 F (36.7 C)     TempSrc: Oral     SpO2: 100% 100% 100% 100%  Weight:      Height:       CBC:  Recent Labs  Lab 08/29/21 1153 08/31/21 0119 09/01/21 0252 09/02/21 0503  WBC 11.3*   < > 15.7* 11.5*  NEUTROABS 10.3*  --   --   --   HGB 11.5*   < > 11.0* 11.2*  HCT 38.7   < > 37.1 37.5  MCV 87.4   < > 87.9 85.0  PLT 327   < > 265 211   < > = values in this interval not displayed.    Basic Metabolic Panel:  Recent Labs  Lab 09/01/21 0252 09/01/21 1534 09/02/21 0503  NA 148* 146* 142  K 3.1* 3.2* 2.7*  CL 110 110 113*  CO2 22 21* 20*  GLUCOSE 92 105* 112*  BUN '22 19 18  '$ CREATININE 1.62* 1.53* 1.40*  CALCIUM 8.0* 7.8* 7.6*  MG 1.1* 1.9  --     Lipid Panel:  Recent Labs  Lab 08/29/21 1912  CHOL 112  TRIG 69  HDL 58  CHOLHDL 1.9  VLDL 14  LDLCALC 40    HgbA1c:  Recent Labs  Lab 08/29/21 1912  HGBA1C 5.6    Urine Drug Screen: No results for input(s): "LABOPIA", "COCAINSCRNUR", "LABBENZ", "AMPHETMU", "THCU", "LABBARB" in the last 168 hours.  Alcohol Level No results for input(s): "ETH" in the last 168 hours.  IMAGING past 24 hours No results found.  PHYSICAL EXAM She is able to state her name, age as "71", able to state place. Able to identify her brother in law with correct name.Not able to name objects. Able to repeat 3 word sentences. She has a right gaze does not cross midline, no blink to threat on left. RN states she can intermittently cross midline. She is able to follow simple  commands. Has tremors on right hand. Left arm antigravity. Left side neglect. Withdraws to painful stimuli.  ASSESSMENT/PLAN [name] is a 71 y.o. 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 CT findings of the ICH, she was given Andexxa for reversal  ICH: Acute Right hippocampus ICH, likely due to hypertension in the setting of eliquis use CT head Region of hemorrhage within the hippocampus on the right, probably representing a hemorrhagic infarction.  Repeat CT head stable hemorrhage of the right hippocampus, 6-7 mL. Questionable trace intraventricular extension of blood is stable with no ventriculomegaly. MRI  No significant interval change in intraparenchymal hemorrhage centered at the posterior right hippocampal formation. Trace layering blood within the occipital horn of the left lateral ventricle. Multiple additional scattered chronic micro hemorrhages clustered about the cerebellum, brainstem, and thalami, most characteristic of chronic poorly controlled hypertension. EEG moderate diffuse encephalopathy 2D Echo EF >75%  LDL 40 HgbA1c 5.6 VTE prophylaxis - Heparin SQ aspirin 81 mg daily and Eliquis BID  prior to admission, now on No antithrombotic.  Therapy recommendations: Back to SNF Disposition:  pending  Hx of DVT/PE on eliquis S/p Andexxa reversal CTA chest no PE AC on hold Not sure the duration of AC, if still needed, may consider to resume AC once ICH resolved on CT in 3-4 weeks.   Hx of stroke 03/2015 TIA 07/2019 admitted for speech difficulty, dysphagia, MRI showed bilateral infarcts.  Xarelto changed to Eliquis 06/2020 admitted for right-sided weakness and word finding difficulty.  MRI negative for acute infarct, but old left frontal infarct, left BG/CR, thalamus and pontine infarcts.  MRI negative. 08/2020  admitted for decreased talking.  CT negative.  EEG showed encephalopathy.  Hypertensive emergency, resolved Home meds:  lisinopril, verapamil Stable BP goal < 160 Off cleviprex gtt now Long-term BP goal normotensive  Hyperlipidemia Home meds:  lipitor 40 LDL 40, goal < 70 Can continue statin at discharge  Diabetes type II Controlled Home meds:  metformin HgbA1c 5.6, goal < 7.0 Monitor CBGs SSI Close PCP follow up as outpt  Fever Leukocytosis Tachycardia Leukocytosis WBC 11.3->15.6->15.7->11.5 Tmax 101-100.8-> afebrile Tachycardia, resolved CXR negative.  UA negative. Blood cultures NGTD Etiology not quite clear  AKI on CKD 3a creatinine 1.14-> 1.46-1.53-1.62-1.53->1.4 on IV fluid Encourage p.o. intake  Dysphagia [place] swallow now On diet Speech on board  Other Stroke Risk Factors Advanced Age >/= 71  Obesity, Body mass index is 35.94 kg/m., BMI >/= 30 associated with increased stroke risk, recommend weight loss, diet and exercise as appropriate   Other Active Problems AKI on CKD  IDA Hypokalemia K 2.6->2.8->3.1->3.2->2.7, magnesium 1.1->1.9, continue BMP monitoring  Neurology will sign off. Please call with questions or concerns   Hospital day # [place] DNP, ACNPC-AG   ATTENDING NOTE: I reviewed above note and agree with the assessment and plan. Pt was seen and examined.   Brother-in-law at bedside.  Patient neuro stable, unchanged no acute event overnight.  Leukocytosis and AKI improved.  Passed swallow on diet, gradually taper off of IV fluid.  PT/OT recommend SNF.  Still has hypokalemia, received supplement.  For detailed assessment and plan, please refer to above/below as I have made changes wherever appropriate.   Neurology will sign off. Please call with questions. Pt will follow up with Dr. April Manson at [facility] on 11/14/21. Thanks for the consult.   [name] Hawking, [name] Stroke Neurology 09/02/2021 6:48 PM     To contact Stroke Continuity  provider, please refer to [email or web address] After hours, contact General Neurology

## 2021-09-02 NOTE — Progress Notes (Signed)
PROGRESS NOTE    MISHAAL [name]  JJH:[id number] DOB: 07-Dec-1950 DOA: 08/29/2021 PCP: Caprice Renshaw, MD   Brief Narrative:  [name] is a 71 y.o. female past medical history of PE on anticoagulation with Eliquis, diabetes, hypertension, prior strokes with unclear residual deficits, panniculitis and cellulitis, with an unclear last known well-possibly few days because she has not been eating much, brought for evaluation at [facility] and a CT head done revealed a right hippocampal hemorrhage with no IVH.  Extensive small vessel ischemic disease.  CT angio chest negative for PE. Transferred to [facility] for further management. She was evaluated in the past for some episodes of expressive aphasia without evidence of stroke on MRI.  EEG in July 2022 moderate to severe diffuse encephalopathy nonspecific etiology. Outpatient follow-up with Dr. April Manson in September 2022 for the episodes of slurred speech/aphasia x2 reports patient chronically wheelchair-bound with barely antigravity in lower extremity strength and 4/5 bilateral upper extremity strength, poor recall but at baseline awake and oriented to person.   After the CT findings of the ICH, she was given Andexxa for reversal of Eliquis. Cleviprex started to control blood pressure she was hypertensive on arrival.  She was transferred to [facility] under neurology.  Subsequently transferred under [facility] [place] on 09/01/2021.  Assessment & Plan:   Principal Problem:   Hemorrhagic stroke [facility]) Active Problems:   ICH (intracerebral hemorrhage) ([place])  ICH/acute right hippocampus ICH: Likely due to hypertension in the setting of Eliquis use: CT head Region of hemorrhage within the hippocampus on the right, probably representing a hemorrhagic infarction. Repeat CT head stable hemorrhage of the right hippocampus, 6-7 mL. Questionable trace intraventricular extension of blood is stable with no ventriculomegaly.MRI  No significant interval change in  intraparenchymal hemorrhage centered at the posterior right hippocampal formation. Trace layering blood within the occipital horn of the left lateral ventricle. Multiple additional scattered chronic micro hemorrhages clustered about the cerebellum, brainstem, and thalami, most characteristic of chronic poorly controlled hypertension.EEG moderate diffuse encephalopathy. aspirin 81 mg daily and Eliquis BID  prior to admission, now on No antithrombotic.  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  Per neuro note "Not sure the duration of AC, if still needed, may consider to resume AC once ICH resolved on CT in 3-4 weeks"  Hx of stroke 03/2015 TIA 07/2019 admitted for speech difficulty, dysphagia, MRI showed bilateral infarcts.  Xarelto changed to Eliquis 06/2020 admitted for right-sided weakness and word finding difficulty.  MRI negative for acute infarct, but old left frontal infarct, left BG/CR, thalamus and pontine infarcts.  MRI negative. 08/2020 admitted for decreased talking.  CT negative.  EEG showed encephalopathy.  Hypertensive urgency: Blood pressure was elevated yesterday morning, resumed her verapamil, added hydralazine.  Blood pressure now better.  Fever/tachycardia/leukocytosis: Reportedly she had fever and tachycardia and she also has leukocytosis.  All infectious work-up was negative.  Blood culture remain negative as well.  She was managed off of antibiotics and observed, she has not had any fever in the last 24 hours at least that I can see.  Dysphagia: Resolved.  She is now on regular diet.  Speech was on board.  Hypomagnesemia: Resolved  Severe hypokalemia: 2.7.  Will replace.  Hypernatremia: Resolved.  DVT prophylaxis: heparin injection 5,000 Units Start: 08/30/21 1400 SCD's Start: 08/29/21 1848   Code Status: Full Code  Family Communication: Brother-in-law present at bedside.  Plan of care  discussed with patient  in length and he/she verbalized understanding and agreed with it.  Status is: Inpatient Remains inpatient appropriate because: Medically cleared, pending placement to SNF.   Estimated body mass index is 35.94 kg/m as calculated from the following:   Height as of this encounter: '5\' 6"'$  (1.676 m).   Weight as of this encounter: 101 kg.  Pressure Injury 08/25/20 Buttocks Left;Medial Stage 2 -  Partial thickness loss of dermis presenting as a shallow open injury with a red, pink wound bed without slough. (Active)  08/25/20 2100  Location: Buttocks  Location Orientation: Left;Medial  Staging: Stage 2 -  Partial thickness loss of dermis presenting as a shallow open injury with a red, pink wound bed without slough.  Wound Description (Comments):   Present on Admission:      Pressure Injury 08/25/20 Coccyx Mid Stage 2 -  Partial thickness loss of dermis presenting as a shallow open injury with a red, pink wound bed without slough. (Active)  08/25/20 2100  Location: Coccyx  Location Orientation: Mid  Staging: Stage 2 -  Partial thickness loss of dermis presenting as a shallow open injury with a red, pink wound bed without slough.  Wound Description (Comments):   Present on Admission: Yes   Nutritional Assessment: Body mass index is 35.94 kg/m.Marland Kitchen Seen by dietician.  I agree with the assessment and plan as outlined below: Nutrition Status:        . Skin Assessment: I have examined the patient's skin and I agree with the wound assessment as performed by the wound care RN as outlined below: Pressure Injury 08/25/20 Buttocks Left;Medial Stage 2 -  Partial thickness loss of dermis presenting as a shallow open injury with a red, pink wound bed without slough. (Active)  08/25/20 2100  Location: Buttocks  Location Orientation: Left;Medial  Staging: Stage 2 -  Partial thickness loss of dermis presenting as a shallow open injury with a red, pink wound bed without slough.  Wound Description  (Comments):   Present on Admission:      Pressure Injury 08/25/20 Coccyx Mid Stage 2 -  Partial thickness loss of dermis presenting as a shallow open injury with a red, pink wound bed without slough. (Active)  08/25/20 2100  Location: Coccyx  Location Orientation: Mid  Staging: Stage 2 -  Partial thickness loss of dermis presenting as a shallow open injury with a red, pink wound bed without slough.  Wound Description (Comments):   Present on Admission: Yes    Consultants:  Neurology  Procedures:  None  Antimicrobials:  Anti-infectives (From admission, onward)    Start     Dose/Rate Route Frequency Ordered Stop   08/29/21 1200  cefTRIAXone (ROCEPHIN) 2 g in sodium chloride 0.9 % 100 mL IVPB        2 g 200 mL/hr over 30 Minutes Intravenous  Once 08/29/21 1153 08/29/21 1330         Subjective: Patient seen and examined.  She has no complaints.  She is fully alert and oriented.  Brother-in-law at the bedside.  Objective: Vitals:   09/02/21 0900 09/02/21 1000 09/02/21 1100 09/02/21 1139  BP: (!) 159/89 (!) 164/78 (!) 159/95 106/68  Pulse: 74 81 84 85  Resp: 18 19 (!) 29 18  Temp:    98.2 F (36.8 C)  TempSrc:      SpO2: 100% 100% 99% 100%  Weight:      Height:        Intake/Output Summary (Last 24  hours) at 09/02/2021 1349 Last data filed at 09/02/2021 1107 Gross per 24 hour  Intake 1703.04 ml  Output 350 ml  Net 1353.04 ml    Filed Weights   08/29/21 1137  Weight: 101 kg    Examination:  General exam: Appears calm and comfortable  Respiratory system: Clear to auscultation. Respiratory effort normal. Cardiovascular system: S1 & S2 heard, RRR. No JVD, murmurs, rubs, gallops or clicks. No pedal edema. Gastrointestinal system: Abdomen is nondistended, soft and nontender. No organomegaly or masses felt. Normal bowel sounds heard. Central nervous system: Alert and oriented.  Has global weakness in all 4 extremities but slightly more on the left arm than the  right. Extremities: Symmetric 5 x 5 power. Skin: No rashes, lesions or ulcers.  Psychiatry: Judgement and insight appear poor  Data Reviewed: I have personally reviewed following labs and imaging studies  CBC: Recent Labs  Lab 08/29/21 1153 08/31/21 0119 09/01/21 0252 09/02/21 0503  WBC 11.3* 15.6* 15.7* 11.5*  NEUTROABS 10.3*  --   --   --   HGB 11.5* 10.8* 11.0* 11.2*  HCT 38.7 35.8* 37.1 37.5  MCV 87.4 86.7 87.9 85.0  PLT 327 253 265 267    Basic Metabolic Panel: Recent Labs  Lab 08/31/21 0119 08/31/21 1610 09/01/21 0252 09/01/21 1534 09/02/21 0503  NA 145 146* 148* 146* 142  K 2.6* 2.8* 3.1* 3.2* 2.7*  CL 111 111 110 110 113*  CO2 '23 22 22 '$ 21* 20*  GLUCOSE 89 113* 92 105* 112*  BUN '21 19 22 19 18  '$ CREATININE 1.46* 1.53* 1.62* 1.53* 1.40*  CALCIUM 7.5* 7.8* 8.0* 7.8* 7.6*  MG  --   --  1.1* 1.9  --     GFR: Estimated Creatinine Clearance: 44.2 mL/min (A) (by C-G formula based on SCr of 1.4 mg/dL (H)). Liver Function Tests: Recent Labs  Lab 08/29/21 1200  AST 11*  ALT 10  ALKPHOS 65  BILITOT 0.9  PROT 7.3  ALBUMIN 3.0*    No results for input(s): "LIPASE", "AMYLASE" in the last 168 hours. No results for input(s): "AMMONIA" in the last 168 hours. Coagulation Profile: Recent Labs  Lab 08/29/21 1153  INR 1.4*    Cardiac Enzymes: No results for input(s): "CKTOTAL", "CKMB", "CKMBINDEX", "TROPONINI" in the last 168 hours. BNP (last 3 results) No results for input(s): "PROBNP" in the last 8760 hours. HbA1C: No results for input(s): "HGBA1C" in the last 72 hours.  CBG: Recent Labs  Lab 09/01/21 1923 09/01/21 2324 09/02/21 0313 09/02/21 0800 09/02/21 1137  GLUCAP 155* 155* 127* 124* 102*    Lipid Profile: No results for input(s): "CHOL", "HDL", "LDLCALC", "TRIG", "CHOLHDL", "LDLDIRECT" in the last 72 hours.  Thyroid Function Tests: No results for input(s): "TSH", "T4TOTAL", "FREET4", "T3FREE", "THYROIDAB" in the last 72 hours. Anemia  Panel: No results for input(s): "VITAMINB12", "FOLATE", "FERRITIN", "TIBC", "IRON", "RETICCTPCT" in the last 72 hours. Sepsis Labs: Recent Labs  Lab 08/29/21 1153 08/29/21 1355  LATICACIDVEN 1.2 1.1     Recent Results (from the past 240 hour(s))  Blood Culture (routine x 2)     Status: None (Preliminary result)   Collection Time: 08/29/21 11:50 AM   Specimen: BLOOD  Result Value Ref Range Status   Specimen Description BLOOD BLOOD RIGHT HAND  Final   Special Requests   Final    BOTTLES DRAWN AEROBIC AND ANAEROBIC Blood Culture adequate volume   Culture   Final    NO GROWTH 4 DAYS Performed at [facility], [address] [place], [place] [address]    Report Status PENDING  Incomplete  Blood Culture (routine x 2)     Status: None (Preliminary result)   Collection Time: 08/29/21 11:53 AM   Specimen: BLOOD  Result Value Ref Range Status   Specimen Description BLOOD BLOOD LEFT FOREARM  Final   Special Requests   Final    BOTTLES DRAWN AEROBIC AND ANAEROBIC Blood Culture adequate volume   Culture   Final    NO GROWTH 4 DAYS Performed at [facility], [address] [place], [place] [address]    Report Status PENDING  Incomplete  Urine Culture     Status: Abnormal   Collection Time: 08/29/21 11:53 AM   Specimen: Urine, Catheterized  Result Value Ref Range Status   Specimen Description   Final    URINE, CATHETERIZED Performed at [facility], [address], [place], [place] [address]    Special Requests   Final    NONE Performed at [facility], [address], [place], [place] [address]    Culture MULTIPLE SPECIES PRESENT, SUGGEST RECOLLECTION (A)  Final   Report Status 08/30/2021 FINAL  Final  Resp Panel by RT-PCR (Flu A&B, Covid) Anterior Nasal Swab     Status: None   Collection Time: 08/29/21  4:50 PM   Specimen: Anterior Nasal Swab  Result Value Ref Range Status   SARS Coronavirus 2 by RT PCR NEGATIVE NEGATIVE Final    Comment: (NOTE) SARS-CoV-2 target nucleic  acids are NOT DETECTED.  The SARS-CoV-2 RNA is generally detectable in upper respiratory specimens during the acute phase of infection. The lowest concentration of SARS-CoV-2 viral copies this assay can detect is 138 copies/mL. A negative result does not preclude SARS-Cov-2 infection and should not be used as the sole basis for treatment or other patient management decisions. A negative result may occur with  improper specimen collection/handling, submission of specimen other than nasopharyngeal swab, presence of viral mutation(s) within the areas targeted by this assay, and inadequate number of viral copies(<138 copies/mL). A negative result must be combined with clinical observations, patient history, and epidemiological information. The expected result is Negative.  Fact Sheet for Patients:  EntrepreneurPulse.com.au  Fact Sheet for Healthcare Providers:  IncredibleEmployment.be  This test is no t yet approved or cleared by the [place] FDA and  has been authorized for detection and/or diagnosis of SARS-CoV-2 by FDA under an Emergency Use Authorization (EUA). This EUA will remain  in effect (meaning this test can be used) for the duration of the COVID-19 declaration under Section 564(b)(1) of the Act, 21 U.S.C.section 360bbb-3(b)(1), unless the authorization is terminated  or revoked sooner.       Influenza A by PCR NEGATIVE NEGATIVE Final   Influenza B by PCR NEGATIVE NEGATIVE Final    Comment: (NOTE) The Xpert Xpress SARS-CoV-2/FLU/RSV plus assay is intended as an aid in the diagnosis of influenza from Nasopharyngeal swab specimens and should not be used as a sole basis for treatment. Nasal washings and aspirates are unacceptable for Xpert Xpress SARS-CoV-2/FLU/RSV testing.  Fact Sheet for Patients: EntrepreneurPulse.com.au  Fact Sheet for Healthcare Providers: IncredibleEmployment.be  This  test is not yet approved or cleared by the [place] FDA and has been authorized for detection and/or diagnosis of SARS-CoV-2 by FDA under an Emergency Use Authorization (EUA). This EUA will remain in effect (meaning this test can be used) for the duration of the COVID-19 declaration under Section 564(b)(1) of the Act, 21 U.S.C. section  360bbb-3(b)(1), unless the authorization is terminated or revoked.  Performed at [facility], [address] [place], [place] [address]   MRSA Next Gen by PCR, Nasal     Status: Abnormal   Collection Time: 08/29/21  6:36 PM   Specimen: Nasal Mucosa; Nasal Swab  Result Value Ref Range Status   MRSA by PCR Next Gen DETECTED (A) NOT DETECTED Final    Comment: RESULT CALLED TO, READ BACK BY AND VERIFIED WITH: RN DCharna Busman [phone] '@2154'$  FH (NOTE) The GeneXpert MRSA Assay (FDA approved for NASAL specimens only), is one component of a comprehensive MRSA colonization surveillance program. It is not intended to diagnose MRSA infection nor to guide or monitor treatment for MRSA infections. Test performance is not FDA approved in patients less than 18 years old. Performed at [facility], [place] [address], [place], [place] [address]      Radiology Studies: No results found.  Scheduled Meds:  Chlorhexidine Gluconate Cloth  6 each Topical Daily   ferrous sulfate  325 mg Oral Q breakfast   heparin injection (subcutaneous)  5,000 Units Subcutaneous Q8H   hydrALAZINE  50 mg Oral Q8H   insulin aspart  0-24 Units Subcutaneous Q4H   mupirocin ointment  1 Application Nasal BID   pantoprazole (PROTONIX) IV  40 mg Intravenous QHS   senna-docusate  1 tablet Oral BID   verapamil  180 mg Oral Daily   Continuous Infusions:  sodium chloride 75 mL/hr at 09/02/21 1107   potassium chloride 10 mEq (09/02/21 1318)     LOS: 4 days   Darliss Cheney, MD [facility]  09/02/2021, 1:49 PM   *Please note that this is a verbal dictation therefore any  spelling or grammatical errors are due to the "[place] One" system interpretation.  Please page via [place] and do not message via secure chat for urgent patient care matters. Secure chat can be used for non urgent patient care matters.  How to contact the [facility] Attending or Consulting provider [place] or covering provider during after hours [place], for this patient?  Check the care team in [facility] and look for a) attending/consulting TRH provider listed and b) the [facility] team listed. Page or secure chat [phone]. Log into [email or web address] and use [place]'s universal password to access. If you do not have the password, please contact the hospital operator. Locate the [facility] provider you are looking for under [facility] and page to a number that you can be directly reached. If you still have difficulty reaching the provider, please page the [facility] (Director on Call) for the Hospitalists listed on amion for assistance.

## 2021-09-02 NOTE — Progress Notes (Signed)
Date and time results received: 09/02/21 '@0634'$    Critical Value: K 2.7  Name of Provider Notified: Hollace Hayward, NP

## 2021-09-03 DIAGNOSIS — I619 Nontraumatic intracerebral hemorrhage, unspecified: Secondary | ICD-10-CM | POA: Diagnosis not present

## 2021-09-03 LAB — CULTURE, BLOOD (ROUTINE X 2)
Culture: NO GROWTH
Culture: NO GROWTH
Special Requests: ADEQUATE
Special Requests: ADEQUATE

## 2021-09-03 LAB — CBC
HCT: 34.5 % — ABNORMAL LOW (ref 36.0–46.0)
Hemoglobin: 11 g/dL — ABNORMAL LOW (ref 12.0–15.0)
MCH: 26.1 pg (ref 26.0–34.0)
MCHC: 31.9 g/dL (ref 30.0–36.0)
MCV: 81.9 fL (ref 80.0–100.0)
Platelets: 221 10*3/uL (ref 150–400)
RBC: 4.21 MIL/uL (ref 3.87–5.11)
RDW: 15.9 % — ABNORMAL HIGH (ref 11.5–15.5)
WBC: 9.8 10*3/uL (ref 4.0–10.5)
nRBC: 0 % (ref 0.0–0.2)

## 2021-09-03 LAB — BASIC METABOLIC PANEL
Anion gap: 11 (ref 5–15)
BUN: 17 mg/dL (ref 8–23)
CO2: 20 mmol/L — ABNORMAL LOW (ref 22–32)
Calcium: 8 mg/dL — ABNORMAL LOW (ref 8.9–10.3)
Chloride: 111 mmol/L (ref 98–111)
Creatinine, Ser: 1.42 mg/dL — ABNORMAL HIGH (ref 0.44–1.00)
GFR, Estimated: 40 mL/min — ABNORMAL LOW (ref >60)
Glucose, Bld: 131 mg/dL — ABNORMAL HIGH (ref 70–99)
Potassium: 3.2 mmol/L — ABNORMAL LOW (ref 3.5–5.1)
Sodium: 142 mmol/L (ref 135–145)

## 2021-09-03 LAB — GLUCOSE, CAPILLARY
Glucose-Capillary: 136 mg/dL — ABNORMAL HIGH (ref 70–99)
Glucose-Capillary: 131 mg/dL — ABNORMAL HIGH (ref 70–99)
Glucose-Capillary: 213 mg/dL — ABNORMAL HIGH (ref 70–99)
Glucose-Capillary: 137 mg/dL — ABNORMAL HIGH (ref 70–99)
Glucose-Capillary: 144 mg/dL — ABNORMAL HIGH (ref 70–99)

## 2021-09-03 MED ORDER — POTASSIUM CHLORIDE CRYS ER 20 MEQ PO TBCR
40.0000 meq | EXTENDED_RELEASE_TABLET | Freq: Once | ORAL | Status: AC
  Administered 2021-09-03: 40 meq via ORAL
  Filled 2021-09-03: qty 2

## 2021-09-03 NOTE — Progress Notes (Signed)
PROGRESS NOTE    [name]  WJX:[id number] DOB: 07/14/1950 DOA: 08/29/2021 PCP: Caprice Renshaw, MD   Brief Narrative:  71 y.o. 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  Extensive small vessel ischemic disease.  CT angio chest negative for PE. Transferred to [facility] for further management. She was evaluated in the past for some episodes of expressive aphasia without evidence of stroke on MRI.  EEG in July 2022 moderate to severe diffuse encephalopathy nonspecific etiology. Outpatient follow-up with Dr. April Manson in September 2022 for the episodes of slurred speech/aphasia x2 reports patient chronically wheelchair-bound with barely antigravity in lower extremity strength and 4/5 bilateral upper extremity strength, poor recall but at baseline awake and oriented to person.   After the CT findings of the ICH, she was given Andexxa for reversal of Eliquis. Cleviprex started to control blood pressure she was hypertensive on arrival.  She was transferred to [facility] under neurology.  Subsequently transferred under [place] on 09/01/2021.  Assessment and plan:  Intracranial hemorrhage, right hippocampus -Likely in setting of HTN Eliquis use.  Repeat CT head showed stable hemorrhage, MRI did not show any significant interval change either.  EEG showed moderate diffuse encephalopathy.  Prior to admission patient was on aspirin and Eliquis.  This will now be on hold at least next 3-4 weeks thereafter can be reconsidered. -Echo showed EF greater than 75%, LDL 40, A1c 5.6. -PT/OT-SNF  Hypertensive urgency -Improved.  Continue closely monitoring this.    Fever/tachycardia/leukocytosis: -Currently his infectious work-up is negative.  Off antibiotics.  Continue to closely monitor him.    Dysphagia -Resolved on regular diet  Hypokalemia/hypomagnesemia - Replete as needed  Hypernatremia: Resolved.  PT/OT - long term institutional care iwthout follow up with therapy.   DVT prophylaxis: Subcu Hep Code Status: Full Code Family Communication:    Status is: Inpatient Remains inpatient appropriate because: Pending safe dispo planning. TOC aware. Medically stable.       Subjective: Eating her breakfast this morning, doing ok. No complaints.   Examination:  General exam: Appears calm and comfortable  Respiratory system: Clear to auscultation. Respiratory effort normal. Cardiovascular system: S1 & S2 heard, RRR. No JVD, murmurs, rubs, gallops or clicks. No pedal edema. Gastrointestinal system: Abdomen is nondistended, soft and nontender. No organomegaly or masses felt. Normal bowel sounds heard. Central nervous system: Alert and oriented. Left side> right side weakness. Right gaze doesn't cross midline. Left side neglect.  Extremities: Symmetric 5 x 5 power. Skin: No rashes, lesions or ulcers Psychiatry: Judgement and insight appear normal. Mood & affect appropriate.     Objective: Vitals:   09/02/21 1545 09/02/21 2020 09/02/21 2322 09/03/21 0346  BP: (!) 163/75 (!) 161/90 (!) 150/97 (!) 154/93  Pulse: 96 97 95 96  Resp: '16 18 17 17  '$ Temp: 98.4 F (36.9 C) 97.8 F (36.6 C) 98 F (36.7 C) 98.2 F (36.8 C)  TempSrc: Oral Oral  Oral  SpO2: 100% 100% 95%   Weight:      Height:        Intake/Output Summary (Last 24 hours) at 09/03/2021 0836 Last data filed at 09/03/2021 0630 Gross per 24 hour  Intake 560.65 ml  Output 580 ml  Net -19.35 ml   Filed Weights   08/29/21 1137  Weight: 101 kg     Data Reviewed:   CBC: Recent Labs  Lab 08/29/21 1153 08/31/21 0119 09/01/21 0252 09/02/21 0503 09/03/21 0324  WBC  11.3* 15.6* 15.7* 11.5* 9.8  NEUTROABS 10.3*  --   --   --   --   HGB 11.5* 10.8* 11.0* 11.2* 11.0*  HCT 38.7 35.8* 37.1 37.5 34.5*  MCV 87.4 86.7 87.9 85.0 81.9  PLT 327 253 265 211 962   Basic Metabolic Panel: Recent Labs  Lab 08/31/21 1610 09/01/21 0252 09/01/21 1534 09/02/21 0503 09/02/21 1851 09/03/21 0324  NA 146* 148* 146* 142  --  142  K 2.8* 3.1* 3.2* 2.7* 3.5 3.2*  CL 111 110 110 113*  --  111  CO2 22 22 21* 20*  --  20*  GLUCOSE 113* 92 105* 112*  --  131*  BUN '19 22 19 18  '$ --  17  CREATININE 1.53* 1.62* 1.53* 1.40*  --  1.42*  CALCIUM 7.8* 8.0* 7.8* 7.6*  --  8.0*  MG  --  1.1* 1.9  --   --   --    GFR: Estimated Creatinine Clearance: 43.6 mL/min (A) (by C-G formula based on SCr of 1.42 mg/dL (H)). Liver Function Tests: Recent Labs  Lab 08/29/21 1200  AST 11*  ALT 10  ALKPHOS 65  BILITOT 0.9  PROT 7.3  ALBUMIN 3.0*   No results for input(s): "LIPASE", "AMYLASE" in the last 168 hours. No results for input(s): "AMMONIA" in the last 168 hours. Coagulation Profile: Recent Labs  Lab 08/29/21 1153  INR 1.4*   Cardiac Enzymes: No results for input(s): "CKTOTAL", "CKMB", "CKMBINDEX", "TROPONINI" in the last 168 hours. BNP (last 3 results) No results for input(s): "PROBNP" in the last 8760 hours. HbA1C: No results for input(s): "HGBA1C" in the last 72 hours. CBG: Recent Labs  Lab 09/02/21 1555 09/02/21 2018 09/02/21 2320 09/03/21 0348 09/03/21 0814  GLUCAP 146* 136* 109* 136* 131*   Lipid Profile: No results for input(s): "CHOL", "HDL", "LDLCALC", "TRIG", "CHOLHDL", "LDLDIRECT" in the last 72 hours. Thyroid Function Tests: No results for input(s): "TSH", "T4TOTAL", "FREET4", "T3FREE", "THYROIDAB" in the last 72 hours. Anemia Panel: No results for input(s): "VITAMINB12", "FOLATE", "FERRITIN", "TIBC", "IRON", "RETICCTPCT" in the last 72 hours. Sepsis Labs: Recent Labs  Lab 08/29/21 1153 08/29/21 1355  LATICACIDVEN 1.2 1.1    Recent Results  (from the past 240 hour(s))  Blood Culture (routine x 2)     Status: None   Collection Time: 08/29/21 11:50 AM   Specimen: BLOOD  Result Value Ref Range Status   Specimen Description BLOOD BLOOD RIGHT HAND  Final   Special Requests   Final    BOTTLES DRAWN AEROBIC AND ANAEROBIC Blood Culture adequate volume   Culture   Final    NO GROWTH 5 DAYS Performed at [facility], [address], [place], [place] [address]    Report Status 09/03/2021 FINAL  Final  Blood Culture (routine x 2)     Status: None   Collection Time: 08/29/21 11:53 AM   Specimen: BLOOD  Result Value Ref Range Status   Specimen Description BLOOD BLOOD LEFT FOREARM  Final   Special Requests   Final    BOTTLES DRAWN AEROBIC AND ANAEROBIC Blood Culture adequate volume   Culture   Final    NO GROWTH 5 DAYS Performed at [facility], [address] [place], [place] [address]  Report Status 09/03/2021 FINAL  Final  Urine Culture     Status: Abnormal   Collection Time: 08/29/21 11:53 AM   Specimen: Urine, Catheterized  Result Value Ref Range Status   Specimen Description   Final    URINE, CATHETERIZED Performed at [facility], [address], [place], [place] [address]    Special Requests   Final    NONE Performed at [facility] [address] [place], [place] [address]    Culture MULTIPLE SPECIES PRESENT, SUGGEST RECOLLECTION (A)  Final   Report Status 08/30/2021 FINAL  Final  Resp Panel by RT-PCR (Flu A&B, Covid) Anterior Nasal Swab     Status: None   Collection Time: 08/29/21  4:50 PM   Specimen: Anterior Nasal Swab  Result Value Ref Range Status   SARS Coronavirus 2 by RT PCR NEGATIVE NEGATIVE Final    Comment: (NOTE) SARS-CoV-2 target nucleic acids are NOT DETECTED.  The SARS-CoV-2 RNA is generally detectable in upper respiratory specimens during the acute phase of infection. The lowest concentration of SARS-CoV-2 viral copies this assay can detect is 138 copies/mL. A negative result does not  preclude SARS-Cov-2 infection and should not be used as the sole basis for treatment or other patient management decisions. A negative result may occur with  improper specimen collection/handling, submission of specimen other than nasopharyngeal swab, presence of viral mutation(s) within the areas targeted by this assay, and inadequate number of viral copies(<138 copies/mL). A negative result must be combined with clinical observations, patient history, and epidemiological information. The expected result is Negative.  Fact Sheet for Patients:  EntrepreneurPulse.com.au  Fact Sheet for Healthcare Providers:  IncredibleEmployment.be  This test is no t yet approved or cleared by the [place] FDA and  has been authorized for detection and/or diagnosis of SARS-CoV-2 by FDA under an Emergency Use Authorization (EUA). This EUA will remain  in effect (meaning this test can be used) for the duration of the COVID-19 declaration under Section 564(b)(1) of the Act, 21 U.S.C.section 360bbb-3(b)(1), unless the authorization is terminated  or revoked sooner.       Influenza A by PCR NEGATIVE NEGATIVE Final   Influenza B by PCR NEGATIVE NEGATIVE Final    Comment: (NOTE) The Xpert Xpress SARS-CoV-2/FLU/RSV plus assay is intended as an aid in the diagnosis of influenza from Nasopharyngeal swab specimens and should not be used as a sole basis for treatment. Nasal washings and aspirates are unacceptable for Xpert Xpress SARS-CoV-2/FLU/RSV testing.  Fact Sheet for Patients: EntrepreneurPulse.com.au  Fact Sheet for Healthcare Providers: IncredibleEmployment.be  This test is not yet approved or cleared by the [place] FDA and has been authorized for detection and/or diagnosis of SARS-CoV-2 by FDA under an Emergency Use Authorization (EUA). This EUA will remain in effect (meaning this test can be used) for the  duration of the COVID-19 declaration under Section 564(b)(1) of the Act, 21 U.S.C. section 360bbb-3(b)(1), unless the authorization is terminated or revoked.  Performed at [facility], [address], [place], [place] [address]   MRSA Next Gen by PCR, Nasal     Status: Abnormal   Collection Time: 08/29/21  6:36 PM   Specimen: Nasal Mucosa; Nasal Swab  Result Value Ref Range Status   MRSA by PCR Next Gen DETECTED (A) NOT DETECTED Final    Comment: RESULT CALLED TO, READ BACK BY AND VERIFIED WITH: RN DCharna Busman [phone] '@[id number]'$  FH (NOTE) The GeneXpert MRSA Assay (FDA approved for NASAL specimens only), is one component of  a comprehensive MRSA colonization surveillance program. It is not intended to diagnose MRSA infection nor to guide or monitor treatment for MRSA infections. Test performance is not FDA approved in patients less than 61 years old. Performed at [facility], [place] [address], [place], [place] [address]          Radiology Studies: No results found.      Scheduled Meds:  Chlorhexidine Gluconate Cloth  6 each Topical Daily   ferrous sulfate  325 mg Oral Q breakfast   heparin injection (subcutaneous)  5,000 Units Subcutaneous Q8H   hydrALAZINE  50 mg Oral Q8H   insulin aspart  0-24 Units Subcutaneous Q4H   mupirocin ointment  1 Application Nasal BID   pantoprazole (PROTONIX) IV  40 mg Intravenous QHS   senna-docusate  1 tablet Oral BID   verapamil  180 mg Oral Daily   Continuous Infusions:  sodium chloride 75 mL/hr at 09/02/21 1107     LOS: 5 days   Time spent= 35 mins    Oluwatomisin Deman Arsenio Loader, MD [facility]  If 7PM-7AM, please contact night-coverage  09/03/2021, 8:36 AM

## 2021-09-04 DIAGNOSIS — I619 Nontraumatic intracerebral hemorrhage, unspecified: Secondary | ICD-10-CM | POA: Diagnosis not present

## 2021-09-04 LAB — MAGNESIUM: Magnesium: 1.6 mg/dL — ABNORMAL LOW (ref 1.7–2.4)

## 2021-09-04 LAB — PHOSPHORUS: Phosphorus: 2.4 mg/dL — ABNORMAL LOW (ref 2.5–4.6)

## 2021-09-04 LAB — GLUCOSE, CAPILLARY
Glucose-Capillary: 140 mg/dL — ABNORMAL HIGH (ref 70–99)
Glucose-Capillary: 158 mg/dL — ABNORMAL HIGH (ref 70–99)
Glucose-Capillary: 232 mg/dL — ABNORMAL HIGH (ref 70–99)
Glucose-Capillary: 259 mg/dL — ABNORMAL HIGH (ref 70–99)
Glucose-Capillary: 270 mg/dL — ABNORMAL HIGH (ref 70–99)
Glucose-Capillary: 56 mg/dL — ABNORMAL LOW (ref 70–99)
Glucose-Capillary: 98 mg/dL (ref 70–99)
Glucose-Capillary: 99 mg/dL (ref 70–99)

## 2021-09-04 LAB — CBC
HCT: 33.2 % — ABNORMAL LOW (ref 36.0–46.0)
Hemoglobin: 10.1 g/dL — ABNORMAL LOW (ref 12.0–15.0)
MCH: 25.6 pg — ABNORMAL LOW (ref 26.0–34.0)
MCHC: 30.4 g/dL (ref 30.0–36.0)
MCV: 84.1 fL (ref 80.0–100.0)
Platelets: 206 10*3/uL (ref 150–400)
RBC: 3.95 MIL/uL (ref 3.87–5.11)
RDW: 15.9 % — ABNORMAL HIGH (ref 11.5–15.5)
WBC: 11.2 10*3/uL — ABNORMAL HIGH (ref 4.0–10.5)
nRBC: 0 % (ref 0.0–0.2)

## 2021-09-04 LAB — BASIC METABOLIC PANEL
Anion gap: 11 (ref 5–15)
BUN: 16 mg/dL (ref 8–23)
CO2: 18 mmol/L — ABNORMAL LOW (ref 22–32)
Calcium: 8 mg/dL — ABNORMAL LOW (ref 8.9–10.3)
Chloride: 113 mmol/L — ABNORMAL HIGH (ref 98–111)
Creatinine, Ser: 1.4 mg/dL — ABNORMAL HIGH (ref 0.44–1.00)
GFR, Estimated: 40 mL/min — ABNORMAL LOW (ref >60)
Glucose, Bld: 122 mg/dL — ABNORMAL HIGH (ref 70–99)
Potassium: 2.8 mmol/L — ABNORMAL LOW (ref 3.5–5.1)
Sodium: 142 mmol/L (ref 135–145)

## 2021-09-04 MED ORDER — MAGNESIUM SULFATE 4 GM/100ML IV SOLN
4.0000 g | Freq: Once | INTRAVENOUS | Status: AC
  Administered 2021-09-04: 4 g via INTRAVENOUS
  Filled 2021-09-04: qty 100

## 2021-09-04 MED ORDER — POTASSIUM CHLORIDE CRYS ER 20 MEQ PO TBCR
40.0000 meq | EXTENDED_RELEASE_TABLET | ORAL | Status: DC
  Administered 2021-09-04: 40 meq via ORAL
  Filled 2021-09-04: qty 2

## 2021-09-04 MED ORDER — POTASSIUM CHLORIDE 10 MEQ/100ML IV SOLN
10.0000 meq | INTRAVENOUS | Status: AC
  Administered 2021-09-04 (×2): 10 meq via INTRAVENOUS
  Filled 2021-09-04 (×2): qty 100

## 2021-09-04 MED ORDER — POTASSIUM PHOSPHATES 15 MMOLE/5ML IV SOLN
30.0000 mmol | Freq: Once | INTRAVENOUS | Status: AC
  Administered 2021-09-04: 30 mmol via INTRAVENOUS
  Filled 2021-09-04: qty 10

## 2021-09-04 NOTE — Progress Notes (Signed)
PROGRESS NOTE    [name]  GQQ:[id number] DOB: March 23, 1950 DOA: 08/29/2021 PCP: Caprice Renshaw, MD   Brief Narrative:  71 y.o. female past medical history of PE on anticoagulation with Eliquis, diabetes, hypertension, prior strokes with unclear residual deficits, panniculitis and cellulitis, with an unclear last known well-possibly few days because she has not been eating much, brought for evaluation at [facility] and a CT head done revealed a right hippocampal hemorrhage with no IVH.  Extensive small vessel ischemic disease.  CT angio chest negative for PE. Transferred to [facility] for further management. She was evaluated in the past for some episodes of expressive aphasia without evidence of stroke on MRI.  EEG in July 2022 moderate to severe diffuse encephalopathy nonspecific etiology. Outpatient follow-up with Dr. April Manson in September 2022 for the episodes of slurred speech/aphasia x2 reports patient chronically wheelchair-bound with barely antigravity in lower extremity strength and 4/5 bilateral upper extremity strength, poor recall but at baseline awake and oriented to person.   After the CT findings of the ICH, she was given Andexxa for reversal of Eliquis. Cleviprex started to control blood pressure she was hypertensive on arrival.  She was transferred to [facility] under neurology.  Subsequently transferred under [place] on 09/01/2021.  Currently getting aggressive repletion of electrolytes  Assessment and plan:  Intracranial hemorrhage, right hippocampus -Likely in setting of HTN Eliquis use.  Repeat CT head showed stable hemorrhage, MRI did not show any significant interval change either.  EEG showed moderate diffuse encephalopathy.  Prior to admission patient was on aspirin and Eliquis.  This will now be on hold at least next 3-4 weeks thereafter can be reconsidered. -Echo showed EF greater than 75%, LDL 40, A1c 5.6. -PT/OT-SNF  Hypertensive urgency -Improved.   Continue closely monitoring this.   Fever/tachycardia/leukocytosis: -Currently his infectious work-up is negative.  Off antibiotics.  Continue to closely monitor him.    Dysphagia -Resolved on regular diet  Hypokalemia/hypomagnesemia/hypophosphatemia - Aggressive repletion.  Check phosphorus levels as well.  Hypernatremia: Resolved.  PT/OT - long term institutional care iwthout follow up with therapy.   DVT prophylaxis: Subcu Hep Code Status: Full Code Family Communication:    Status is: Inpatient Remains inpatient appropriate because: Currently getting aggressive repletion of electrolytes.  Once stable she is ready for discharge   Subjective: Seen and examined at bedside, no complaints.  Feels okay.  Examination: Constitutional: Not in acute distress Respiratory: Clear to auscultation bilaterally Cardiovascular: Normal sinus rhythm, no rubs Abdomen: Nontender nondistended good bowel sounds Musculoskeletal: No edema noted Skin: No rashes seen Neurologic: CN 2-12 grossly intact.  And nonfocalLeft side> right side weakness. Right gaze doesn't cross midline. Left side neglect.  Psychiatric: Normal judgment and insight. Alert and oriented x 3. Normal mood.    Objective: Vitals:   09/03/21 2340 09/04/21 0319 09/04/21 0600 09/04/21 0752  BP: 130/63 (!) 142/38 (!) 146/92 (!) 146/42  Pulse: 88 73 80 78  Resp: '17 16  16  '$ Temp: 97.6 F (36.4 C) 98.4 F (36.9 C)  98.2 F (36.8 C)  TempSrc: Oral Oral  Oral  SpO2: 97% 99%  100%  Weight:      Height:       No intake or output data in the 24 hours ending 09/04/21 0841  Filed Weights   08/29/21 1137  Weight: 101 kg     Data Reviewed:   CBC: Recent Labs  Lab 08/29/21 1153 08/31/21 0119 09/01/21 0252 09/02/21 0503 09/03/21 0324 09/04/21  0149  WBC 11.3* 15.6* 15.7* 11.5* 9.8 11.2*  NEUTROABS 10.3*  --   --   --   --   --   HGB 11.5* 10.8* 11.0* 11.2* 11.0* 10.1*  HCT 38.7 35.8* 37.1 37.5 34.5* 33.2*  MCV 87.4  86.7 87.9 85.0 81.9 84.1  PLT 327 253 265 211 221 657   Basic Metabolic Panel: Recent Labs  Lab 09/01/21 0252 09/01/21 1534 09/02/21 0503 09/02/21 1851 09/03/21 0324 09/04/21 0149  NA 148* 146* 142  --  142 142  K 3.1* 3.2* 2.7* 3.5 3.2* 2.8*  CL 110 110 113*  --  111 113*  CO2 22 21* 20*  --  20* 18*  GLUCOSE 92 105* 112*  --  131* 122*  BUN '22 19 18  '$ --  17 16  CREATININE 1.62* 1.53* 1.40*  --  1.42* 1.40*  CALCIUM 8.0* 7.8* 7.6*  --  8.0* 8.0*  MG 1.1* 1.9  --   --   --   --    GFR: Estimated Creatinine Clearance: 44.2 mL/min (A) (by C-G formula based on SCr of 1.4 mg/dL (H)). Liver Function Tests: Recent Labs  Lab 08/29/21 1200  AST 11*  ALT 10  ALKPHOS 65  BILITOT 0.9  PROT 7.3  ALBUMIN 3.0*   No results for input(s): "LIPASE", "AMYLASE" in the last 168 hours. No results for input(s): "AMMONIA" in the last 168 hours. Coagulation Profile: Recent Labs  Lab 08/29/21 1153  INR 1.4*   Cardiac Enzymes: No results for input(s): "CKTOTAL", "CKMB", "CKMBINDEX", "TROPONINI" in the last 168 hours. BNP (last 3 results) No results for input(s): "PROBNP" in the last 8760 hours. HbA1C: No results for input(s): "HGBA1C" in the last 72 hours. CBG: Recent Labs  Lab 09/03/21 1953 09/04/21 0004 09/04/21 0048 09/04/21 0320 09/04/21 0752  GLUCAP 144* 56* 98 270* 99   Lipid Profile: No results for input(s): "CHOL", "HDL", "LDLCALC", "TRIG", "CHOLHDL", "LDLDIRECT" in the last 72 hours. Thyroid Function Tests: No results for input(s): "TSH", "T4TOTAL", "FREET4", "T3FREE", "THYROIDAB" in the last 72 hours. Anemia Panel: No results for input(s): "VITAMINB12", "FOLATE", "FERRITIN", "TIBC", "IRON", "RETICCTPCT" in the last 72 hours. Sepsis Labs: Recent Labs  Lab 08/29/21 1153 08/29/21 1355  LATICACIDVEN 1.2 1.1    Recent Results (from the past 240 hour(s))  Blood Culture (routine x 2)     Status: None   Collection Time: 08/29/21 11:50 AM   Specimen: BLOOD  Result  Value Ref Range Status   Specimen Description BLOOD BLOOD RIGHT HAND  Final   Special Requests   Final    BOTTLES DRAWN AEROBIC AND ANAEROBIC Blood Culture adequate volume   Culture   Final    NO GROWTH 5 DAYS Performed at [facility], [address], [place], [place] [address]    Report Status 09/03/2021 FINAL  Final  Blood Culture (routine x 2)     Status: None   Collection Time: 08/29/21 11:53 AM   Specimen: BLOOD  Result Value Ref Range Status   Specimen Description BLOOD BLOOD LEFT FOREARM  Final   Special Requests   Final    BOTTLES DRAWN AEROBIC AND ANAEROBIC Blood Culture adequate volume   Culture   Final    NO GROWTH 5 DAYS Performed at [facility], [address], [place], [place] [address]    Report Status 09/03/2021 FINAL  Final  Urine Culture     Status: Abnormal   Collection Time: 08/29/21 11:53 AM   Specimen: Urine, Catheterized  Result Value Ref Range Status   Specimen Description   Final    URINE, CATHETERIZED Performed at [facility], [address], [place], [place] [address]    Special Requests   Final    NONE Performed at [facility], [address], [place], [place] [address]    Culture MULTIPLE SPECIES PRESENT, SUGGEST RECOLLECTION (A)  Final   Report Status 08/30/2021 FINAL  Final  Resp Panel by RT-PCR (Flu A&B, Covid) Anterior Nasal Swab     Status: None   Collection Time: 08/29/21  4:50 PM   Specimen: Anterior Nasal Swab  Result Value Ref Range Status   SARS Coronavirus 2 by RT PCR NEGATIVE NEGATIVE Final    Comment: (NOTE) SARS-CoV-2 target nucleic acids are NOT DETECTED.  The SARS-CoV-2 RNA is generally detectable in upper respiratory specimens during the acute phase of infection. The lowest concentration of SARS-CoV-2 viral copies this assay can detect is 138 copies/mL. A negative result does not preclude SARS-Cov-2 infection and should not be used as the sole basis for treatment or other patient management decisions. A negative result may  occur with  improper specimen collection/handling, submission of specimen other than nasopharyngeal swab, presence of viral mutation(s) within the areas targeted by this assay, and inadequate number of viral copies(<138 copies/mL). A negative result must be combined with clinical observations, patient history, and epidemiological information. The expected result is Negative.  Fact Sheet for Patients:  EntrepreneurPulse.com.au  Fact Sheet for Healthcare Providers:  IncredibleEmployment.be  This test is no t yet approved or cleared by the [place] FDA and  has been authorized for detection and/or diagnosis of SARS-CoV-2 by FDA under an Emergency Use Authorization (EUA). This EUA will remain  in effect (meaning this test can be used) for the duration of the COVID-19 declaration under Section 564(b)(1) of the Act, 21 U.S.C.section 360bbb-3(b)(1), unless the authorization is terminated  or revoked sooner.       Influenza A by PCR NEGATIVE NEGATIVE Final   Influenza B by PCR NEGATIVE NEGATIVE Final    Comment: (NOTE) The Xpert Xpress SARS-CoV-2/FLU/RSV plus assay is intended as an aid in the diagnosis of influenza from Nasopharyngeal swab specimens and should not be used as a sole basis for treatment. Nasal washings and aspirates are unacceptable for Xpert Xpress SARS-CoV-2/FLU/RSV testing.  Fact Sheet for Patients: EntrepreneurPulse.com.au  Fact Sheet for Healthcare Providers: IncredibleEmployment.be  This test is not yet approved or cleared by the [place] FDA and has been authorized for detection and/or diagnosis of SARS-CoV-2 by FDA under an Emergency Use Authorization (EUA). This EUA will remain in effect (meaning this test can be used) for the duration of the COVID-19 declaration under Section 564(b)(1) of the Act, 21 U.S.C. section 360bbb-3(b)(1), unless the authorization is terminated  or revoked.  Performed at [facility], [address], [place], [place] [address]   MRSA Next Gen by PCR, Nasal     Status: Abnormal   Collection Time: 08/29/21  6:36 PM   Specimen: Nasal Mucosa; Nasal Swab  Result Value Ref Range Status   MRSA by PCR Next Gen DETECTED (A) NOT DETECTED Final    Comment: RESULT CALLED TO, READ BACK BY AND VERIFIED WITH: RN DCharna Busman [phone] '@[phone]'$  FH (NOTE) The GeneXpert MRSA Assay (FDA approved for NASAL specimens only), is one component of a comprehensive MRSA colonization surveillance program. It is not intended to diagnose MRSA infection nor to guide or monitor treatment for MRSA infections. Test performance is not FDA  approved in patients less than 48 years old. Performed at [facility], [place] [address], [place], [place] [address]          Radiology Studies: No results found.      Scheduled Meds:  Chlorhexidine Gluconate Cloth  6 each Topical Daily   ferrous sulfate  325 mg Oral Q breakfast   heparin injection (subcutaneous)  5,000 Units Subcutaneous Q8H   hydrALAZINE  50 mg Oral Q8H   pantoprazole (PROTONIX) IV  40 mg Intravenous QHS   potassium chloride  40 mEq Oral Q4H   senna-docusate  1 tablet Oral BID   verapamil  180 mg Oral Daily   Continuous Infusions:  sodium chloride 75 mL/hr at 09/02/21 1107   potassium chloride       LOS: 6 days   Time spent= 35 mins    Ogechi Kuehnel Arsenio Loader, MD [facility]  If 7PM-7AM, please contact night-coverage  09/04/2021, 8:41 AM

## 2021-09-05 DIAGNOSIS — I619 Nontraumatic intracerebral hemorrhage, unspecified: Secondary | ICD-10-CM | POA: Diagnosis not present

## 2021-09-05 LAB — BASIC METABOLIC PANEL
Anion gap: 9 (ref 5–15)
BUN: 13 mg/dL (ref 8–23)
CO2: 23 mmol/L (ref 22–32)
Calcium: 8.1 mg/dL — ABNORMAL LOW (ref 8.9–10.3)
Chloride: 110 mmol/L (ref 98–111)
Creatinine, Ser: 1.29 mg/dL — ABNORMAL HIGH (ref 0.44–1.00)
GFR, Estimated: 44 mL/min — ABNORMAL LOW (ref >60)
Glucose, Bld: 144 mg/dL — ABNORMAL HIGH (ref 70–99)
Potassium: 3 mmol/L — ABNORMAL LOW (ref 3.5–5.1)
Sodium: 142 mmol/L (ref 135–145)

## 2021-09-05 LAB — GLUCOSE, CAPILLARY
Glucose-Capillary: 149 mg/dL — ABNORMAL HIGH (ref 70–99)
Glucose-Capillary: 154 mg/dL — ABNORMAL HIGH (ref 70–99)
Glucose-Capillary: 154 mg/dL — ABNORMAL HIGH (ref 70–99)
Glucose-Capillary: 160 mg/dL — ABNORMAL HIGH (ref 70–99)
Glucose-Capillary: 181 mg/dL — ABNORMAL HIGH (ref 70–99)
Glucose-Capillary: 185 mg/dL — ABNORMAL HIGH (ref 70–99)

## 2021-09-05 LAB — PHOSPHORUS: Phosphorus: 4.1 mg/dL (ref 2.5–4.6)

## 2021-09-05 LAB — MAGNESIUM: Magnesium: 2 mg/dL (ref 1.7–2.4)

## 2021-09-05 LAB — POTASSIUM: Potassium: 3.5 mmol/L (ref 3.5–5.1)

## 2021-09-05 MED ORDER — POTASSIUM CHLORIDE CRYS ER 10 MEQ PO TBCR
30.0000 meq | EXTENDED_RELEASE_TABLET | ORAL | Status: AC
  Administered 2021-09-05 (×2): 30 meq via ORAL
  Filled 2021-09-05 (×2): qty 3

## 2021-09-05 MED ORDER — POTASSIUM CHLORIDE 10 MEQ/100ML IV SOLN
10.0000 meq | INTRAVENOUS | Status: AC
  Administered 2021-09-05 (×2): 10 meq via INTRAVENOUS
  Filled 2021-09-05 (×2): qty 100

## 2021-09-05 MED ORDER — LOPERAMIDE HCL 2 MG PO CAPS
4.0000 mg | ORAL_CAPSULE | ORAL | Status: DC | PRN
  Administered 2021-09-05: 4 mg via ORAL
  Filled 2021-09-05: qty 2

## 2021-09-05 MED ORDER — PANTOPRAZOLE SODIUM 40 MG PO TBEC
40.0000 mg | DELAYED_RELEASE_TABLET | Freq: Every day | ORAL | Status: DC
  Administered 2021-09-05: 40 mg via ORAL
  Filled 2021-09-05: qty 1

## 2021-09-05 NOTE — Progress Notes (Signed)
PROGRESS NOTE    [name]  DJS:[id number] DOB: Mar 16, 1950 DOA: 08/29/2021 PCP: Caprice Renshaw, MD   Brief Narrative:  71 y.o. 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  Extensive small vessel ischemic disease.  CT angio chest negative for PE. Transferred to [facility] for further management. She was evaluated in the past for some episodes of expressive aphasia without evidence of stroke on MRI.  EEG in July 2022 moderate to severe diffuse encephalopathy nonspecific etiology. Outpatient follow-up with Dr. April Manson in September 2022 for the episodes of slurred speech/aphasia x2 reports patient chronically wheelchair-bound with barely antigravity in lower extremity strength and 4/5 bilateral upper extremity strength, poor recall but at baseline awake and oriented to person.   After the CT findings of the ICH, she was given Andexxa for reversal of Eliquis. Cleviprex started to control blood pressure she was hypertensive on arrival.  She was transferred to [facility] under neurology.  Subsequently transferred under [place] on 09/01/2021.  Currently getting aggressive repletion of electrolytes  Assessment and plan:  Intracranial hemorrhage, right hippocampus -Likely in setting of HTN Eliquis use.  Repeat CT head showed stable hemorrhage, MRI did not show any significant interval change either.  EEG showed moderate diffuse encephalopathy.  Prior to admission patient was on aspirin and Eliquis.  This will now be on hold at least next 3-4 weeks thereafter can be reconsidered. -Echo showed EF greater than 75%, LDL 40, A1c 5.6. -PT/OT-SNF  Hypertensive urgency -Improved.   Continue closely monitoring this.  Diarrhea -Stop senna BID, loperamine if needed.    Fever/tachycardia/leukocytosis: Resolved.  -Currently his infectious work-up is negative.  Off antibiotics.  Continue to closely monitor him.    Dysphagia -Resolved on regular diet  Hypokalemia/hypomagnesemia/hypophosphatemia - replete aggressively.   Hypernatremia: Resolved.  PT/OT - long term institutional care iwthout follow up with therapy.   DVT prophylaxis: Subcu Hep Code Status: Full Code Family Communication:    Status is: Inpatient Remains inpatient appropriate because: hopefully diarrhea slows down after stopping senna. K remains up. Should be able to get her back to her facility in next 24-48hrs.    Subjective: NO complaints, no abd pain, fevers, chills.   Examination: Constitutional: Not in acute distress Respiratory: Clear to auscultation bilaterally Cardiovascular: Normal sinus rhythm, no rubs Abdomen: Nontender nondistended good bowel sounds Musculoskeletal: No edema noted Skin: No rashes seen Neurologic: left sided neglect.  Psychiatric: Normal judgment and insight. Alert and oriented x 3. Normal mood.     Objective: Vitals:   09/04/21 2327 09/05/21 0321 09/05/21 0806 09/05/21 1148  BP: 137/81 (!) 151/61 (!) 149/74 (!) 152/52  Pulse: 77 80 88 (!) 104  Resp: '16 15 16 16  '$ Temp: 98.2 F (36.8 C) 98 F (36.7 C) 98.4 F (36.9 C) 98.9 F (37.2 C)  TempSrc: Oral Oral Oral Oral  SpO2: 100% 100% 99% 100%  Weight:      Height:        Intake/Output Summary (Last 24 hours) at 09/05/2021 1231 Last data filed at 09/05/2021 0400 Gross per 24 hour  Intake 1382.26 ml  Output 600 ml  Net 782.26 ml    Filed Weights   08/29/21 1137  Weight: 101 kg  Data Reviewed:   CBC: Recent Labs  Lab 08/31/21 0119 09/01/21 0252 09/02/21 0503 09/03/21 0324 09/04/21 0149  WBC 15.6* 15.7* 11.5* 9.8 11.2*  HGB 10.8* 11.0* 11.2* 11.0* 10.1*  HCT 35.8* 37.1 37.5 34.5*  33.2*  MCV 86.7 87.9 85.0 81.9 84.1  PLT 253 265 211 221 382   Basic Metabolic Panel: Recent Labs  Lab 09/01/21 0252 09/01/21 1534 09/02/21 0503 09/02/21 1851 09/03/21 0324 09/04/21 0149 09/05/21 0440  NA 148* 146* 142  --  142 142 142  K 3.1* 3.2* 2.7* 3.5 3.2* 2.8* 3.0*  CL 110 110 113*  --  111 113* 110  CO2 22 21* 20*  --  20* 18* 23  GLUCOSE 92 105* 112*  --  131* 122* 144*  BUN '22 19 18  '$ --  '17 16 13  '$ CREATININE 1.62* 1.53* 1.40*  --  1.42* 1.40* 1.29*  CALCIUM 8.0* 7.8* 7.6*  --  8.0* 8.0* 8.1*  MG 1.1* 1.9  --   --   --  1.6* 2.0  PHOS  --   --   --   --   --  2.4* 4.1   GFR: Estimated Creatinine Clearance: 48 mL/min (A) (by C-G formula based on SCr of 1.29 mg/dL (H)). Liver Function Tests: No results for input(s): "AST", "ALT", "ALKPHOS", "BILITOT", "PROT", "ALBUMIN" in the last 168 hours.  No results for input(s): "LIPASE", "AMYLASE" in the last 168 hours. No results for input(s): "AMMONIA" in the last 168 hours. Coagulation Profile: No results for input(s): "INR", "PROTIME" in the last 168 hours.  Cardiac Enzymes: No results for input(s): "CKTOTAL", "CKMB", "CKMBINDEX", "TROPONINI" in the last 168 hours. BNP (last 3 results) No results for input(s): "PROBNP" in the last 8760 hours. HbA1C: No results for input(s): "HGBA1C" in the last 72 hours. CBG: Recent Labs  Lab 09/04/21 2002 09/04/21 2323 09/05/21 0321 09/05/21 0809 09/05/21 1150  GLUCAP 259* 232* 149* 154* 185*   Lipid Profile: No results for input(s): "CHOL", "HDL", "LDLCALC", "TRIG", "CHOLHDL", "LDLDIRECT" in the last 72 hours. Thyroid Function Tests: No results for input(s): "TSH", "T4TOTAL", "FREET4", "T3FREE", "THYROIDAB" in the last 72 hours. Anemia Panel: No results for input(s): "VITAMINB12", "FOLATE", "FERRITIN", "TIBC", "IRON", "RETICCTPCT" in the last 72 hours. Sepsis Labs: Recent Labs  Lab 08/29/21 1355  LATICACIDVEN 1.1    Recent Results (from the past 240 hour(s))  Blood  Culture (routine x 2)     Status: None   Collection Time: 08/29/21 11:50 AM   Specimen: BLOOD  Result Value Ref Range Status   Specimen Description BLOOD BLOOD RIGHT HAND  Final   Special Requests   Final    BOTTLES DRAWN AEROBIC AND ANAEROBIC Blood Culture adequate volume   Culture   Final    NO GROWTH 5 DAYS Performed at [facility], [address] [place], [place] [address]    Report Status 09/03/2021 FINAL  Final  Blood Culture (routine x 2)     Status: None   Collection Time: 08/29/21 11:53 AM   Specimen: BLOOD  Result Value Ref Range Status   Specimen Description BLOOD BLOOD LEFT FOREARM  Final   Special Requests   Final    BOTTLES DRAWN AEROBIC AND ANAEROBIC Blood Culture adequate volume   Culture   Final    NO GROWTH 5 DAYS Performed at [facility], [address] [place], [place] [address]    Report Status 09/03/2021 FINAL  Final  Urine Culture     Status: Abnormal  Collection Time: 08/29/21 11:53 AM   Specimen: Urine, Catheterized  Result Value Ref Range Status   Specimen Description   Final    URINE, CATHETERIZED Performed at [facility], [address] [place], [place] [address]    Special Requests   Final    NONE Performed at [facility], [address] [place], [place] [address]    Culture MULTIPLE SPECIES PRESENT, SUGGEST RECOLLECTION (A)  Final   Report Status 08/30/2021 FINAL  Final  Resp Panel by RT-PCR (Flu A&B, Covid) Anterior Nasal Swab     Status: None   Collection Time: 08/29/21  4:50 PM   Specimen: Anterior Nasal Swab  Result Value Ref Range Status   SARS Coronavirus 2 by RT PCR NEGATIVE NEGATIVE Final    Comment: (NOTE) SARS-CoV-2 target nucleic acids are NOT DETECTED.  The SARS-CoV-2 RNA is generally detectable in upper respiratory specimens during the acute phase of infection. The lowest concentration of SARS-CoV-2 viral copies this assay can detect is 138 copies/mL. A negative result does not preclude SARS-Cov-2 infection and should  not be used as the sole basis for treatment or other patient management decisions. A negative result may occur with  improper specimen collection/handling, submission of specimen other than nasopharyngeal swab, presence of viral mutation(s) within the areas targeted by this assay, and inadequate number of viral copies(<138 copies/mL). A negative result must be combined with clinical observations, patient history, and epidemiological information. The expected result is Negative.  Fact Sheet for Patients:  EntrepreneurPulse.com.au  Fact Sheet for Healthcare Providers:  IncredibleEmployment.be  This test is no t yet approved or cleared by the [place] FDA and  has been authorized for detection and/or diagnosis of SARS-CoV-2 by FDA under an Emergency Use Authorization (EUA). This EUA will remain  in effect (meaning this test can be used) for the duration of the COVID-19 declaration under Section 564(b)(1) of the Act, 21 U.S.C.section 360bbb-3(b)(1), unless the authorization is terminated  or revoked sooner.       Influenza A by PCR NEGATIVE NEGATIVE Final   Influenza B by PCR NEGATIVE NEGATIVE Final    Comment: (NOTE) The Xpert Xpress SARS-CoV-2/FLU/RSV plus assay is intended as an aid in the diagnosis of influenza from Nasopharyngeal swab specimens and should not be used as a sole basis for treatment. Nasal washings and aspirates are unacceptable for Xpert Xpress SARS-CoV-2/FLU/RSV testing.  Fact Sheet for Patients: EntrepreneurPulse.com.au  Fact Sheet for Healthcare Providers: IncredibleEmployment.be  This test is not yet approved or cleared by the [place] FDA and has been authorized for detection and/or diagnosis of SARS-CoV-2 by FDA under an Emergency Use Authorization (EUA). This EUA will remain in effect (meaning this test can be used) for the duration of the COVID-19 declaration under  Section 564(b)(1) of the Act, 21 U.S.C. section 360bbb-3(b)(1), unless the authorization is terminated or revoked.  Performed at [facility], [address], [place], [place] [address]   MRSA Next Gen by PCR, Nasal     Status: Abnormal   Collection Time: 08/29/21  6:36 PM   Specimen: Nasal Mucosa; Nasal Swab  Result Value Ref Range Status   MRSA by PCR Next Gen DETECTED (A) NOT DETECTED Final    Comment: RESULT CALLED TO, READ BACK BY AND VERIFIED WITH: RN DCharna Busman [phone] '@[phone]'$  FH (NOTE) The GeneXpert MRSA Assay (FDA approved for NASAL specimens only), is one component of a comprehensive MRSA colonization surveillance program. It is not intended to diagnose MRSA infection nor to guide  or monitor treatment for MRSA infections. Test performance is not FDA approved in patients less than 57 years old. Performed at [facility], [place] [address] [place], [place] [address]          Radiology Studies: No results found.      Scheduled Meds:  Chlorhexidine Gluconate Cloth  6 each Topical Daily   ferrous sulfate  325 mg Oral Q breakfast   heparin injection (subcutaneous)  5,000 Units Subcutaneous Q8H   hydrALAZINE  50 mg Oral Q8H   pantoprazole  40 mg Oral QHS   potassium chloride  30 mEq Oral Q3H   verapamil  180 mg Oral Daily   Continuous Infusions:  sodium chloride 75 mL/hr at 09/04/21 2258     LOS: 7 days   Time spent= 35 mins    Tishawna Larouche Arsenio Loader, MD [facility]  If 7PM-7AM, please contact night-coverage  09/05/2021, 12:31 PM

## 2021-09-05 NOTE — Care Management Important Message (Signed)
Important Message  Patient Details  Name: [name] MRN: [id number] Date of Birth: 01/22/1951   Medicare Important Message Given:  Yes     [name] 09/05/2021, 3:25 PM

## 2021-09-06 DIAGNOSIS — I619 Nontraumatic intracerebral hemorrhage, unspecified: Secondary | ICD-10-CM | POA: Diagnosis not present

## 2021-09-06 LAB — GLUCOSE, CAPILLARY
Glucose-Capillary: 123 mg/dL — ABNORMAL HIGH (ref 70–99)
Glucose-Capillary: 127 mg/dL — ABNORMAL HIGH (ref 70–99)
Glucose-Capillary: 176 mg/dL — ABNORMAL HIGH (ref 70–99)
Glucose-Capillary: 182 mg/dL — ABNORMAL HIGH (ref 70–99)

## 2021-09-06 LAB — BASIC METABOLIC PANEL
Anion gap: 9 (ref 5–15)
BUN: 12 mg/dL (ref 8–23)
CO2: 22 mmol/L (ref 22–32)
Calcium: 8 mg/dL — ABNORMAL LOW (ref 8.9–10.3)
Chloride: 113 mmol/L — ABNORMAL HIGH (ref 98–111)
Creatinine, Ser: 1.31 mg/dL — ABNORMAL HIGH (ref 0.44–1.00)
GFR, Estimated: 44 mL/min — ABNORMAL LOW (ref >60)
Glucose, Bld: 125 mg/dL — ABNORMAL HIGH (ref 70–99)
Potassium: 3.2 mmol/L — ABNORMAL LOW (ref 3.5–5.1)
Sodium: 144 mmol/L (ref 135–145)

## 2021-09-06 LAB — PHOSPHORUS: Phosphorus: 3.2 mg/dL (ref 2.5–4.6)

## 2021-09-06 LAB — MAGNESIUM: Magnesium: 1.7 mg/dL (ref 1.7–2.4)

## 2021-09-06 MED ORDER — PANTOPRAZOLE SODIUM 40 MG PO PACK: 40.0000 mg | PACK | Freq: Every day | ORAL | Status: DC

## 2021-09-06 MED ORDER — LOPERAMIDE HCL 1 MG/7.5ML PO SUSP
4.0000 mg | ORAL | Status: DC | PRN
Start: 2021-09-06 — End: 2021-09-06

## 2021-09-06 MED ORDER — VERAPAMIL HCL 120 MG PO TABS: 60.0000 mg | ORAL_TABLET | Freq: Three times a day (TID) | ORAL | Status: AC

## 2021-09-06 MED ORDER — OXYCODONE-ACETAMINOPHEN 5-325 MG PO TABS: 1.0000 | ORAL_TABLET | Freq: Two times a day (BID) | ORAL | 0 refills | Status: AC | PRN

## 2021-09-06 MED ORDER — FERROUS SULFATE 300 (60 FE) MG/5ML PO SYRP: 300.0000 mg | ORAL_SOLUTION | Freq: Every day | ORAL | 3 refills | Status: AC

## 2021-09-06 MED ORDER — POTASSIUM CHLORIDE 10 MEQ/100ML IV SOLN
10.0000 meq | INTRAVENOUS | Status: AC
  Administered 2021-09-06 (×4): 10 meq via INTRAVENOUS
  Filled 2021-09-06 (×4): qty 100

## 2021-09-06 MED ORDER — VERAPAMIL HCL 120 MG PO TABS
60.0000 mg | ORAL_TABLET | Freq: Three times a day (TID) | ORAL | Status: DC
  Administered 2021-09-06: 60 mg via ORAL
  Filled 2021-09-06 (×3): qty 0.5

## 2021-09-06 MED ORDER — HYDRALAZINE HCL 50 MG PO TABS: 50.0000 mg | ORAL_TABLET | Freq: Three times a day (TID) | ORAL | Status: DC

## 2021-09-06 MED ORDER — PANTOPRAZOLE 2 MG/ML SUSPENSION: 40.0000 mg | Freq: Every day | ORAL | Status: DC

## 2021-09-06 MED ORDER — FERROUS SULFATE 300 (60 FE) MG/5ML PO SYRP
300.0000 mg | ORAL_SOLUTION | Freq: Every day | ORAL | Status: DC
  Filled 2021-09-06: qty 5

## 2021-09-06 MED ORDER — PANTOPRAZOLE SODIUM 40 MG PO TBEC: 40.0000 mg | DELAYED_RELEASE_TABLET | Freq: Every day | ORAL | Status: DC

## 2021-09-06 MED ORDER — POTASSIUM CHLORIDE 20 MEQ PO PACK
40.0000 meq | PACK | ORAL | Status: AC
  Administered 2021-09-06 (×2): 40 meq via ORAL
  Filled 2021-09-06 (×2): qty 2

## 2021-09-06 NOTE — Discharge Summary (Addendum)
Physician Discharge Summary  [name] ATF:[id number] DOB: May 17, 1950 DOA: 08/29/2021  PCP: Caprice Renshaw, MD  Admit date: 08/29/2021 Discharge date: 09/06/2021  Admitted From: Long-term care Disposition: Long-term care SNF  Recommendations for Outpatient Follow-up:  Follow up with PCP in 1-2 weeks  repeat BMP and magnesium level in next 2-3 days Hydralazine 50 mg 3 times daily.  PPI at bedtime. Stop Eliquis and aspirin for now, reassess in about 4 weeks or so Most of her meds have been switched over to liquid form or it can be crushed due to her dysphagia.    Discharge Condition: Stable CODE STATUS: Full code Diet recommendation: Regular;Thin liquid    Liquid Administration via: Straw;Cup Medication Administration: Crushed with puree Supervision: Patient able to self feed;Full supervision/cueing for compensatory strategies Compensations: Slow rate;Small sips/bites;Follow solids with liquid;Minimize environmental distractions Postural Changes: Seated upright at 90 degrees   Brief/Interim Summary: 71 y.o. female past medical history of PE on anticoagulation with Eliquis, diabetes, hypertension, prior strokes with unclear residual deficits, panniculitis and cellulitis, with an unclear last known well-possibly few days because she has not been eating much, brought for evaluation at [facility] and a CT head done revealed a right hippocampal hemorrhage with no IVH.  Extensive small vessel ischemic disease.  CT angio chest negative for PE. Transferred to [facility] for further management. She was evaluated in the past for some episodes of expressive aphasia without evidence of stroke on MRI.  EEG in July 2022 moderate to severe diffuse encephalopathy nonspecific etiology. Outpatient follow-up with Dr. April Manson in September 2022 for the episodes of slurred speech/aphasia x2 reports patient chronically wheelchair-bound with barely antigravity in lower extremity strength and 4/5  bilateral upper extremity strength, poor recall but at baseline awake and oriented to person.   After the CT findings of the ICH, she was given Andexxa for reversal of Eliquis. Cleviprex started to control blood pressure she was hypertensive on arrival.  She was transferred to [facility] under neurology.  Subsequently transferred under [place] on 09/01/2021.  Currently getting aggressive repletion of electrolytes.  Eventually patient remained medically stable and was deemed stable to be discharged home with recommendations as stated above.   Assessment and plan:   Intracranial hemorrhage, right hippocampus -Likely in setting of HTN Eliquis use.  Repeat CT head showed stable hemorrhage, MRI did not show any significant interval change either.  EEG showed moderate diffuse encephalopathy.  Prior to admission patient was on aspirin and Eliquis.  This will now be on hold at least next 3-4 weeks thereafter can be reconsidered.  Outpatient follow-up in next 4 weeks with neurology -Echo showed EF greater than 75%, LDL 40, A1c 5.6. -PT/OT-SNF   Hypertensive urgency -Improved.  Continue closely monitoring this.   Diarrhea -Improved, senna twice daily stopped.   Fever/tachycardia/leukocytosis: Resolved.  -Infectious work-up negative.  No antibiotics.     Dysphagia -Resolved on regular diet   Hypokalemia/hypomagnesemia/hypophosphatemia - replete aggressively.    Hypernatremia: Resolved.      Discharge Diagnoses:  Principal Problem:   Hemorrhagic stroke [facility]) Active Problems:   ICH (intracerebral hemorrhage) ([place])      Consultations: Neurology  Subjective: Feels much better no complaints.  Tolerating orals.  Discharge Exam: Vitals:   09/06/21 1117 09/06/21 1141  BP: (!) 153/93 (!) 146/93  Pulse: 89 82  Resp: 18 20  Temp: 99.3 F (37.4 C) 97.9 F (36.6 C)  SpO2: 100% 98%   Vitals:   09/06/21 0328 09/06/21 0737 09/06/21  1117 09/06/21 1141  BP: (!) 180/48 (!) 159/61 (!)  153/93 (!) 146/93  Pulse: 78 94 89 82  Resp: '18 19 18 20  '$ Temp: 98 F (36.7 C) 99.1 F (37.3 C) 99.3 F (37.4 C) 97.9 F (36.6 C)  TempSrc: Oral Oral Oral Oral  SpO2: 100% 100% 100% 98%  Weight:      Height:        General: Pt is alert, awake, not in acute distress Cardiovascular: RRR, S1/S2 +, no rubs, no gallops Respiratory: CTA bilaterally, no wheezing, no rhonchi Abdominal: Soft, NT, ND, bowel sounds + Extremities: no edema, no cyanosis  Discharge Instructions   Allergies as of 09/06/2021       Reactions   Vancomycin Hives, Rash   "big red bumps, then they turned white, then I shedded like a snake."         Medication List     STOP taking these medications    aspirin 81 MG chewable tablet   Eliquis 5 MG Tabs tablet Generic drug: apixaban   ferrous sulfate 325 (65 FE) MG tablet Replaced by: ferrous sulfate 300 (60 Fe) MG/5ML syrup   verapamil 180 MG CR tablet Commonly known as: CALAN-SR Replaced by: verapamil 120 MG tablet       TAKE these medications    acetaminophen 650 MG CR tablet Commonly known as: TYLENOL Take 1,300 mg by mouth every 8 (eight) hours as needed for pain.   ammonium lactate 12 % lotion Commonly known as: LAC-HYDRIN Apply topically 2 (two) times daily.   atorvastatin 40 MG tablet Commonly known as: LIPITOR Take 40 mg by mouth daily.   cholecalciferol 25 MCG (1000 UNIT) tablet Commonly known as: VITAMIN D3 Take 1,000 Units by mouth daily.   feeding supplement (GLUCERNA SHAKE) Liqd Take 237 mLs by mouth 3 (three) times daily between meals.   ferrous sulfate 300 (60 Fe) MG/5ML syrup Take 5 mLs (300 mg total) by mouth daily with breakfast. Start taking on: September 07, 2021 Replaces: ferrous sulfate 325 (65 FE) MG tablet   fluticasone 50 MCG/ACT nasal spray Commonly known as: FLONASE Place 1 spray into both nostrils daily.   gabapentin 100 MG capsule Commonly known as: NEURONTIN Take 100 mg by mouth 3 (three) times  daily.   hydrALAZINE 50 MG tablet Commonly known as: APRESOLINE Take 1 tablet (50 mg total) by mouth every 8 (eight) hours.   lisinopril 30 MG tablet Commonly known as: ZESTRIL Take 15 mg by mouth daily.   metFORMIN 1000 MG tablet Commonly known as: GLUCOPHAGE Take 500 mg by mouth 2 (two) times a day.   mirtazapine 7.5 MG tablet Commonly known as: REMERON Take 1 tablet (7.5 mg total) by mouth at bedtime.   oxyCODONE-acetaminophen 5-325 MG tablet Commonly known as: PERCOCET/ROXICET Take 1 tablet by mouth 2 (two) times daily as needed for up to 3 days.   pantoprazole sodium 40 mg Commonly known as: PROTONIX Take 40 mg by mouth at bedtime.   senna 8.6 MG Tabs tablet Commonly known as: SENOKOT Take 1 tablet (8.6 mg total) by mouth daily as needed for mild constipation.   verapamil 120 MG tablet Commonly known as: CALAN Take 0.5 tablets (60 mg total) by mouth every 8 (eight) hours. Replaces: verapamil 180 MG CR tablet   vitamin C 500 MG tablet Commonly known as: ASCORBIC ACID Take 500 mg by mouth daily.        Contact information for follow-up providers     Alric Ran,  MD. Daphane Shepherd on 11/14/2021.   Specialty: Neurology Contact information: [address] [address] [phone]         Caprice Renshaw, MD Follow up in 1 week(s).   Specialty: Internal Medicine Contact information: [place] [address] [phone]              Contact information for after-discharge care     [facility] .   Service: Skilled Nursing Contact information: [place] [address] [phone]                    Allergies  Allergen Reactions   Vancomycin Hives and Rash    "big red bumps, then they turned white, then I shedded like a snake."     You were cared for by a hospitalist during your hospital stay. If you have any questions about your discharge  medications or the care you received while you were in the hospital after you are discharged, you can call the unit and asked to speak with the hospitalist on call if the hospitalist that took care of you is not available. Once you are discharged, your primary care physician will handle any further medical issues. Please note that no refills for any discharge medications will be authorized once you are discharged, as it is imperative that you return to your primary care physician (or establish a relationship with a primary care physician if you do not have one) for your aftercare needs so that they can reassess your need for medications and monitor your lab values.   Procedures/Studies: MR ANGIO HEAD WO CONTRAST  Result Date: 08/31/2021 CLINICAL DATA:  Intraparenchymal hemorrhage involving the posterior right hippocampus. EXAM: MRA HEAD WITHOUT CONTRAST TECHNIQUE: Angiographic images of the Circle of Willis were acquired using MRA technique without intravenous contrast. COMPARISON:  None Available. FINDINGS: Anterior circulation: Atherosclerotic irregularity is present within the cavernous internal carotid arteries bilaterally. The high cervical segments are within normal limits. Moderate narrowing is present the right cavernous ICA. The supraclinoid internal carotid arteries are within normal limits bilaterally. Moderate stenosis is present the distal right A1 segment. The left A1 segment is normal. M1 segments are normal bilaterally. MCA bifurcations are intact. No vascular malformation is evident. Posterior circulation: Vertebral arteries are codominant. PICA origins are visualized and within normal limits. The vertebrobasilar junction and basilar artery is normal. Both posterior cerebral arteries originate basilar tip. Mild narrowing is present in the proximal left P2 segment and more distal right P2 segment without a significant stenosis on either side. PCA branch vessels are within normal limits.  Anatomic variants: None Other: None. IMPRESSION: 1. Moderate narrowing of the right cavernous ICA. 2. Moderate stenosis of the distal right A1 segment. 3. Mild narrowing of the proximal left P2 segment and more distal right P2 segment without a significant stenosis on either side. 4. No focal lesion or vascular malformation to explain the hemorrhage. Electronically Signed   By: San Morelle M.D.   On: 08/31/2021 12:54   ECHOCARDIOGRAM COMPLETE  Result Date: 08/30/2021    ECHOCARDIOGRAM REPORT   Patient Name:   [name] Register Date of Exam: 08/30/2021 Medical Rec #:  [id number]     Height:       66.0 in Accession #:    [id number]    Weight:       222.7 lb Date of Birth:  1950-04-27  BSA:          2.093 m Patient Age:    71 years      BP:           124/90 mmHg Patient Gender: F             HR:           110 bpm. Exam Location:  Inpatient Procedure: 2D Echo, Cardiac Doppler and Color Doppler Indications:    Stroke  History:        Patient has prior history of Echocardiogram examinations, most                 recent 08/26/2020. Arrythmias:Tachycardia,                 Signs/Symptoms:Shortness of Breath and Altered Mental Status;                 Risk Factors:Morbid obesity.  Sonographer:    Merrie Roof RDCS Referring Phys: [id number] ASHISH ARORA IMPRESSIONS  1. Left ventricular ejection fraction, by estimation, is >75%. The left ventricle has hyperdynamic function. The left ventricle has no regional wall motion abnormalities. There is mild concentric left ventricular hypertrophy. Left ventricular diastolic parameters are consistent with Grade I diastolic dysfunction (impaired relaxation).  2. Right ventricular systolic function is hyperdynamic. The right ventricular size is normal. Tricuspid regurgitation signal is inadequate for assessing PA pressure.  3. Left atrial size was severely dilated.  4. The mitral valve is normal in structure. No evidence of mitral valve regurgitation. No evidence of mitral stenosis.   5. The aortic valve is tricuspid. There is mild calcification of the aortic valve. There is mild thickening of the aortic valve. Aortic valve regurgitation is not visualized. Aortic valve sclerosis/calcification is present, without any evidence of aortic stenosis.  6. The inferior vena cava is collapsed, consistent with low right atrial pressures. Conclusion(s)/Recommendation(s): The study suggests hypovolemia. FINDINGS  Left Ventricle: There is mid-cavity obliteration with an intracavitary "gradient" of 3.8 m/s. Left ventricular ejection fraction, by estimation, is >75%. The left ventricle has hyperdynamic function. The left ventricle has no regional wall motion abnormalities. The left ventricular internal cavity size was normal in size. There is mild concentric left ventricular hypertrophy. Left ventricular diastolic parameters are consistent with Grade I diastolic dysfunction (impaired relaxation). Indeterminate filling pressures. Right Ventricle: The right ventricular size is normal. No increase in right ventricular wall thickness. Right ventricular systolic function is hyperdynamic. Tricuspid regurgitation signal is inadequate for assessing PA pressure. Left Atrium: Left atrial size was severely dilated. Right Atrium: Right atrial size was normal in size. Pericardium: There is no evidence of pericardial effusion. Mitral Valve: The mitral valve is normal in structure. Mild mitral annular calcification. No evidence of mitral valve regurgitation. No evidence of mitral valve stenosis. Tricuspid Valve: The tricuspid valve is grossly normal. Tricuspid valve regurgitation is not demonstrated. Aortic Valve: Gradients are exaggerated by the hyperdynamic left ventricular contractility. The aortic valve is tricuspid. There is mild calcification of the aortic valve. There is mild thickening of the aortic valve. Aortic valve regurgitation is not visualized. Aortic valve sclerosis/calcification is present, without any  evidence of aortic stenosis. Aortic valve mean gradient measures 11.4 mmHg. Aortic valve peak gradient measures 24.3 mmHg. Aortic valve area, by VTI measures 2.03 cm. Pulmonic Valve: The pulmonic valve was grossly normal. Pulmonic valve regurgitation is not visualized. Aorta: The aortic root and ascending aorta are structurally normal, with no evidence of dilitation. Venous  IAS/Shunts: The  interatrial septum was not well visualized.  LEFT VENTRICLE PLAX 2D LVIDd:         4.40 cm   Diastology LVIDs:         2.50 cm   LV e' medial:    5.44 cm/s LV PW:         1.20 cm   LV E/e' medial:  14.4 LV IVS:        1.20 cm   LV e' lateral:   8.55 cm/s LVOT diam:     2.20 cm   LV E/e' lateral: 9.2 LV SV:         70 LV SV Index:   33 LVOT Area:     3.80 cm  RIGHT VENTRICLE RV Basal diam:  2.90 cm LEFT ATRIUM              Index        RIGHT ATRIUM           Index LA diam:        5.00 cm  2.39 cm/m   RA Area:     10.80 cm LA Vol (A2C):   65.2 ml  31.15 ml/m  RA Volume:   20.10 ml  9.60 ml/m LA Vol (A4C):   113.0 ml 53.98 ml/m LA Biplane Vol: 85.9 ml  41.04 ml/m  AORTIC VALVE AV Area (Vmax):    2.05 cm AV Area (Vmean):   2.19 cm AV Area (VTI):     2.03 cm AV Vmax:           246.50 cm/s AV Vmean:          151.912 cm/s AV VTI:            0.345 m AV Peak Grad:      24.3 mmHg AV Mean Grad:      11.4 mmHg LVOT Vmax:         133.00 cm/s LVOT Vmean:        87.400 cm/s LVOT VTI:          0.184 m LVOT/AV VTI ratio: 0.53  AORTA Ao Root diam: 3.20 cm Ao Asc diam:  2.70 cm MITRAL VALVE MV Area (PHT): 4.80 cm     SHUNTS MV Decel Time: 158 msec     Systemic VTI:  0.18 m MV E velocity: 78.60 cm/s   Systemic Diam: 2.20 cm MV A velocity: 134.00 cm/s MV E/A ratio:  0.59 Mihai Croitoru MD Electronically signed by Sanda Klein MD Signature Date/Time: 08/30/2021/6:06:37 PM    Final    EEG adult  Result Date: 08/30/2021 Lora Havens, MD     08/30/2021 10:09 AM Patient Name: [name] MRN: [id number] Epilepsy Attending: Lora Havens Referring Physician/Provider: Amie Portland, MD Date: 08/29/2021 Duration: 23.09 mins Patient history: 71 year old with history of PE on Eliquis presenting for evaluation of altered mental status of few days. EEG to evaluate for seizure Level of alertness: lethargic AEDs during EEG study: None Technical aspects: This EEG study was done with scalp electrodes positioned according to the 10-20 International system of electrode placement. Electrical activity was acquired at a sampling rate of '500Hz'$  and reviewed with a high frequency filter of '70Hz'$  and a low frequency filter of '1Hz'$ . EEG data were recorded continuously and digitally stored. Description: No clear posterior dominant rhythm was seen. EEG showed continuous 4 to 5 Hz theta slowing in left hemisphere as well as low amplitude 2 to 3 Hz delta slowing in right hemisphere.  Hyperventilation and photic stimulation were not performed.   ABNORMALITY -Continuous slow, generalized and lateralized right hemisphere IMPRESSION: This study is suggestive of cortical dysfunction in right hemisphere likely secondary to underlying structural abnormality/stroke.  Additionally there is moderate diffuse encephalopathy, nonspecific etiology.  No seizures or  epileptiform discharges were seen during the study. [place]   CT HEAD WO CONTRAST (5MM)  Result Date: 08/30/2021 CLINICAL DATA:  71 year old female with altered mental status and acute hemorrhage of the right hippocampus yesterday. EXAM: CT HEAD WITHOUT CONTRAST TECHNIQUE: Contiguous axial images were obtained from the base of the skull through the vertex without intravenous contrast. RADIATION DOSE REDUCTION: This exam was performed according to the departmental dose-optimization program which includes automated exposure control, adjustment of the mA and/or kV according to patient size and/or use of iterative reconstruction technique. COMPARISON:  Head CT 08/29/2021 and earlier. FINDINGS: Brain: Intra-axial  hyperdense hemorrhage which mostly conforms to the right hippocampus encompasses 42 by 23 x 13 mm (AP by transverse by CC) for an estimated blood volume of 6 mL (stable). Only minor regional edema and mass effect. Questionable trace intraventricular extension to the right occipital horn is stable. No ventriculomegaly. No other extra-axial extension identified. Extensive underlying chronic small vessel disease in the bilateral cerebral white matter, left deep gray nuclei. No cortically based acute infarct identified. Vascular: Extensive Calcified atherosclerosis at the skull base. No suspicious intracranial vascular hyperdensity. Skull: Stable and intact. Sinuses/Orbits: Visualized paranasal sinuses and mastoids are clear. Other: Calcified scalp vessel atherosclerosis. No acute orbit or scalp soft tissue finding. IMPRESSION: 1. Stable hemorrhage of the right hippocampus, 6-7 mL. Minor regional edema and no significant mass effect. Questionable trace intraventricular extension of blood is stable with no ventriculomegaly. 2. No new intracranial abnormality. Underlying advanced small vessel disease, severe calcified atherosclerosis. Electronically Signed   By: Genevie Ann M.D.   On: 08/30/2021 07:11   MR BRAIN W WO CONTRAST  Result Date: 08/30/2021 CLINICAL DATA:  Follow-up examination for hemorrhagic stroke. EXAM: MRI HEAD WITHOUT AND WITH CONTRAST TECHNIQUE: Multiplanar, multiecho pulse sequences of the brain and surrounding structures were obtained without and with intravenous contrast. CONTRAST:  21m GADAVIST GADOBUTROL 1 MMOL/ML IV SOLN COMPARISON:  Prior CT from earlier the same day as well as previous brain MRI from 07/20/2020. FINDINGS: Brain: Examination degraded by motion artifact, limiting assessment. Generalized age-related cerebral atrophy. Patchy and confluent T2/FLAIR hyperintensity involving the periventricular and deep white matter both cerebral hemispheres as well as the pons, most consistent with  chronic small vessel ischemic disease, moderately advanced in nature. Remote lacunar infarcts noted at the left corona radiata, bilateral basal ganglia, and thalami. Previously identified intraparenchymal hemorrhage centered at the posterior right hippocampal formation again seen, grossly similar in size and morphology as compared to previous exam. Mild surrounding edema with partial effacement of the adjacent right lateral ventricle. No visible underlying lesion or abnormal enhancement seen on this motion degraded exam. Trace layering blood noted within the occipital horn of the left lateral ventricle, which could be related to trace intraventricular extension and/or subarachnoid penetration with redistribution. No other visible acute intracranial hemorrhage. Multiple additional scattered chronic micro hemorrhages noted about the cerebellum, brainstem, and thalami, likely related to chronic poorly controlled hypertension. No mass lesion or significant midline shift. No hydrocephalus or extra-axial fluid collection. Pituitary gland and suprasellar region within normal limits. No abnormal enhancement. Vascular: Major intracranial vascular flow voids are grossly maintained at the skull base. Skull and upper cervical spine: Craniocervical junction  normal limits. Bone marrow signal intensity within normal limits. No scalp soft tissue abnormality. Sinuses/Orbits: Globes and orbital soft tissues demonstrate no acute finding. Paranasal sinuses are largely clear. Trace left mastoid effusion, of doubtful significance. Other: None. IMPRESSION: 1. Motion degraded exam. 2. No significant interval change in intraparenchymal hemorrhage centered at the posterior right hippocampal formation. No visible underlying lesion or abnormal enhancement on this motion degraded exam. 3. Trace layering blood within the occipital horn of the left lateral ventricle, which could be related to trace intraventricular extension and/or subarachnoid  penetration with redistribution. No hydrocephalus. 4. Multiple additional scattered chronic micro hemorrhages clustered about the cerebellum, brainstem, and thalami, most characteristic of chronic poorly controlled hypertension. 5. Underlying atrophy with moderate chronic small vessel ischemic disease, with a few additional remote lacunar infarcts as above. Electronically Signed   By: Jeannine Boga M.D.   On: 08/30/2021 00:46   CT Angio Chest PE W and/or Wo Contrast  Result Date: 08/29/2021 CLINICAL DATA:  Pulmonary embolism suspected, high probability. Mental status changes. EXAM: CT ANGIOGRAPHY CHEST WITH CONTRAST TECHNIQUE: Multidetector CT imaging of the chest was performed using the standard protocol during bolus administration of intravenous contrast. Multiplanar CT image reconstructions and MIPs were obtained to evaluate the vascular anatomy. RADIATION DOSE REDUCTION: This exam was performed according to the departmental dose-optimization program which includes automated exposure control, adjustment of the mA and/or kV according to patient size and/or use of iterative reconstruction technique. CONTRAST:  38m OMNIPAQUE IOHEXOL 350 MG/ML SOLN COMPARISON:  Chest radiography same day FINDINGS: Cardiovascular: Pulmonary arterial opacification is good. No pulmonary emboli. Cardiomegaly. Coronary artery calcification. Aortic atherosclerotic calcification without aneurysm or dissection. Mediastinum/Nodes: No mediastinal or hilar mass or lymphadenopathy. Lungs/Pleura: Mild dependent atelectasis. Very tiny effusions. No pneumonia or mass. Upper Abdomen: Negative Musculoskeletal: No acute musculoskeletal finding. Review of the MIP images confirms the above findings. IMPRESSION: No pulmonary emboli. Minimal dependent pulmonary atelectasis, otherwise no active chest disease. Coronary artery calcification. Aortic atherosclerotic calcification. Electronically Signed   By: MNelson ChimesM.D.   On: 08/29/2021 15:28    CT Head Wo Contrast  Result Date: 08/29/2021 CLINICAL DATA:  Mental status change of unknown cause EXAM: CT HEAD WITHOUT CONTRAST TECHNIQUE: Contiguous axial images were obtained from the base of the skull through the vertex without intravenous contrast. RADIATION DOSE REDUCTION: This exam was performed according to the departmental dose-optimization program which includes automated exposure control, adjustment of the mA and/or kV according to patient size and/or use of iterative reconstruction technique. COMPARISON:  08/24/2020 FINDINGS: Brain: Hemorrhage within the hippocampus on the right measuring 4.3 x 2.1 x 1.6 cm (volume = 7.6 cm^3). This is most consistent with hemorrhagic infarction. No intraventricular penetration. No subarachnoid penetration. Elsewhere, the brain shows atrophy and chronic small-vessel ischemic change as seen previously. No hydrocephalus or extra-axial collection. Vascular: There is atherosclerotic calcification of the major vessels at the base of the brain. 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 No intraventricular or subarachnoid penetration. Extensive chronic small-vessel ischemic changes seen elsewhere throughout the brain. Critical Value/emergent results were called by telephone at the time of interpretation on 08/29/2021 at 3:26 pm to provider JULIE HAVILAND , who verbally acknowledged these results. Electronically Signed   By: MNelson ChimesM.D.   On: 08/29/2021 15:26   DG Chest Port 1 View  Result Date: 08/29/2021 CLINICAL DATA:  71year old female with questionable sepsis EXAM: PORTABLE CHEST 1  VIEW COMPARISON:  08/20/2020 FINDINGS: Cardiomediastinal silhouette unchanged in size and contour. No evidence of central vascular congestion. No interlobular septal thickening. Persisting low lung volumes No pneumothorax or pleural effusion.  Coarsened interstitial markings, with no confluent airspace disease. No acute displaced fracture. IMPRESSION: Low lung volumes with no definite acute cardiopulmonary disease Electronically Signed   By: Corrie Mckusick D.O.   On: 08/29/2021 12:12     The results of significant diagnostics from this hospitalization (including imaging, microbiology, ancillary and laboratory) are listed below for reference.     Microbiology: Recent Results (from the past 240 hour(s))  Blood Culture (routine x 2)     Status: None   Collection Time: 08/29/21 11:50 AM   Specimen: BLOOD  Result Value Ref Range Status   Specimen Description BLOOD BLOOD RIGHT HAND  Final   Special Requests   Final    BOTTLES DRAWN AEROBIC AND ANAEROBIC Blood Culture adequate volume   Culture   Final    NO GROWTH 5 DAYS Performed at [facility], [address] [place], [place] [address]    Report Status 09/03/2021 FINAL  Final  Blood Culture (routine x 2)     Status: None   Collection Time: 08/29/21 11:53 AM   Specimen: BLOOD  Result Value Ref Range Status   Specimen Description BLOOD BLOOD LEFT FOREARM  Final   Special Requests   Final    BOTTLES DRAWN AEROBIC AND ANAEROBIC Blood Culture adequate volume   Culture   Final    NO GROWTH 5 DAYS Performed at [facility], [address] [place], [place] [address]    Report Status 09/03/2021 FINAL  Final  Urine Culture     Status: Abnormal   Collection Time: 08/29/21 11:53 AM   Specimen: Urine, Catheterized  Result Value Ref Range Status   Specimen Description   Final    URINE, CATHETERIZED Performed at [facility] [address] [place], [place] [address]    Special Requests   Final    NONE Performed at [facility], [address] [place], [place] [address]    Culture MULTIPLE SPECIES PRESENT, SUGGEST RECOLLECTION (A)  Final   Report Status 08/30/2021 FINAL  Final  Resp Panel by RT-PCR (Flu A&B, Covid) Anterior Nasal Swab     Status: None   Collection Time: 08/29/21   4:50 PM   Specimen: Anterior Nasal Swab  Result Value Ref Range Status   SARS Coronavirus 2 by RT PCR NEGATIVE NEGATIVE Final    Comment: (NOTE) SARS-CoV-2 target nucleic acids are NOT DETECTED.  The SARS-CoV-2 RNA is generally detectable in upper respiratory specimens during the acute phase of infection. The lowest concentration of SARS-CoV-2 viral copies this assay can detect is 138 copies/mL. A negative result does not preclude SARS-Cov-2 infection and should not be used as the sole basis for treatment or other patient management decisions. A negative result may occur with  improper specimen collection/handling, submission of specimen other than nasopharyngeal swab, presence of viral mutation(s) within the areas targeted by this assay, and inadequate number of viral copies(<138 copies/mL). A negative result must be combined with clinical observations, patient history, and epidemiological information. The expected result is Negative.  Fact Sheet for Patients:  EntrepreneurPulse.com.au  Fact Sheet for Healthcare Providers:  IncredibleEmployment.be  This test is no t yet approved or cleared by the [place] FDA and  has been authorized for detection and/or diagnosis of SARS-CoV-2 by FDA under an Emergency Use Authorization (EUA). This EUA will  remain  in effect (meaning this test can be used) for the duration of the COVID-19 declaration under Section 564(b)(1) of the Act, 21 U.S.C.section 360bbb-3(b)(1), unless the authorization is terminated  or revoked sooner.       Influenza A by PCR NEGATIVE NEGATIVE Final   Influenza B by PCR NEGATIVE NEGATIVE Final    Comment: (NOTE) The Xpert Xpress SARS-CoV-2/FLU/RSV plus assay is intended as an aid in the diagnosis of influenza from Nasopharyngeal swab specimens and should not be used as a sole basis for treatment. Nasal washings and aspirates are unacceptable for Xpert Xpress  SARS-CoV-2/FLU/RSV testing.  Fact Sheet for Patients: EntrepreneurPulse.com.au  Fact Sheet for Healthcare Providers: IncredibleEmployment.be  This test is not yet approved or cleared by the [place] FDA and has been authorized for detection and/or diagnosis of SARS-CoV-2 by FDA under an Emergency Use Authorization (EUA). This EUA will remain in effect (meaning this test can be used) for the duration of the COVID-19 declaration under Section 564(b)(1) of the Act, 21 U.S.C. section 360bbb-3(b)(1), unless the authorization is terminated or revoked.  Performed at [facility], [address], [place], [place] [address]   MRSA Next Gen by PCR, Nasal     Status: Abnormal   Collection Time: 08/29/21  6:36 PM   Specimen: Nasal Mucosa; Nasal Swab  Result Value Ref Range Status   MRSA by PCR Next Gen DETECTED (A) NOT DETECTED Final    Comment: RESULT CALLED TO, READ BACK BY AND VERIFIED WITH: RN DCharna Busman [phone] '@2154'$  FH (NOTE) The GeneXpert MRSA Assay (FDA approved for NASAL specimens only), is one component of a comprehensive MRSA colonization surveillance program. It is not intended to diagnose MRSA infection nor to guide or monitor treatment for MRSA infections. Test performance is not FDA approved in patients less than 55 years old. Performed at [facility], [place] [address], [place], [place] [address]      Labs: BNP (last 3 results) No results for input(s): "BNP" in the last 8760 hours. Basic Metabolic Panel: Recent Labs  Lab 09/01/21 0252 09/01/21 1534 09/02/21 0503 09/02/21 1851 09/03/21 0324 09/04/21 0149 09/05/21 0440 09/05/21 1310 09/06/21 0455  NA 148* 146* 142  --  142 142 142  --  144  K 3.1* 3.2* 2.7*   < > 3.2* 2.8* 3.0* 3.5 3.2*  CL 110 110 113*  --  111 113* 110  --  113*  CO2 22 21* 20*  --  20* 18* 23  --  22  GLUCOSE 92 105* 112*  --  131* 122* 144*  --  125*  BUN '22 19 18  '$ --  '17 16 13  '$ --  12   CREATININE 1.62* 1.53* 1.40*  --  1.42* 1.40* 1.29*  --  1.31*  CALCIUM 8.0* 7.8* 7.6*  --  8.0* 8.0* 8.1*  --  8.0*  MG 1.1* 1.9  --   --   --  1.6* 2.0  --  1.7  PHOS  --   --   --   --   --  2.4* 4.1  --  3.2   < > = values in this interval not displayed.   Liver Function Tests: No results for input(s): "AST", "ALT", "ALKPHOS", "BILITOT", "PROT", "ALBUMIN" in the last 168 hours. No results for input(s): "LIPASE", "AMYLASE" in the last 168 hours. No results for input(s): "AMMONIA" in the last 168 hours. CBC: Recent Labs  Lab 08/31/21 0119 09/01/21 0252 09/02/21 0503 09/03/21 0998  09/04/21 0149  WBC 15.6* 15.7* 11.5* 9.8 11.2*  HGB 10.8* 11.0* 11.2* 11.0* 10.1*  HCT 35.8* 37.1 37.5 34.5* 33.2*  MCV 86.7 87.9 85.0 81.9 84.1  PLT 253 265 211 221 206   Cardiac Enzymes: No results for input(s): "CKTOTAL", "CKMB", "CKMBINDEX", "TROPONINI" in the last 168 hours. BNP: Invalid input(s): "POCBNP" CBG: Recent Labs  Lab 09/05/21 1942 09/05/21 2332 09/06/21 0322 09/06/21 0747 09/06/21 1133  GLUCAP 181* 154* 123* 127* 176*   D-Dimer No results for input(s): "DDIMER" in the last 72 hours. Hgb A1c No results for input(s): "HGBA1C" in the last 72 hours. Lipid Profile No results for input(s): "CHOL", "HDL", "LDLCALC", "TRIG", "CHOLHDL", "LDLDIRECT" in the last 72 hours. Thyroid function studies No results for input(s): "TSH", "T4TOTAL", "T3FREE", "THYROIDAB" in the last 72 hours.  Invalid input(s): "FREET3" Anemia work up No results for input(s): "VITAMINB12", "FOLATE", "FERRITIN", "TIBC", "IRON", "RETICCTPCT" in the last 72 hours. Urinalysis    Component Value Date/Time   COLORURINE YELLOW 08/29/2021 1153   APPEARANCEUR CLOUDY (A) 08/29/2021 1153   LABSPEC >1.030 (H) 08/29/2021 1153   PHURINE 6.0 08/29/2021 1153   GLUCOSEU NEGATIVE 08/29/2021 1153   HGBUR TRACE (A) 08/29/2021 1153   BILIRUBINUR SMALL (A) 08/29/2021 1153   KETONESUR NEGATIVE 08/29/2021 1153   PROTEINUR  100 (A) 08/29/2021 1153   UROBILINOGEN 0.2 12/25/2014 1248   NITRITE NEGATIVE 08/29/2021 1153   LEUKOCYTESUR NEGATIVE 08/29/2021 1153   Sepsis Labs Recent Labs  Lab 09/01/21 0252 09/02/21 0503 09/03/21 0324 09/04/21 0149  WBC 15.7* 11.5* 9.8 11.2*   Microbiology Recent Results (from the past 240 hour(s))  Blood Culture (routine x 2)     Status: None   Collection Time: 08/29/21 11:50 AM   Specimen: BLOOD  Result Value Ref Range Status   Specimen Description BLOOD BLOOD RIGHT HAND  Final   Special Requests   Final    BOTTLES DRAWN AEROBIC AND ANAEROBIC Blood Culture adequate volume   Culture   Final    NO GROWTH 5 DAYS Performed at [facility], [address] [place], [place] [address]    Report Status 09/03/2021 FINAL  Final  Blood Culture (routine x 2)     Status: None   Collection Time: 08/29/21 11:53 AM   Specimen: BLOOD  Result Value Ref Range Status   Specimen Description BLOOD BLOOD LEFT FOREARM  Final   Special Requests   Final    BOTTLES DRAWN AEROBIC AND ANAEROBIC Blood Culture adequate volume   Culture   Final    NO GROWTH 5 DAYS Performed at [facility], [address] [place], [place] [address]    Report Status 09/03/2021 FINAL  Final  Urine Culture     Status: Abnormal   Collection Time: 08/29/21 11:53 AM   Specimen: Urine, Catheterized  Result Value Ref Range Status   Specimen Description   Final    URINE, CATHETERIZED Performed at [facility], [address] [place], [place] [address]    Special Requests   Final    NONE Performed at [facility], [address] [place],  [address]    Culture MULTIPLE SPECIES PRESENT, SUGGEST RECOLLECTION (A)  Final   Report Status 08/30/2021 FINAL  Final  Resp Panel by RT-PCR (Flu A&B, Covid) Anterior Nasal Swab     Status: None   Collection Time: 08/29/21  4:50 PM   Specimen: Anterior Nasal Swab  Result Value Ref Range Status   SARS Coronavirus 2 by RT PCR NEGATIVE NEGATIVE Final  Comment:  (NOTE) SARS-CoV-2 target nucleic acids are NOT DETECTED.  The SARS-CoV-2 RNA is generally detectable in upper respiratory specimens during the acute phase of infection. The lowest concentration of SARS-CoV-2 viral copies this assay can detect is 138 copies/mL. A negative result does not preclude SARS-Cov-2 infection and should not be used as the sole basis for treatment or other patient management decisions. A negative result may occur with  improper specimen collection/handling, submission of specimen other than nasopharyngeal swab, presence of viral mutation(s) within the areas targeted by this assay, and inadequate number of viral copies(<138 copies/mL). A negative result must be combined with clinical observations, patient history, and epidemiological information. The expected result is Negative.  Fact Sheet for Patients:  EntrepreneurPulse.com.au  Fact Sheet for Healthcare Providers:  IncredibleEmployment.be  This test is no t yet approved or cleared by the [place] FDA and  has been authorized for detection and/or diagnosis of SARS-CoV-2 by FDA under an Emergency Use Authorization (EUA). This EUA will remain  in effect (meaning this test can be used) for the duration of the COVID-19 declaration under Section 564(b)(1) of the Act, 21 U.S.C.section 360bbb-3(b)(1), unless the authorization is terminated  or revoked sooner.       Influenza A by PCR NEGATIVE NEGATIVE Final   Influenza B by PCR NEGATIVE NEGATIVE Final    Comment: (NOTE) The Xpert Xpress SARS-CoV-2/FLU/RSV plus assay is intended as an aid in the diagnosis of influenza from Nasopharyngeal swab specimens and should not be used as a sole basis for treatment. Nasal washings and aspirates are unacceptable for Xpert Xpress SARS-CoV-2/FLU/RSV testing.  Fact Sheet for Patients: EntrepreneurPulse.com.au  Fact Sheet for Healthcare  Providers: IncredibleEmployment.be  This test is not yet approved or cleared by the [place] FDA and has been authorized for detection and/or diagnosis of SARS-CoV-2 by FDA under an Emergency Use Authorization (EUA). This EUA will remain in effect (meaning this test can be used) for the duration of the COVID-19 declaration under Section 564(b)(1) of the Act, 21 U.S.C. section 360bbb-3(b)(1), unless the authorization is terminated or revoked.  Performed at [facility], [address] [place], [place] [address]   MRSA Next Gen by PCR, Nasal     Status: Abnormal   Collection Time: 08/29/21  6:36 PM   Specimen: Nasal Mucosa; Nasal Swab  Result Value Ref Range Status   MRSA by PCR Next Gen DETECTED (A) NOT DETECTED Final    Comment: RESULT CALLED TO, READ BACK BY AND VERIFIED WITH: RN DCharna Busman [phone] '@[phone]'$  FH (NOTE) The GeneXpert MRSA Assay (FDA approved for NASAL specimens only), is one component of a comprehensive MRSA colonization surveillance program. It is not intended to diagnose MRSA infection nor to guide or monitor treatment for MRSA infections. Test performance is not FDA approved in patients less than 13 years old. Performed at [facility], [place] [address] [place], [place] [address]      Time coordinating discharge:  I have spent 35 minutes face to face with the patient and on the ward discussing the patients care, assessment, plan and disposition with other care givers. >50% of the time was devoted counseling the patient about the risks and benefits of treatment/Discharge disposition and coordinating care.   SIGNED:   Damita Lack, MD  [facility] 09/06/2021, 12:28 PM   If 7PM-7AM, please contact night-coverage

## 2021-09-06 NOTE — Evaluation (Signed)
Clinical/Bedside Swallow Evaluation Patient Details  Name: [name] MRN: [id number] Date of Birth: Nov 28, 1950  Today's Date: 09/06/2021 Time: SLP Start Time (ACUTE ONLY): 0908 SLP Stop Time (ACUTE ONLY): 0933 SLP Time Calculation (min) (ACUTE ONLY): 25 min  Past Medical History:  Past Medical History:  Diagnosis Date   Cellulitis lower extremities   4 day hospitalization 12/25/14-12/29/14   Essential hypertension, benign    History of MRSA infection    History of panniculitis    Pulmonary emboli ([place]) 2009   Skin ulcer of thigh (H[facility])    Type 2 diabetes mellitus ([place])    Past Surgical History:  Past Surgical History:  Procedure Laterality Date   PARTIAL HYSTERECTOMY     Right thigh ulcer debridement  2008   Two occasions - wound VAC   HPI:  [name] is a 71 y.o. 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    Outpatient follow-up with Dr. April Manson in September 2022 for the episodes of slurred speech/aphasia x2 reports patient chronically wheelchair-bound with barely antigravity in lower extremity strength and 4/5 bilateral upper extremity strength, poor recall but at baseline awake and oriented to person. Pt has been evalauted by SLP at bedside in 2022, no signs of aspiration. Pt was evaluated this admission previously; she was initially very confused andorally holding PO, but her mentation returned to baseline and pt was abletoself feed regular solids. Her RN reported that she was struggling to finish meals so SLP reordered.    Assessment / Plan /  Recommendation  Clinical Impression  Pt presents with primary problem of prolonged mastication. She has full dentition and is able to [facility] regualr solids. SLP observed her self feeding breakfast, including a bacon strip. Pt enjoys this food but does chew for a prolonged period and benefitted from SLP reminder to swallow bolus. Pt subsequently able to continue slowly self feeding rest of meal including peeling and self feeding a banana. At times her attention drifts off, but with occasional interaction she is better able to sustain attention and initiate swallowing and self feeding and avoid pocketing. She would benefit from supervision during meals, but other than adding extra gravy or sauce, would not recommend any texture modification as pt still enjoys her food and would be resistant to chopped texture per her report. SLP to f/u at next level of care. SLP Visit Diagnosis: Dysphagia, oral phase (R13.11)    Aspiration Risk  Risk for inadequate nutrition/hydration    Diet Recommendation Regular;Thin liquid   Liquid Administration via: Straw;Cup Medication Administration: Crushed with puree Supervision: Patient able to self feed;Full supervision/cueing for compensatory strategies Compensations: Slow rate;Small sips/bites;Follow solids with liquid;Minimize environmental distractions Postural Changes: Seated upright at 90 degrees    Other  Recommendations Oral Care Recommendations: Oral care BID    Recommendations for follow up therapy are one component of a multi-disciplinary discharge planning process, led by the attending physician.  Recommendations may be updated based on patient status, additional functional criteria and insurance authorization.  Follow up Recommendations Skilled nursing-short term rehab (<3 hours/day)  Assistance Recommended at Discharge Frequent or constant Supervision/Assistance  Functional Status Assessment Patient has had a recent decline in their functional  status and demonstrates the ability to make significant improvements in function in a reasonable and predictable amount of time.  Frequency and Duration min 2x/week  2 weeks       Prognosis        Swallow Study   General HPI: [name] is a 71 y.o. 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    Outpatient follow-up with Dr. April Manson in September 2022 for the episodes of slurred speech/aphasia x2 reports patient chronically wheelchair-bound with barely antigravity in lower extremity strength and 4/5 bilateral upper extremity strength, poor recall but at baseline awake and oriented to person. Pt has been evalauted by SLP at bedside in 2022, no signs of aspiration. Pt was evaluated this admission previously; she was initially very confused andorally holding PO, but her mentation returned to baseline and pt was abletoself feed regular solids. Her RN reported that she was struggling to finish meals so SLP reordered. Type of Study: Bedside Swallow Evaluation Previous Swallow Assessment: see HPI Diet Prior to this Study: Regular;Thin liquids Temperature Spikes Noted: No Respiratory Status: Room air History of Recent Intubation: No Behavior/Cognition: Alert;Cooperative;Pleasant mood Oral Cavity Assessment: Within Functional Limits Oral Care Completed by SLP: No Oral Cavity - Dentition: Adequate natural dentition Vision: Functional for self-feeding Self-Feeding Abilities: Able to feed self Patient Positioning: Upright in bed Baseline Vocal Quality: Normal Volitional  Cough: Strong Volitional Swallow: Able to elicit    Oral/Motor/Sensory Function Overall Oral Motor/Sensory Function: Within functional limits   Ice Chips     Thin Liquid Thin Liquid: Within functional limits Presentation: Straw;Cup    Nectar Thick Nectar Thick Liquid: Not tested   Honey Thick Honey Thick Liquid: Not tested   Puree Puree: Not tested   Solid     Solid: Impaired Oral Phase Functional Implications: Prolonged oral transit      [name], [name] 09/06/2021,9:33 AM

## 2021-09-06 NOTE — Progress Notes (Signed)
At 1734 Pt discharging to SNF, [facility]. Copy of instructions printed and sent with transporters for facility.  Pt d/c'd with belongings, transported by [facility].   Able to reach staff member at 1740 at [facility], Report given to nurse, Ebony Hail at [facility].

## 2021-09-06 NOTE — Discharge Instructions (Signed)
Stop Aspirin and Eliquis for atleast a month, it can be reassessed thereafter  Check BMP and Magnesium levels in next 2-3 days.   Diet Recommendation Regular;Thin liquid    Liquid Administration via: Straw;Cup Medication Administration: Crushed with puree Supervision: Patient able to self feed;Full supervision/cueing for compensatory strategies Compensations: Slow rate;Small sips/bites;Follow solids with liquid;Minimize environmental distractions Postural Changes: Seated upright at 90 degrees

## 2021-09-06 NOTE — Plan of Care (Signed)
Pt returning to SNF for continued therapy for stroke care.

## 2021-09-06 NOTE — TOC Transition Note (Signed)
Transition of Care [facility]) - CM/SW Discharge Note   Patient Details  Name: [name] MRN: [id number] Date of Birth: 22-Feb-1950  Transition of Care [facility]) CM/SW Contact:  Geralynn Ochs, LCSW Phone Number: 09/06/2021, 3:04 PM   Clinical Narrative:   CSW confirmed with MD that patient is medically stable, updated [facility] and sent discharge summary. CSW updated patient's son, Jeneen Rinks, about return to SNF, he is in agreement. Transport arranged with [facility] for next available.  Nurse to call report to [phone].    Final next level of care: Skilled Nursing Facility Barriers to Discharge: Barriers Resolved   Patient Goals and CMS Choice Patient states their goals for this hospitalization and ongoing recovery are:: patient unable to participate in goal setting, not oriented CMS Medicare.gov Compare Post Acute Care list provided to:: Patient Represenative (must comment) Choice offered to / list presented to : Adult Children  Discharge Placement              Patient chooses bed at:  [facility]) Patient to be transferred to facility by: [place] Name of family member notified: Jeneen Rinks Patient and family notified of of transfer: 09/06/21  Discharge Plan and Services                                     Social Determinants of Health (SDOH) Interventions     Readmission Risk Interventions     No data to display

## 2021-09-08 ENCOUNTER — Encounter (HOSPITAL_COMMUNITY): Payer: Self-pay

## 2021-09-08 ENCOUNTER — Emergency Department (HOSPITAL_COMMUNITY): Payer: Medicare (Managed Care)

## 2021-09-08 ENCOUNTER — Emergency Department (HOSPITAL_COMMUNITY)
Admission: EM | Admit: 2021-09-08 | Discharge: 2021-09-08 | Disposition: A | Discharge status: Home / Self Care | Payer: Medicare (Managed Care) | Attending: Emergency Medicine | Admitting: Emergency Medicine

## 2021-09-08 DIAGNOSIS — S32038A Other fracture of third lumbar vertebra, initial encounter for closed fracture: Secondary | ICD-10-CM | POA: Diagnosis not present

## 2021-09-08 DIAGNOSIS — S32030A Wedge compression fracture of third lumbar vertebra, initial encounter for closed fracture: Secondary | ICD-10-CM

## 2021-09-08 DIAGNOSIS — K5900 Constipation, unspecified: Secondary | ICD-10-CM | POA: Insufficient documentation

## 2021-09-08 DIAGNOSIS — E278 Other specified disorders of adrenal gland: Secondary | ICD-10-CM

## 2021-09-08 DIAGNOSIS — E279 Disorder of adrenal gland, unspecified: Secondary | ICD-10-CM

## 2021-09-08 DIAGNOSIS — Z20822 Contact with and (suspected) exposure to covid-19: Secondary | ICD-10-CM | POA: Diagnosis not present

## 2021-09-08 DIAGNOSIS — R509 Fever, unspecified: Secondary | ICD-10-CM | POA: Diagnosis not present

## 2021-09-08 DIAGNOSIS — X58XXXA Exposure to other specified factors, initial encounter: Secondary | ICD-10-CM | POA: Diagnosis not present

## 2021-09-08 DIAGNOSIS — R4182 Altered mental status, unspecified: Secondary | ICD-10-CM

## 2021-09-08 DIAGNOSIS — S3992XA Unspecified injury of lower back, initial encounter: Secondary | ICD-10-CM | POA: Diagnosis present

## 2021-09-08 DIAGNOSIS — E860 Dehydration: Secondary | ICD-10-CM

## 2021-09-08 DIAGNOSIS — K5909 Other constipation: Secondary | ICD-10-CM

## 2021-09-08 LAB — CBC
HCT: 36.3 % (ref 36.0–46.0)
Hemoglobin: 11.2 g/dL — ABNORMAL LOW (ref 12.0–15.0)
MCH: 26.5 pg (ref 26.0–34.0)
MCHC: 30.9 g/dL (ref 30.0–36.0)
MCV: 86 fL (ref 80.0–100.0)
Platelets: 249 10*3/uL (ref 150–400)
RBC: 4.22 MIL/uL (ref 3.87–5.11)
RDW: 17.1 % — ABNORMAL HIGH (ref 11.5–15.5)
WBC: 10.1 10*3/uL (ref 4.0–10.5)
nRBC: 0 % (ref 0.0–0.2)

## 2021-09-08 LAB — LACTIC ACID, PLASMA: Lactic Acid, Venous: 1.5 mmol/L (ref 0.5–1.9)

## 2021-09-08 LAB — COMPREHENSIVE METABOLIC PANEL
ALT: 16 U/L (ref 0–44)
AST: 14 U/L — ABNORMAL LOW (ref 15–41)
Albumin: 2.6 g/dL — ABNORMAL LOW (ref 3.5–5.0)
Alkaline Phosphatase: 54 U/L (ref 38–126)
Anion gap: 8 (ref 5–15)
BUN: 15 mg/dL (ref 8–23)
CO2: 24 mmol/L (ref 22–32)
Calcium: 8.6 mg/dL — ABNORMAL LOW (ref 8.9–10.3)
Chloride: 111 mmol/L (ref 98–111)
Creatinine, Ser: 1.43 mg/dL — ABNORMAL HIGH (ref 0.44–1.00)
GFR, Estimated: 39 mL/min — ABNORMAL LOW (ref >60)
Glucose, Bld: 121 mg/dL — ABNORMAL HIGH (ref 70–99)
Potassium: 4.3 mmol/L (ref 3.5–5.1)
Sodium: 143 mmol/L (ref 135–145)
Total Bilirubin: 0.7 mg/dL (ref 0.3–1.2)
Total Protein: 6.4 g/dL — ABNORMAL LOW (ref 6.5–8.1)

## 2021-09-08 LAB — URINALYSIS, ROUTINE W REFLEX MICROSCOPIC
Bacteria, UA: NONE SEEN
Bilirubin Urine: NEGATIVE
Glucose, UA: NEGATIVE mg/dL
Hgb urine dipstick: NEGATIVE
Ketones, ur: 5 mg/dL — AB
Nitrite: NEGATIVE
Protein, ur: >=300 mg/dL — AB
Specific Gravity, Urine: 1.019 (ref 1.005–1.030)
pH: 5 (ref 5.0–8.0)

## 2021-09-08 LAB — SARS CORONAVIRUS 2 BY RT PCR: SARS Coronavirus 2 by RT PCR: NEGATIVE

## 2021-09-08 LAB — LIPASE, BLOOD: Lipase: 35 U/L (ref 11–51)

## 2021-09-08 MED ORDER — ACETAMINOPHEN 325 MG PO TABS
650.0000 mg | ORAL_TABLET | Freq: Once | ORAL | Status: AC
Start: 2021-09-08 — End: 2021-09-08
  Administered 2021-09-08: 650 mg via ORAL
  Filled 2021-09-08: qty 2

## 2021-09-08 MED ORDER — LACTATED RINGERS IV BOLUS
500.0000 mL | Freq: Once | INTRAVENOUS | Status: AC
  Administered 2021-09-08: 500 mL via INTRAVENOUS

## 2021-09-08 MED ORDER — SULFAMETHOXAZOLE-TRIMETHOPRIM 800-160 MG PO TABS: 1.0000 | ORAL_TABLET | Freq: Two times a day (BID) | ORAL | 0 refills | Status: AC

## 2021-09-08 MED ORDER — SODIUM CHLORIDE 0.9 % IV BOLUS
1000.0000 mL | Freq: Once | INTRAVENOUS | Status: AC
  Administered 2021-09-08: 1000 mL via INTRAVENOUS

## 2021-09-08 MED ORDER — IOHEXOL 300 MG/ML  SOLN
75.0000 mL | Freq: Once | INTRAMUSCULAR | Status: AC | PRN
  Administered 2021-09-08: 75 mL via INTRAVENOUS

## 2021-09-08 NOTE — ED Provider Notes (Signed)
To me is pending CT imaging.  CT imaging shows no acute emergent findings.  Patient has fever here in the ER.  We will give antibiotics as she has small leukocytes in her urine.  We will give a course of antibiotics to take at her facility.  Recommending return if she has worsening symptoms persistent fevers or any additional concerns.   Luna Fuse, MD 09/08/21 [phone]

## 2021-09-08 NOTE — ED Triage Notes (Signed)
Pt BIBA from [facility] home. Pt was only responsive to painful stimuli on EMS arrival. Staff states pt is "not herself", but has not seen her since d/c Tuesday.   139/81 97% 99 HR 214 FSBG

## 2021-09-08 NOTE — ED Notes (Signed)
Pt to CT

## 2021-09-08 NOTE — Discharge Instructions (Addendum)
It was our pleasure to provide your ER care today - we hope that you feel better.  You did have some inflammatory markers in your urine and antibiotics have been sent to your pharmacy.  Drink plenty of fluids/stay well hydrated.  For fever, we did send cultures the results of which should be back in 1-2 days - have your doctor follow up on culture results.   For constipation, drink adequate fluids, get adequate fiber in diet, take colace (stool softener) 2x/day, and miralax (laxative) once per day as need.   Follow up closely with primary care doctor in the next 2-3 days for recheck.   Return to  ER if worse, new symptoms, new or severe pain, chest pain, trouble breathing, new neurologic symptoms, persistent vomiting, or other concern.

## 2021-09-08 NOTE — ED Provider Notes (Addendum)
[facility] EMERGENCY DEPARTMENT Provider Note   CSN: [id number] Arrival date & time: 09/08/21  1247     History  Chief Complaint  Patient presents with   Altered Mental Status    Casondra L Fly is a 71 y.o. female.  Patient with recent admission with hemorrhagic stroke right hippocampus (was on eliquis prior to recent event), presents via EMS from SNF with altered mental status, 'pt not seeming like herself'.  EMS indicates not entirely clear from staff how long symptoms present or even if different from hospital discharge two days ago - they indicate staff there had not seen patient yesterday, but was different as compared to some time period prior to yesterday.  Pt limited historian, poorly responsive - level 5 caveat. No report of trauma or fall.  No report of fevers, but temp in ED 101, and patient was noted to have fever without source of infection during her recent admission. Pt denies pain and does not express other specific symptoms.  CBG 214.   The history is provided by the patient, medical records and the EMS personnel. The history is limited by the condition of the patient.  Altered Mental Status Associated symptoms: fever   Associated symptoms: no abdominal pain and no headaches        Home Medications Prior to Admission medications   Medication Sig Start Date End Date Taking? Authorizing Provider  acetaminophen (TYLENOL) 650 MG CR tablet Take 1,300 mg by mouth every 8 (eight) hours as needed for pain.    [provider]  ammonium lactate (LAC-HYDRIN) 12 % lotion Apply topically 2 (two) times daily. 04/13/15   Sinda Du, MD  atorvastatin (LIPITOR) 40 MG tablet Take 40 mg by mouth daily. 07/16/20   [provider]  cholecalciferol (VITAMIN D3) 25 MCG (1000 UNIT) tablet Take 1,000 Units by mouth daily.    [provider]  feeding supplement, GLUCERNA SHAKE, (GLUCERNA SHAKE) LIQD Take 237 mLs by mouth 3 (three) times daily between  meals. Patient not taking: Reported on 08/29/2021 08/24/20   Heath Lark D, DO  ferrous sulfate 300 (60 Fe) MG/5ML syrup Take 5 mLs (300 mg total) by mouth daily with breakfast. 09/07/21   Amin, Jeanella Flattery, MD  fluticasone (FLONASE) 50 MCG/ACT nasal spray Place 1 spray into both nostrils daily.    [provider]  gabapentin (NEURONTIN) 100 MG capsule Take 100 mg by mouth 3 (three) times daily. 07/19/18   [provider]  hydrALAZINE (APRESOLINE) 50 MG tablet Take 1 tablet (50 mg total) by mouth every 8 (eight) hours. 09/06/21   Amin, Jeanella Flattery, MD  lisinopril (ZESTRIL) 30 MG tablet Take 15 mg by mouth daily. 06/26/20   [provider]  metFORMIN (GLUCOPHAGE) 1000 MG tablet Take 500 mg by mouth 2 (two) times a day. 06/30/18   [provider]  mirtazapine (REMERON) 7.5 MG tablet Take 1 tablet (7.5 mg total) by mouth at bedtime. 08/24/20 09/23/20  Manuella Ghazi, Pratik D, DO  oxyCODONE-acetaminophen (PERCOCET/ROXICET) 5-325 MG tablet Take 1 tablet by mouth 2 (two) times daily as needed for up to 3 days. 09/06/21 09/09/21  Amin, Jeanella Flattery, MD  pantoprazole sodium (PROTONIX) 40 mg Take 40 mg by mouth at bedtime. 09/06/21   Amin, Jeanella Flattery, MD  senna (SENOKOT) 8.6 MG TABS tablet Take 1 tablet (8.6 mg total) by mouth daily as needed for mild constipation. Patient not taking: Reported on 08/29/2021 04/13/15   Sinda Du, MD  verapamil (CALAN) 120 MG tablet  Take 0.5 tablets (60 mg total) by mouth every 8 (eight) hours. 09/06/21   Amin, Jeanella Flattery, MD  vitamin C (ASCORBIC ACID) 500 MG tablet Take 500 mg by mouth daily.    [provider]      Allergies    Vancomycin    Review of Systems   Review of Systems  Unable to perform ROS: Mental status change  Constitutional:  Positive for fever.  Cardiovascular:  Negative for chest pain.  Gastrointestinal:  Negative for abdominal pain.  Genitourinary:  Negative for flank pain.  Musculoskeletal:  Negative for neck pain.   Neurological:  Negative for headaches.    Physical Exam Updated Vital Signs BP (!) 87/71   Pulse (!) 107   Temp (!) 101 F (38.3 C)   Resp 16   Ht 1.676 m ('5\' 6"'$ )   Wt 100.7 kg   SpO2 98%   BMI 35.83 kg/m  Physical Exam Vitals and nursing note reviewed.  Constitutional:      Appearance: Normal appearance. She is well-developed.  HENT:     Head: Atraumatic.     Nose: Nose normal.     Mouth/Throat:     Mouth: Mucous membranes are moist.  Eyes:     General: No scleral icterus.    Conjunctiva/sclera: Conjunctivae normal.     Pupils: Pupils are equal, round, and reactive to light.  Neck:     Vascular: No carotid bruit.     Trachea: No tracheal deviation.     Comments: No neck stiffness or rigidity.  Cardiovascular:     Rate and Rhythm: Normal rate and regular rhythm.     Pulses: Normal pulses.     Heart sounds: Normal heart sounds. No murmur heard.    No friction rub. No gallop.  Pulmonary:     Effort: Pulmonary effort is normal. No respiratory distress.     Breath sounds: Normal breath sounds.  Abdominal:     General: Bowel sounds are normal. There is no distension.     Palpations: Abdomen is soft. There is no mass.     Tenderness: There is abdominal tenderness. There is no guarding or rebound.     Comments: ?mild tenderness.   Genitourinary:    Comments: No cva tenderness.  Musculoskeletal:        General: No swelling or tenderness.     Cervical back: Normal range of motion and neck supple. No rigidity. No muscular tenderness.  Lymphadenopathy:     Cervical: No cervical adenopathy.  Skin:    General: Skin is warm and dry.     Findings: No rash.  Neurological:     Mental Status: She is alert.     Comments: Awake and alert appearing. Eyes open. Responds to some questions w one word answer. Follows simple commands. Grossy equal grip. Does not raise either leg off stretcher against gravity, but is able to move bilateral legs. No facial weakness appreciated.    Psychiatric:        Mood and Affect: Mood normal.     ED Results / Procedures / Treatments   Labs (all labs ordered are listed, but only abnormal results are displayed) Results for orders placed or performed during the hospital encounter of 09/08/21  CBC  Result Value Ref Range   WBC 10.1 4.0 - 10.5 K/uL   RBC 4.22 3.87 - 5.11 MIL/uL   Hemoglobin 11.2 (L) 12.0 - 15.0 g/dL   HCT 36.3 36.0 - 46.0 %   MCV 86.0  80.0 - 100.0 fL   MCH 26.5 26.0 - 34.0 pg   MCHC 30.9 30.0 - 36.0 g/dL   RDW 17.1 (H) 11.5 - 15.5 %   Platelets 249 150 - 400 K/uL   nRBC 0.0 0.0 - 0.2 %  Lactic acid, plasma  Result Value Ref Range   Lactic Acid, Venous 1.5 0.5 - 1.9 mmol/L  Comprehensive metabolic panel  Result Value Ref Range   Sodium 143 135 - 145 mmol/L   Potassium 4.3 3.5 - 5.1 mmol/L   Chloride 111 98 - 111 mmol/L   CO2 24 22 - 32 mmol/L   Glucose, Bld 121 (H) 70 - 99 mg/dL   BUN 15 8 - 23 mg/dL   Creatinine, Ser 1.43 (H) 0.44 - 1.00 mg/dL   Calcium 8.6 (L) 8.9 - 10.3 mg/dL   Total Protein 6.4 (L) 6.5 - 8.1 g/dL   Albumin 2.6 (L) 3.5 - 5.0 g/dL   AST 14 (L) 15 - 41 U/L   ALT 16 0 - 44 U/L   Alkaline Phosphatase 54 38 - 126 U/L   Total Bilirubin 0.7 0.3 - 1.2 mg/dL   GFR, Estimated 39 (L) >60 mL/min   Anion gap 8 5 - 15  Lipase, blood  Result Value Ref Range   Lipase 35 11 - 51 U/L   CT Abdomen Pelvis W Contrast  Result Date: 09/08/2021 CLINICAL DATA:  Abdominal pain EXAM: CT ABDOMEN AND PELVIS WITH CONTRAST TECHNIQUE: Multidetector CT imaging of the abdomen and pelvis was performed using the standard protocol following bolus administration of intravenous contrast. RADIATION DOSE REDUCTION: This exam was performed according to the departmental dose-optimization program which includes automated exposure control, adjustment of the mA and/or kV according to patient size and/or use of iterative reconstruction technique. CONTRAST:  73m OMNIPAQUE IOHEXOL 300 MG/ML  SOLN COMPARISON:  09/24/2001  FINDINGS: Lower chest: Coronary artery calcifications are seen. Breathing motion limits evaluation of lower lung fields. Minimal left pleural effusion is seen. Hepatobiliary: No focal abnormalities are seen in liver. Gallbladder is unremarkable. There is no dilation of bile ducts. Pancreas: No focal abnormalities are seen. Spleen: Spleen is not enlarged.  Motion artifacts limit evaluation. Adrenals/Urinary Tract: There is 2.6 cm right adrenal nodule which measures 1.8 cm in the previous study, most likely adenoma. Left adrenal is unremarkable. There is no hydronephrosis. No definite renal or ureteral stones are seen. Urinary bladder is unremarkable. Stomach/Bowel: Stomach is not distended. Small bowel loops are not dilated. Appendix is not dilated. There is no significant wall thickening in colon. There is no pericolic stranding. There is moderate amount of stool in rectum. There is mild diffuse wall thickening in the rectum. There is no perirectal stranding. Vascular/Lymphatic: Extensive arterial calcifications are seen in the major branches of abdominal aorta. Reproductive: Uterus is not seen. Other: There is no pneumoperitoneum. There is edema in subcutaneous plane. There is possible trace amount of free fluid in pelvic cavity. Musculoskeletal: There is 40% decrease in height of upper endplate of body of L3 vertebra. IMPRESSION: There is no evidence of intestinal obstruction or pneumoperitoneum. There is no hydronephrosis. Appendix is not dilated. There is mild diffuse wall thickening in rectum along with moderate amount of stool in colon. Please correlate for possible stercoral colitis. There is no significant perirectal stranding or loculated perirectal fluid collection. There is 40% decrease in height of the upper endplate of body of L3 vertebra which may suggest recent or old compression fracture. There is 2.6 cm right adrenal  nodule which measured 1.8 cm in the study done on 09/24/2001, most likely adenoma.  Severe atherosclerotic changes are noted. Other findings as described in the body of the report. Electronically Signed   By: Elmer Picker M.D.   On: 09/08/2021 15:06   DG Chest 1 View  Result Date: 09/08/2021 CLINICAL DATA:  Altered mental status. EXAM: CHEST  1 VIEW COMPARISON:  AP chest 08/29/2021 FINDINGS: Cardiac silhouette and mediastinal contours are unchanged and within normal limits with mild-to-moderate calcification within the aortic arch. Moderately decreased lung volumes. The lungs appear clear. No pleural effusion or pneumothorax. Moderate multilevel degenerative disc changes of the thoracic spine. IMPRESSION: Low lung volumes.  No acute lung process. Electronically Signed   By: Yvonne Kendall M.D.   On: 09/08/2021 14:02   CT Head Wo Contrast  Result Date: 09/08/2021 CLINICAL DATA:  Mental status change, unknown cause. Recent intracranial hemorrhage. EXAM: CT HEAD WITHOUT CONTRAST TECHNIQUE: Contiguous axial images were obtained from the base of the skull through the vertex without intravenous contrast. RADIATION DOSE REDUCTION: This exam was performed according to the departmental dose-optimization program which includes automated exposure control, adjustment of the mA and/or kV according to patient size and/or use of iterative reconstruction technique. COMPARISON:  Head CT 08/30/2021 FINDINGS: Brain: Intraparenchymal hemorrhage involving the right hippocampus has mildly decreased in density and size compared to the prior CT, now measuring 1.8 x 1.0 x 3.6 cm. As before, there is only minimal associated edema and local mass effect. Trace hemorrhage is again noted in the occipital horn of the right lateral ventricle. No new intracranial hemorrhage, acute cortically based infarct, midline shift, or extra-axial fluid collection is identified. There is mild cerebral atrophy. Confluent hypodensities in the left greater than right cerebral hemispheric white matter are unchanged and nonspecific  but compatible with extensive chronic small vessel ischemic disease. Chronic infarcts are again noted in the left corona radiata and left thalamus. Vascular: Calcified atherosclerosis at the skull base. No hyperdense vessel. Skull: No fracture or suspicious osseous lesion. Sinuses/Orbits: Visualized paranasal sinuses and mastoid air cells are clear. Unremarkable included orbits. Other: None. IMPRESSION: 1. Mildly decreased size of right hippocampal hemorrhage. 2. No new intracranial abnormality. 3. Extensive chronic small vessel ischemic disease. Electronically Signed   By: Logan Bores M.D.   On: 09/08/2021 14:02   MR ANGIO HEAD WO CONTRAST  Result Date: 08/31/2021 CLINICAL DATA:  Intraparenchymal hemorrhage involving the posterior right hippocampus. EXAM: MRA HEAD WITHOUT CONTRAST TECHNIQUE: Angiographic images of the Circle of Willis were acquired using MRA technique without intravenous contrast. COMPARISON:  None Available. FINDINGS: Anterior circulation: Atherosclerotic irregularity is present within the cavernous internal carotid arteries bilaterally. The high cervical segments are within normal limits. Moderate narrowing is present the right cavernous ICA. The supraclinoid internal carotid arteries are within normal limits bilaterally. Moderate stenosis is present the distal right A1 segment. The left A1 segment is normal. M1 segments are normal bilaterally. MCA bifurcations are intact. No vascular malformation is evident. Posterior circulation: Vertebral arteries are codominant. PICA origins are visualized and within normal limits. The vertebrobasilar junction and basilar artery is normal. Both posterior cerebral arteries originate basilar tip. Mild narrowing is present in the proximal left P2 segment and more distal right P2 segment without a significant stenosis on either side. PCA branch vessels are within normal limits. Anatomic variants: None Other: None. IMPRESSION: 1. Moderate narrowing of the  right cavernous ICA. 2. Moderate stenosis of the distal right A1 segment. 3. Mild narrowing of  the proximal left P2 segment and more distal right P2 segment without a significant stenosis on either side. 4. No focal lesion or vascular malformation to explain the hemorrhage. Electronically Signed   By: San Morelle M.D.   On: 08/31/2021 12:54   ECHOCARDIOGRAM COMPLETE  Result Date: 08/30/2021    ECHOCARDIOGRAM REPORT   Patient Name:   LEVORA WERDEN Cherry Date of Exam: 08/30/2021 Medical Rec #:  [id number]     Height:       66.0 in Accession #:    [id number]    Weight:       222.7 lb Date of Birth:  07-20-1950      BSA:          2.093 m Patient Age:    42 years      BP:           124/90 mmHg Patient Gender: F             HR:           110 bpm. Exam Location:  Inpatient Procedure: 2D Echo, Cardiac Doppler and Color Doppler Indications:    Stroke  History:        Patient has prior history of Echocardiogram examinations, most                 recent 08/26/2020. Arrythmias:Tachycardia,                 Signs/Symptoms:Shortness of Breath and Altered Mental Status;                 Risk Factors:Morbid obesity.  Sonographer:    Merrie Roof RDCS Referring Phys: [id number] ASHISH ARORA IMPRESSIONS  1. Left ventricular ejection fraction, by estimation, is >75%. The left ventricle has hyperdynamic function. The left ventricle has no regional wall motion abnormalities. There is mild concentric left ventricular hypertrophy. Left ventricular diastolic parameters are consistent with Grade I diastolic dysfunction (impaired relaxation).  2. Right ventricular systolic function is hyperdynamic. The right ventricular size is normal. Tricuspid regurgitation signal is inadequate for assessing PA pressure.  3. Left atrial size was severely dilated.  4. The mitral valve is normal in structure. No evidence of mitral valve regurgitation. No evidence of mitral stenosis.  5. The aortic valve is tricuspid. There is mild calcification of the aortic  valve. There is mild thickening of the aortic valve. Aortic valve regurgitation is not visualized. Aortic valve sclerosis/calcification is present, without any evidence of aortic stenosis.  6. The inferior vena cava is collapsed, consistent with low right atrial pressures. Conclusion(s)/Recommendation(s): The study suggests hypovolemia. FINDINGS  Left Ventricle: There is mid-cavity obliteration with an intracavitary "gradient" of 3.8 m/s. Left ventricular ejection fraction, by estimation, is >75%. The left ventricle has hyperdynamic function. The left ventricle has no regional wall motion abnormalities. The left ventricular internal cavity size was normal in size. There is mild concentric left ventricular hypertrophy. Left ventricular diastolic parameters are consistent with Grade I diastolic dysfunction (impaired relaxation). Indeterminate filling pressures. Right Ventricle: The right ventricular size is normal. No increase in right ventricular wall thickness. Right ventricular systolic function is hyperdynamic. Tricuspid regurgitation signal is inadequate for assessing PA pressure. Left Atrium: Left atrial size was severely dilated. Right Atrium: Right atrial size was normal in size. Pericardium: There is no evidence of pericardial effusion. Mitral Valve: The mitral valve is normal in structure. Mild mitral annular calcification. No evidence of mitral valve regurgitation. No evidence of mitral valve stenosis. Tricuspid Valve: The tricuspid  valve is grossly normal. Tricuspid valve regurgitation is not demonstrated. Aortic Valve: Gradients are exaggerated by the hyperdynamic left ventricular contractility. The aortic valve is tricuspid. There is mild calcification of the aortic valve. There is mild thickening of the aortic valve. Aortic valve regurgitation is not visualized. Aortic valve sclerosis/calcification is present, without any evidence of aortic stenosis. Aortic valve mean gradient measures 11.4 mmHg. Aortic  valve peak gradient measures 24.3 mmHg. Aortic valve area, by VTI measures 2.03 cm. Pulmonic Valve: The pulmonic valve was grossly normal. Pulmonic valve regurgitation is not visualized. Aorta: The aortic root and ascending aorta are structurally normal, with no evidence of dilitation. Venous  IAS/Shunts: The interatrial septum was not well visualized.  LEFT VENTRICLE PLAX 2D LVIDd:         4.40 cm   Diastology LVIDs:         2.50 cm   LV e' medial:    5.44 cm/s LV PW:         1.20 cm   LV E/e' medial:  14.4 LV IVS:        1.20 cm   LV e' lateral:   8.55 cm/s LVOT diam:     2.20 cm   LV E/e' lateral: 9.2 LV SV:         70 LV SV Index:   33 LVOT Area:     3.80 cm  RIGHT VENTRICLE RV Basal diam:  2.90 cm LEFT ATRIUM              Index        RIGHT ATRIUM           Index LA diam:        5.00 cm  2.39 cm/m   RA Area:     10.80 cm LA Vol (A2C):   65.2 ml  31.15 ml/m  RA Volume:   20.10 ml  9.60 ml/m LA Vol (A4C):   113.0 ml 53.98 ml/m LA Biplane Vol: 85.9 ml  41.04 ml/m  AORTIC VALVE AV Area (Vmax):    2.05 cm AV Area (Vmean):   2.19 cm AV Area (VTI):     2.03 cm AV Vmax:           246.50 cm/s AV Vmean:          151.912 cm/s AV VTI:            0.345 m AV Peak Grad:      24.3 mmHg AV Mean Grad:      11.4 mmHg LVOT Vmax:         133.00 cm/s LVOT Vmean:        87.400 cm/s LVOT VTI:          0.184 m LVOT/AV VTI ratio: 0.53  AORTA Ao Root diam: 3.20 cm Ao Asc diam:  2.70 cm MITRAL VALVE MV Area (PHT): 4.80 cm     SHUNTS MV Decel Time: 158 msec     Systemic VTI:  0.18 m MV E velocity: 78.60 cm/s   Systemic Diam: 2.20 cm MV A velocity: 134.00 cm/s MV E/A ratio:  0.59 Mihai Croitoru MD Electronically signed by Sanda Klein MD Signature Date/Time: 08/30/2021/6:06:37 PM    Final    EEG adult  Result Date: 08/30/2021 Lora Havens, MD     08/30/2021 10:09 AM Patient Name: KELSEA MOUSEL MRN: [id number] Epilepsy Attending: Lora Havens Referring Physician/Provider: Amie Portland, MD Date: 08/29/2021 Duration:  23.09 mins Patient history: 71 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 EEG to evaluate for seizure Level of alertness: lethargic AEDs during EEG study: None Technical aspects: This EEG study was done with scalp electrodes positioned according to the 10-20 International system of electrode placement. Electrical activity was acquired at a sampling rate of '500Hz'$  and reviewed with a high frequency filter of '70Hz'$  and a low frequency filter of '1Hz'$ . EEG data were recorded continuously and digitally stored. Description: No clear posterior dominant rhythm was seen. EEG showed continuous 4 to 5 Hz theta slowing in left hemisphere as well as low amplitude 2 to 3 Hz delta slowing in right hemisphere.  Hyperventilation and photic stimulation were not performed.   ABNORMALITY -Continuous slow, generalized and lateralized right hemisphere IMPRESSION: This study is suggestive of cortical dysfunction in right hemisphere likely secondary to underlying structural abnormality/stroke.  Additionally there is moderate diffuse encephalopathy, nonspecific etiology.  No seizures or  epileptiform discharges were seen during the study. [place]   CT HEAD WO CONTRAST (5MM)  Result Date: 08/30/2021 CLINICAL DATA:  71 year old female with altered mental status and acute hemorrhage of the right hippocampus yesterday. EXAM: CT HEAD WITHOUT CONTRAST TECHNIQUE: Contiguous axial images were obtained from the base of the skull through the vertex without intravenous contrast. RADIATION DOSE REDUCTION: This exam was performed according to the departmental dose-optimization program which includes automated exposure control, adjustment of the mA and/or kV according to patient size and/or use of iterative reconstruction technique. COMPARISON:  Head CT 08/29/2021 and earlier. FINDINGS: Brain: Intra-axial hyperdense hemorrhage which mostly conforms to the right hippocampus encompasses  42 by 23 x 13 mm (AP by transverse by CC) for an estimated blood volume of 6 mL (stable). Only minor regional edema and mass effect. Questionable trace intraventricular extension to the right occipital horn is stable. No ventriculomegaly. No other extra-axial extension identified. Extensive underlying chronic small vessel disease in the bilateral cerebral white matter, left deep gray nuclei. No cortically based acute infarct identified. Vascular: Extensive Calcified atherosclerosis at the skull base. No suspicious intracranial vascular hyperdensity. Skull: Stable and intact. Sinuses/Orbits: Visualized paranasal sinuses and mastoids are clear. Other: Calcified scalp vessel atherosclerosis. No acute orbit or scalp soft tissue finding. IMPRESSION: 1. Stable hemorrhage of the right hippocampus, 6-7 mL. Minor regional edema and no significant mass effect. Questionable trace intraventricular extension of blood is stable with no ventriculomegaly. 2. No new intracranial abnormality. Underlying advanced small vessel disease, severe calcified atherosclerosis. Electronically Signed   By: Genevie Ann M.D.   On: 08/30/2021 07:11   MR BRAIN W WO CONTRAST  Result Date: 08/30/2021 CLINICAL DATA:  Follow-up examination for hemorrhagic stroke. EXAM: MRI HEAD WITHOUT AND WITH CONTRAST TECHNIQUE: Multiplanar, multiecho pulse sequences of the brain and surrounding structures were obtained without and with intravenous contrast. CONTRAST:  26m GADAVIST GADOBUTROL 1 MMOL/ML IV SOLN COMPARISON:  Prior CT from earlier the same day as well as previous brain MRI from 07/20/2020. FINDINGS: Brain: Examination degraded by motion artifact, limiting assessment. Generalized age-related cerebral atrophy. Patchy and confluent T2/FLAIR hyperintensity involving the periventricular and deep white matter both cerebral hemispheres as well as the pons, most consistent with chronic small vessel ischemic disease, moderately advanced in nature. Remote lacunar  infarcts noted at the left corona radiata, bilateral basal ganglia, and thalami. Previously identified intraparenchymal hemorrhage centered at the posterior right hippocampal formation again seen, grossly similar in size and morphology as compared to previous exam. Mild surrounding edema with partial  effacement of the adjacent right lateral ventricle. No visible underlying lesion or abnormal enhancement seen on this motion degraded exam. Trace layering blood noted within the occipital horn of the left lateral ventricle, which could be related to trace intraventricular extension and/or subarachnoid penetration with redistribution. No other visible acute intracranial hemorrhage. Multiple additional scattered chronic micro hemorrhages noted about the cerebellum, brainstem, and thalami, likely related to chronic poorly controlled hypertension. No mass lesion or significant midline shift. No hydrocephalus or extra-axial fluid collection. Pituitary gland and suprasellar region within normal limits. No abnormal enhancement. Vascular: Major intracranial vascular flow voids are grossly maintained at the skull base. Skull and upper cervical spine: Craniocervical junction normal limits. Bone marrow signal intensity within normal limits. No scalp soft tissue abnormality. Sinuses/Orbits: Globes and orbital soft tissues demonstrate no acute finding. Paranasal sinuses are largely clear. Trace left mastoid effusion, of doubtful significance. Other: None. IMPRESSION: 1. Motion degraded exam. 2. No significant interval change in intraparenchymal hemorrhage centered at the posterior right hippocampal formation. No visible underlying lesion or abnormal enhancement on this motion degraded exam. 3. Trace layering blood within the occipital horn of the left lateral ventricle, which could be related to trace intraventricular extension and/or subarachnoid penetration with redistribution. No hydrocephalus. 4. Multiple additional scattered  chronic micro hemorrhages clustered about the cerebellum, brainstem, and thalami, most characteristic of chronic poorly controlled hypertension. 5. Underlying atrophy with moderate chronic small vessel ischemic disease, with a few additional remote lacunar infarcts as above. Electronically Signed   By: Jeannine Boga M.D.   On: 08/30/2021 00:46   CT Angio Chest PE W and/or Wo Contrast  Result Date: 08/29/2021 CLINICAL DATA:  Pulmonary embolism suspected, high probability. Mental status changes. EXAM: CT ANGIOGRAPHY CHEST WITH CONTRAST TECHNIQUE: Multidetector CT imaging of the chest was performed using the standard protocol during bolus administration of intravenous contrast. Multiplanar CT image reconstructions and MIPs were obtained to evaluate the vascular anatomy. RADIATION DOSE REDUCTION: This exam was performed according to the departmental dose-optimization program which includes automated exposure control, adjustment of the mA and/or kV according to patient size and/or use of iterative reconstruction technique. CONTRAST:  71m OMNIPAQUE IOHEXOL 350 MG/ML SOLN COMPARISON:  Chest radiography same day FINDINGS: Cardiovascular: Pulmonary arterial opacification is good. No pulmonary emboli. Cardiomegaly. Coronary artery calcification. Aortic atherosclerotic calcification without aneurysm or dissection. Mediastinum/Nodes: No mediastinal or hilar mass or lymphadenopathy. Lungs/Pleura: Mild dependent atelectasis. Very tiny effusions. No pneumonia or mass. Upper Abdomen: Negative Musculoskeletal: No acute musculoskeletal finding. Review of the MIP images confirms the above findings. IMPRESSION: No pulmonary emboli. Minimal dependent pulmonary atelectasis, otherwise no active chest disease. Coronary artery calcification. Aortic atherosclerotic calcification. Electronically Signed   By: MNelson ChimesM.D.   On: 08/29/2021 15:28   CT Head Wo Contrast  Result Date: 08/29/2021 CLINICAL DATA:  Mental status  change of unknown cause EXAM: CT HEAD WITHOUT CONTRAST TECHNIQUE: Contiguous axial images were obtained from the base of the skull through the vertex without intravenous contrast. RADIATION DOSE REDUCTION: This exam was performed according to the departmental dose-optimization program which includes automated exposure control, adjustment of the mA and/or kV according to patient size and/or use of iterative reconstruction technique. COMPARISON:  08/24/2020 FINDINGS: Brain: Hemorrhage within the hippocampus on the right measuring 4.3 x 2.1 x 1.6 cm (volume = 7.6 cm^3). This is most consistent with hemorrhagic infarction. No intraventricular penetration. No subarachnoid penetration. Elsewhere, the brain shows atrophy and chronic small-vessel ischemic change as seen previously. No hydrocephalus or extra-axial  collection. Vascular: There is atherosclerotic calcification of the major vessels at the base of the brain. 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 No intraventricular or subarachnoid penetration. Extensive chronic small-vessel ischemic changes seen elsewhere throughout the brain. Critical Value/emergent results were called by telephone at the time of interpretation on 08/29/2021 at 3:26 pm to provider JULIE HAVILAND , who verbally acknowledged these results. Electronically Signed   By: Nelson Chimes M.D.   On: 08/29/2021 15:26   DG Chest Port 1 View  Result Date: 08/29/2021 CLINICAL DATA:  71 year old female with questionable sepsis EXAM: PORTABLE CHEST 1 VIEW COMPARISON:  08/20/2020 FINDINGS: Cardiomediastinal silhouette unchanged in size and contour. No evidence of central vascular congestion. No interlobular septal thickening. Persisting low lung volumes No pneumothorax or pleural effusion. Coarsened interstitial markings, with no confluent airspace disease. No acute displaced  fracture. IMPRESSION: Low lung volumes with no definite acute cardiopulmonary disease Electronically Signed   By: Corrie Mckusick D.O.   On: 08/29/2021 12:12      EKG EKG Interpretation  Date/Time:  Thursday September 08 2021 13:00:06 EDT Ventricular Rate:  101 PR Interval:  131 QRS Duration: 82 QT Interval:  354 QTC Calculation: 459 R Axis:   -24 Text Interpretation: Sinus tachycardia Atrial premature complex Non-specific ST-t changes Confirmed by Lajean Saver [phone]) on 09/08/2021 3:47:27 PM  Radiology CT Abdomen Pelvis W Contrast  Result Date: 09/08/2021 CLINICAL DATA:  Abdominal pain EXAM: CT ABDOMEN AND PELVIS WITH CONTRAST TECHNIQUE: Multidetector CT imaging of the abdomen and pelvis was performed using the standard protocol following bolus administration of intravenous contrast. RADIATION DOSE REDUCTION: This exam was performed according to the departmental dose-optimization program which includes automated exposure control, adjustment of the mA and/or kV according to patient size and/or use of iterative reconstruction technique. CONTRAST:  73m OMNIPAQUE IOHEXOL 300 MG/ML  SOLN COMPARISON:  09/24/2001 FINDINGS: Lower chest: Coronary artery calcifications are seen. Breathing motion limits evaluation of lower lung fields. Minimal left pleural effusion is seen. Hepatobiliary: No focal abnormalities are seen in liver. Gallbladder is unremarkable. There is no dilation of bile ducts. Pancreas: No focal abnormalities are seen. Spleen: Spleen is not enlarged.  Motion artifacts limit evaluation. Adrenals/Urinary Tract: There is 2.6 cm right adrenal nodule which measures 1.8 cm in the previous study, most likely adenoma. Left adrenal is unremarkable. There is no hydronephrosis. No definite renal or ureteral stones are seen. Urinary bladder is unremarkable. Stomach/Bowel: Stomach is not distended. Small bowel loops are not dilated. Appendix is not dilated. There is no significant wall thickening in colon.  There is no pericolic stranding. There is moderate amount of stool in rectum. There is mild diffuse wall thickening in the rectum. There is no perirectal stranding. Vascular/Lymphatic: Extensive arterial calcifications are seen in the major branches of abdominal aorta. Reproductive: Uterus is not seen. Other: There is no pneumoperitoneum. There is edema in subcutaneous plane. There is possible trace amount of free fluid in pelvic cavity. Musculoskeletal: There is 40% decrease in height of upper endplate of body of L3 vertebra. IMPRESSION: There is no evidence of intestinal obstruction or pneumoperitoneum. There is no hydronephrosis. Appendix is not dilated. There is mild diffuse wall thickening in rectum along with moderate amount of stool in colon. Please correlate for possible stercoral colitis. There is no significant perirectal stranding or loculated perirectal fluid collection. There is 40% decrease in height of the upper endplate of body of L3  vertebra which may suggest recent or old compression fracture. There is 2.6 cm right adrenal nodule which measured 1.8 cm in the study done on 09/24/2001, most likely adenoma. Severe atherosclerotic changes are noted. Other findings as described in the body of the report. Electronically Signed   By: Elmer Picker M.D.   On: 09/08/2021 15:06   DG Chest 1 View  Result Date: 09/08/2021 CLINICAL DATA:  Altered mental status. EXAM: CHEST  1 VIEW COMPARISON:  AP chest 08/29/2021 FINDINGS: Cardiac silhouette and mediastinal contours are unchanged and within normal limits with mild-to-moderate calcification within the aortic arch. Moderately decreased lung volumes. The lungs appear clear. No pleural effusion or pneumothorax. Moderate multilevel degenerative disc changes of the thoracic spine. IMPRESSION: Low lung volumes.  No acute lung process. Electronically Signed   By: Yvonne Kendall M.D.   On: 09/08/2021 14:02   CT Head Wo Contrast  Result Date:  09/08/2021 CLINICAL DATA:  Mental status change, unknown cause. Recent intracranial hemorrhage. EXAM: CT HEAD WITHOUT CONTRAST TECHNIQUE: Contiguous axial images were obtained from the base of the skull through the vertex without intravenous contrast. RADIATION DOSE REDUCTION: This exam was performed according to the departmental dose-optimization program which includes automated exposure control, adjustment of the mA and/or kV according to patient size and/or use of iterative reconstruction technique. COMPARISON:  Head CT 08/30/2021 FINDINGS: Brain: Intraparenchymal hemorrhage involving the right hippocampus has mildly decreased in density and size compared to the prior CT, now measuring 1.8 x 1.0 x 3.6 cm. As before, there is only minimal associated edema and local mass effect. Trace hemorrhage is again noted in the occipital horn of the right lateral ventricle. No new intracranial hemorrhage, acute cortically based infarct, midline shift, or extra-axial fluid collection is identified. There is mild cerebral atrophy. Confluent hypodensities in the left greater than right cerebral hemispheric white matter are unchanged and nonspecific but compatible with extensive chronic small vessel ischemic disease. Chronic infarcts are again noted in the left corona radiata and left thalamus. Vascular: Calcified atherosclerosis at the skull base. No hyperdense vessel. Skull: No fracture or suspicious osseous lesion. Sinuses/Orbits: Visualized paranasal sinuses and mastoid air cells are clear. Unremarkable included orbits. Other: None. IMPRESSION: 1. Mildly decreased size of right hippocampal hemorrhage. 2. No new intracranial abnormality. 3. Extensive chronic small vessel ischemic disease. Electronically Signed   By: Logan Bores M.D.   On: 09/08/2021 14:02    Procedures Procedures    Medications Ordered in ED Medications  acetaminophen (TYLENOL) tablet 650 mg (has no administration in time range)  iohexol (OMNIPAQUE)  300 MG/ML solution 75 mL (75 mLs Intravenous Contrast Given 09/08/21 1436)  sodium chloride 0.9 % bolus 1,000 mL (1,000 mLs Intravenous New Bag/Given (Non-Interop) 09/08/21 1518)    ED Course/ Medical Decision Making/ A&P                           Medical Decision Making Problems Addressed: Acute alteration in mental status: acute illness or injury with systemic symptoms that poses a threat to life or bodily functions Acute febrile illness: acute illness or injury with systemic symptoms that poses a threat to life or bodily functions Dehydration: acute illness or injury  Amount and/or Complexity of Data Reviewed Independent Historian:     Details: family, hx External Data Reviewed: labs, radiology and notes. Labs: ordered. Decision-making details documented in ED Course. Radiology: ordered and independent interpretation performed. Decision-making details documented in ED Course. ECG/medicine tests: ordered.  Risk OTC drugs. Prescription drug management. Decision regarding hospitalization.  Iv ns. Continuous pulse ox and cardiac monitoring. Labs ordered/sent. Imaging ordered.   Diff dx includes new/increased hem, infection, dehydration, etc - dispo decision including potential need for admission considered - will get labs and imaging and reassess.   Reviewed nursing notes and prior charts for additional history. External reports reviewed. During prior hospitalization, pt noted w fevers, lactate was normal, cultures were negative. Additional history from: EMS.   Cardiac monitor: sinus rhythm, rate 94.  Labs reviewed/interpreted by me - lactate normal. Wbc normal. Chem normal, cr sl increased from prior - family/son notes recent poor po intake ?dehydration, ns bolus.   Xrays reviewed/interpreted by me - no def pna.   CT head reviewed/interpreted by me - no new or worsening hemorrhage.   CT abd/pelvic pending.   During recent admission and on current exam, source fever not  entirely clear. Today, UA and CT abd/pelvis is pending. On exam, breathing comfortably, no increased wob, pulse ox 96%. No neck stiffness or rigidity on exam, moves neck comfortably/freely in all directions. Abd w mild tenderness, no change from initial exam. No extremity redness, swelling, ulcer or lesion.   1540 signed out to oncoming EDP, Dr Almyra Free, to check pending labs and ct and dispo appropriately.    Final Clinical Impression(s) / ED Diagnoses Final diagnoses:  Acute alteration in mental status  Acute febrile illness  Dehydration  Adrenal nodule (HCC)  Closed compression fracture of L3 vertebra, initial encounter ([place])  Other constipation    Rx / DC Orders ED Discharge Orders     None            Lajean Saver, MD 09/08/21 [phone]

## 2021-09-09 LAB — URINE CULTURE: Culture: <10000 — AB

## 2021-09-13 LAB — CULTURE, BLOOD (ROUTINE X 2)
Culture: NO GROWTH
Culture: NO GROWTH
Special Requests: ADEQUATE
Special Requests: ADEQUATE

## 2021-09-27 IMAGING — CT CT HEAD W/O CM
3 series · 16 of 47 positions shown, 19 images · non-contrast
Comparison: MR brain dated 07/20/2020

CLINICAL DATA: Altered mental status

EXAM:
CT HEAD WITHOUT CONTRAST
TECHNIQUE: Contiguous axial images were obtained from the base of the skull
through the vertex without intravenous contrast.

[Series 2: head w o · axial · 0.44mm/px · z∈[+1279,+1404]mm · 10 of 31 slices shown, 13 images]
[im 3/31  brain]
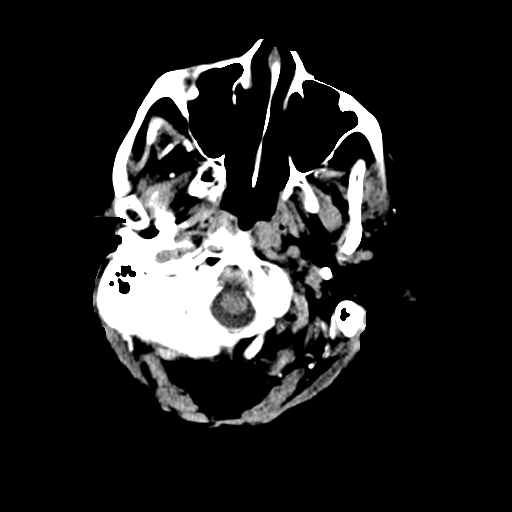
[im 3/31  bone]
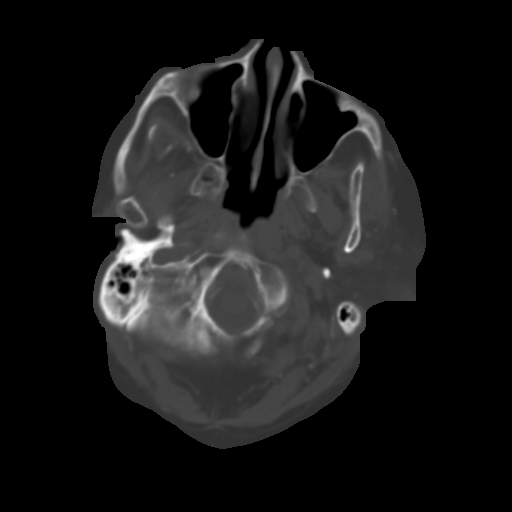
[im 6/31  brain]
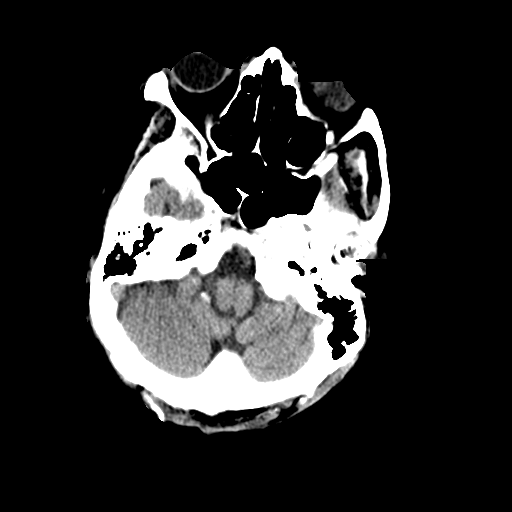
[im 9/31  brain]
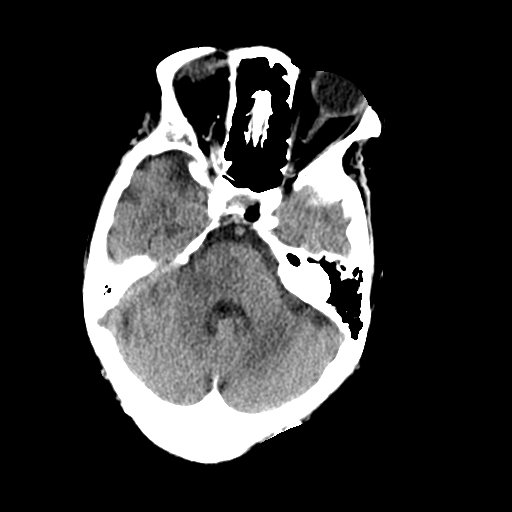
[im 11/31  brain]
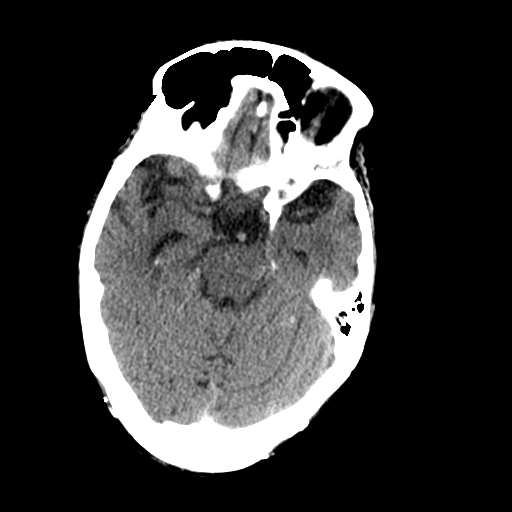
[im 14/31  brain]
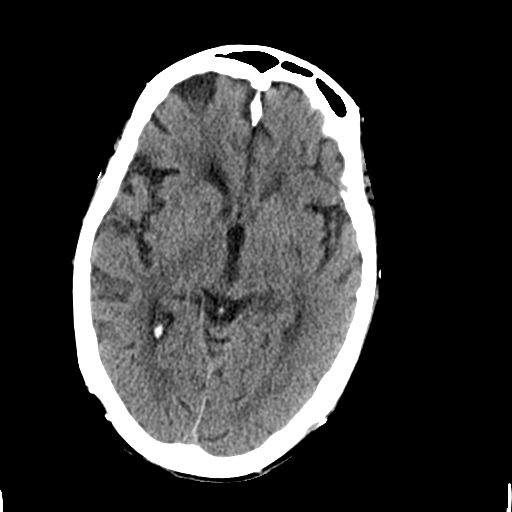
[im 14/31  bone]
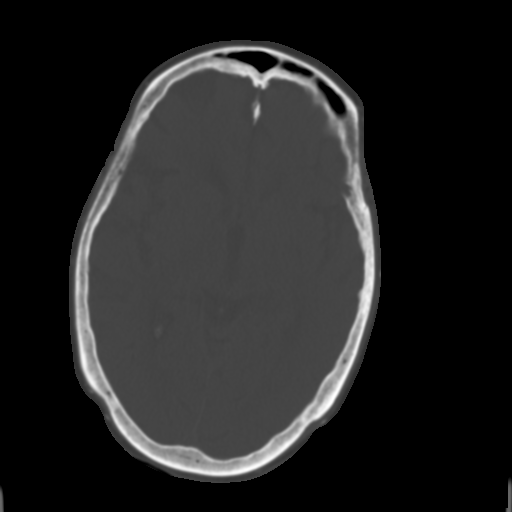
[im 17/31  brain]
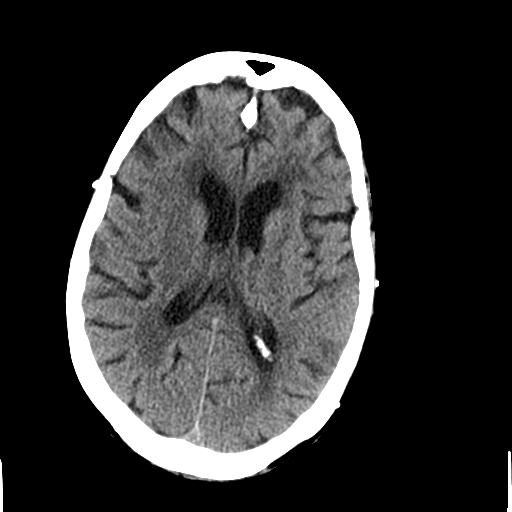
[im 20/31  brain]
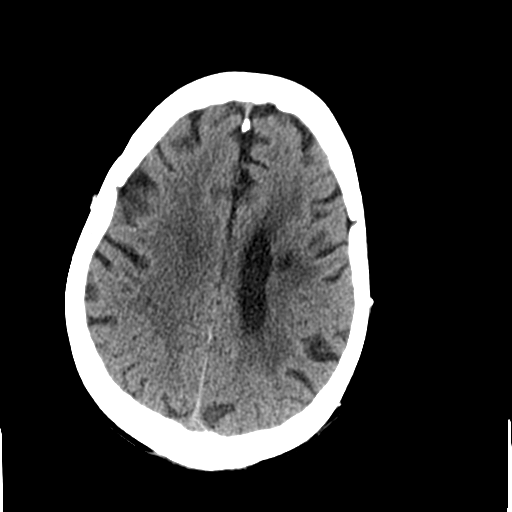
[im 23/31  brain]
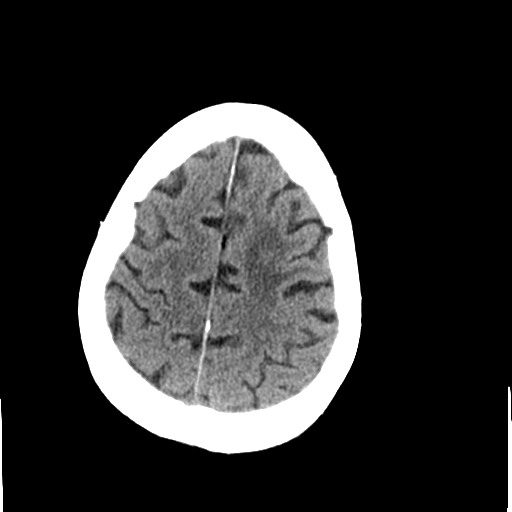
[im 25/31  brain]
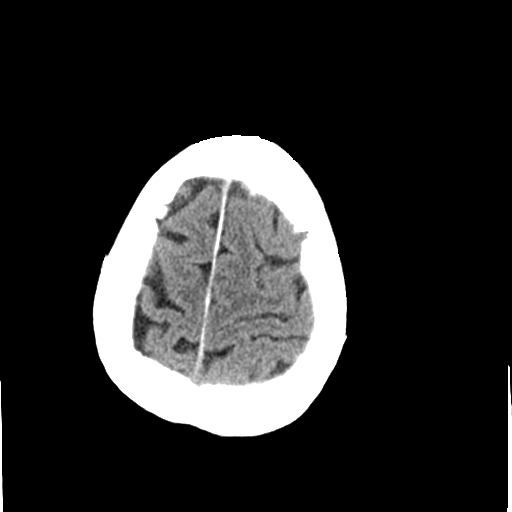
[im 25/31  bone]
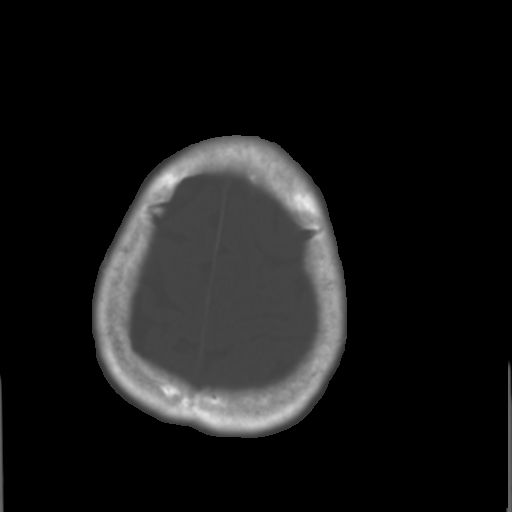
[im 28/31  brain]
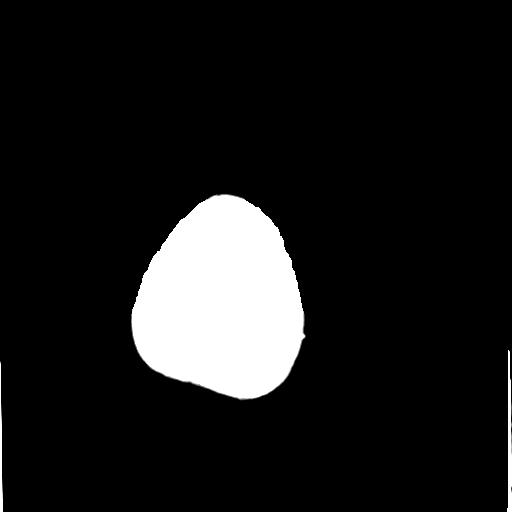

[Series 4: coronal soft · coronal · 0.29mm/px · 3 of 68 slices shown]
[im 23/68  brain]
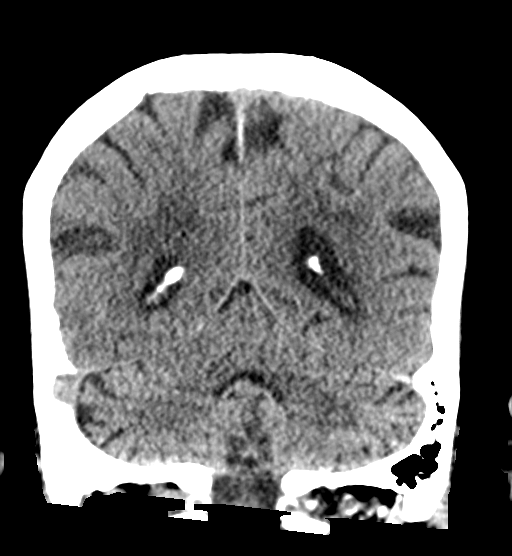
[im 30/68  brain]
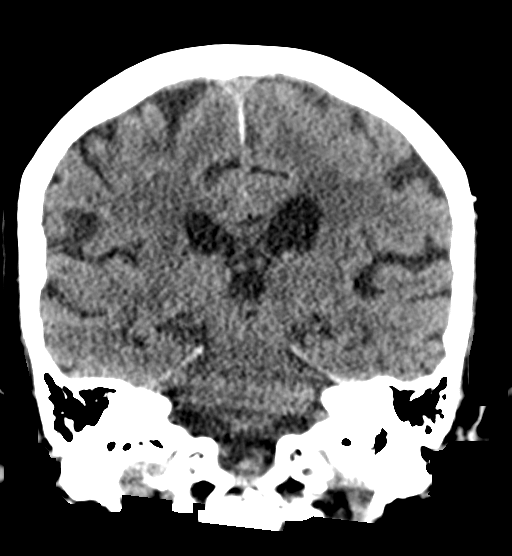
[im 38/68  brain]
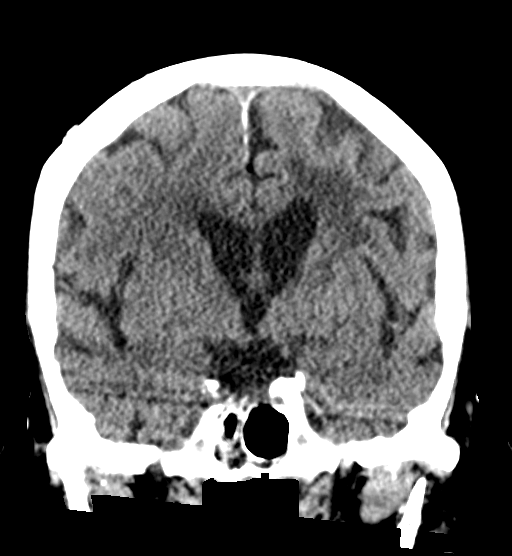

[Series 5: sagittal soft · sagittal · 0.31mm/px · 3 of 54 slices shown]
[im 18/54  brain]
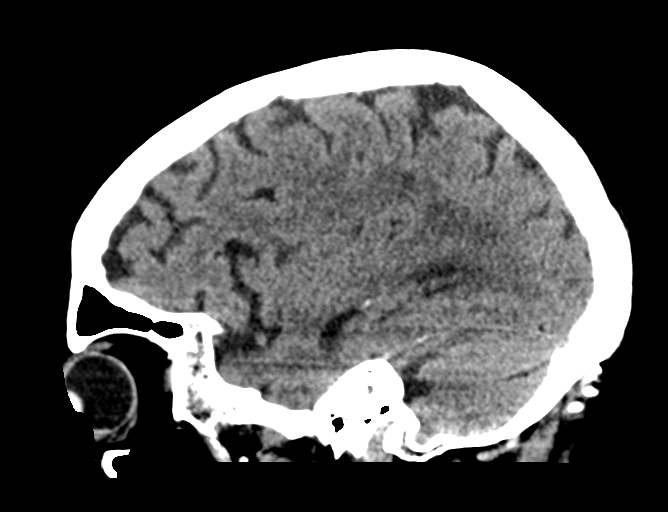
[im 27/54  brain]
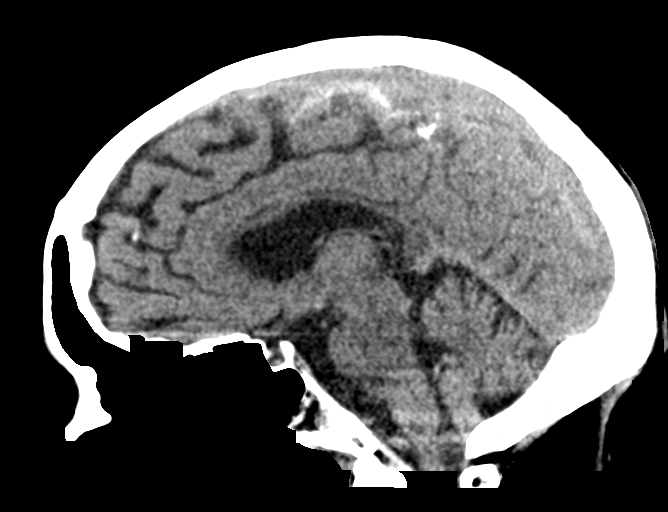
[im 36/54  brain]
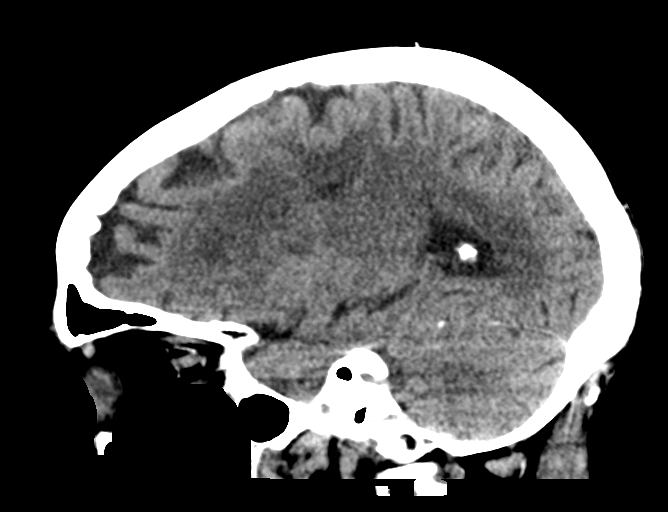

[16 of 47 positions shown; findings below may reference images not displayed]

FINDINGS: Brain: No evidence of acute infarction, hemorrhage, hydrocephalus,
extra-axial collection or mass lesion/mass effect.

Old left basal ganglia lacunar infarct.

Subcortical white matter and periventricular small vessel ischemic
changes.

Vascular: Intracranial atherosclerosis.

Skull: Normal. Negative for fracture or focal lesion.

Sinuses/Orbits: The visualized paranasal sinuses are essentially
clear. The mastoid air cells are unopacified.

Other: None.
IMPRESSION: No evidence of acute intracranial abnormality. Old left basal
ganglia lacunar infarct. Small vessel ischemic changes.

## 2021-10-06 ENCOUNTER — Inpatient Hospital Stay (HOSPITAL_COMMUNITY)
Admission: EM | Admit: 2021-10-06 | Discharge: 2021-10-10 | DRG: 070 | Disposition: A | Discharge status: Skilled Nursing Facility | Payer: Medicare (Managed Care) | Source: Skilled Nursing Facility | Attending: Family Medicine | Admitting: Family Medicine

## 2021-10-06 ENCOUNTER — Emergency Department (HOSPITAL_COMMUNITY): Payer: Medicare (Managed Care)

## 2021-10-06 ENCOUNTER — Other Ambulatory Visit: Payer: Self-pay

## 2021-10-06 ENCOUNTER — Encounter (HOSPITAL_COMMUNITY): Payer: Self-pay

## 2021-10-06 DIAGNOSIS — Z8614 Personal history of Methicillin resistant Staphylococcus aureus infection: Secondary | ICD-10-CM

## 2021-10-06 DIAGNOSIS — Z6835 Body mass index (BMI) 35.0-35.9, adult: Secondary | ICD-10-CM

## 2021-10-06 DIAGNOSIS — Z833 Family history of diabetes mellitus: Secondary | ICD-10-CM

## 2021-10-06 DIAGNOSIS — E875 Hyperkalemia: Secondary | ICD-10-CM | POA: Diagnosis present

## 2021-10-06 DIAGNOSIS — Z8673 Personal history of transient ischemic attack (TIA), and cerebral infarction without residual deficits: Secondary | ICD-10-CM

## 2021-10-06 DIAGNOSIS — R4701 Aphasia: Secondary | ICD-10-CM | POA: Diagnosis present

## 2021-10-06 DIAGNOSIS — E1122 Type 2 diabetes mellitus with diabetic chronic kidney disease: Secondary | ICD-10-CM | POA: Diagnosis present

## 2021-10-06 DIAGNOSIS — N390 Urinary tract infection, site not specified: Secondary | ICD-10-CM | POA: Diagnosis present

## 2021-10-06 DIAGNOSIS — Z86711 Personal history of pulmonary embolism: Secondary | ICD-10-CM | POA: Diagnosis present

## 2021-10-06 DIAGNOSIS — I639 Cerebral infarction, unspecified: Secondary | ICD-10-CM | POA: Diagnosis present

## 2021-10-06 DIAGNOSIS — E119 Type 2 diabetes mellitus without complications: Secondary | ICD-10-CM

## 2021-10-06 DIAGNOSIS — Z8249 Family history of ischemic heart disease and other diseases of the circulatory system: Secondary | ICD-10-CM

## 2021-10-06 DIAGNOSIS — Z7984 Long term (current) use of oral hypoglycemic drugs: Secondary | ICD-10-CM

## 2021-10-06 DIAGNOSIS — D649 Anemia, unspecified: Secondary | ICD-10-CM | POA: Diagnosis present

## 2021-10-06 DIAGNOSIS — I82403 Acute embolism and thrombosis of unspecified deep veins of lower extremity, bilateral: Secondary | ICD-10-CM | POA: Diagnosis present

## 2021-10-06 DIAGNOSIS — G9341 Metabolic encephalopathy: Principal | ICD-10-CM | POA: Diagnosis present

## 2021-10-06 DIAGNOSIS — Z20822 Contact with and (suspected) exposure to covid-19: Secondary | ICD-10-CM | POA: Diagnosis present

## 2021-10-06 DIAGNOSIS — L89892 Pressure ulcer of other site, stage 2: Secondary | ICD-10-CM | POA: Diagnosis present

## 2021-10-06 DIAGNOSIS — N1832 Chronic kidney disease, stage 3b: Secondary | ICD-10-CM | POA: Diagnosis present

## 2021-10-06 DIAGNOSIS — Z993 Dependence on wheelchair: Secondary | ICD-10-CM

## 2021-10-06 DIAGNOSIS — I130 Hypertensive heart and chronic kidney disease with heart failure and stage 1 through stage 4 chronic kidney disease, or unspecified chronic kidney disease: Secondary | ICD-10-CM | POA: Diagnosis present

## 2021-10-06 DIAGNOSIS — Z66 Do not resuscitate: Secondary | ICD-10-CM | POA: Diagnosis present

## 2021-10-06 DIAGNOSIS — Z79899 Other long term (current) drug therapy: Secondary | ICD-10-CM

## 2021-10-06 DIAGNOSIS — R4182 Altered mental status, unspecified: Secondary | ICD-10-CM | POA: Diagnosis present

## 2021-10-06 DIAGNOSIS — I6340 Cerebral infarction due to embolism of unspecified cerebral artery: Secondary | ICD-10-CM | POA: Diagnosis not present

## 2021-10-06 DIAGNOSIS — R29710 NIHSS score 10: Secondary | ICD-10-CM | POA: Diagnosis not present

## 2021-10-06 DIAGNOSIS — E860 Dehydration: Secondary | ICD-10-CM | POA: Diagnosis present

## 2021-10-06 DIAGNOSIS — E785 Hyperlipidemia, unspecified: Secondary | ICD-10-CM | POA: Diagnosis present

## 2021-10-06 DIAGNOSIS — A0472 Enterocolitis due to Clostridium difficile, not specified as recurrent: Secondary | ICD-10-CM | POA: Diagnosis present

## 2021-10-06 DIAGNOSIS — Z90711 Acquired absence of uterus with remaining cervical stump: Secondary | ICD-10-CM

## 2021-10-06 DIAGNOSIS — D3501 Benign neoplasm of right adrenal gland: Secondary | ICD-10-CM | POA: Diagnosis present

## 2021-10-06 DIAGNOSIS — Z7901 Long term (current) use of anticoagulants: Secondary | ICD-10-CM

## 2021-10-06 DIAGNOSIS — I82409 Acute embolism and thrombosis of unspecified deep veins of unspecified lower extremity: Secondary | ICD-10-CM | POA: Clinically undetermined

## 2021-10-06 DIAGNOSIS — I5032 Chronic diastolic (congestive) heart failure: Secondary | ICD-10-CM | POA: Diagnosis present

## 2021-10-06 LAB — URINALYSIS, ROUTINE W REFLEX MICROSCOPIC
Bilirubin Urine: NEGATIVE
Glucose, UA: NEGATIVE mg/dL
Hgb urine dipstick: NEGATIVE
Ketones, ur: NEGATIVE mg/dL
Nitrite: NEGATIVE
Protein, ur: 100 mg/dL — AB
Specific Gravity, Urine: 1.011 (ref 1.005–1.030)
WBC, UA: >50 WBC/hpf — ABNORMAL HIGH (ref 0–5)
pH: 6 (ref 5.0–8.0)

## 2021-10-06 LAB — CBC WITH DIFFERENTIAL/PLATELET
Abs Immature Granulocytes: 0.06 10*3/uL (ref 0.00–0.07)
Basophils Absolute: 0 10*3/uL (ref 0.0–0.1)
Basophils Relative: 0 %
Eosinophils Absolute: 0 10*3/uL (ref 0.0–0.5)
Eosinophils Relative: 0 %
HCT: 32.2 % — ABNORMAL LOW (ref 36.0–46.0)
Hemoglobin: 9.5 g/dL — ABNORMAL LOW (ref 12.0–15.0)
Immature Granulocytes: 1 %
Lymphocytes Relative: 18 %
Lymphs Abs: 1.8 10*3/uL (ref 0.7–4.0)
MCH: 26.3 pg (ref 26.0–34.0)
MCHC: 29.5 g/dL — ABNORMAL LOW (ref 30.0–36.0)
MCV: 89.2 fL (ref 80.0–100.0)
Monocytes Absolute: 0.5 10*3/uL (ref 0.1–1.0)
Monocytes Relative: 5 %
Neutro Abs: 7.6 10*3/uL (ref 1.7–7.7)
Neutrophils Relative %: 76 %
Platelets: 300 10*3/uL (ref 150–400)
RBC: 3.61 MIL/uL — ABNORMAL LOW (ref 3.87–5.11)
RDW: 17.3 % — ABNORMAL HIGH (ref 11.5–15.5)
WBC: 10 10*3/uL (ref 4.0–10.5)
nRBC: 0 % (ref 0.0–0.2)

## 2021-10-06 LAB — C DIFFICILE QUICK SCREEN W PCR REFLEX
C Diff antigen: POSITIVE — AB
C Diff toxin: NEGATIVE

## 2021-10-06 LAB — COMPREHENSIVE METABOLIC PANEL
ALT: 16 U/L (ref 0–44)
AST: 22 U/L (ref 15–41)
Albumin: 2.5 g/dL — ABNORMAL LOW (ref 3.5–5.0)
Alkaline Phosphatase: 56 U/L (ref 38–126)
Anion gap: 6 (ref 5–15)
BUN: 33 mg/dL — ABNORMAL HIGH (ref 8–23)
CO2: 27 mmol/L (ref 22–32)
Calcium: 8.5 mg/dL — ABNORMAL LOW (ref 8.9–10.3)
Chloride: 110 mmol/L (ref 98–111)
Creatinine, Ser: 1.43 mg/dL — ABNORMAL HIGH (ref 0.44–1.00)
GFR, Estimated: 39 mL/min — ABNORMAL LOW (ref >60)
Glucose, Bld: 167 mg/dL — ABNORMAL HIGH (ref 70–99)
Potassium: 5.5 mmol/L — ABNORMAL HIGH (ref 3.5–5.1)
Sodium: 143 mmol/L (ref 135–145)
Total Bilirubin: 1.1 mg/dL (ref 0.3–1.2)
Total Protein: 7 g/dL (ref 6.5–8.1)

## 2021-10-06 LAB — TROPONIN I (HIGH SENSITIVITY)
Troponin I (High Sensitivity): 19 ng/L — ABNORMAL HIGH (ref <18)
Troponin I (High Sensitivity): 18 ng/L — ABNORMAL HIGH (ref <18)

## 2021-10-06 LAB — GLUCOSE, CAPILLARY: Glucose-Capillary: 117 mg/dL — ABNORMAL HIGH (ref 70–99)

## 2021-10-06 LAB — SARS CORONAVIRUS 2 BY RT PCR: SARS Coronavirus 2 by RT PCR: NEGATIVE

## 2021-10-06 LAB — POTASSIUM: Potassium: 5.1 mmol/L (ref 3.5–5.1)

## 2021-10-06 LAB — AMMONIA: Ammonia: 17 umol/L (ref 9–35)

## 2021-10-06 LAB — CLOSTRIDIUM DIFFICILE BY PCR, REFLEXED: Toxigenic C. Difficile by PCR: POSITIVE — AB

## 2021-10-06 MED ORDER — GABAPENTIN 100 MG PO CAPS
100.0000 mg | ORAL_CAPSULE | Freq: Three times a day (TID) | ORAL | Status: DC
  Administered 2021-10-06 – 2021-10-10 (×11): 100 mg via ORAL
  Filled 2021-10-06 (×11): qty 1

## 2021-10-06 MED ORDER — ACETAMINOPHEN 325 MG PO TABS
650.0000 mg | ORAL_TABLET | Freq: Four times a day (QID) | ORAL | Status: DC | PRN
  Administered 2021-10-07 – 2021-10-09 (×4): 650 mg via ORAL
  Filled 2021-10-06 (×4): qty 2

## 2021-10-06 MED ORDER — SODIUM CHLORIDE 0.9 % IV SOLN: INTRAVENOUS | Status: DC

## 2021-10-06 MED ORDER — HYDRALAZINE HCL 25 MG PO TABS
50.0000 mg | ORAL_TABLET | Freq: Three times a day (TID) | ORAL | Status: DC
  Administered 2021-10-06 – 2021-10-08 (×5): 50 mg via ORAL
  Filled 2021-10-06 (×5): qty 2

## 2021-10-06 MED ORDER — HYDRALAZINE HCL 20 MG/ML IJ SOLN
10.0000 mg | Freq: Four times a day (QID) | INTRAMUSCULAR | Status: DC | PRN
  Administered 2021-10-07: 10 mg via INTRAVENOUS
  Filled 2021-10-06: qty 1

## 2021-10-06 MED ORDER — DEXTROSE-NACL 5-0.9 % IV SOLN: INTRAVENOUS | Status: DC

## 2021-10-06 MED ORDER — VERAPAMIL HCL 120 MG PO TABS
60.0000 mg | ORAL_TABLET | Freq: Three times a day (TID) | ORAL | Status: DC
Start: 2021-10-06 — End: 2021-10-08
  Administered 2021-10-06 – 2021-10-08 (×5): 60 mg via ORAL
  Filled 2021-10-06 (×4): qty 1

## 2021-10-06 MED ORDER — VITAMIN D 25 MCG (1000 UNIT) PO TABS
1000.0000 [IU] | ORAL_TABLET | Freq: Every day | ORAL | Status: DC
  Administered 2021-10-07 – 2021-10-10 (×4): 1000 [IU] via ORAL
  Filled 2021-10-06 (×4): qty 1

## 2021-10-06 MED ORDER — FIDAXOMICIN 200 MG PO TABS
200.0000 mg | ORAL_TABLET | Freq: Two times a day (BID) | ORAL | Status: DC
  Administered 2021-10-06 – 2021-10-10 (×8): 200 mg via ORAL
  Filled 2021-10-06 (×15): qty 1

## 2021-10-06 MED ORDER — INSULIN ASPART 100 UNIT/ML IJ SOLN
0.0000 [IU] | Freq: Three times a day (TID) | INTRAMUSCULAR | Status: DC
  Administered 2021-10-07: 2 [IU] via SUBCUTANEOUS
  Administered 2021-10-07: 3 [IU] via SUBCUTANEOUS
  Administered 2021-10-08 (×2): 2 [IU] via SUBCUTANEOUS
  Administered 2021-10-09: 3 [IU] via SUBCUTANEOUS
  Administered 2021-10-09 – 2021-10-10 (×4): 1 [IU] via SUBCUTANEOUS

## 2021-10-06 MED ORDER — SODIUM CHLORIDE 0.9 % IV SOLN
1.0000 g | Freq: Once | INTRAVENOUS | Status: AC
  Administered 2021-10-06: 1 g via INTRAVENOUS
  Filled 2021-10-06: qty 10

## 2021-10-06 MED ORDER — CHLORHEXIDINE GLUCONATE CLOTH 2 % EX PADS
6.0000 | MEDICATED_PAD | Freq: Every day | CUTANEOUS | Status: DC
  Administered 2021-10-06 – 2021-10-10 (×5): 6 via TOPICAL

## 2021-10-06 MED ORDER — SODIUM CHLORIDE 0.9 % IV BOLUS
1000.0000 mL | Freq: Once | INTRAVENOUS | Status: AC
  Administered 2021-10-06: 1000 mL via INTRAVENOUS

## 2021-10-06 MED ORDER — ALBUTEROL SULFATE (2.5 MG/3ML) 0.083% IN NEBU
2.5000 mg | INHALATION_SOLUTION | Freq: Four times a day (QID) | RESPIRATORY_TRACT | Status: DC
  Filled 2021-10-06: qty 3

## 2021-10-06 MED ORDER — INSULIN ASPART 100 UNIT/ML IJ SOLN
0.0000 [IU] | Freq: Every day | INTRAMUSCULAR | Status: DC
  Administered 2021-10-07 – 2021-10-09 (×3): 2 [IU] via SUBCUTANEOUS

## 2021-10-06 MED ORDER — PANTOPRAZOLE SODIUM 40 MG PO TBEC
40.0000 mg | DELAYED_RELEASE_TABLET | Freq: Every day | ORAL | Status: DC
Start: 2021-10-07 — End: 2021-10-07
  Administered 2021-10-07: 40 mg via ORAL
  Filled 2021-10-06: qty 1

## 2021-10-06 MED ORDER — ACETAMINOPHEN 650 MG RE SUPP: 650.0000 mg | Freq: Four times a day (QID) | RECTAL | Status: DC | PRN

## 2021-10-06 MED ORDER — ENOXAPARIN SODIUM 40 MG/0.4ML IJ SOSY
40.0000 mg | PREFILLED_SYRINGE | INTRAMUSCULAR | Status: DC
  Administered 2021-10-07 – 2021-10-08 (×2): 40 mg via SUBCUTANEOUS
  Filled 2021-10-06 (×2): qty 0.4

## 2021-10-06 NOTE — H&P (Signed)
[facility] History and Physical  Liticia Gasior Bialy LSL:[id number] DOB: 02-18-1951 DOA: 10/06/2021 PCP: Caprice Renshaw, MD  Admitted from: Skilled nursing facility Chief Complaint: Altered mentation  History of Present Illness: [name] is a 71 y.o. female with PMH significant for DM2, HTN, history of PE, Patient was sent to the ED complaint of altered mentation. Per report, at the nursing facility, patient has not been acting herself, speaking or moving extremities less than normal for more than 24 hours.  Per son, typically patient is chatty. Patient was last hospitalized 7/10 to 7/18 for similar symptoms.  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  Eliquis was stopped.  Her mental status gradually improved and she was discharged back to facility.  Per family, patient had similar symptoms in the past as well when she had UTI.  In the ED today, patient had a low-grade temperature of 90.5, heart rate over 110, blood pressure elevated to 171/81, breathing on room air. Labs with potassium elevated to 5.5, glucose 167, BUN/creatinine 33/1.43, hemoglobin 9.5 Urinalysis with hazy yellow urine with moderate amount of leukocytes, rare bacteria  While in the ED, patient was noted to have profuse diarrhea. C. difficile antigen positive, toxin negative. CT head showed resolution of previous right hippocampal hemorrhage. CT abdomen pelvis did not show any evidence of interested in obstruction or pneumoperitoneum. There is mild diffuse wall thickening in rectum along with moderate amount of stool in colon.  It showed possibility of stercoral colitis.    At the time of my evaluation, patient was brought up in bed.  Alert, awake, acknowledge my presence, only able to tell her name.  Tries to verbalize some words but only ends up saying 'Debruin'.  Able to follow motor commands however.  No family at bedside at the time of my evaluation  Review of Systems:  All systems were  reviewed and were negative unless otherwise mentioned in the HPI   Past medical history: Past Medical History:  Diagnosis Date   Cellulitis lower extremities   4 day hospitalization 12/25/14-12/29/14   Essential hypertension, benign    History of MRSA infection    History of panniculitis    Pulmonary emboli ([place]) 2009   Skin ulcer of thigh ([place])    Type 2 diabetes mellitus ([place])     Past surgical history: Past Surgical History:  Procedure Laterality Date   PARTIAL HYSTERECTOMY     Right thigh ulcer debridement  2008   Two occasions - wound VAC    Social History:  reports that she has never smoked. She has never used smokeless tobacco. She reports that she does not drink alcohol and does not use drugs.  Allergies:  Allergies  Allergen Reactions   Vancomycin Hives and Rash    "big red bumps, then they turned white, then I shedded like a snake."    Vancomycin   Family history:  Family History  Problem Relation Age of Onset   Cancer - Colon Father    Heart disease Mother    Diabetes Mellitus II Mother      Home Meds: Prior to Admission medications   Medication Sig Start Date End Date Taking? Authorizing Provider  acetaminophen (TYLENOL) 650 MG CR tablet Take 1,300 mg by mouth every 8 (eight) hours as needed for pain.    [provider]  ammonium lactate (LAC-HYDRIN) 12 % lotion Apply topically 2 (two) times daily. 04/13/15   Sinda Du, MD  atorvastatin (LIPITOR) 40  MG tablet Take 40 mg by mouth daily. 07/16/20   [provider]  cholecalciferol (VITAMIN D3) 25 MCG (1000 UNIT) tablet Take 1,000 Units by mouth daily.    [provider]  feeding supplement, GLUCERNA SHAKE, (GLUCERNA SHAKE) LIQD Take 237 mLs by mouth 3 (three) times daily between meals. Patient not taking: Reported on 08/29/2021 08/24/20   Heath Lark D, DO  ferrous sulfate 300 (60 Fe) MG/5ML syrup Take 5 mLs (300 mg total) by mouth daily with breakfast. 09/07/21   Amin, Jeanella Flattery, MD  fluticasone (FLONASE) 50 MCG/ACT nasal spray Place 1 spray into both nostrils daily.    [provider]  gabapentin (NEURONTIN) 100 MG capsule Take 100 mg by mouth 3 (three) times daily. 07/19/18   [provider]  hydrALAZINE (APRESOLINE) 50 MG tablet Take 1 tablet (50 mg total) by mouth every 8 (eight) hours. 09/06/21   Amin, Jeanella Flattery, MD  lisinopril (ZESTRIL) 30 MG tablet Take 15 mg by mouth daily. 06/26/20   [provider]  metFORMIN (GLUCOPHAGE) 1000 MG tablet Take 500 mg by mouth 2 (two) times a day. 06/30/18   [provider]  mirtazapine (REMERON) 7.5 MG tablet Take 1 tablet (7.5 mg total) by mouth at bedtime. 08/24/20 09/23/20  Manuella Ghazi, Pratik D, DO  pantoprazole (PROTONIX) 40 MG tablet Take 40 mg by mouth daily. 09/06/21   [provider]  pantoprazole sodium (PROTONIX) 40 mg Take 40 mg by mouth at bedtime. 09/06/21   Amin, Jeanella Flattery, MD  senna (SENOKOT) 8.6 MG TABS tablet Take 1 tablet (8.6 mg total) by mouth daily as needed for mild constipation. Patient not taking: Reported on 08/29/2021 04/13/15   Sinda Du, MD  verapamil (CALAN) 120 MG tablet Take 0.5 tablets (60 mg total) by mouth every 8 (eight) hours. 09/06/21   Amin, Jeanella Flattery, MD  vitamin C (ASCORBIC ACID) 500 MG tablet Take 500 mg by mouth daily.    [provider]    Physical Exam: Vitals:   10/06/21 1700 10/06/21 1830 10/06/21 1900 10/06/21 1930  BP: (!) 124/54 126/62  115/78  Pulse: (!) 104 (!) 103 (!) 101 99  Resp: '16 17 19 19  '$ Temp:      TempSrc:      SpO2: 100% 99% 94% 99%   Wt Readings from Last 3 Encounters:  09/08/21 100.7 kg  08/29/21 101 kg  08/24/20 110.9 kg   There is no height or weight on file to calculate BMI.  General exam: Elderly African-American female.  Propped up in bed.  Not in physical distress Skin: No rashes, lesions or ulcers. HEENT: Atraumatic, normocephalic, no obvious bleeding Lungs: Clear to auscultation  bilaterally CVS: Regular rate and rhythm, no murmur GI/Abd soft, nontender, nondistended, bowel sound present CNS: Alert, awake, able to tell her name only.  Not able to verbalize anything else sensible.  Able to follow motor commands Psychiatry: Sad affect Extremities: 1+ bilateral pedal edema, no calf tenderness     Consult Orders  (From admission, onward)           Start     Ordered   10/06/21 1928  Consult to hospitalist  Once       Provider:  (Not yet assigned)  Question Answer Comment  Place call to: [facility]   Reason for Consult Admit      10/06/21 1927            Labs on Admission:   CBC: Recent Labs  Lab 10/06/21 1212  WBC 10.0  NEUTROABS 7.6  HGB 9.5*  HCT 32.2*  MCV 89.2  PLT 627    Basic Metabolic Panel: Recent Labs  Lab 10/06/21 1212 10/06/21 1425  NA 143  --   K 5.5* 5.1  CL 110  --   CO2 27  --   GLUCOSE 167*  --   BUN 33*  --   CREATININE 1.43*  --   CALCIUM 8.5*  --     Liver Function Tests: Recent Labs  Lab 10/06/21 1212  AST 22  ALT 16  ALKPHOS 56  BILITOT 1.1  PROT 7.0  ALBUMIN 2.5*   No results for input(s): "LIPASE", "AMYLASE" in the last 168 hours. Recent Labs  Lab 10/06/21 1358  AMMONIA 17    Cardiac Enzymes: No results for input(s): "CKTOTAL", "CKMB", "CKMBINDEX", "TROPONINI" in the last 168 hours.  BNP (last 3 results) No results for input(s): "BNP" in the last 8760 hours.  ProBNP (last 3 results) No results for input(s): "PROBNP" in the last 8760 hours.  CBG: No results for input(s): "GLUCAP" in the last 168 hours.  Lipase     Component Value Date/Time   LIPASE 35 09/08/2021 1324     Urinalysis    Component Value Date/Time   COLORURINE YELLOW 10/06/2021 1755   APPEARANCEUR HAZY (A) 10/06/2021 1755   LABSPEC 1.011 10/06/2021 1755   PHURINE 6.0 10/06/2021 1755   GLUCOSEU NEGATIVE 10/06/2021 1755   HGBUR NEGATIVE 10/06/2021 1755   BILIRUBINUR NEGATIVE 10/06/2021 1755   KETONESUR  NEGATIVE 10/06/2021 1755   PROTEINUR 100 (A) 10/06/2021 1755   UROBILINOGEN 0.2 12/25/2014 1248   NITRITE NEGATIVE 10/06/2021 1755   LEUKOCYTESUR MODERATE (A) 10/06/2021 1755     Drugs of Abuse     Component Value Date/Time   LABOPIA NONE DETECTED 07/20/2020 1351   COCAINSCRNUR NONE DETECTED 07/20/2020 1351   LABBENZ NONE DETECTED 07/20/2020 1351   AMPHETMU NONE DETECTED 07/20/2020 1351   THCU NONE DETECTED 07/20/2020 1351   LABBARB NONE DETECTED 07/20/2020 1351      Radiological Exams on Admission: CT Head Wo Contrast  Result Date: 10/06/2021 CLINICAL DATA:  Altered mental status, recent right hippocampal hemorrhage EXAM: CT HEAD WITHOUT CONTRAST TECHNIQUE: Contiguous axial images were obtained from the base of the skull through the vertex without intravenous contrast. RADIATION DOSE REDUCTION: This exam was performed according to the departmental dose-optimization program which includes automated exposure control, adjustment of the mA and/or kV according to patient size and/or use of iterative reconstruction technique. COMPARISON:  09/08/2021 FINDINGS: Brain: No evidence of acute infarction, acute hemorrhage, hydrocephalus, extra-axial collection or mass lesion/mass effect. A previously seen right hippocampal hemorrhage is resolved, with residual hypodensity in this vicinity (series 2, image 13). No new hemorrhage. Periventricular and deep white matter hypodensity. Lacunar infarction of the left corona radiata (series 2, image 19). Vascular: No hyperdense vessel or unexpected calcification. Skull: Normal. Negative for fracture or focal lesion. Sinuses/Orbits: No acute finding. Other: None. IMPRESSION: 1. A previously seen right hippocampal hemorrhage is resolved, with residual hypodensity in this vicinity most likely reflecting developing encephalomalacia. No new hemorrhage. 2. 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 Electronically  Signed   By: Delanna Ahmadi M.D.   On: 10/06/2021 12:40     ------------------------------------------------------------------------------------------------------ Assessment/Plan: Active Problems:   * No active hospital problems. *  Acute metabolic encephalopathy Primarily presented with altered mental status, decreased alertness for more than 24 hours.  Patient had similar presentation last month and was noted to have intracranial hemorrhage.  Per family, patient had similar symptoms with UTI as well.   Urinalysis consistent with UTI at this time. CT head this time did not show any new finding and showed resolution of the previously noted ICH. Her most remarkable neurological symptoms at this time is expressive aphasia.  She had MRI brain and MRA head and neck done 5 weeks ago only.  I do not see any yield of repeating the same at this time.  Continue monitor clinically. PTA on Neurontin 100 mg 3 times daily, Remeron 7.5 mg at bedtime Continue the same  C. difficile infection Primarily presented with altered mentation.  No fever, WBC count normal.   While in the ED, patient was noted to have profuse diarrhea. Stool assay with C. difficile antigen positive, toxin negative. Based on the severity of her symptoms, I would empirically start her on treatment for C. difficile.  Unable to start vancomycin because of allergy (hives).  Started on Dificid.  C. difficile PCR in process. Patient needed fecal bag placement in the ED.  Continue monitor diarrhea frequency, severity IV hydration with normal saline Recent Labs  Lab 10/06/21 1212  WBC 10.0   Type 2 diabetes mellitus A1c 5.6 PTA on metformin 500 mg twice daily, Keep it on hold.  Start on sliding scale insulin with Accu-Cheks.  Chronic diastolic CHF  hypertension Echo 08/30/2021 showed EF more than 75%, G1DD, hyperdynamic right ventricle.  Clinically patient has significant bilateral lower extremity edema.  Patient is unable to clarify  the chronicity of it.  Not on diuretics. PTA on verapamil 60 mg 3 times daily, hydralazine 50 mg 3 times daily, lisinopril 50 mg daily. Resume all.  Continue monitor  HLD Continue Lipitor  History of PE Not on anticoagulation since ICH last admission  CKD 3b Creatinine remains at baseline Recent Labs    08/31/21 1610 09/01/21 0252 09/01/21 1534 09/02/21 0503 09/03/21 0324 09/04/21 0149 09/05/21 0440 09/06/21 0455 09/08/21 1324 10/06/21 1212  BUN '19 22 19 18 17 16 13 12 15 '$ 33*  CREATININE 1.53* 1.62* 1.53* 1.40* 1.42* 1.40* 1.29* 1.31* 1.43* 1.43*   Chronic anemia Continue iron supplement.  Continue to trend hemoglobin. Recent Labs    09/02/21 0503 09/03/21 0324 09/04/21 0149 09/08/21 1324 10/06/21 1212  HGB 11.2* 11.0* 10.1* 11.2* 9.5*  MCV 85.0 81.9 84.1 86.0 89.2   Adrenal adenoma There is 2.6 cm right adrenal nodule which measured 1.8 cm in the study done on 09/24/2001, most likely adenoma.   Mobility -PT eval once diarrhea is controlled  Goals of care - -  Code Status: Prior unable to clarify CODE STATUS.  In the most recent hospitalization, patient was full code.  Prior to that she was in a DNR status.  Ordered full code for now.  Diet: Regular diet Diet Order     None      DVT prophylaxis: Lovenox subcu   Antimicrobials: Dificid Fluid: Start NS at 100 mill per hour because of ongoing diarrhea Consultants: None Family Communication: None at bedside Dispo: The patient is from: Nursing facility              Anticipated d/c is to: Back to nursing facility        ------------------------------------------------------------------------------------- Severity of Illness: The appropriate patient status for this patient is OBSERVATION. Observation status is judged to be reasonable and necessary in order to provide the required intensity of service to ensure  the patient's safety. The patient's presenting symptoms, physical exam findings, and initial  radiographic and laboratory data in the context of their medical condition is felt to place them at decreased risk for further clinical deterioration. Furthermore, it is anticipated that the patient will be medically stable for discharge from the hospital within 2 midnights of admission.   Signed, Terrilee Croak, MD [facility] 10/06/2021

## 2021-10-06 NOTE — ED Notes (Signed)
Attempted to do pts in and out per order, but wasn't succcessful due to pts bowel movements. Pt had two bowel movements back to back and said she wasn't finished. Told pt to let us know when finished and this tech and nurse would clean her up.

## 2021-10-06 NOTE — ED Provider Notes (Signed)
  Physical Exam  BP 115/78   Pulse 99   Temp 97.8 F (36.6 C) (Oral)   Resp 19   SpO2 99%   Physical Exam Vitals and nursing note reviewed.  Constitutional:      General: She is not in acute distress.    Appearance: She is well-developed.  HENT:     Head: Normocephalic and atraumatic.  Eyes:     Conjunctiva/sclera: Conjunctivae normal.  Cardiovascular:     Rate and Rhythm: Normal rate and regular rhythm.     Heart sounds: No murmur heard. Pulmonary:     Effort: Pulmonary effort is normal. No respiratory distress.     Breath sounds: Normal breath sounds.  Abdominal:     Palpations: Abdomen is soft.     Tenderness: There is no abdominal tenderness.  Musculoskeletal:        General: No swelling.     Cervical back: Neck supple.  Skin:    General: Skin is warm and dry.     Capillary Refill: Capillary refill takes less than 2 seconds.  Neurological:     Mental Status: She is alert.  Psychiatric:        Mood and Affect: Mood normal.     Procedures  Procedures  ED Course / MDM   Clinical Course as of 10/06/21 2332  Thu Oct 06, 2021  1506 Nurse to perform in and out cath .  Patient remains essentially unchanged, by which I mean she is nonverbal, but she is nodding her head appropriately to every question.  We will attempt a p.o. challenge.  I suspect this may be encephalomalacia or recrudescence from her prior strokes of brain injuries, she did have similar episodes while she was in the hospital where she had a "expressive aphasia" or was not speaking essentially, felt to be again recrudescence. [MT]  [place] reporting patient continues to have copious amounts of watery foul-smelling diarrhea.  We will add on a C. difficile culture [MT]  1536 Pending UA and C diff [MK]    Clinical Course User Index [MK] Maximino Cozzolino, MD [MT] Langston Masker, Carola Rhine, MD   Medical Decision Making Amount and/or Complexity of Data Reviewed Labs: ordered. Radiology:  ordered.  Risk Decision regarding hospitalization.   Patient received in handoff.  Patient stopped talking in her nursing home and there was concern for possible stroke versus recrudescence.  Per previous provider, patient's primary issues today are more related to her persistent diarrhea and possible UTI.  At time of signout, C. difficile and UA are pending.  C. difficile PCR is positive and patient started on oral vancomycin.  Urinalysis is also positive with moderate leuk esterase and greater than 50 white blood cells and started on ceftriaxone.  Patient then admitted.       Teressa Lower, MD 10/06/21 [phone]

## 2021-10-06 NOTE — ED Notes (Signed)
Attempted to perform in and catheter on patient.  Patient continues to have diarrhea during the attempt to get urine during in and out procedure.  Made provider aware that the patient continues to have diarrhea multiple times since arrival and is not able to control.  Noted that there is a strong smell.

## 2021-10-06 NOTE — ED Provider Notes (Signed)
[facility] EMERGENCY DEPARTMENT Provider Note   CSN: [id number] Arrival date & time: 10/06/21  1122     History  Chief Complaint  Patient presents with   Weakness    Jovonda L Oldenburg is a 71 y.o. female presenting from a nursing facility with concern for altered mental status.  Facility and patient's son reports that the patient has not been behaving normally since yesterday morning.  Patient's son says typically the patient is quite chatty, but she has not been talking to him.  Patient is still able to move extremities.  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  She had several episodes of "expressive aphasia while in the hospital without evidence of stroke on MRI.  EEG the year before head showed severe diffuse encephalomalacia of nonspecific etiology.  The patient is chronically wheelchair-bound.  HPI     Home Medications Prior to Admission medications   Medication Sig Start Date End Date Taking? Authorizing Provider  acetaminophen (TYLENOL) 650 MG CR tablet Take 1,300 mg by mouth every 8 (eight) hours as needed for pain.    [provider]  ammonium lactate (LAC-HYDRIN) 12 % lotion Apply topically 2 (two) times daily. 04/13/15   Sinda Du, MD  atorvastatin (LIPITOR) 40 MG tablet Take 40 mg by mouth daily. 07/16/20   [provider]  cholecalciferol (VITAMIN D3) 25 MCG (1000 UNIT) tablet Take 1,000 Units by mouth daily.    [provider]  feeding supplement, GLUCERNA SHAKE, (GLUCERNA SHAKE) LIQD Take 237 mLs by mouth 3 (three) times daily between meals. Patient not taking: Reported on 08/29/2021 08/24/20   Heath Lark D, DO  ferrous sulfate 300 (60 Fe) MG/5ML syrup Take 5 mLs (300 mg total) by mouth daily with breakfast. 09/07/21    Amin, Jeanella Flattery, MD  fluticasone (FLONASE) 50 MCG/ACT nasal spray Place 1 spray into both nostrils daily.    [provider]  gabapentin (NEURONTIN) 100 MG capsule Take 100 mg by mouth 3 (three) times daily. 07/19/18   [provider]  hydrALAZINE (APRESOLINE) 50 MG tablet Take 1 tablet (50 mg total) by mouth every 8 (eight) hours. 09/06/21   Amin, Jeanella Flattery, MD  lisinopril (ZESTRIL) 30 MG tablet Take 15 mg by mouth daily. 06/26/20   [provider]  metFORMIN (GLUCOPHAGE) 1000 MG tablet Take 500 mg by mouth 2 (two) times a day. 06/30/18   [provider]  mirtazapine (REMERON) 7.5 MG tablet Take 1 tablet (7.5 mg total) by mouth at bedtime. 08/24/20 09/23/20  Manuella Ghazi, Pratik D, DO  pantoprazole (PROTONIX) 40 MG tablet Take 40 mg by mouth daily. 09/06/21   [provider]  pantoprazole sodium (PROTONIX) 40 mg Take 40 mg by mouth at bedtime. 09/06/21   Amin, Jeanella Flattery, MD  senna (SENOKOT) 8.6 MG TABS tablet Take 1 tablet (8.6 mg total) by mouth daily as needed for mild constipation. Patient not taking: Reported on 08/29/2021 04/13/15   Sinda Du, MD  verapamil (CALAN) 120 MG tablet Take 0.5 tablets (60 mg total) by mouth every 8 (eight) hours. 09/06/21   Amin, Jeanella Flattery, MD  vitamin C (ASCORBIC ACID) 500 MG tablet Take 500 mg by mouth daily.    [provider]      Allergies  Vancomycin    Review of Systems   Review of Systems  Physical Exam Updated Vital Signs BP (!) 112/40   Pulse (!) 107   Temp 99.1 F (37.3 C) (Oral)   Resp 18   SpO2 100%  Physical Exam Constitutional:      General: She is not in acute distress.    Appearance: She is obese.  HENT:     Head: Normocephalic and atraumatic.  Eyes:     Conjunctiva/sclera: Conjunctivae normal.     Pupils: Pupils are equal, round, and reactive to light.  Cardiovascular:     Rate and Rhythm: Normal rate and regular rhythm.  Pulmonary:     Effort: Pulmonary effort is normal.  No respiratory distress.  Abdominal:     General: There is no distension.     Tenderness: There is no abdominal tenderness.  Skin:    General: Skin is warm and dry.  Neurological:     Mental Status: She is alert.     Comments: Patient is following commands and nodding appropriately to questions but will not speak to me.  She is able to move all of her arms and legs. No facial droop  Psychiatric:        Mood and Affect: Mood normal.        Behavior: Behavior normal.     ED Results / Procedures / Treatments   Labs (all labs ordered are listed, but only abnormal results are displayed) Labs Reviewed  COMPREHENSIVE METABOLIC PANEL - Abnormal; Notable for the following components:      Result Value   Potassium 5.5 (*)    Glucose, Bld 167 (*)    BUN 33 (*)    Creatinine, Ser 1.43 (*)    Calcium 8.5 (*)    Albumin 2.5 (*)    GFR, Estimated 39 (*)    All other components within normal limits  CBC WITH DIFFERENTIAL/PLATELET - Abnormal; Notable for the following components:   RBC 3.61 (*)    Hemoglobin 9.5 (*)    HCT 32.2 (*)    MCHC 29.5 (*)    RDW 17.3 (*)    All other components within normal limits  TROPONIN I (HIGH SENSITIVITY) - Abnormal; Notable for the following components:   Troponin I (High Sensitivity) 19 (*)    All other components within normal limits  TROPONIN I (HIGH SENSITIVITY) - Abnormal; Notable for the following components:   Troponin I (High Sensitivity) 18 (*)    All other components within normal limits  SARS CORONAVIRUS 2 BY RT PCR  C DIFFICILE QUICK SCREEN W PCR REFLEX    AMMONIA  POTASSIUM  URINALYSIS, ROUTINE W REFLEX MICROSCOPIC    EKG EKG Interpretation  Date/Time:  Thursday October 06 2021 11:31:16 EDT Ventricular Rate:  116 PR Interval:  138 QRS Duration: 88 QT Interval:  319 QTC Calculation: 444 R Axis:   -29 Text Interpretation: Sinus tachycardia LVH with secondary repolarization abnormality Inferior infarct, old Confirmed by Kommor,  Madison ([id number]) on 10/06/2021 3:37:46 PM  Radiology CT Head Wo Contrast  Result Date: 10/06/2021 CLINICAL DATA:  Altered mental status, recent right hippocampal hemorrhage EXAM: CT HEAD WITHOUT CONTRAST TECHNIQUE: Contiguous axial images were obtained from the base of the skull through the vertex without intravenous contrast. RADIATION DOSE REDUCTION: This exam was performed according to the departmental dose-optimization program which includes automated exposure control, adjustment of the mA and/or kV according to patient size and/or use of iterative reconstruction technique. COMPARISON:  09/08/2021 FINDINGS:  Brain: No evidence of acute infarction, acute hemorrhage, hydrocephalus, extra-axial collection or mass lesion/mass effect. A previously seen right hippocampal hemorrhage is resolved, with residual hypodensity in this vicinity (series 2, image 13). No new hemorrhage. Periventricular and deep white matter hypodensity. Lacunar infarction of the left corona radiata (series 2, image 19). Vascular: No hyperdense vessel or unexpected calcification. Skull: Normal. Negative for fracture or focal lesion. Sinuses/Orbits: No acute finding. Other: None. IMPRESSION: 1. A previously seen right hippocampal hemorrhage is resolved, with residual hypodensity in this vicinity most likely reflecting developing encephalomalacia. No new hemorrhage. 2. 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 Electronically Signed   By: Delanna Ahmadi M.D.   On: 10/06/2021 12:40    Procedures Procedures    Medications Ordered in ED Medications  sodium chloride 0.9 % bolus 1,000 mL (1,000 mLs Intravenous New Bag/Given 10/06/21 1342)  cefTRIAXone (ROCEPHIN) 1 g in sodium chloride 0.9 % 100 mL IVPB (0 g Intravenous Stopped 10/06/21 1652)    ED Course/ Medical Decision Making/ A&P Clinical Course as of 10/06/21 1658  Thu Oct 06, 2021  1506 Nurse to perform in and out cath .   Patient remains essentially unchanged, by which I mean she is nonverbal, but she is nodding her head appropriately to every question.  We will attempt a p.o. challenge.  I suspect this may be encephalomalacia or recrudescence from her prior strokes of brain injuries, she did have similar episodes while she was in the hospital where she had a "expressive aphasia" or was not speaking essentially, felt to be again recrudescence. [MT]  [place] reporting patient continues to have copious amounts of watery foul-smelling diarrhea.  We will add on a C. difficile culture [MT]  1536 Pending UA and C diff [MK]    Clinical Course User Index [MK] Kommor, Madison, MD [MT] Langston Masker, Carola Rhine, MD                           Medical Decision Making Amount and/or Complexity of Data Reviewed Labs: ordered. Radiology: ordered.   This patient presents to the Emergency Department with complaint of altered mental status.  This involves an extensive number of treatment options, and is a complaint that carries with it a high risk of complications and morbidity.  The differential diagnosis includes hypoglycemia vs metabolic encephalopathy vs infection (including cystitis) vs ICH vs stroke vs polypharmacy vs other  This could also be recrudescence of her prior stroke or flareup of the encephalomalacia, which was felt to be cause of her "expressive aphasia" upon her last hospitalization.  I ordered, reviewed, and interpreted labs, including K 5.5 -> 5.1 after fluids.  Cr and trop at baseline.  WBC 10.0  UA and C diff pending I ordered medication rocephin for malodorous urine (difficulty obtaining UA initially), for empiric UTI coverage.  Antibiotics can be amended or discontinued pending UA and C diff results I ordered imaging studies which included CT scan of the head I independently visualized and interpreted imaging which showed no acute CVA, xray with pulm edema (no clinical SOB or hypoxia) Additional history was  obtained from the patient's son at bedside and EMS I personally reviewed her external records including her hospitalization course and discharge summary from July of this year. I personally reviewed the patients ECG which showed sinus rhythm with no acute ischemic findings  After the interventions stated above, I reevaluated the patient and found  she remains stable.  Signed out to Dr Matilde Sprang EDP pending f/u on UA and C diff results, possible admission for mental status change w/ superimposed infection         Final Clinical Impression(s) / ED Diagnoses Final diagnoses:  None    Rx / DC Orders ED Discharge Orders     None         Wyvonnia Dusky, MD 10/06/21 1700

## 2021-10-06 NOTE — ED Triage Notes (Addendum)
BIB by EMS after facility reports that patient has not been acting herself, speaking, or moving extremities since sometime yesterday morning. LWKT is not known nor did staff report specific deficits. Patient able to move left arm. Not answering questions. States that son saw patient yesterday at lunch and this is how she presented.

## 2021-10-07 ENCOUNTER — Encounter (HOSPITAL_COMMUNITY): Payer: Self-pay | Admitting: Internal Medicine

## 2021-10-07 ENCOUNTER — Other Ambulatory Visit (HOSPITAL_COMMUNITY): Payer: Self-pay

## 2021-10-07 DIAGNOSIS — I82403 Acute embolism and thrombosis of unspecified deep veins of lower extremity, bilateral: Secondary | ICD-10-CM | POA: Diagnosis present

## 2021-10-07 DIAGNOSIS — R4701 Aphasia: Secondary | ICD-10-CM | POA: Diagnosis present

## 2021-10-07 DIAGNOSIS — N1832 Chronic kidney disease, stage 3b: Secondary | ICD-10-CM | POA: Diagnosis present

## 2021-10-07 DIAGNOSIS — R4182 Altered mental status, unspecified: Secondary | ICD-10-CM | POA: Diagnosis present

## 2021-10-07 DIAGNOSIS — Z66 Do not resuscitate: Secondary | ICD-10-CM | POA: Diagnosis present

## 2021-10-07 DIAGNOSIS — Z20822 Contact with and (suspected) exposure to covid-19: Secondary | ICD-10-CM | POA: Diagnosis present

## 2021-10-07 DIAGNOSIS — D649 Anemia, unspecified: Secondary | ICD-10-CM | POA: Diagnosis present

## 2021-10-07 DIAGNOSIS — I6340 Cerebral infarction due to embolism of unspecified cerebral artery: Secondary | ICD-10-CM | POA: Diagnosis not present

## 2021-10-07 DIAGNOSIS — I5032 Chronic diastolic (congestive) heart failure: Secondary | ICD-10-CM | POA: Diagnosis present

## 2021-10-07 DIAGNOSIS — G9341 Metabolic encephalopathy: Secondary | ICD-10-CM | POA: Diagnosis present

## 2021-10-07 DIAGNOSIS — Z7984 Long term (current) use of oral hypoglycemic drugs: Secondary | ICD-10-CM | POA: Diagnosis not present

## 2021-10-07 DIAGNOSIS — E875 Hyperkalemia: Secondary | ICD-10-CM | POA: Diagnosis present

## 2021-10-07 DIAGNOSIS — I130 Hypertensive heart and chronic kidney disease with heart failure and stage 1 through stage 4 chronic kidney disease, or unspecified chronic kidney disease: Secondary | ICD-10-CM | POA: Diagnosis present

## 2021-10-07 DIAGNOSIS — E785 Hyperlipidemia, unspecified: Secondary | ICD-10-CM | POA: Diagnosis present

## 2021-10-07 DIAGNOSIS — Z8673 Personal history of transient ischemic attack (TIA), and cerebral infarction without residual deficits: Secondary | ICD-10-CM | POA: Diagnosis not present

## 2021-10-07 DIAGNOSIS — Z86711 Personal history of pulmonary embolism: Secondary | ICD-10-CM | POA: Diagnosis not present

## 2021-10-07 DIAGNOSIS — A0472 Enterocolitis due to Clostridium difficile, not specified as recurrent: Secondary | ICD-10-CM | POA: Diagnosis present

## 2021-10-07 DIAGNOSIS — I639 Cerebral infarction, unspecified: Secondary | ICD-10-CM | POA: Diagnosis not present

## 2021-10-07 DIAGNOSIS — Z7901 Long term (current) use of anticoagulants: Secondary | ICD-10-CM | POA: Diagnosis not present

## 2021-10-07 DIAGNOSIS — Z79899 Other long term (current) drug therapy: Secondary | ICD-10-CM | POA: Diagnosis not present

## 2021-10-07 DIAGNOSIS — E860 Dehydration: Secondary | ICD-10-CM | POA: Diagnosis present

## 2021-10-07 DIAGNOSIS — L89892 Pressure ulcer of other site, stage 2: Secondary | ICD-10-CM | POA: Diagnosis present

## 2021-10-07 DIAGNOSIS — N390 Urinary tract infection, site not specified: Secondary | ICD-10-CM | POA: Diagnosis present

## 2021-10-07 DIAGNOSIS — Z6835 Body mass index (BMI) 35.0-35.9, adult: Secondary | ICD-10-CM | POA: Diagnosis not present

## 2021-10-07 DIAGNOSIS — E1122 Type 2 diabetes mellitus with diabetic chronic kidney disease: Secondary | ICD-10-CM | POA: Diagnosis present

## 2021-10-07 LAB — CBC
HCT: 31.7 % — ABNORMAL LOW (ref 36.0–46.0)
Hemoglobin: 9.6 g/dL — ABNORMAL LOW (ref 12.0–15.0)
MCH: 26.4 pg (ref 26.0–34.0)
MCHC: 30.3 g/dL (ref 30.0–36.0)
MCV: 87.3 fL (ref 80.0–100.0)
Platelets: 259 10*3/uL (ref 150–400)
RBC: 3.63 MIL/uL — ABNORMAL LOW (ref 3.87–5.11)
RDW: 17.3 % — ABNORMAL HIGH (ref 11.5–15.5)
WBC: 9.4 10*3/uL (ref 4.0–10.5)
nRBC: 0 % (ref 0.0–0.2)

## 2021-10-07 LAB — GLUCOSE, CAPILLARY
Glucose-Capillary: 120 mg/dL — ABNORMAL HIGH (ref 70–99)
Glucose-Capillary: 201 mg/dL — ABNORMAL HIGH (ref 70–99)
Glucose-Capillary: 189 mg/dL — ABNORMAL HIGH (ref 70–99)
Glucose-Capillary: 231 mg/dL — ABNORMAL HIGH (ref 70–99)

## 2021-10-07 LAB — BASIC METABOLIC PANEL
Anion gap: 9 (ref 5–15)
BUN: 26 mg/dL — ABNORMAL HIGH (ref 8–23)
CO2: 24 mmol/L (ref 22–32)
Calcium: 8.4 mg/dL — ABNORMAL LOW (ref 8.9–10.3)
Chloride: 111 mmol/L (ref 98–111)
Creatinine, Ser: 1.1 mg/dL — ABNORMAL HIGH (ref 0.44–1.00)
GFR, Estimated: 54 mL/min — ABNORMAL LOW (ref >60)
Glucose, Bld: 137 mg/dL — ABNORMAL HIGH (ref 70–99)
Potassium: 4.2 mmol/L (ref 3.5–5.1)
Sodium: 144 mmol/L (ref 135–145)

## 2021-10-07 LAB — MRSA NEXT GEN BY PCR, NASAL: MRSA by PCR Next Gen: NOT DETECTED

## 2021-10-07 MED ORDER — ASPIRIN 81 MG PO TBEC
81.0000 mg | DELAYED_RELEASE_TABLET | Freq: Every day | ORAL | Status: DC
  Administered 2021-10-07 – 2021-10-08 (×2): 81 mg via ORAL
  Filled 2021-10-07 (×2): qty 1

## 2021-10-07 MED ORDER — ALBUTEROL SULFATE (2.5 MG/3ML) 0.083% IN NEBU: 2.5000 mg | INHALATION_SOLUTION | Freq: Four times a day (QID) | RESPIRATORY_TRACT | Status: DC | PRN

## 2021-10-07 MED ORDER — METOPROLOL TARTRATE 25 MG PO TABS: 25.0000 mg | ORAL_TABLET | Freq: Two times a day (BID) | ORAL | 0 refills | Status: DC

## 2021-10-07 MED ORDER — METOPROLOL TARTRATE 25 MG PO TABS
25.0000 mg | ORAL_TABLET | Freq: Two times a day (BID) | ORAL | Status: DC
  Administered 2021-10-07 – 2021-10-08 (×3): 25 mg via ORAL
  Filled 2021-10-07 (×3): qty 1

## 2021-10-07 MED ORDER — FLORANEX PO PACK
1.0000 g | PACK | Freq: Three times a day (TID) | ORAL | Status: DC
  Administered 2021-10-07 – 2021-10-10 (×7): 1 g via ORAL
  Filled 2021-10-07 (×19): qty 1

## 2021-10-07 MED ORDER — FLORANEX PO PACK
1.0000 g | PACK | Freq: Three times a day (TID) | ORAL | 0 refills | Status: AC
Start: 2021-10-07 — End: 2021-10-21

## 2021-10-07 MED ORDER — FIDAXOMICIN 200 MG PO TABS: 200.0000 mg | ORAL_TABLET | Freq: Two times a day (BID) | ORAL | 0 refills | Status: DC

## 2021-10-07 MED ORDER — ASPIRIN 81 MG PO TBEC: 81.0000 mg | DELAYED_RELEASE_TABLET | Freq: Every day | ORAL | 0 refills | Status: DC

## 2021-10-07 MED ORDER — ORAL CARE MOUTH RINSE: 15.0000 mL | OROMUCOSAL | Status: DC | PRN

## 2021-10-07 NOTE — TOC Initial Note (Addendum)
Transition of Care [facility]) - Initial/Assessment Note    Patient Details  Name: [name] MRN: [id number] Date of Birth: [birth year]  [facility] CM/SW Contact:    Iona Beard, [place] Phone Number: 10/07/2021, 11:53 AM  Clinical Narrative:                 Pt is long term resident at [facility]. CSW spoke to [place] in admissions who states pt can return at D/C. Debbie states D/C will need to be completed by 10:30 is pt D/C over weekend on new medications so they are able to get from pharmacy. CSW updated MD of this.   PT recommended SNF at D/C. CSW spoke with Jackelyn Poling who states pt has in house insurance plan and the [employer] NP will assess pt when she arrives and can get pt set up with therapies if needed. TOC to follow.   Expected Discharge Plan: Long Term Nursing Home Barriers to Discharge: Continued Medical Work up   Patient Goals and CMS Choice Patient states their goals for this hospitalization and ongoing recovery are:: return to LTC facility CMS Medicare.gov Compare Post Acute Care list provided to:: Patient Choice offered to / list presented to : Patient  Expected Discharge Plan and Services Expected Discharge Plan: Long Term Nursing Home In-house Referral: Clinical Social Work Discharge Planning Services: CM Consult Post Acute Care Choice: Nursing Home Living arrangements for the past 2 months: [place]                                      Prior Living Arrangements/Services Living arrangements for the past 2 months: [place] Lives with:: Facility Resident Patient language and need for interpreter reviewed:: Yes Do you feel safe going back to the place where you live?: Yes      Need for Family Participation in Patient Care: Yes (Comment) Care giver support system in place?: Yes (comment)   Criminal Activity/Legal Involvement Pertinent to Current Situation/Hospitalization: No - Comment as needed  Activities  of Daily Living   ADL Screening (condition at time of admission) Patient's cognitive ability adequate to safely complete daily activities?: No  Permission Sought/Granted                  Emotional Assessment Appearance:: Appears stated age       Alcohol / Substance Use: Not Applicable Psych Involvement: No (comment)  Admission diagnosis:  Acute metabolic encephalopathy [P10.25] Altered mental status [R41.82] Patient Active Problem List   Diagnosis Date Noted   Altered mental status 10/07/2021   Hemorrhagic stroke ([place]) 08/29/2021   ICH (intracerebral hemorrhage) ([place]) 08/29/2021   Pressure injury of skin 08/26/2020   Encephalopathy acute 08/24/2020   SIRS (systemic inflammatory response syndrome) ([place]) 08/21/2020   UTI (urinary tract infection) 08/21/2020   Hyperkalemia 08/21/2020   Hypoalbuminemia due to protein-calorie malnutrition ([place]) 85/27/7824   Acute metabolic encephalopathy 23/53/6144   CVA (cerebral vascular accident) ([place]) 07/24/2018   Depression 04/12/2015   TIA (transient ischemic attack) 04/09/2015   Aphasia 04/09/2015   Cellulitis and abscess of leg 04/09/2015   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   Fever, unspecified 12/25/2014   Sepsis ([place]) 12/25/2014   Cellulitis 12/25/2014   Essential hypertension, benign 12/20/2011   History of pulmonary embolus (PE) 12/20/2011   Morbid obesity ([place]) 12/20/2011   Preoperative cardiovascular examination 12/20/2011   Type 2 diabetes mellitus ([place]) 09/11/2006  PCP:  Caprice Renshaw, MD Pharmacy:   [place], [place]  [address] Phone: [phone] Fax: [place], [place] [address] [address] Phone: [phone] Fax: [phone]     Social Determinants of Health (SDOH) Interventions    Readmission Risk Interventions    10/07/2021   11:49 AM  Readmission Risk Prevention Plan   Transportation Screening Complete  HRI or [place] Complete  Social Work Consult for [place] Planning/Counseling Complete  Palliative Care Screening Not Applicable  Medication Review Press photographer) Complete

## 2021-10-07 NOTE — Progress Notes (Signed)
PROGRESS NOTE    Patient: [name]                            PCP: Caprice Renshaw, MD                    DOB: 1950/08/29            DOA: 10/06/2021 [id number]             DOS: 10/07/2021, 11:37 AM   LOS: 0 days   Date of Service: The patient was seen and examined on 10/07/2021  Subjective:   The patient was seen and examined this morning, awake and alert.  Patient able to communicate but unable to name any objects or state her name. When asked if she is aware of all and knows the name she nods yes but she cannot express it with words.  Per nursing staff much more awake, eating breakfast  Speech at bedside  Brief Narrative:   [name] is a 71 y.o. female with PMH significant for DM2, HTN, history of PE.Marland KitchenMarland KitchenPatient was sent to the ED complaint of altered mentation. Per report, at the nursing facility, patient has not been acting herself, speaking or moving extremities less than normal for more than 24 hours.   Per son, typically patient is chatty. Patient was last hospitalized 7/10 to 7/18 for similar symptoms.  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  Eliquis was stopped.  Her mental status gradually improved and she was discharged back to facility.  Per family, patient had similar symptoms in the past as well when she had UTI.   ED course : The patient had a low-grade temperature of 90.5, heart rate over 110, blood pressure elevated to 171/81, breathing on room air. Labs with potassium elevated to 5.5, glucose 167, BUN/creatinine 33/1.43, hemoglobin 9.5 Urinalysis with hazy yellow urine with moderate amount of leukocytes, rare bacteria   While in the ED, patient was noted to have profuse diarrhea. C. difficile antigen positive, toxin negative. CT head showed resolution of previous right hippocampal hemorrhage. CT abdomen pelvis did not show any evidence of interested in obstruction or pneumoperitoneum. - There is mild diffuse wall thickening in  rectum along with moderate amount of stool in colon.  It showed possibility of stercoral colitis.     At the time of my evaluation, patient was brought up in bed.  Alert, awake, acknowledge my presence, only able to tell her name.  Tries to verbalize some words but only ends up saying 'Happ'.  Able to follow motor commands however.  No family at bedside at the time of my evaluation  Assessment/Plan:  Principal Problem:   Acute metabolic encephalopathy Active Problems:   Altered mental status Acute metabolic encephalopathy   Primarily presented with altered mental status, decreased alertness for more than 24 hours.  Patient had similar presentation last month and was noted to have intracranial hemorrhage.  Per family, patient had similar symptoms with UTI as well.   Urinalysis consistent with UTI at this time. CT head this time did not show any new finding and showed resolution of the previously noted ICH. Her most remarkable neurological symptoms at this time is expressive aphasia.  She had MRI brain and MRA head and neck done 5 weeks ago only.  I do not see any yield of repeating the same at this time.  Continue monitor clinically. PTA  on Neurontin 100 mg 3 times daily, Remeron 7.5 mg at bedtime Continue the same   C. difficile infection -Diarrhea has improved Primarily presented with altered mentation.  No fever, WBC count normal.   While in the ED, patient was noted to have profuse diarrhea. Stool assay with C. difficile antigen positive, toxin negative. Based on the severity of her symptoms, I would empirically start her on treatment for C. difficile.  Unable to start vancomycin because of allergy (hives).  Started on Dificid.  C. difficile PCR in process. Patient needed fecal bag placement in the ED.  Continue monitor diarrhea frequency, severity IV hydration with normal saline   Type 2 diabetes mellitus A1c 5.6 PTA on metformin 500 mg twice daily, Keep it on hold.  Start on  sliding scale insulin with Accu-Cheks.   Chronic diastolic CHF  hypertension Echo 08/30/2021 showed EF more than 75%, G1DD, hyperdynamic right ventricle.  Clinically patient has significant bilateral lower extremity edema.  Patient is unable to clarify the chronicity of it.  Not on diuretics. PTA on verapamil 60 mg 3 times daily, hydralazine 50 mg 3 times daily, lisinopril 50 mg daily. Resume all.  Continue monitor   HLD Continue Lipitor   History of PE Not on anticoagulation since ICH last admission   CKD 3b Creatinine remains at baseline Lab Results  Component Value Date   CREATININE 1.10 (H) 10/07/2021   CREATININE 1.43 (H) 10/06/2021   CREATININE 1.43 (H) 09/08/2021    Chronic anemia Continue iron supplement.  Continue to trend hemoglobin.    Latest Ref Rng & Units 10/07/2021    4:21 AM 10/06/2021   12:12 PM 09/08/2021    1:24 PM  CBC  WBC 4.0 - 10.5 K/uL 9.4  10.0  10.1   Hemoglobin 12.0 - 15.0 g/dL 9.6  9.5  11.2   Hematocrit 36.0 - 46.0 % 31.7  32.2  36.3   Platelets 150 - 400 K/uL 259  300  249     Adrenal adenoma There is 2.6 cm right adrenal nodule which measured 1.8 cm in the study done on 09/24/2001, most likely adenoma.    Mobility -PT eval once diarrhea is controlled   Goals of care - -  Code Status: Prior unable to clarify CODE STATUS.  In the most recent hospitalization, patient was full code.  Prior to that she was in a DNR status.  Ordered full code for now.  Nutritional status:  The patient's BMI is: Body mass index is 35.12 kg/m. I agree with the assessment and plan as outlined -      Skin Assessment: I have examined the patient's skin and I agree with the wound assessment as performed by wound care team As outlined belowe: Pressure Injury 08/25/20 Buttocks Left;Medial Stage 2 -  Partial thickness loss of dermis presenting as a shallow open injury with a red, pink wound bed without slough. (Active)  08/25/20 2100  Location: Buttocks   Location Orientation: Left;Medial  Staging: Stage 2 -  Partial thickness loss of dermis presenting as a shallow open injury with a red, pink wound bed without slough.  Wound Description (Comments):   Present on Admission:   Dressing Type Foam - Lift dressing to assess site every shift 10/07/21 0756     Pressure Injury 08/25/20 Coccyx Mid Stage 2 -  Partial thickness loss of dermis presenting as a shallow open injury with a red, pink wound bed without slough. (Active)  08/25/20 2100  Location: Coccyx  Location  Orientation: Mid  Staging: Stage 2 -  Partial thickness loss of dermis presenting as a shallow open injury with a red, pink wound bed without slough.  Wound Description (Comments):   Present on Admission: Yes     Pressure Injury 10/07/21 Thigh Distal;Posterior;Right Stage 2 -  Partial thickness loss of dermis presenting as a shallow open injury with a red, pink wound bed without slough. 3.5cm x 1cm (Active)  10/07/21 0223  Location: Thigh  Location Orientation: Distal;Posterior;Right  Staging: Stage 2 -  Partial thickness loss of dermis presenting as a shallow open injury with a red, pink wound bed without slough.  Wound Description (Comments): 3.5cm x 1cm  Present on Admission: Yes  Dressing Type Foam - Lift dressing to assess site every shift 10/07/21 0756     Pressure Injury 10/07/21 Thigh Posterior;Right Stage 2 -  Partial thickness loss of dermis presenting as a shallow open injury with a red, pink wound bed without slough. (Active)  10/07/21 0226  Location: Thigh  Location Orientation: Posterior;Right  Staging: Stage 2 -  Partial thickness loss of dermis presenting as a shallow open injury with a red, pink wound bed without slough.  Wound Description (Comments):   Present on Admission: Yes  Dressing Type Foam - Lift dressing to assess site every shift 10/07/21 0756     ---------------------------------------------------------------------------------------------------------------------------------------  DVT prophylaxis:  enoxaparin (LOVENOX) injection 40 mg Start: 10/07/21 1000   Code Status:   Code Status: DNR  Family Communication: No family member present at bedside- attempt will be made to update daily The above findings and plan of care has been discussed with patient (and family)  in detail,  they expressed understanding and agreement of above. -Advance care planning has been discussed.   Admission status:   Status is: Inpatient Remains inpatient appropriate because: Continue evaluation by speech, neuro, and treatment for C. difficile colitis, dehydration  Disposition: From facility, planning to discharge within next 24-48 hours patient remained stable   Procedures:   No admission procedures for hospital encounter.   Antimicrobials:  Anti-infectives (From admission, onward)    Start     Dose/Rate Route Frequency Ordered Stop   10/06/21 2200  fidaxomicin (DIFICID) tablet 200 mg        200 mg Oral 2 times daily 10/06/21 1943 10/16/21 2159   10/06/21 1515  cefTRIAXone (ROCEPHIN) 1 g in sodium chloride 0.9 % 100 mL IVPB        1 g 200 mL/hr over 30 Minutes Intravenous  Once 10/06/21 1509 10/06/21 1652        Medication:   aspirin EC  81 mg Oral Daily   Chlorhexidine Gluconate Cloth  6 each Topical Daily   cholecalciferol  1,000 Units Oral Daily   enoxaparin (LOVENOX) injection  40 mg Subcutaneous Q24H   fidaxomicin  200 mg Oral BID   gabapentin  100 mg Oral TID   hydrALAZINE  50 mg Oral Q8H   insulin aspart  0-5 Units Subcutaneous QHS   insulin aspart  0-9 Units Subcutaneous TID WC   lactobacillus  1 g Oral TID WC   pantoprazole  40 mg Oral Daily   verapamil  60 mg Oral Q8H    acetaminophen **OR** acetaminophen, albuterol, hydrALAZINE, mouth rinse   Objective:   Vitals:   10/07/21 0800 10/07/21 0900 10/07/21 0943  10/07/21 1000  BP: (!) 166/77 (!) 180/157 (!) 155/36 (!) 144/40  Pulse: (!) 101 (!) 105 (!) 114 (!) 114  Resp: (!) 24 18  18 20  Temp:      TempSrc:      SpO2:  97% 98% 98%  Weight:      Height:        Intake/Output Summary (Last 24 hours) at 10/07/2021 1137 Last data filed at 10/07/2021 0543 Gross per 24 hour  Intake 1486.67 ml  Output 450 ml  Net 1036.67 ml   Filed Weights   10/06/21 2240 10/07/21 0500  Weight: 98.7 kg 98.7 kg     Examination:   Physical Exam  Constitution:  Alert, cooperative, no distress,  Appears calm and comfortable  Psychiatric:   Normal and stable mood and affect, cognition intact,   HEENT:        Normocephalic, PERRL, otherwise with in Normal limits  Chest:         Chest symmetric Cardio vascular:  S1/S2, RRR, No murmure, No Rubs or Gallops  pulmonary: Clear to auscultation bilaterally, respirations unlabored, negative wheezes / crackles Abdomen: Soft, non-tender, non-distended, bowel sounds,no masses, no organomegaly Muscular skeletal: Severe global generalized weaknesses  Limited exam - in bed, able to move all 4 extremities,   Neuro: Expressive aphasia, awake alert oriented -possible cognitive deficits  CNII-XII intact. , normal motor and sensation, reflexes intact  Extremities: No pitting edema lower extremities, +2 pulses  Skin: Dry, warm to touch, negative for any Rashes, No open wounds Wounds: per nursing documentation   ------------------------------------------------------------------------------------------------------------------------------------------    LABs:     Latest Ref Rng & Units 10/07/2021    4:21 AM 10/06/2021   12:12 PM 09/08/2021    1:24 PM  CBC  WBC 4.0 - 10.5 K/uL 9.4  10.0  10.1   Hemoglobin 12.0 - 15.0 g/dL 9.6  9.5  11.2   Hematocrit 36.0 - 46.0 % 31.7  32.2  36.3   Platelets 150 - 400 K/uL 259  300  249       Latest Ref Rng & Units 10/07/2021    4:21 AM 10/06/2021    2:25 PM 10/06/2021   12:12 PM  CMP   Glucose 70 - 99 mg/dL 137   167   BUN 8 - 23 mg/dL 26   33   Creatinine 0.44 - 1.00 mg/dL 1.10   1.43   Sodium 135 - 145 mmol/L 144   143   Potassium 3.5 - 5.1 mmol/L 4.2  5.1  5.5   Chloride 98 - 111 mmol/L 111   110   CO2 22 - 32 mmol/L 24   27   Calcium 8.9 - 10.3 mg/dL 8.4   8.5   Total Protein 6.5 - 8.1 g/dL   7.0   Total Bilirubin 0.3 - 1.2 mg/dL   1.1   Alkaline Phos 38 - 126 U/L   56   AST 15 - 41 U/L   22   ALT 0 - 44 U/L   16        Micro Results Recent Results (from the past 240 hour(s))  SARS Coronavirus 2 by RT PCR (hospital order, performed in [facility] hospital lab) *cepheid single result test* Anterior Nasal Swab     Status: None   Collection Time: 10/06/21 12:12 PM   Specimen: Anterior Nasal Swab  Result Value Ref Range Status   SARS Coronavirus 2 by RT PCR NEGATIVE NEGATIVE Final    Comment: (NOTE) SARS-CoV-2 target nucleic acids are NOT DETECTED.  The SARS-CoV-2 RNA is generally detectable in upper and lower respiratory specimens during the acute phase of infection. The  lowest concentration of SARS-CoV-2 viral copies this assay can detect is 250 copies / mL. A negative result does not preclude SARS-CoV-2 infection and should not be used as the sole basis for treatment or other patient management decisions.  A negative result may occur with improper specimen collection / handling, submission of specimen other than nasopharyngeal swab, presence of viral mutation(s) within the areas targeted by this assay, and inadequate number of viral copies (<250 copies / mL). A negative result must be combined with clinical observations, patient history, and epidemiological information.  Fact Sheet for Patients:   https://www.patel.info/  Fact Sheet for Healthcare Providers: https://hall.com/  This test is not yet approved or  cleared by the [place] FDA and has been authorized for detection and/or diagnosis of  SARS-CoV-2 by FDA under an Emergency Use Authorization (EUA).  This EUA will remain in effect (meaning this test can be used) for the duration of the COVID-19 declaration under Section 564(b)(1) of the Act, 21 U.S.C. section 360bbb-3(b)(1), unless the authorization is terminated or revoked sooner.  Performed at [facility], [address], [place], [place] [address]   C Difficile Quick Screen w PCR reflex     Status: Abnormal   Collection Time: 10/06/21  4:51 PM   Specimen: STOOL  Result Value Ref Range Status   C Diff antigen POSITIVE (A) NEGATIVE Final   C Diff toxin NEGATIVE NEGATIVE Final   C Diff interpretation Results are indeterminate. See PCR results.  Final    Comment: Performed at [facility], [address], [place], [place] [address]  C. Diff by PCR, Reflexed     Status: Abnormal   Collection Time: 10/06/21  4:51 PM  Result Value Ref Range Status   Toxigenic C. Difficile by PCR POSITIVE (A) NEGATIVE Final    Comment: Positive for toxigenic C. difficile with little to no toxin production. Only treat if clinical presentation suggests symptomatic illness. Performed at [facility], [place] [address], [place], [place] [address]   MRSA Next Gen by PCR, Nasal     Status: None   Collection Time: 10/06/21 10:31 PM   Specimen: Nasal Mucosa; Nasal Swab  Result Value Ref Range Status   MRSA by PCR Next Gen NOT DETECTED NOT DETECTED Final    Comment: (NOTE) The GeneXpert MRSA Assay (FDA approved for NASAL specimens only), is one component of a comprehensive MRSA colonization surveillance program. It is not intended to diagnose MRSA infection nor to guide or monitor treatment for MRSA infections. Test performance is not FDA approved in patients less than 38 years old. Performed at [facility], [address] [place], [place] [address]     Radiology Reports CT Head Wo Contrast  Result Date: 10/06/2021 CLINICAL DATA:  Altered mental status, recent right hippocampal  hemorrhage EXAM: CT HEAD WITHOUT CONTRAST TECHNIQUE: Contiguous axial images were obtained from the base of the skull through the vertex without intravenous contrast. RADIATION DOSE REDUCTION: This exam was performed according to the departmental dose-optimization program which includes automated exposure control, adjustment of the mA and/or kV according to patient size and/or use of iterative reconstruction technique. COMPARISON:  09/08/2021 FINDINGS: Brain: No evidence of acute infarction, acute hemorrhage, hydrocephalus, extra-axial collection or mass lesion/mass effect. A previously seen right hippocampal hemorrhage is resolved, with residual hypodensity in this vicinity (series 2, image 13). No new hemorrhage. Periventricular and deep white matter hypodensity. Lacunar infarction of the left corona radiata (series 2, image 19). Vascular: No hyperdense vessel or unexpected  calcification. Skull: Normal. Negative for fracture or focal lesion. Sinuses/Orbits: No acute finding. Other: None. IMPRESSION: 1. A previously seen right hippocampal hemorrhage is resolved, with residual hypodensity in this vicinity most likely reflecting developing encephalomalacia. No new hemorrhage. 2. 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 Electronically Signed   By: Delanna Ahmadi M.D.   On: 10/06/2021 12:40    SIGNED: Deatra James, MD, FHM. [facility],  Pager (please use [email or web address] to page/text) Please use Epic Secure Chat for non-urgent communication (7AM-7PM)  If 7PM-7AM, please contact night-coverage [email or web address], 10/07/2021, 11:37 AM

## 2021-10-07 NOTE — Evaluation (Signed)
Physical Therapy Evaluation Patient Details Name: [name] MRN: [id number] DOB: Dec 04, 1950 Today's Date: 10/07/2021  History of Present Illness  [name] is a 71 y.o. female with PMH significant for DM2, HTN, history of PE,  Patient was sent to the ED complaint of altered mentation.  Per report, at the nursing facility, patient has not been acting herself, speaking or moving extremities less than normal for more than 24 hours.  Per son, typically patient is chatty.  Patient was last hospitalized 7/10 to 7/18 for similar symptoms.  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  Eliquis was stopped.  Her mental status gradually improved and she was discharged back to facility.  Per family, patient had similar symptoms in the past as well when she had UTI.   Clinical Impression  Patient functioning near baseline for functional mobility and gait demonstrating slow labored movement for sitting up at bedside with poor trunk control and frequent falling backwards.  Patient non-ambulatory and uses mechanical lift for transfers, able to propel self in her high back manual wheelchair per patient.  Patient required Max/total assist to reposition with bed in head down position when put back to bed.  Patient will benefit from continued skilled physical therapy in hospital and recommended venue below to increase strength, balance, endurance for safe ADLs and gait.         Recommendations for follow up therapy are one component of a multi-disciplinary discharge planning process, led by the attending physician.  Recommendations may be updated based on patient status, additional functional criteria and insurance authorization.  Follow Up Recommendations Skilled nursing-short term rehab (<3 hours/day) Can patient physically be transported by private vehicle: No    Assistance Recommended at Discharge Intermittent Supervision/Assistance  Patient can return home with the following  A lot  of help with bathing/dressing/bathroom;A lot of help with walking and/or transfers;Help with stairs or ramp for entrance;Assistance with cooking/housework    Equipment Recommendations None recommended by PT  Recommendations for Other Services       Functional Status Assessment Patient has had a recent decline in their functional status and/or demonstrates limited ability to make significant improvements in function in a reasonable and predictable amount of time     Precautions / Restrictions Precautions Precautions: Fall Restrictions Weight Bearing Restrictions: No      Mobility  Bed Mobility Overal bed mobility: Needs Assistance Bed Mobility: Supine to Sit, Sit to Supine     Supine to sit: Mod assist, Max assist Sit to supine: Max assist   General bed mobility comments: slow labored movement with limited use of BLE due to weakness    Transfers                        Ambulation/Gait                  Stairs            Wheelchair Mobility    Modified Rankin (Stroke Patients Only)       Balance Overall balance assessment: Needs assistance Sitting-balance support: Feet supported, Bilateral upper extremity supported Sitting balance-Leahy Scale: Poor Sitting balance - Comments: fair/poor seated at EOB supporting self with BUE                                     Pertinent Vitals/Pain Pain Assessment Pain  Assessment: No/denies pain    Home Living Family/patient expects to be discharged to:: Skilled nursing facility                        Prior Function Prior Level of Function : Needs assist       Physical Assist : Mobility (physical);ADLs (physical) Mobility (physical): Bed mobility;Transfers   Mobility Comments: Non-ambulatory, uses mechanical lift for transfers, able propel self in high back wheelchair using legs ADLs Comments: assisted by SNF staff     Hand Dominance   Dominant Hand: Right     Extremity/Trunk Assessment   Upper Extremity Assessment Upper Extremity Assessment: Generalized weakness    Lower Extremity Assessment Lower Extremity Assessment: Generalized weakness    Cervical / Trunk Assessment Cervical / Trunk Assessment: Kyphotic  Communication   Communication: No difficulties  Cognition Arousal/Alertness: Awake/alert Behavior During Therapy: WFL for tasks assessed/performed Overall Cognitive Status: Within Functional Limits for tasks assessed                                          General Comments      Exercises     Assessment/Plan    PT Assessment Patient needs continued PT services  PT Problem List Decreased strength;Decreased mobility;Decreased balance;Decreased activity tolerance       PT Treatment Interventions DME instruction;Functional mobility training;Therapeutic activities;Therapeutic exercise;Patient/family education;Wheelchair mobility training;Balance training    PT Goals (Current goals can be found in the Care Plan section)  Acute Rehab PT Goals Patient Stated Goal: returtn to LTC SNF able to use her highback wheelchair PT Goal Formulation: With patient Time For Goal Achievement: 10/21/21 Potential to Achieve Goals: Good    Frequency Min 2X/week     Co-evaluation               AM-PAC PT "6 Clicks" Mobility  Outcome Measure Help needed turning from your back to your side while in a flat bed without using bedrails?: A Lot Help needed moving from lying on your back to sitting on the side of a flat bed without using bedrails?: A Lot Help needed moving to and from a bed to a chair (including a wheelchair)?: Total Help needed standing up from a chair using your arms (e.g., wheelchair or bedside chair)?: Total Help needed to walk in hospital room?: Total Help needed climbing 3-5 steps with a railing? : Total 6 Click Score: 8    End of Session   Activity Tolerance: Patient tolerated treatment  well;Patient limited by fatigue Patient left: in bed;with call bell/phone within reach Nurse Communication: Mobility status PT Visit Diagnosis: Unsteadiness on feet (R26.81);Other abnormalities of gait and mobility (R26.89);Muscle weakness (generalized) (M62.81)    Time: 1106-1130 PT Time Calculation (min) (ACUTE ONLY): 24 min   Charges:   PT Evaluation $PT Eval Moderate Complexity: 1 Mod PT Treatments $Therapeutic Activity: 23-37 mins        2:15 PM, 10/07/21 Lonell Grandchild, MPT Physical Therapist with [facility] 336 [phone] office [phone] mobile phone

## 2021-10-07 NOTE — Plan of Care (Signed)
  Problem: Clinical Measurements: Goal: Will remain free from infection 10/07/2021 1048 by Paulina Fusi, RN Outcome: Not Progressing Variance Physical/mental limitations Impact: Moderate 10/07/2021 1043 by Paulina Fusi, RN Note: Patient's son educated (via phone call) about patient resulting positive for c-diff and need for contact precautions to protect bedside staff, other patients, and pt's family. Additionally, noted that there are no restrictions on visitation, just proper protective measures will be put in place upon visitation, including gowning,limit personal items brought into room, and washing hands with soap and water when exiting the room.  Patient is not receptive to learning at this time. 10/07/2021 0847 by Paulina Fusi, RN Outcome: Not Progressing

## 2021-10-07 NOTE — Hospital Course (Addendum)
[name] is a 71 y.o. female with PMH significant for DM2, HTN, history of PE.Marland KitchenMarland KitchenPatient was sent to the ED complaint of altered mentation. Per report, at the nursing facility, patient has not been acting herself, speaking or moving extremities less than normal for more than 24 hours.   Per son, typically patient is chatty. Patient was last hospitalized 7/10 to 7/18 for similar symptoms.  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  Eliquis was stopped.  Her mental status gradually improved and she was discharged back to facility.  Per family, patient had similar symptoms in the past as well when she had UTI.   ED course : The patient had a low-grade temperature of 90.5, heart rate over 110, blood pressure elevated to 171/81, breathing on room air. Labs with potassium elevated to 5.5, glucose 167, BUN/creatinine 33/1.43, hemoglobin 9.5 Urinalysis with hazy yellow urine with moderate amount of leukocytes, rare bacteria   While in the ED, patient was noted to have profuse diarrhea. C. difficile positive,  CT head showed resolution of previous right hippocampal hemorrhage. CT abdomen pelvis did not show any evidence of interested in obstruction or pneumoperitoneum. - There is mild diffuse wall thickening in rectum along with moderate amount of stool in colon.  It showed possibility of stercoral colitis.     At the time of my evaluation, patient was brought up in bed.  Alert, awake, acknowledge my presence, only able to tell her name.  Tries to verbalize some words but only ends up saying 'Antrobus'.  Able to follow motor commands however.  No family at bedside at the time of my evaluation  Assessment/Plan:  Principal Problem:   Acute metabolic encephalopathy Recurrent new CVA Active Problems: C. difficile colitis Altered mental status   Acute metabolic encephalopathy More awake -alert, pronounced depressive Primarily presented with altered mental status, decreased  alertness for more than 24 hours.  Patient had similar presentation last month and was noted to have intracranial hemorrhage.  Per family, patient had similar symptoms with UTI as well.   Urinalysis consistent with UTI at this time. CT head this time did not show any new finding  Subsequent CT of the head on 10/08/2021 reported evolving stroke site left frontal area Her most remarkable neurological symptoms at this time is expressive aphasia.     Recurrent new CVA :  With main manifestation of aphasia--mild improvement today -Patient had a stroke approximately a month ago, was placed on aspirin, statins -As it was thought that with small hemorrhagic stroke patient's home medication of Eliquis was discontinued (initially was prescribed for PE) -On 10/09/2018 3 AM on-call neurologist from [facility] Dr. Quinn Axe was notified, consulted Repeat CT of the head revealed:  Evolving acute to early subacute infarct in the left frontal operculum without associated hemorrhage or mass effect  -Dr. Quinn Axe evaluated the patient via telemetry neurology consult, discussed with Dr. Erlinda Hong -determined the patient is to be back on Eliquis Further stroke work-up needing MR I/MRA -Patient's son requested patient to be transferred to [facility]  Pending transfer  C. difficile infection -Improved diarrhea, discontinue rectal tube, afebrile normotensive no leukocytosis - Unable to start vancomycin because of allergy (hives).   Started on Dificid.  C. difficile PCR ++  IV hydration with normal saline   Type 2 diabetes mellitus A1c 5.6 PTA on metformin 500 mg twice daily, Keep it on hold.  Start on sliding scale insulin with Accu-Cheks.   Chronic  diastolic CHF  / Hypertension Echo 08/30/2021 showed EF more than 75%, G1DD, hyperdynamic right ventricle.  Clinically patient has significant bilateral lower extremity edema.  Patient is unable to clarify the chronicity of it.  Not on diuretics. PTA on verapamil 60 mg 3 times  daily, hydralazine 50 mg 3 times daily, lisinopril 50 mg daily. Resume all.  Continue monitor   HLD - Continue Lipitor   History of PE - Resuming Eliquis (per neurology safe to restart)   CKD 3b - Creatinine remains at baseline Lab Results  Component Value Date   CREATININE 1.06 (H) 10/09/2021   CREATININE 1.02 (H) 10/08/2021   CREATININE 1.10 (H) 10/07/2021      Chronic anemia Continue iron supplement.  Continue to trend hemoglobin.    Latest Ref Rng & Units 10/09/2021    4:19 AM 10/08/2021    5:58 AM 10/07/2021    4:21 AM  CBC  WBC 4.0 - 10.5 K/uL 7.4  8.7  9.4   Hemoglobin 12.0 - 15.0 g/dL 9.1  10.2  9.6   Hematocrit 36.0 - 46.0 % 30.8  34.8  31.7   Platelets 150 - 400 K/uL 263  302  259     Adrenal adenoma There is 2.6 cm right adrenal nodule which measured 1.8 cm in the study done on 09/24/2001, most likely adenoma.    Mobility -PT eval once diarrhea is controlled   Goals of care - -  Code Status: Prior unable to clarify CODE STATUS.  In the most recent hospitalization, patient was full code.  Prior to that she was in a DNR status.  Ordered full code for now.

## 2021-10-07 NOTE — Evaluation (Signed)
Clinical/Bedside Swallow Evaluation Patient Details  Name: [name] MRN: [id number] Date of Birth: 1950/07/18  Today's Date: 10/07/2021 Time: SLP Start Time (ACUTE ONLY): 0800 SLP Stop Time (ACUTE ONLY): 0826 SLP Time Calculation (min) (ACUTE ONLY): 26 min  Past Medical History:  Past Medical History:  Diagnosis Date   Cellulitis lower extremities   4 day hospitalization 12/25/14-12/29/14   Essential hypertension, benign    History of MRSA infection    History of panniculitis    Pulmonary emboli ([place]) 2009   Skin ulcer of thigh ([facility])    Type 2 diabetes mellitus ([place])    Past Surgical History:  Past Surgical History:  Procedure Laterality Date   PARTIAL HYSTERECTOMY     Right thigh ulcer debridement  2008   Two occasions - wound VAC   HPI:  [name] is a 71 y.o. female with PMH significant for DM2, HTN, history of PE,  Patient was sent to the ED complaint of altered mentation.  Per report, at the nursing facility, patient has not been acting herself, speaking or moving extremities less than normal for more than 24 hours.  Per son, typically patient is chatty.  Patient was last hospitalized 7/10 to 7/18 for similar symptoms.  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  Eliquis was stopped.  Her mental status gradually improved and she was discharged back to facility.  Per family, patient had similar symptoms in the past as well when she had UTI. Cognitive evaluation completed early in July of 2023: "Pt demonstrates significant cognitive impiarment including right sided neglect poorly focused attention, requiring repetition and visual cues for occasional responses to simple questions. Pt does not follow one step commands." She was also seen during that hospital admission for dysphagia. BSE is requested today    Assessment / Plan / Recommendation  Clinical Impression  Clinical swallowing evaluation completed while Pt was sitting upright in bed; Pt is pleasant  and easily roused. She is responsive but unable to tell me her name. She has aphasia and significant cognitive impairment at baseline per ST notes/history. Pt continues to present with cognitive based dysphagia (which has also been documented in her history) characterized by slow and prolonged oral prep and mastication of regular textures. Pt was assessed with her breakfast tray; Pt fed herself and demonstrated no overt s/sx of aspiration. Pt benefits from 1:1 support and cues for swallowing and alternating bites and sips throughout meals. Recommend meds be crushed in puree. Recommend continue with regular diet and thin liquids; ST will f/u X1 to ensure diet tolerance. Thank you, SLP Visit Diagnosis: Dysphagia, unspecified (R13.10)    Aspiration Risk  Mild aspiration risk    Diet Recommendation Regular;Thin liquid   Liquid Administration via: Cup;Straw Medication Administration: Crushed with puree Supervision: Patient able to self feed;Intermittent supervision to cue for compensatory strategies;Full supervision/cueing for compensatory strategies Compensations: Minimize environmental distractions;Slow rate;Small sips/bites Postural Changes: Seated upright at 90 degrees    Other  Recommendations Oral Care Recommendations: Oral care BID Other Recommendations: Remove water pitcher    Recommendations for follow up therapy are one component of a multi-disciplinary discharge planning process, led by the attending physician.  Recommendations may be updated based on patient status, additional functional criteria and insurance authorization.           Frequency and Duration min 1 x/week  1 week       Prognosis Prognosis for Safe Diet Advancement: Good Barriers to Reach Goals:  Cognitive deficits      Swallow Study   General HPI: [name] is a 71 y.o. female with PMH significant for DM2, HTN, history of PE,  Patient was sent to the ED complaint of altered mentation.  Per report, at the  nursing facility, patient has not been acting herself, speaking or moving extremities less than normal for more than 24 hours.  Per son, typically patient is chatty.  Patient was last hospitalized 7/10 to 7/18 for similar symptoms.  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  Eliquis was stopped.  Her mental status gradually improved and she was discharged back to facility.  Per family, patient had similar symptoms in the past as well when she had UTI. Cognitive evaluation completed early in July of 2023: "Pt demonstrates significant cognitive impiarment including right sided neglect poorly focused attention, requiring repetition and visual cues for occasional responses to simple questions. Pt does not follow one step commands." She was also seen during that hospital admission for dysphagia. BSE is requested today Type of Study: Bedside Swallow Evaluation Previous Swallow Assessment: BSE 09/06/2021 Diet Prior to this Study: Regular;Thin liquids Temperature Spikes Noted: No History of Recent Intubation: No Behavior/Cognition: Alert;Cooperative;Pleasant mood;Confused;Requires cueing;Doesn't follow directions Oral Cavity Assessment: Within Functional Limits Oral Cavity - Dentition: Dentures, top;Dentures, bottom Vision: Functional for self-feeding Self-Feeding Abilities: Able to feed self Patient Positioning: Upright in bed Baseline Vocal Quality: Normal Volitional Cough: Strong Volitional Swallow: Able to elicit    Oral/Motor/Sensory Function Overall Oral Motor/Sensory Function: Within functional limits   Ice Chips Ice chips: Within functional limits   Thin Liquid Thin Liquid: Within functional limits    Nectar Thick Nectar Thick Liquid: Not tested   Honey Thick Honey Thick Liquid: Not tested   Puree     Solid     Solid: Impaired Presentation: Spoon Oral Phase Impairments: Impaired mastication;Reduced lingual movement/coordination Oral Phase Functional Implications:  Prolonged oral transit;Impaired mastication;Oral residue       [name] Brune H. Roddie [name], CCC-SLP Speech Language Pathologist  [name] 10/07/2021,8:26 AM

## 2021-10-07 NOTE — Progress Notes (Signed)
Patient assessed for breathing treatment. Patient is 96% on room air and sleeping well at this time. No history of COPD or asthma noted. Clear slightly diminished breath sounds on auscultation. Changed order to PRN.

## 2021-10-07 NOTE — Plan of Care (Signed)
  Problem: Acute Rehab PT Goals(only PT should resolve) Goal: Pt will Roll Supine to Side Outcome: Progressing Flowsheets (Taken 10/07/2021 1417) Pt will Roll Supine to Side: with mod assist Goal: Pt Will Go Supine/Side To Sit Outcome: Progressing Flowsheets (Taken 10/07/2021 1417) Pt will go Supine/Side to Sit: with moderate assist Goal: Pt Will Go Sit To Supine/Side Outcome: Progressing Flowsheets (Taken 10/07/2021 1417) Pt will go Sit to Supine/Side: with moderate assist Goal: Patient Will Perform Sitting Balance Outcome: Progressing Flowsheets (Taken 10/07/2021 1417) Patient will perform sitting balance:  with min guard assist  with minimal assist   2:18 PM, 10/07/21 Lonell Grandchild, MPT Physical Therapist with [facility] [phone] office [phone] mobile phone

## 2021-10-08 ENCOUNTER — Inpatient Hospital Stay (HOSPITAL_COMMUNITY): Payer: Medicare (Managed Care)

## 2021-10-08 DIAGNOSIS — G9341 Metabolic encephalopathy: Secondary | ICD-10-CM | POA: Diagnosis not present

## 2021-10-08 LAB — CBC
HCT: 34.8 % — ABNORMAL LOW (ref 36.0–46.0)
Hemoglobin: 10.2 g/dL — ABNORMAL LOW (ref 12.0–15.0)
MCH: 26.4 pg (ref 26.0–34.0)
MCHC: 29.3 g/dL — ABNORMAL LOW (ref 30.0–36.0)
MCV: 89.9 fL (ref 80.0–100.0)
Platelets: 302 10*3/uL (ref 150–400)
RBC: 3.87 MIL/uL (ref 3.87–5.11)
RDW: 17.4 % — ABNORMAL HIGH (ref 11.5–15.5)
WBC: 8.7 10*3/uL (ref 4.0–10.5)
nRBC: 0 % (ref 0.0–0.2)

## 2021-10-08 LAB — BASIC METABOLIC PANEL
Anion gap: 7 (ref 5–15)
BUN: 17 mg/dL (ref 8–23)
CO2: 25 mmol/L (ref 22–32)
Calcium: 8.2 mg/dL — ABNORMAL LOW (ref 8.9–10.3)
Chloride: 112 mmol/L — ABNORMAL HIGH (ref 98–111)
Creatinine, Ser: 1.02 mg/dL — ABNORMAL HIGH (ref 0.44–1.00)
GFR, Estimated: 59 mL/min — ABNORMAL LOW (ref >60)
Glucose, Bld: 150 mg/dL — ABNORMAL HIGH (ref 70–99)
Potassium: 4.1 mmol/L (ref 3.5–5.1)
Sodium: 144 mmol/L (ref 135–145)

## 2021-10-08 LAB — GLUCOSE, CAPILLARY
Glucose-Capillary: 163 mg/dL — ABNORMAL HIGH (ref 70–99)
Glucose-Capillary: 187 mg/dL — ABNORMAL HIGH (ref 70–99)
Glucose-Capillary: 111 mg/dL — ABNORMAL HIGH (ref 70–99)
Glucose-Capillary: 220 mg/dL — ABNORMAL HIGH (ref 70–99)

## 2021-10-08 MED ORDER — SODIUM CHLORIDE 0.9 % IV SOLN
1.0000 g | INTRAVENOUS | Status: DC
  Administered 2021-10-08 – 2021-10-10 (×3): 1 g via INTRAVENOUS
  Filled 2021-10-08 (×3): qty 10

## 2021-10-08 MED ORDER — APIXABAN 5 MG PO TABS
5.0000 mg | ORAL_TABLET | Freq: Two times a day (BID) | ORAL | Status: DC
  Administered 2021-10-08 – 2021-10-10 (×4): 5 mg via ORAL
  Filled 2021-10-08 (×4): qty 1

## 2021-10-08 MED ORDER — METOPROLOL TARTRATE 25 MG PO TABS
12.5000 mg | ORAL_TABLET | Freq: Two times a day (BID) | ORAL | Status: DC
  Administered 2021-10-08 – 2021-10-10 (×4): 12.5 mg via ORAL
  Filled 2021-10-08 (×4): qty 1

## 2021-10-08 NOTE — Progress Notes (Signed)
ANTICOAGULATION CONSULT NOTE - Initial Consult  Pharmacy Consult for eliquis Indication:  embolic CVA  Allergies  Allergen Reactions   Vancomycin Hives and Rash    "big red bumps, then they turned white, then I shedded like a snake."     Patient Measurements: Height: '5\' 6"'$  (167.6 cm) Weight: 98.7 kg (217 lb 9.5 oz) IBW/kg (Calculated) : 59.3  Vital Signs: Temp: 98.5 F (36.9 C) (08/19 1338) Temp Source: Oral (08/19 1338) BP: 141/48 (08/19 1338) Pulse Rate: 93 (08/19 1338)  Labs: Recent Labs    10/06/21 1212 10/06/21 1358 10/07/21 0421 10/08/21 0558  HGB 9.5*  --  9.6* 10.2*  HCT 32.2*  --  31.7* 34.8*  PLT 300  --  259 302  CREATININE 1.43*  --  1.10* 1.02*  TROPONINIHS 19* 18*  --   --     Estimated Creatinine Clearance: 60 mL/min (A) (by C-G formula based on SCr of 1.02 mg/dL (H)).   Medical History: Past Medical History:  Diagnosis Date   Cellulitis lower extremities   4 day hospitalization 12/25/14-12/29/14   Essential hypertension, benign    History of MRSA infection    History of panniculitis    Pulmonary emboli ([place]) 2009   Skin ulcer of thigh (HCC)    Type 2 diabetes mellitus (HCC)     Medications:  Medications Prior to Admission  Medication Sig Dispense Refill Last Dose   acetaminophen (TYLENOL) 650 MG CR tablet Take 1,300 mg by mouth every 8 (eight) hours as needed for pain.      ammonium lactate (LAC-HYDRIN) 12 % lotion Apply topically 2 (two) times daily. 400 g 0    atorvastatin (LIPITOR) 40 MG tablet Take 40 mg by mouth daily.      cholecalciferol (VITAMIN D3) 25 MCG (1000 UNIT) tablet Take 1,000 Units by mouth daily.      feeding supplement, GLUCERNA SHAKE, (GLUCERNA SHAKE) LIQD Take 237 mLs by mouth 3 (three) times daily between meals. (Patient not taking: Reported on 08/29/2021) 237 mL 0    ferrous sulfate 300 (60 Fe) MG/5ML syrup Take 5 mLs (300 mg total) by mouth daily with breakfast. 150 mL 3    fluticasone (FLONASE) 50 MCG/ACT nasal spray  Place 1 spray into both nostrils daily.      gabapentin (NEURONTIN) 100 MG capsule Take 100 mg by mouth 3 (three) times daily.      hydrALAZINE (APRESOLINE) 50 MG tablet Take 1 tablet (50 mg total) by mouth every 8 (eight) hours.      lisinopril (ZESTRIL) 30 MG tablet Take 15 mg by mouth daily.      metFORMIN (GLUCOPHAGE) 1000 MG tablet Take 500 mg by mouth 2 (two) times a day.      mirtazapine (REMERON) 7.5 MG tablet Take 1 tablet (7.5 mg total) by mouth at bedtime. 30 tablet 0    pantoprazole (PROTONIX) 40 MG tablet Take 40 mg by mouth daily.      pantoprazole sodium (PROTONIX) 40 mg Take 40 mg by mouth at bedtime.      senna (SENOKOT) 8.6 MG TABS tablet Take 1 tablet (8.6 mg total) by mouth daily as needed for mild constipation. (Patient not taking: Reported on 08/29/2021) 120 each 0    verapamil (CALAN) 120 MG tablet Take 0.5 tablets (60 mg total) by mouth every 8 (eight) hours.      vitamin C (ASCORBIC ACID) 500 MG tablet Take 500 mg by mouth daily.       Assessment: Patient  presented with aphasia 2 days ago. On admission head CT showed an acute evolving L frontal operculum ischemic stroke, Patient was also admited with hx R hemorrhagic infarct one month ago. Neurology has  reviewed the case and felt that with known hx PE, that it would be reasonable to restart he anticoagulation with eliquis.  The stroke is small enough, that the risk of hemorrhagic conversion is felt to be low. The patient is high risk for another stroke, so will restart eliquis at '5mg'$  BID.  Goal of Therapy:   Monitor platelets by anticoagulation protocol: Yes   Plan:  Eliquis '5mg'$  po bid CBC M,W,F Monitor for S/S of bleeding  Isac Sarna, BS Pharm D, BCPS Clinical Pharmacist 10/08/2021,1:58 PM

## 2021-10-08 NOTE — Plan of Care (Signed)
Neurology telephone note  I was contacted by Dr. Roger Shelter at [facility] regarding this patient with hx R hemorrhagic infarct one month ago who presented with aphasia 2 days ago. On admission head CT showed an acute evolving L frontal operculum ischemic stroke, which is more prominent on repeat head CT today. I discussed her case with Dr. Erlinda Hong the stroke neurologist who took care of her last month at [facility]. He felt that since she has known hx PE, that it would be reasonable to restart her anticoagulation at this time given that her hemorrhage on CT has resolved and she has a new ischemic stroke. The stroke is small enough the risk of hemorrhagic conversion, while present, is felt to be low enough to restart anticoagulation at this time.  Recommendations: - MRI brain wo contrast on Monday. OK to stay at [facility], no need to transfer to [facility] at this time - Goal normotension, avoid hypotension - MRI brain wo contrast - CTA head and neck (preferred if hospitalist ok with GFR 59), otherwise MRA neck wwo contrast. Patient already had MRA head last mo - No indication to repeat TTE or do bubble study at this time - BLE [place] r/o DVT - No antiplatelets in setting of anticoagulation - q4 hr neuro checks - STAT head CT for any change in neuro exam - Tele - PT/OT/SLP - NPO until bedside swallow screen passed and documented - Stroke education - Amb referral to neurology upon discharge if she is not already established  Su Monks, MD [facility] [phone]  If 7pm- 7am, please page neurology on call as listed in [place].

## 2021-10-08 NOTE — Progress Notes (Signed)
PROGRESS NOTE    Patient: Franklin                            PCP: Caprice Renshaw, MD                    DOB: Apr 07, 1950            DOA: 10/06/2021 EHU:[id number]             DOS: 10/08/2021, 10:16 AM   LOS: 1 day   Date of Service: The patient was seen and examined on 10/08/2021  Subjective:   The patient was seen and examined this morning, awake and alert.  Patient able to communicate but unable to name any objects or state her name. When asked if she is aware of all and knows the name she nods yes but she cannot express it with words.  Per nursing staff much more awake, eating breakfast  Speech at bedside  Brief Narrative:   Everest L Stailey is a 71 y.o. female with PMH significant for DM2, HTN, history of PE.Marland KitchenMarland KitchenPatient was sent to the ED complaint of altered mentation. Per report, at the nursing facility, patient has not been acting herself, speaking or moving extremities less than normal for more than 24 hours.   Per son, typically patient is chatty. Patient was last hospitalized 7/10 to 7/18 for similar symptoms.  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  Eliquis was stopped.  Her mental status gradually improved and she was discharged back to facility.  Per family, patient had similar symptoms in the past as well when she had UTI.   ED course : The patient had a low-grade temperature of 90.5, heart rate over 110, blood pressure elevated to 171/81, breathing on room air. Labs with potassium elevated to 5.5, glucose 167, BUN/creatinine 33/1.43, hemoglobin 9.5 Urinalysis with hazy yellow urine with moderate amount of leukocytes, rare bacteria   While in the ED, patient was noted to have profuse diarrhea. C. difficile antigen positive, toxin negative. CT head showed resolution of previous right hippocampal hemorrhage. CT abdomen pelvis did not show any evidence of interested in obstruction or pneumoperitoneum. - There is mild diffuse wall thickening in rectum  along with moderate amount of stool in colon.  It showed possibility of stercoral colitis.     At the evaluation, patient was brought up in bed.  Alert, awake, acknowledge my presence, only able to tell her name.  Tries to verbalize some words but only ends up saying 'Rick'.  Able to follow motor commands however.      Assessment/Plan:  Principal Problem:   Acute metabolic encephalopathy Active Problems:   Altered mental status   Acute metabolic encephalopathy -Primarily presented with altered mental status, decreased alertness for more than 24 hours.   - Patient had similar presentation last month and was noted to have intracranial hemorrhage.   Per family, patient had similar symptoms with UTI as well.   Urinalysis consistent with UTI at this time. CT head this time did not show any new finding and showed resolution of the previously noted ICH. Her most remarkable neurological symptoms at this time is expressive aphasia.   -She had MRI brain and MRA head and neck done 5 weeks ago only.   -It appears that her his expressive aphasia is getting worse-discussed the case with on-call neurology Dr. Quinn Axe and Dr. Janeann Forehand who recommend repeat  CT of the head without contrast and MRI and transferred to [facility] for further evaluation (Ruling out recurrent stroke versus seizures)  -Continuing aspirin and statins    C. difficile infection -Much improved diarrhea, -Afebrile, normotensive no leukocytosis -Formed stool reported by nursing staff  On presentation in ED Patient was noted to have profuse diarrhea. Stool assay with C. difficile antigen Positive - Unable to start vancomycin because of allergy (hives).  Started on Dificid.      Type 2 diabetes mellitus A1c 5.6 PTA on metformin 500 mg twice daily, Keep it on hold.  Start on sliding scale insulin with Accu-Cheks.   Chronic diastolic CHF / Hypertension Echo 08/30/2021 showed EF more than 75%, G1DD, hyperdynamic right  ventricle.  Clinically patient has significant bilateral lower extremity edema.  Patient is unable to clarify the chronicity of it.  Not on diuretics. PTA on verapamil 60 mg 3 times daily, hydralazine 50 mg 3 times daily, lisinopril 50 mg daily. Resume all.   Continue monitor   HLD  -Continue Lipitor   History of PE Not on anticoagulation since ICH last admission   CKD 3b Creatinine remains at baseline Lab Results  Component Value Date   CREATININE 1.02 (H) 10/08/2021   CREATININE 1.10 (H) 10/07/2021   CREATININE 1.43 (H) 10/06/2021    Chronic anemia Stable     Latest Ref Rng & Units 10/08/2021    5:58 AM 10/07/2021    4:21 AM 10/06/2021   12:12 PM  CBC  WBC 4.0 - 10.5 K/uL 8.7  9.4  10.0   Hemoglobin 12.0 - 15.0 g/dL 10.2  9.6  9.5   Hematocrit 36.0 - 46.0 % 34.8  31.7  32.2   Platelets 150 - 400 K/uL 302  259  300      Adrenal adenoma There is 2.6 cm right adrenal nodule which measured 1.8 cm in the study done on 09/24/2001, most likely adenoma.    Mobility -PT eval once diarrhea is controlled   Goals of care -  Code Status: Prior unable to clarify CODE STATUS.  In the most recent hospitalization, patient was full code.  Prior to that she was in a DNR status.  Ordered full code for now.  Nutritional status:  The patient's BMI is: Body mass index is 35.12 kg/m. I agree with the assessment and plan as outlined -      Skin Assessment: I have examined the patient's skin and I agree with the wound assessment as performed by wound care team As outlined belowe: Pressure Injury 08/25/20 Buttocks Left;Medial Stage 2 -  Partial thickness loss of dermis presenting as a shallow open injury with a red, pink wound bed without slough. (Active)  08/25/20 2100  Location: Buttocks  Location Orientation: Left;Medial  Staging: Stage 2 -  Partial thickness loss of dermis presenting as a shallow open injury with a red, pink wound bed without slough.  Wound Description  (Comments):   Present on Admission:   Dressing Type Foam - Lift dressing to assess site every shift 10/07/21 2036     Pressure Injury 08/25/20 Coccyx Mid Stage 2 -  Partial thickness loss of dermis presenting as a shallow open injury with a red, pink wound bed without slough. (Active)  08/25/20 2100  Location: Coccyx  Location Orientation: Mid  Staging: Stage 2 -  Partial thickness loss of dermis presenting as a shallow open injury with a red, pink wound bed without slough.  Wound Description (Comments):   Present  on Admission: Yes     Pressure Injury 10/07/21 Thigh Distal;Posterior;Right Stage 2 -  Partial thickness loss of dermis presenting as a shallow open injury with a red, pink wound bed without slough. 3.5cm x 1cm (Active)  10/07/21 0223  Location: Thigh  Location Orientation: Distal;Posterior;Right  Staging: Stage 2 -  Partial thickness loss of dermis presenting as a shallow open injury with a red, pink wound bed without slough.  Wound Description (Comments): 3.5cm x 1cm  Present on Admission: Yes  Dressing Type Foam - Lift dressing to assess site every shift 10/07/21 2036     Pressure Injury 10/07/21 Thigh Posterior;Right Stage 2 -  Partial thickness loss of dermis presenting as a shallow open injury with a red, pink wound bed without slough. (Active)  10/07/21 0226  Location: Thigh  Location Orientation: Posterior;Right  Staging: Stage 2 -  Partial thickness loss of dermis presenting as a shallow open injury with a red, pink wound bed without slough.  Wound Description (Comments):   Present on Admission: Yes  Dressing Type Foam - Lift dressing to assess site every shift 10/07/21 2036    ---------------------------------------------------------------------------------------------------------------------------------------  DVT prophylaxis:  enoxaparin (LOVENOX) injection 40 mg Start: 10/07/21 1000   Code Status:   Code Status: DNR  Family Communication: Mr. Park Meo  son, POA updated by phone The above findings and plan of care has been discussed with patient (and family)  in detail,  they expressed understanding and agreement of above. -Advance care planning has been discussed.   Admission status:   Status is: Inpatient Remains inpatient appropriate because: Continue evaluation by speech, neuro, and treatment for C. difficile colitis, dehydration  Disposition: From SNF facility Transferring to [facility] for further neurological evaluation by neurology and imaging   Procedures:   No admission procedures for hospital encounter.   Antimicrobials:  Anti-infectives (From admission, onward)    Start     Dose/Rate Route Frequency Ordered Stop   10/08/21 0815  cefTRIAXone (ROCEPHIN) 1 g in sodium chloride 0.9 % 100 mL IVPB        1 g 200 mL/hr over 30 Minutes Intravenous Every 24 hours 10/08/21 0728     10/07/21 0000  fidaxomicin (DIFICID) 200 MG TABS tablet        200 mg Oral 2 times daily 10/07/21 1209 10/17/21 2359   10/06/21 2200  fidaxomicin (DIFICID) tablet 200 mg        200 mg Oral 2 times daily 10/06/21 1943 10/16/21 2159   10/06/21 1515  cefTRIAXone (ROCEPHIN) 1 g in sodium chloride 0.9 % 100 mL IVPB        1 g 200 mL/hr over 30 Minutes Intravenous  Once 10/06/21 1509 10/06/21 1652        Medication:   aspirin EC  81 mg Oral Daily   Chlorhexidine Gluconate Cloth  6 each Topical Daily   cholecalciferol  1,000 Units Oral Daily   enoxaparin (LOVENOX) injection  40 mg Subcutaneous Q24H   fidaxomicin  200 mg Oral BID   gabapentin  100 mg Oral TID   hydrALAZINE  50 mg Oral Q8H   insulin aspart  0-5 Units Subcutaneous QHS   insulin aspart  0-9 Units Subcutaneous TID WC   lactobacillus  1 g Oral TID WC   metoprolol tartrate  25 mg Oral BID   verapamil  60 mg Oral Q8H    acetaminophen **OR** acetaminophen, albuterol, hydrALAZINE, mouth rinse   Objective:   Vitals:   10/07/21 1908 10/07/21  2128 10/07/21 2201 10/08/21 0508  BP: 118/62  138/63 (!) 140/64 120/60  Pulse: (!) 109 (!) 103 98 95  Resp: '20 15 19 19  '$ Temp: 99 F (37.2 C) 97.6 F (36.4 C) 98 F (36.7 C) 97.8 F (36.6 C)  TempSrc: Oral   Oral  SpO2: 99% 100% 97% 100%  Weight:      Height:        Intake/Output Summary (Last 24 hours) at 10/08/2021 1016 Last data filed at 10/08/2021 0458 Gross per 24 hour  Intake 2047.37 ml  Output 1375 ml  Net 672.37 ml   Filed Weights   10/06/21 2240 10/07/21 0500  Weight: 98.7 kg 98.7 kg     Examination:      Physical Exam:   General:  Awake alert, worsening aphasia unable to state her name or state where she is cannot name the object's around her.Marland Kitchen  HEENT:  Normocephalic, PERRL, otherwise with in Normal limits   Neuro:  Worsening expressive aphasia-CNII-XII intact. , normal motor and sensation, reflexes intact   Lungs:   Clear to auscultation BL, Respirations unlabored,  No wheezes / crackles  Cardio:    S1/S2, RRR, No murmure, No Rubs or Gallops   Abdomen:  Soft, non-tender, bowel sounds active all four quadrants, no guarding or peritoneal signs.  Muscular  skeletal:  Limited exam -severe global generalized weaknesses - in bed, able to move all 4 extremities,   2+ pulses,  symmetric, No pitting edema  Skin:  Dry, warm to touch, negative for any Rashes,  Wounds: Please see nursing documentation  Pressure Injury 08/25/20 Buttocks Left;Medial Stage 2 -  Partial thickness loss of dermis presenting as a shallow open injury with a red, pink wound bed without slough. (Active)  08/25/20 2100  Location: Buttocks  Location Orientation: Left;Medial  Staging: Stage 2 -  Partial thickness loss of dermis presenting as a shallow open injury with a red, pink wound bed without slough.  Wound Description (Comments):   Present on Admission:   Dressing Type Foam - Lift dressing to assess site every shift 10/07/21 2036     Pressure Injury 08/25/20 Coccyx Mid Stage 2 -  Partial thickness loss of dermis presenting as a  shallow open injury with a red, pink wound bed without slough. (Active)  08/25/20 2100  Location: Coccyx  Location Orientation: Mid  Staging: Stage 2 -  Partial thickness loss of dermis presenting as a shallow open injury with a red, pink wound bed without slough.  Wound Description (Comments):   Present on Admission: Yes     Pressure Injury 10/07/21 Thigh Distal;Posterior;Right Stage 2 -  Partial thickness loss of dermis presenting as a shallow open injury with a red, pink wound bed without slough. 3.5cm x 1cm (Active)  10/07/21 0223  Location: Thigh  Location Orientation: Distal;Posterior;Right  Staging: Stage 2 -  Partial thickness loss of dermis presenting as a shallow open injury with a red, pink wound bed without slough.  Wound Description (Comments): 3.5cm x 1cm  Present on Admission: Yes  Dressing Type Foam - Lift dressing to assess site every shift 10/07/21 2036     Pressure Injury 10/07/21 Thigh Posterior;Right Stage 2 -  Partial thickness loss of dermis presenting as a shallow open injury with a red, pink wound bed without slough. (Active)  10/07/21 0226  Location: Thigh  Location Orientation: Posterior;Right  Staging: Stage 2 -  Partial thickness loss of dermis presenting as a shallow open injury with a red, pink  wound bed without slough.  Wound Description (Comments):   Present on Admission: Yes  Dressing Type Foam - Lift dressing to assess site every shift 10/07/21 2036          ------------------------------------------------------------------------------------------------------------------------------------------    LABs:     Latest Ref Rng & Units 10/08/2021    5:58 AM 10/07/2021    4:21 AM 10/06/2021   12:12 PM  CBC  WBC 4.0 - 10.5 K/uL 8.7  9.4  10.0   Hemoglobin 12.0 - 15.0 g/dL 10.2  9.6  9.5   Hematocrit 36.0 - 46.0 % 34.8  31.7  32.2   Platelets 150 - 400 K/uL 302  259  300       Latest Ref Rng & Units 10/08/2021    5:58 AM 10/07/2021    4:21 AM  10/06/2021    2:25 PM  CMP  Glucose 70 - 99 mg/dL 150  137    BUN 8 - 23 mg/dL 17  26    Creatinine 0.44 - 1.00 mg/dL 1.02  1.10    Sodium 135 - 145 mmol/L 144  144    Potassium 3.5 - 5.1 mmol/L 4.1  4.2  5.1   Chloride 98 - 111 mmol/L 112  111    CO2 22 - 32 mmol/L 25  24    Calcium 8.9 - 10.3 mg/dL 8.2  8.4         Micro Results Recent Results (from the past 240 hour(s))  SARS Coronavirus 2 by RT PCR (hospital order, performed in [place] hospital lab) *cepheid single result test* Anterior Nasal Swab     Status: None   Collection Time: 10/06/21 12:12 PM   Specimen: Anterior Nasal Swab  Result Value Ref Range Status   SARS Coronavirus 2 by RT PCR NEGATIVE NEGATIVE Final    Comment: (NOTE) SARS-CoV-2 target nucleic acids are NOT DETECTED.  The SARS-CoV-2 RNA is generally detectable in upper and lower respiratory specimens during the acute phase of infection. The lowest concentration of SARS-CoV-2 viral copies this assay can detect is 250 copies / mL. A negative result does not preclude SARS-CoV-2 infection and should not be used as the sole basis for treatment or other patient management decisions.  A negative result may occur with improper specimen collection / handling, submission of specimen other than nasopharyngeal swab, presence of viral mutation(s) within the areas targeted by this assay, and inadequate number of viral copies (<250 copies / mL). A negative result must be combined with clinical observations, patient history, and epidemiological information.  Fact Sheet for Patients:   https://www.patel.info/  Fact Sheet for Healthcare Providers: https://hall.com/  This test is not yet approved or  cleared by the [place] FDA and has been authorized for detection and/or diagnosis of SARS-CoV-2 by FDA under an Emergency Use Authorization (EUA).  This EUA will remain in effect (meaning this test can be used) for the  duration of the COVID-19 declaration under Section 564(b)(1) of the Act, 21 U.S.C. section 360bbb-3(b)(1), unless the authorization is terminated or revoked sooner.  Performed at [facility], [address], [place], [place] [address]   C Difficile Quick Screen w PCR reflex     Status: Abnormal   Collection Time: 10/06/21  4:51 PM   Specimen: STOOL  Result Value Ref Range Status   C Diff antigen POSITIVE (A) NEGATIVE Final   C Diff toxin NEGATIVE NEGATIVE Final   C Diff interpretation Results are indeterminate. See PCR results.  Final  Comment: Performed at [facility], [address] [place], [place] [address]  C. Diff by PCR, Reflexed     Status: Abnormal   Collection Time: 10/06/21  4:51 PM  Result Value Ref Range Status   Toxigenic C. Difficile by PCR POSITIVE (A) NEGATIVE Final    Comment: Positive for toxigenic C. difficile with little to no toxin production. Only treat if clinical presentation suggests symptomatic illness. Performed at [facility], [place] [address], [place], [place] [address]   MRSA Next Gen by PCR, Nasal     Status: None   Collection Time: 10/06/21 10:31 PM   Specimen: Nasal Mucosa; Nasal Swab  Result Value Ref Range Status   MRSA by PCR Next Gen NOT DETECTED NOT DETECTED Final    Comment: (NOTE) The GeneXpert MRSA Assay (FDA approved for NASAL specimens only), is one component of a comprehensive MRSA colonization surveillance program. It is not intended to diagnose MRSA infection nor to guide or monitor treatment for MRSA infections. Test performance is not FDA approved in patients less than 66 years old. Performed at [facility], [address], [place], [place] [address]     Radiology Reports No results found.  SIGNED: Deatra James, MD, FHM. [facility],  Pager (please use [email or web address] to page/text) Please use Epic Secure Chat for non-urgent communication (7AM-7PM)  If 7PM-7AM, please contact  night-coverage [email or web address], 10/08/2021, 10:16 AM

## 2021-10-09 ENCOUNTER — Inpatient Hospital Stay (HOSPITAL_COMMUNITY): Payer: Medicare (Managed Care)

## 2021-10-09 DIAGNOSIS — G9341 Metabolic encephalopathy: Secondary | ICD-10-CM | POA: Diagnosis not present

## 2021-10-09 LAB — URINE CULTURE: Culture: <10000 — AB

## 2021-10-09 LAB — BASIC METABOLIC PANEL
Anion gap: 7 (ref 5–15)
BUN: 15 mg/dL (ref 8–23)
CO2: 24 mmol/L (ref 22–32)
Calcium: 7.7 mg/dL — ABNORMAL LOW (ref 8.9–10.3)
Chloride: 112 mmol/L — ABNORMAL HIGH (ref 98–111)
Creatinine, Ser: 1.06 mg/dL — ABNORMAL HIGH (ref 0.44–1.00)
GFR, Estimated: 56 mL/min — ABNORMAL LOW (ref >60)
Glucose, Bld: 133 mg/dL — ABNORMAL HIGH (ref 70–99)
Potassium: 4.6 mmol/L (ref 3.5–5.1)
Sodium: 143 mmol/L (ref 135–145)

## 2021-10-09 LAB — CBC
HCT: 30.8 % — ABNORMAL LOW (ref 36.0–46.0)
Hemoglobin: 9.1 g/dL — ABNORMAL LOW (ref 12.0–15.0)
MCH: 26.5 pg (ref 26.0–34.0)
MCHC: 29.5 g/dL — ABNORMAL LOW (ref 30.0–36.0)
MCV: 89.5 fL (ref 80.0–100.0)
Platelets: 263 10*3/uL (ref 150–400)
RBC: 3.44 MIL/uL — ABNORMAL LOW (ref 3.87–5.11)
RDW: 17.3 % — ABNORMAL HIGH (ref 11.5–15.5)
WBC: 7.4 10*3/uL (ref 4.0–10.5)
nRBC: 0 % (ref 0.0–0.2)

## 2021-10-09 LAB — GLUCOSE, CAPILLARY
Glucose-Capillary: 137 mg/dL — ABNORMAL HIGH (ref 70–99)
Glucose-Capillary: 234 mg/dL — ABNORMAL HIGH (ref 70–99)
Glucose-Capillary: 138 mg/dL — ABNORMAL HIGH (ref 70–99)
Glucose-Capillary: 221 mg/dL — ABNORMAL HIGH (ref 70–99)

## 2021-10-09 NOTE — Progress Notes (Signed)
Pt had 5 beat run of vtach at 0523.  This nurse had entered room shortly after, heard monitor alarming while in hall.  Pt sleeping, NAD.  Previous VSS.  Pt currently in NSR on monitor, HR in 70s where pt has been since monitoring.  Reviewed with provider, no new orders.  Will continue to monitor.

## 2021-10-09 NOTE — Progress Notes (Signed)
PROGRESS NOTE    Patient: [name]                            PCP: Caprice Renshaw, MD                    DOB: 14-Apr-1950            DOA: 10/06/2021 [id number]             DOS: 10/09/2021, 10:51 AM   LOS: 2 days   Date of Service: The patient was seen and examined on 10/09/2021  Subjective:   The patient was seen and examined this morning. Hemodynamically stable, awake alert, able to state her name, but unable to name objects or the place or time.... Able to feed herself No issues overnight  Brief Narrative:   Pragya L Snipe is a 71 y.o. female with PMH significant for DM2, HTN, history of PE.Marland KitchenMarland KitchenPatient was sent to the ED complaint of altered mentation. Per report, at the nursing facility, patient has not been acting herself, speaking or moving extremities less than normal for more than 24 hours.   Per son, typically patient is chatty. Patient was last hospitalized 7/10 to 7/18 for similar symptoms.  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  Eliquis was stopped.  Her mental status gradually improved and she was discharged back to facility.  Per family, patient had similar symptoms in the past as well when she had UTI.   ED course : The patient had a low-grade temperature of 90.5, heart rate over 110, blood pressure elevated to 171/81, breathing on room air. Labs with potassium elevated to 5.5, glucose 167, BUN/creatinine 33/1.43, hemoglobin 9.5 Urinalysis with hazy yellow urine with moderate amount of leukocytes, rare bacteria   While in the ED, patient was noted to have profuse diarrhea. C. difficile antigen positive, CT head showed resolution of previous right hippocampal hemorrhage. CT abdomen pelvis did not show any evidence of interested in obstruction or pneumoperitoneum. - There is mild diffuse wall thickening in rectum along with moderate amount of stool in colon.  It showed possibility of stercoral colitis.     At the time of my evaluation, patient  was brought up in bed.  Alert, awake, acknowledge my presence, only able to tell her name.  Tries to verbalize some words but only ends up saying '[place]'.  Able to follow motor commands however.  No family at bedside at the time of my evaluation  Assessment/Plan:  Principal Problem:   Acute metabolic encephalopathy Recurrent new CVA Active Problems: C. difficile colitis Altered mental status   Acute metabolic encephalopathy More awake -alert, pronounced depressive Primarily presented with altered mental status, decreased alertness for more than 24 hours.  Patient had similar presentation last month and was noted to have intracranial hemorrhage.  Per family, patient had similar symptoms with UTI as well.   Urinalysis consistent with UTI at this time. CT head this time did not show any new finding  Subsequent CT of the head on 10/08/2021 reported evolving stroke site left frontal area Her most remarkable neurological symptoms at this time is expressive aphasia.     Recurrent new CVA :  With main manifestation of aphasia--mild improvement today -Patient had a stroke approximately a month ago, was placed on aspirin, statins -As it was thought that with small hemorrhagic stroke patient's home medication of Eliquis was discontinued (initially was  prescribed for PE) -On 10/09/2018 3 AM on-call neurologist from [facility] Dr. Quinn Axe was notified, consulted Repeat CT of the head revealed:  Evolving acute to early subacute infarct in the left frontal operculum without associated hemorrhage or mass effect  -Dr. Quinn Axe evaluated the patient via telemetry neurology consult, discussed with Dr. Erlinda Hong -determined the patient is to be back on Eliquis Further stroke work-up needing MR I/MRA -Patient's son requested patient to be transferred to [facility]  Pending transfer  C. difficile infection -Improved diarrhea, discontinue rectal tube, afebrile normotensive no leukocytosis - Unable to start vancomycin  because of allergy (hives).   Started on Dificid.  C. difficile PCR ++  IV hydration with normal saline   Type 2 diabetes mellitus A1c 5.6 PTA on metformin 500 mg twice daily, Keep it on hold.  Start on sliding scale insulin with Accu-Cheks.   Chronic diastolic CHF  / Hypertension Echo 08/30/2021 showed EF more than 75%, G1DD, hyperdynamic right ventricle.  Clinically patient has significant bilateral lower extremity edema.  Patient is unable to clarify the chronicity of it.  Not on diuretics. PTA on verapamil 60 mg 3 times daily, hydralazine 50 mg 3 times daily, lisinopril 50 mg daily. Resume all.  Continue monitor   HLD - Continue Lipitor   History of PE - Resuming Eliquis (per neurology safe to restart)   CKD 3b - Creatinine remains at baseline Lab Results  Component Value Date   CREATININE 1.06 (H) 10/09/2021   CREATININE 1.02 (H) 10/08/2021   CREATININE 1.10 (H) 10/07/2021      Chronic anemia Continue iron supplement.  Continue to trend hemoglobin.    Latest Ref Rng & Units 10/09/2021    4:19 AM 10/08/2021    5:58 AM 10/07/2021    4:21 AM  CBC  WBC 4.0 - 10.5 K/uL 7.4  8.7  9.4   Hemoglobin 12.0 - 15.0 g/dL 9.1  10.2  9.6   Hematocrit 36.0 - 46.0 % 30.8  34.8  31.7   Platelets 150 - 400 K/uL 263  302  259     Adrenal adenoma There is 2.6 cm right adrenal nodule which measured 1.8 cm in the study done on 09/24/2001, most likely adenoma.    Mobility -PT eval once diarrhea is controlled   Goals of care - -  Code Status: Prior unable to clarify CODE STATUS.  In the most recent hospitalization, patient was full code.  Prior to that she was in a DNR status.  Ordered full code for now.    ----------------------------------------------------------------------------------------------------------------------------------------------- Nutritional status:  The patient's BMI is: Body mass index is 35.12 kg/m. I agree with the assessment and plan as outlined Skin  Assessment: I have examined the patient's skin and I agree with the wound assessment as performed by wound care team As outlined belowe: Pressure Injury 08/25/20 Buttocks Left;Medial Stage 2 -  Partial thickness loss of dermis presenting as a shallow open injury with a red, pink wound bed without slough. (Active)  08/25/20 2100  Location: Buttocks  Location Orientation: Left;Medial  Staging: Stage 2 -  Partial thickness loss of dermis presenting as a shallow open injury with a red, pink wound bed without slough.  Wound Description (Comments):   Present on Admission:   Dressing Type Foam - Lift dressing to assess site every shift 10/08/21 2027     Pressure Injury 08/25/20 Coccyx Mid Stage 2 -  Partial thickness loss of dermis presenting as a shallow open injury with a  red, pink wound bed without slough. (Active)  08/25/20 2100  Location: Coccyx  Location Orientation: Mid  Staging: Stage 2 -  Partial thickness loss of dermis presenting as a shallow open injury with a red, pink wound bed without slough.  Wound Description (Comments):   Present on Admission: Yes     Pressure Injury 10/07/21 Thigh Distal;Posterior;Right Stage 2 -  Partial thickness loss of dermis presenting as a shallow open injury with a red, pink wound bed without slough. 3.5cm x 1cm (Active)  10/07/21 0223  Location: Thigh  Location Orientation: Distal;Posterior;Right  Staging: Stage 2 -  Partial thickness loss of dermis presenting as a shallow open injury with a red, pink wound bed without slough.  Wound Description (Comments): 3.5cm x 1cm  Present on Admission: Yes  Dressing Type Foam - Lift dressing to assess site every shift 10/08/21 2027     Pressure Injury 10/07/21 Thigh Posterior;Right Stage 2 -  Partial thickness loss of dermis presenting as a shallow open injury with a red, pink wound bed without slough. (Active)  10/07/21 0226  Location: Thigh  Location Orientation: Posterior;Right  Staging: Stage 2 -   Partial thickness loss of dermis presenting as a shallow open injury with a red, pink wound bed without slough.  Wound Description (Comments):   Present on Admission: Yes  Dressing Type Foam - Lift dressing to assess site every shift 10/08/21 2027     ----------------------------------------------------------------------------------------------------------------------------------------------- Cultures; Urine Culture  >>> NGT   Stool positive for C. difficile  ------------------------------------------------------------------------------------------------------------------------------------------  DVT prophylaxis:   apixaban (ELIQUIS) tablet 5 mg   Code Status:   Code Status: DNR  Family Communication: Discussed with her son at [place] wants her to be transferred to [facility] The above findings and plan of care has been discussed with patient (and family)  in detail,  they expressed understanding and agreement of above. -Advance care planning has been discussed.   Admission status:   Status is: Inpatient Remains inpatient appropriate because: Needing complete recurrent stroke work-up Head imaging pending MRI I/MRA     Procedures:   No admission procedures for hospital encounter.   Antimicrobials:  Anti-infectives (From admission, onward)    Start     Dose/Rate Route Frequency Ordered Stop   10/08/21 0815  cefTRIAXone (ROCEPHIN) 1 g in sodium chloride 0.9 % 100 mL IVPB        1 g 200 mL/hr over 30 Minutes Intravenous Every 24 hours 10/08/21 0728     10/07/21 0000  fidaxomicin (DIFICID) 200 MG TABS tablet        200 mg Oral 2 times daily 10/07/21 1209 10/17/21 2359   10/06/21 2200  fidaxomicin (DIFICID) tablet 200 mg        200 mg Oral 2 times daily 10/06/21 1943 10/16/21 2159   10/06/21 1515  cefTRIAXone (ROCEPHIN) 1 g in sodium chloride 0.9 % 100 mL IVPB        1 g 200 mL/hr over 30 Minutes Intravenous  Once 10/06/21 1509 10/06/21 1652        Medication:    apixaban  5 mg Oral BID   Chlorhexidine Gluconate Cloth  6 each Topical Daily   cholecalciferol  1,000 Units Oral Daily   fidaxomicin  200 mg Oral BID   gabapentin  100 mg Oral TID   insulin aspart  0-5 Units Subcutaneous QHS   insulin aspart  0-9 Units Subcutaneous TID WC   lactobacillus  1 g Oral TID WC   metoprolol tartrate  12.5 mg Oral BID    acetaminophen **OR** acetaminophen, albuterol, hydrALAZINE, mouth rinse   Objective:   Vitals:   10/08/21 2119 10/09/21 0102 10/09/21 0422 10/09/21 0834  BP: (!) 155/69  134/78 (!) 129/91  Pulse: (!) 108 74 87 87  Resp: 19  16   Temp: 98.7 F (37.1 C)  98.4 F (36.9 C)   TempSrc: Oral  Oral   SpO2: 100%  99%   Weight:      Height:        Intake/Output Summary (Last 24 hours) at 10/09/2021 1051 Last data filed at 10/09/2021 1044 Gross per 24 hour  Intake 2561.69 ml  Output 2550 ml  Net 11.69 ml   Filed Weights   10/06/21 2240 10/07/21 0500  Weight: 98.7 kg 98.7 kg     Examination:   Physical Exam  Constitution: Awake alert, significant expressive aphasia, but able to state her name today When pointing to objects unable to name them but she nods that she knows what today's Psychiatric:   Normal and stable mood and affect, cognition intact,   HEENT:        Normocephalic, PERRL, otherwise with in Normal limits  Chest:         Chest symmetric Cardio vascular:  S1/S2, RRR, No murmure, No Rubs or Gallops  pulmonary: Clear to auscultation bilaterally, respirations unlabored, negative wheezes / crackles Abdomen: Soft, non-tender, non-distended, bowel sounds,no masses, no organomegaly Muscular skeletal: Limited exam - in bed, able to move all 4 extremities,   Neuro: Significant expressive aphasia, CNII-XII intact. ,  Significant symmetric generalized weaknesses, intact sensation, reflexes intact  Extremities: No pitting edema lower extremities, +2 pulses  Skin: Dry, warm to touch, negative for any Rashes, No open wounds Wounds:  per nursing documentation   ------------------------------------------------------------------------------------------------------------------------------------------    LABs:     Latest Ref Rng & Units 10/09/2021    4:19 AM 10/08/2021    5:58 AM 10/07/2021    4:21 AM  CBC  WBC 4.0 - 10.5 K/uL 7.4  8.7  9.4   Hemoglobin 12.0 - 15.0 g/dL 9.1  10.2  9.6   Hematocrit 36.0 - 46.0 % 30.8  34.8  31.7   Platelets 150 - 400 K/uL 263  302  259       Latest Ref Rng & Units 10/09/2021    4:19 AM 10/08/2021    5:58 AM 10/07/2021    4:21 AM  CMP  Glucose 70 - 99 mg/dL 133  150  137   BUN 8 - 23 mg/dL '15  17  26   '$ Creatinine 0.44 - 1.00 mg/dL 1.06  1.02  1.10   Sodium 135 - 145 mmol/L 143  144  144   Potassium 3.5 - 5.1 mmol/L 4.6  4.1  4.2   Chloride 98 - 111 mmol/L 112  112  111   CO2 22 - 32 mmol/L '24  25  24   '$ Calcium 8.9 - 10.3 mg/dL 7.7  8.2  8.4        Micro Results Recent Results (from the past 240 hour(s))  SARS Coronavirus 2 by RT PCR (hospital order, performed in [place] hospital lab) *cepheid single result test* Anterior Nasal Swab     Status: None   Collection Time: 10/06/21 12:12 PM   Specimen: Anterior Nasal Swab  Result Value Ref Range Status   SARS Coronavirus 2 by RT PCR NEGATIVE NEGATIVE Final    Comment: (NOTE) SARS-CoV-2 target nucleic acids are NOT DETECTED.  The SARS-CoV-2 RNA is generally detectable in upper and lower respiratory specimens during the acute phase of infection. The lowest concentration of SARS-CoV-2 viral copies this assay can detect is 250 copies / mL. A negative result does not preclude SARS-CoV-2 infection and should not be used as the sole basis for treatment or other patient management decisions.  A negative result may occur with improper specimen collection / handling, submission of specimen other than nasopharyngeal swab, presence of viral mutation(s) within the areas targeted by this assay, and inadequate number of viral  copies (<250 copies / mL). A negative result must be combined with clinical observations, patient history, and epidemiological information.  Fact Sheet for Patients:   https://www.patel.info/  Fact Sheet for Healthcare Providers: https://hall.com/  This test is not yet approved or  cleared by the [place] FDA and has been authorized for detection and/or diagnosis of SARS-CoV-2 by FDA under an Emergency Use Authorization (EUA).  This EUA will remain in effect (meaning this test can be used) for the duration of the COVID-19 declaration under Section 564(b)(1) of the Act, 21 U.S.C. section 360bbb-3(b)(1), unless the authorization is terminated or revoked sooner.  Performed at [facility], [address], [place], [place] [address]   C Difficile Quick Screen w PCR reflex     Status: Abnormal   Collection Time: 10/06/21  4:51 PM   Specimen: STOOL  Result Value Ref Range Status   C Diff antigen POSITIVE (A) NEGATIVE Final   C Diff toxin NEGATIVE NEGATIVE Final   C Diff interpretation Results are indeterminate. See PCR results.  Final    Comment: Performed at [facility], [address] [place], [place] [address]  C. Diff by PCR, Reflexed     Status: Abnormal   Collection Time: 10/06/21  4:51 PM  Result Value Ref Range Status   Toxigenic C. Difficile by PCR POSITIVE (A) NEGATIVE Final    Comment: Positive for toxigenic C. difficile with little to no toxin production. Only treat if clinical presentation suggests symptomatic illness. Performed at [facility], [place] [address] [place], [place] [address]   Urine Culture     Status: Abnormal   Collection Time: 10/06/21  5:55 PM   Specimen: Urine, Catheterized  Result Value Ref Range Status   Specimen Description   Final    URINE, CATHETERIZED Performed at [facility], [address], [place], [place] [address]    Special Requests   Final    NONE Performed at [facility],  [address], [place], [place] [address]    Culture (A)  Final    <10,000 COLONIES/mL INSIGNIFICANT GROWTH Performed at [place] [address], [place], [place] [address]    Report Status 10/09/2021 FINAL  Final  MRSA Next Gen by PCR, Nasal     Status: None   Collection Time: 10/06/21 10:31 PM   Specimen: Nasal Mucosa; Nasal Swab  Result Value Ref Range Status   MRSA by PCR Next Gen NOT DETECTED NOT DETECTED Final    Comment: (NOTE) The GeneXpert MRSA Assay (FDA approved for NASAL specimens only), is one component of a comprehensive MRSA colonization surveillance program. It is not intended to diagnose MRSA infection nor to guide or monitor treatment for MRSA infections. Test performance is not FDA approved in patients less than 71 years old. Performed at [facility], [address], [place], [place] [address]     Radiology Reports CT HEAD WO CONTRAST (5MM)  Result Date: 10/08/2021  CLINICAL DATA:  Altered mental status with decreased alertness for more than 24 hours. EXAM: CT HEAD WITHOUT CONTRAST TECHNIQUE: Contiguous axial images were obtained from the base of the skull through the vertex without intravenous contrast. RADIATION DOSE REDUCTION: This exam was performed according to the departmental dose-optimization program which includes automated exposure control, adjustment of the mA and/or kV according to patient size and/or use of iterative reconstruction technique. COMPARISON:  CT head 2 days prior FINDINGS: Brain: There is hypodensity with loss of gray-white differentiation in the left frontal operculum concerning for evolving acute to early subacute infarct, progressed since the study from 2 days prior. There is no associated hemorrhage. There is no acute intracranial hemorrhage or extra-axial fluid collection. The ventricles are stable in size. A remote infarct in the left corona radiata with mild associated ex vacuo dilatation of the left lateral ventricle is unchanged. There  is developing encephalomalacia in the right hippocampus related to the previously seen hemorrhage. Additional confluent hypodensity in the supratentorial white matter likely reflecting sequela of chronic white matter microangiopathy is unchanged. There is no mass lesion.  There is no mass effect or midline shift. Vascular: There is calcification of the bilateral cavernous ICAs. Skull: Choose Sinuses/Orbits: The imaged paranasal sinuses are clear. The imaged globes and orbits are unremarkable. Other: None. IMPRESSION: Evolving acute to early subacute infarct in the left frontal operculum without associated hemorrhage or mass effect, increased in conspicuity since the study from 2 days prior. These results will be called to the ordering clinician or representative by the Radiologist Assistant, and communication documented in the PACS or [employer]. Electronically Signed   By: Valetta Mole M.D.   On: 10/08/2021 12:16    SIGNED: Deatra James, MD, FHM. [facility],  Pager (please use [email or web address] to page/text) Please use Epic Secure Chat for non-urgent communication (7AM-7PM)  If 7PM-7AM, please contact night-coverage [email or web address], 10/09/2021, 10:51 AM

## 2021-10-10 ENCOUNTER — Inpatient Hospital Stay (HOSPITAL_COMMUNITY): Payer: Medicare (Managed Care)

## 2021-10-10 DIAGNOSIS — I639 Cerebral infarction, unspecified: Secondary | ICD-10-CM

## 2021-10-10 DIAGNOSIS — G9341 Metabolic encephalopathy: Secondary | ICD-10-CM | POA: Diagnosis not present

## 2021-10-10 DIAGNOSIS — I82409 Acute embolism and thrombosis of unspecified deep veins of unspecified lower extremity: Secondary | ICD-10-CM | POA: Clinically undetermined

## 2021-10-10 LAB — GLUCOSE, CAPILLARY
Glucose-Capillary: 141 mg/dL — ABNORMAL HIGH (ref 70–99)
Glucose-Capillary: 123 mg/dL — ABNORMAL HIGH (ref 70–99)

## 2021-10-10 LAB — BASIC METABOLIC PANEL
Anion gap: 5 (ref 5–15)
BUN: 16 mg/dL (ref 8–23)
CO2: 23 mmol/L (ref 22–32)
Calcium: 7.2 mg/dL — ABNORMAL LOW (ref 8.9–10.3)
Chloride: 115 mmol/L — ABNORMAL HIGH (ref 98–111)
Creatinine, Ser: 0.97 mg/dL (ref 0.44–1.00)
GFR, Estimated: >60 mL/min (ref >60)
Glucose, Bld: 126 mg/dL — ABNORMAL HIGH (ref 70–99)
Potassium: 3.4 mmol/L — ABNORMAL LOW (ref 3.5–5.1)
Sodium: 143 mmol/L (ref 135–145)

## 2021-10-10 LAB — MAGNESIUM: Magnesium: 1.3 mg/dL — ABNORMAL LOW (ref 1.7–2.4)

## 2021-10-10 LAB — CBC
HCT: 30.1 % — ABNORMAL LOW (ref 36.0–46.0)
Hemoglobin: 8.5 g/dL — ABNORMAL LOW (ref 12.0–15.0)
MCH: 26 pg (ref 26.0–34.0)
MCHC: 28.2 g/dL — ABNORMAL LOW (ref 30.0–36.0)
MCV: 92 fL (ref 80.0–100.0)
Platelets: 262 10*3/uL (ref 150–400)
RBC: 3.27 MIL/uL — ABNORMAL LOW (ref 3.87–5.11)
RDW: 17.1 % — ABNORMAL HIGH (ref 11.5–15.5)
WBC: 8 10*3/uL (ref 4.0–10.5)
nRBC: 0 % (ref 0.0–0.2)

## 2021-10-10 MED ORDER — CALCIUM GLUCONATE-NACL 1-0.675 GM/50ML-% IV SOLN
1.0000 g | Freq: Once | INTRAVENOUS | Status: AC
  Administered 2021-10-10: 1000 mg via INTRAVENOUS
  Filled 2021-10-10: qty 50

## 2021-10-10 MED ORDER — APIXABAN 5 MG PO TABS: 5.0000 mg | ORAL_TABLET | Freq: Two times a day (BID) | ORAL | 1 refills | Status: AC

## 2021-10-10 MED ORDER — FIDAXOMICIN 200 MG PO TABS: 200.0000 mg | ORAL_TABLET | Freq: Two times a day (BID) | ORAL | 0 refills | Status: AC

## 2021-10-10 MED ORDER — METOPROLOL TARTRATE 25 MG PO TABS: 25.0000 mg | ORAL_TABLET | Freq: Two times a day (BID) | ORAL | 0 refills | Status: AC

## 2021-10-10 NOTE — TOC Transition Note (Signed)
Transition of Care [facility]) - CM/SW Discharge Note   Patient Details  Name: [name] MRN: [id number] Date of Birth: 1950/08/24  [facility]) CM/SW Contact:  Shade Flood, LCSW Phone Number: 10/10/2021, 1:09 PM   Clinical Narrative:     Per MD, pt stable for dc back to [facility] today. Updated pt's son who is in agreement. Updated Debbie at [facility] DC clinical sent electronically. RN to call report. EMS arranged.  No other TOC needs for dc.  Final next level of care: Long Term Nursing Home Barriers to Discharge: Barriers Resolved   Patient Goals and CMS Choice Patient states their goals for this hospitalization and ongoing recovery are:: return to LTC facility CMS Medicare.gov Compare Post Acute Care list provided to:: Patient Choice offered to / list presented to : Patient  Discharge Placement                       Discharge Plan and Services In-house Referral: Clinical Social Work Discharge Planning Services: CM Consult Post Acute Care Choice: Nursing Home                               Social Determinants of Health (SDOH) Interventions     Readmission Risk Interventions    10/07/2021   11:49 AM  Readmission Risk Prevention Plan  Transportation Screening Complete  HRI or Home Care Consult Complete  Social Work Consult for [place] Planning/Counseling Complete  Palliative Care Screening Not Applicable  Medication Review [employer]) Complete

## 2021-10-10 NOTE — Progress Notes (Deleted)
Notified MD of patient family wanting patient to be transferred to neuro floor at [facility] hospital.  Notified both mid level and night time hospitalist.  Received no order for transfer at this time, will defer to daytime hospitalist.

## 2021-10-10 NOTE — Progress Notes (Signed)
Nsg Discharge Note  Admit Date:  10/06/2021 Discharge date: 10/10/2021   [name] to be D/C'd Nursing Home per MD order.  AVS completed.  Copy for chart, and copy for patient signed, and dated. Patient/caregiver able to verbalize understanding. Discharge papers placed in discharge packet for [facility], Report called. Patient transferred via EMS Discharge Medication: Allergies as of 10/10/2021       Reactions   Vancomycin Hives, Rash   "big red bumps, then they turned white, then I shedded like a snake."         Medication List     STOP taking these medications    feeding supplement (GLUCERNA SHAKE) Liqd   hydrALAZINE 50 MG tablet Commonly known as: APRESOLINE   pantoprazole 40 MG tablet Commonly known as: PROTONIX   pantoprazole sodium 40 mg Commonly known as: PROTONIX   senna 8.6 MG Tabs tablet Commonly known as: SENOKOT       TAKE these medications    acetaminophen 650 MG CR tablet Commonly known as: TYLENOL Take 1,300 mg by mouth every 8 (eight) hours as needed for pain.   ammonium lactate 12 % lotion Commonly known as: LAC-HYDRIN Apply topically 2 (two) times daily.   apixaban 5 MG Tabs tablet Commonly known as: ELIQUIS Take 1 tablet (5 mg total) by mouth 2 (two) times daily.   ascorbic acid 500 MG tablet Commonly known as: VITAMIN C Take 500 mg by mouth daily.   atorvastatin 40 MG tablet Commonly known as: LIPITOR Take 40 mg by mouth daily.   cholecalciferol 25 MCG (1000 UNIT) tablet Commonly known as: VITAMIN D3 Take 1,000 Units by mouth daily.   ferrous sulfate 300 (60 Fe) MG/5ML syrup Take 5 mLs (300 mg total) by mouth daily with breakfast.   fidaxomicin 200 MG Tabs tablet Commonly known as: DIFICID Take 1 tablet (200 mg total) by mouth 2 (two) times daily for 8 days.   fluticasone 50 MCG/ACT nasal spray Commonly known as: FLONASE Place 1 spray into both nostrils daily.   gabapentin 100 MG capsule Commonly known as:  NEURONTIN Take 100 mg by mouth 3 (three) times daily.   lactobacillus Pack Take 1 packet (1 g total) by mouth 3 (three) times daily with meals for 14 days.   lisinopril 30 MG tablet Commonly known as: ZESTRIL Take 15 mg by mouth daily.   metFORMIN 1000 MG tablet Commonly known as: GLUCOPHAGE Take 500 mg by mouth 2 (two) times a day.   metoprolol tartrate 25 MG tablet Commonly known as: LOPRESSOR Take 1 tablet (25 mg total) by mouth 2 (two) times daily.   mirtazapine 7.5 MG tablet Commonly known as: REMERON Take 1 tablet (7.5 mg total) by mouth at bedtime.   verapamil 120 MG tablet Commonly known as: CALAN Take 0.5 tablets (60 mg total) by mouth every 8 (eight) hours.               Discharge Care Instructions  (From admission, onward)           Start     Ordered   10/10/21 0000  Discharge wound care:       Comments: Continue dressing changes per wound care RN instructions Reposition patient every 2 hours...   10/10/21 1207   10/07/21 0000  Discharge wound care:       Comments: Wound care per RN discharge instructions   10/07/21 1209            Discharge Assessment: Vitals:  10/10/21 0347 10/10/21 0903  BP: (!) 141/73 134/83  Pulse: 85 79  Resp: 17   Temp: (!) 97 F (36.1 C)   SpO2: 100%    Skin clean, dry and intact without evidence of skin break down, no evidence of skin tears noted. IV catheter discontinued intact. Site without signs and symptoms of complications - no redness or edema noted at insertion site, patient denies c/o pain - only slight tenderness at site.  Dressing with slight pressure applied.  D/c Instructions-Education: Discharge instructions given to patient/family with verbalized understanding. D/c education completed with patient/family including follow up instructions, medication list, d/c activities limitations if indicated, with other d/c instructions as indicated by MD - patient able to verbalize understanding, all questions  fully answered. Patient instructed to return to ED, call 911, or call MD for any changes in condition.  Patient escorted via [place], and D/C home via private auto.  Zenaida Deed, RN 10/10/2021 1:53 PM

## 2021-10-10 NOTE — Discharge Summary (Signed)
Physician Discharge Summary   Patient: [name] MRN: [id number] DOB: [birth year]  Admit date:     10/06/2021  Discharge date: 10/10/21  Discharge Physician: Deatra James   PCP: Caprice Renshaw, MD   Recommendations at discharge:   Follow with the PCP in 1-2 weeks Follow-up with neurologist in 1-2 months Per neurology recommendation continue Eliquis, discontinue aspirin, continue statins Per neurology recommendation continue aggressive PT OT and speech treatment  Discharge Diagnoses: Principal Problem:   Acute metabolic encephalopathy Active Problems:   DVT (deep venous thrombosis) (HCC)   Type 2 diabetes mellitus ([place])   History of pulmonary embolus (PE)   Morbid obesity ([place])   Aphasia   CVA (cerebral vascular accident) ([place])   Altered mental status  Resolved Problems:   * No resolved hospital problems. *  Hospital Course: [name] is a 71 y.o. female with PMH significant for DM2, HTN, history of PE.Marland KitchenMarland KitchenPatient was sent to the ED complaint of altered mentation. Per report, at the nursing facility, patient has not been acting herself, speaking or moving extremities less than normal for more than 24 hours.   Per son, typically patient is chatty. Patient was last hospitalized 7/10 to 7/18 for similar symptoms.  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  Eliquis was stopped.  Her mental status gradually improved and she was discharged back to facility.  Per family, patient had similar symptoms in the past as well when she had UTI.   ED course : The patient had a low-grade temperature of 90.5, heart rate over 110, blood pressure elevated to 171/81, breathing on room air. Labs with potassium elevated to 5.5, glucose 167, BUN/creatinine 33/1.43, hemoglobin 9.5 Urinalysis with hazy yellow urine with moderate amount of leukocytes, rare bacteria   While in the ED, patient was noted to have profuse diarrhea. C. difficile positive,  CT head showed  resolution of previous right hippocampal hemorrhage. CT abdomen pelvis did not show any evidence of interested in obstruction or pneumoperitoneum. - There is mild diffuse wall thickening in rectum along with moderate amount of stool in colon.  It showed possibility of stercoral colitis.     At the time of my evaluation, patient was brought up in bed.  Alert, awake, acknowledge my presence, only able to tell her name.  Tries to verbalize some words but only ends up saying '[place]'.  Able to follow motor commands however.  No family at bedside at the time of my evaluation   ----------------------------------------------------------------------------------------------------------------------------------------  Acute metabolic encephalopathy More awake alert, significant difficulty with speech and expressive aphasia -Mentation back to baseline  -On arrival patient had altered mental status with confusion, disorientation, Difficulty speaking Urinalysis consistent with UTI at this time. CT head this time did not show any new finding  Subsequent CT of the head on 10/08/2021 reported evolving stroke site left frontal area Her most remarkable neurological symptoms at this time is expressive aphasia.     Recurrent new CVA : Pronounced with expressive Aphasia With main manifestation of aphasia--mild improvement today -Patient had a stroke approximately a month ago, was placed on aspirin, statins -As it was thought that with small hemorrhagic stroke patient's home medication of Eliquis was discontinued (initially was prescribed for PE) -On 10/09/2018 3 AM on-call neurologist from [facility] Dr. Quinn Axe was notified, consulted Repeat CT of the head revealed:  Evolving acute to early subacute infarct in the left frontal operculum without associated hemorrhage or mass effect  -  Dr. Quinn Axe evaluated the patient via telemetry neurology consult, discussed with Dr. Erlinda Hong -determined the patient is to be back on  Eliquis Further stroke work-up needing MR I/MRA -Patient's son requested patient to be transferred to [facility]   MRI brain without contrast and MRA brain wo contrast 10/10/2021:     The patient was reevaluated by Dr. Hortense Ramal: Confirmed the finding and recommended to continue with the current medication of Eliquis, statins, To continue with PT/OT/speech therapy To monitor for focal neurological changes and stat CT with any changes   Extensive bilateral lower extremity DVT. -Confirmed with bilateral lower extremity Doppler study -Continue Eliquis  C. difficile infection -Improved diarrhea, discontinue rectal tube, afebrile normotensive no leukocytosis - Unable to start vancomycin because of allergy (hives).   Started on Dificid.  C. difficile PCR ++  -Status post IV fluid resuscitation - Continue Dificid for total of 10 days  Type 2 diabetes mellitus A1c 5.6 PTA on metformin 500 mg twice daily, Keep it on hold.  Start on sliding scale insulin with Accu-Cheks.   Chronic diastolic CHF  / Hypertension Echo 08/30/2021 showed EF more than 75%, G1DD, hyperdynamic right ventricle.  Clinically patient has significant bilateral lower extremity edema.  Patient is unable to clarify the chronicity of it.  Not on diuretics. -Patient's home medication of BP meds including verapamil and lisinopril restarted -We will holding hydralazine for now, may be restarted by PCP if needed     HLD - Continue Lipitor   History of PE - Resuming Eliquis (per neurology safe to restart)   CKD 3b - Creatinine remains at baseline Lab Results  Component Value Date   CREATININE 0.97 10/10/2021   CREATININE 1.06 (H) 10/09/2021   CREATININE 1.02 (H) 10/08/2021     Chronic anemia Continue iron supplement.  Continue to trend hemoglobin. CBC    Component Value Date/Time   WBC 8.0 10/10/2021 0529   RBC 3.27 (L) 10/10/2021 0529   HGB 8.5 (L) 10/10/2021 0529   HCT 30.1 (L) 10/10/2021 0529   PLT 262  10/10/2021 0529   MCV 92.0 10/10/2021 0529   MCH 26.0 10/10/2021 0529   MCHC 28.2 (L) 10/10/2021 0529   RDW 17.1 (H) 10/10/2021 0529   LYMPHSABS 1.8 10/06/2021 1212   MONOABS 0.5 10/06/2021 1212   EOSABS 0.0 10/06/2021 1212   BASOSABS 0.0 10/06/2021 1212   Adrenal adenoma There is 2.6 cm right adrenal nodule which measured 1.8 cm in the study done on 09/24/2001, most likely adenoma.    Mobility -PT eval once diarrhea is controlled   Goals of care - -  Code Status: Prior unable to clarify CODE STATUS.  In the most recent hospitalization, patient was full code.  Prior to that she was in a DNR status.  Ordered full code for now. Assessment and Plan: No notes have been filed under this hospital service. Service: Hospitalist      Consultants: 3 different neurologist Procedures performed: ET of the head x2, MRI, MRA, Disposition: Skilled nursing facility Diet recommendation:  Discharge Diet Orders (From admission, onward)     Start     Ordered   10/07/21 0000  Diet - low sodium heart healthy        10/07/21 1209           Cardiac diet DISCHARGE MEDICATION: Allergies as of 10/10/2021       Reactions   Vancomycin Hives, Rash   "big red bumps, then they turned white, then I shedded like a  snake."         Medication List     STOP taking these medications    feeding supplement (GLUCERNA SHAKE) Liqd   hydrALAZINE 50 MG tablet Commonly known as: APRESOLINE   pantoprazole 40 MG tablet Commonly known as: PROTONIX   pantoprazole sodium 40 mg Commonly known as: PROTONIX   senna 8.6 MG Tabs tablet Commonly known as: SENOKOT       TAKE these medications    acetaminophen 650 MG CR tablet Commonly known as: TYLENOL Take 1,300 mg by mouth every 8 (eight) hours as needed for pain.   ammonium lactate 12 % lotion Commonly known as: LAC-HYDRIN Apply topically 2 (two) times daily.   apixaban 5 MG Tabs tablet Commonly known as: ELIQUIS Take 1 tablet (5 mg  total) by mouth 2 (two) times daily.   ascorbic acid 500 MG tablet Commonly known as: VITAMIN C Take 500 mg by mouth daily.   atorvastatin 40 MG tablet Commonly known as: LIPITOR Take 40 mg by mouth daily.   cholecalciferol 25 MCG (1000 UNIT) tablet Commonly known as: VITAMIN D3 Take 1,000 Units by mouth daily.   ferrous sulfate 300 (60 Fe) MG/5ML syrup Take 5 mLs (300 mg total) by mouth daily with breakfast.   fidaxomicin 200 MG Tabs tablet Commonly known as: DIFICID Take 1 tablet (200 mg total) by mouth 2 (two) times daily for 8 days.   fluticasone 50 MCG/ACT nasal spray Commonly known as: FLONASE Place 1 spray into both nostrils daily.   gabapentin 100 MG capsule Commonly known as: NEURONTIN Take 100 mg by mouth 3 (three) times daily.   lactobacillus Pack Take 1 packet (1 g total) by mouth 3 (three) times daily with meals for 14 days.   lisinopril 30 MG tablet Commonly known as: ZESTRIL Take 15 mg by mouth daily.   metFORMIN 1000 MG tablet Commonly known as: GLUCOPHAGE Take 500 mg by mouth 2 (two) times a day.   metoprolol tartrate 25 MG tablet Commonly known as: LOPRESSOR Take 1 tablet (25 mg total) by mouth 2 (two) times daily.   mirtazapine 7.5 MG tablet Commonly known as: REMERON Take 1 tablet (7.5 mg total) by mouth at bedtime.   verapamil 120 MG tablet Commonly known as: CALAN Take 0.5 tablets (60 mg total) by mouth every 8 (eight) hours.               Discharge Care Instructions  (From admission, onward)           Start     Ordered   10/10/21 0000  Discharge wound care:       Comments: Continue dressing changes per wound care RN instructions Reposition patient every 2 hours...   10/10/21 1207   10/07/21 0000  Discharge wound care:       Comments: Wound care per RN discharge instructions   10/07/21 1209            Discharge Exam: Filed Weights   10/06/21 2240 10/07/21 0500  Weight: 98.7 kg 98.7 kg      Physical Exam:    General:  Awake and alert, significant expressive aphasia, only can state her name, can look at objects but unable to enunciate  HEENT:  Normocephalic, PERRL, otherwise with in Normal limits   Neuro:  Pronounced expressive aphasia, CNII-XII intact. ,  Decreased motor in lower extremities, sensation intact, reflexes intact   Lungs:   Clear to auscultation BL, Respirations unlabored,  No wheezes / crackles  Cardio:    S1/S2, RRR, No murmure, No Rubs or Gallops   Abdomen:  Soft, non-tender, bowel sounds active all four quadrants, no guarding or peritoneal signs.  Muscular  skeletal:  Limited exam -global generalized weaknesses - in bed, able to move all 4 extremities,   2+ pulses,  symmetric, No pitting edema  Skin:  Dry, warm to touch, negative for any Rashes,  Wounds: Please see nursing documentation  Pressure Injury 08/25/20 Buttocks Left;Medial Stage 2 -  Partial thickness loss of dermis presenting as a shallow open injury with a red, pink wound bed without slough. (Active)  08/25/20 2100  Location: Buttocks  Location Orientation: Left;Medial  Staging: Stage 2 -  Partial thickness loss of dermis presenting as a shallow open injury with a red, pink wound bed without slough.  Wound Description (Comments):   Present on Admission:   Dressing Type Foam - Lift dressing to assess site every shift 10/10/21 0407     Pressure Injury 08/25/20 Coccyx Mid Stage 2 -  Partial thickness loss of dermis presenting as a shallow open injury with a red, pink wound bed without slough. (Active)  08/25/20 2100  Location: Coccyx  Location Orientation: Mid  Staging: Stage 2 -  Partial thickness loss of dermis presenting as a shallow open injury with a red, pink wound bed without slough.  Wound Description (Comments):   Present on Admission: Yes     Pressure Injury 10/07/21 Thigh Distal;Posterior;Right Stage 2 -  Partial thickness loss of dermis presenting as a shallow open injury with a red, pink wound bed  without slough. 3.5cm x 1cm (Active)  10/07/21 0223  Location: Thigh  Location Orientation: Distal;Posterior;Right  Staging: Stage 2 -  Partial thickness loss of dermis presenting as a shallow open injury with a red, pink wound bed without slough.  Wound Description (Comments): 3.5cm x 1cm  Present on Admission: Yes  Dressing Type Foam - Lift dressing to assess site every shift 10/10/21 0407     Pressure Injury 10/07/21 Thigh Posterior;Right Stage 2 -  Partial thickness loss of dermis presenting as a shallow open injury with a red, pink wound bed without slough. (Active)  10/07/21 0226  Location: Thigh  Location Orientation: Posterior;Right  Staging: Stage 2 -  Partial thickness loss of dermis presenting as a shallow open injury with a red, pink wound bed without slough.  Wound Description (Comments):   Present on Admission: Yes  Dressing Type Foam - Lift dressing to assess site every shift 10/10/21 0407          Condition at discharge: good  The results of significant diagnostics from this hospitalization (including imaging, microbiology, ancillary and laboratory) are listed below for reference.   Imaging Studies: MR ANGIO HEAD WO CONTRAST  Result Date: 10/10/2021 CLINICAL DATA:  Acute left opercular infarction EXAM: MRA HEAD WITHOUT CONTRAST TECHNIQUE: Angiographic images of the Circle of Willis were acquired using MRA technique without intravenous contrast. COMPARISON:  MRI same day FINDINGS: Anterior circulation: Both internal carotid arteries are widely patent through the skull base and siphon regions. The anterior and middle cerebral vessels are patent. No proximal stenosis. No large vessel occlusion identified. Distal vessels do show atherosclerotic irregularity. One could question occlusion of a left opercular branch, M3 level. Posterior circulation: Both vertebral arteries are widely patent to the basilar. No basilar stenosis. Superior cerebellar and posterior cerebral arteries  appear normal. Some distal vessel atherosclerotic irregularity is present. Anatomic variants: None significant. Other: None. IMPRESSION: No proximal occlusion.  Question occluded opercular M3 branch on the left. Distal vessel atherosclerotic irregularity of the anterior circulation branches. No posterior circulation large vessel occlusion. Atherosclerotic irregularity of the distal PCA branches. Electronically Signed   By: [place] Chimes M.D.   On: 10/10/2021 11:19   MR BRAIN WO CONTRAST  Result Date: 10/10/2021 CLINICAL DATA:  Altered mental status EXAM: MRI HEAD WITHOUT CONTRAST TECHNIQUE: Multiplanar, multiecho pulse sequences of the brain and surrounding structures were obtained without intravenous contrast. COMPARISON:  Head CT 2 days ago.  MRI 08/29/2021 FINDINGS: Brain: Acute infarction at the anterior insula and left frontal operculum as shown by CT. This area measures a proximally 3.5 cm in size. Mild swelling. No evidence of hemorrhage. Late subacute infarction of the hippocampus on the right with petechial blood products. Punctate acute infarction at the right frontoparietal vertex subcortical white matter. No other acute insult. Old small vessel infarctions of the brainstem and cerebellum with hemosiderin deposition. Old small vessel infarctions of the thalami left more than right, basal ganglia and hemispheric white matter. No mass. No hydrocephalus. No extra-axial collection. Vascular: Major vessels at the base of the brain show flow. Skull and upper cervical spine: Negative Sinuses/Orbits: Clear/normal Other: None IMPRESSION: 3.5 cm acute infarction of the left anterior insula and frontal operculum. No blood products in that location. Late subacute infarction of the right hippocampus with petechial blood products. Punctate acute infarction of the subcortical white matter at the right frontoparietal vertex. Extensive chronic small-vessel ischemic changes elsewhere throughout the brain, many associated  with hemosiderin deposition. Electronically Signed   By: [place] Chimes M.D.   On: 10/10/2021 11:03   US Venous Img Lower Bilateral (DVT)  Result Date: 10/10/2021 CLINICAL DATA:  Bilateral lower extremity pain EXAM: BILATERAL LOWER EXTREMITY VENOUS DOPPLER ULTRASOUND TECHNIQUE: Gray-scale sonography with graded compression, as well as color Doppler and duplex ultrasound were performed to evaluate the lower extremity deep venous systems from the level of the common femoral vein and including the common femoral, femoral, profunda femoral, popliteal and calf veins including the posterior tibial, peroneal and gastrocnemius veins when visible. The superficial great saphenous vein was also interrogated. Spectral Doppler was utilized to evaluate flow at rest and with distal augmentation maneuvers in the common femoral, femoral and popliteal veins. COMPARISON:  None available FINDINGS: RIGHT LOWER EXTREMITY Common Femoral Vein: Occlusive thrombus Saphenofemoral Junction: Occlusive thrombus Profunda Femoral Vein: No evidence of thrombus. Normal compressibility and flow on color Doppler imaging. Femoral Vein: Partially occlusive thrombus Popliteal Vein: Partially occlusive thrombus Calf Veins: Not well visualized. Other Findings:  None. LEFT LOWER EXTREMITY Common Femoral Vein: Occlusive thrombus Saphenofemoral Junction: Occlusive thrombus Profunda Femoral Vein: Not visualized Femoral Vein: Occlusive thrombus Popliteal Vein: Occlusive thrombus Calf Veins: Not well visualized. Other Findings:  None. IMPRESSION: Extensive bilateral lower extremity DVT. These results will be called to the ordering clinician or representative by the Radiologist Assistant, and communication documented in the PACS or [employer]. Electronically Signed   By: Miachel Roux M.D.   On: 10/10/2021 07:45   CT HEAD WO CONTRAST (5MM)  Result Date: 10/08/2021 CLINICAL DATA:  Altered mental status with decreased alertness for more than 24 hours.  EXAM: CT HEAD WITHOUT CONTRAST TECHNIQUE: Contiguous axial images were obtained from the base of the skull through the vertex without intravenous contrast. RADIATION DOSE REDUCTION: This exam was performed according to the departmental dose-optimization program which includes automated exposure control, adjustment of the mA and/or kV according to patient size and/or use of iterative reconstruction  technique. COMPARISON:  CT head 2 days prior FINDINGS: Brain: There is hypodensity with loss of gray-white differentiation in the left frontal operculum concerning for evolving acute to early subacute infarct, progressed since the study from 2 days prior. There is no associated hemorrhage. There is no acute intracranial hemorrhage or extra-axial fluid collection. The ventricles are stable in size. A remote infarct in the left corona radiata with mild associated ex vacuo dilatation of the left lateral ventricle is unchanged. There is developing encephalomalacia in the right hippocampus related to the previously seen hemorrhage. Additional confluent hypodensity in the supratentorial white matter likely reflecting sequela of chronic white matter microangiopathy is unchanged. There is no mass lesion.  There is no mass effect or midline shift. Vascular: There is calcification of the bilateral cavernous ICAs. Skull: Choose Sinuses/Orbits: The imaged paranasal sinuses are clear. The imaged globes and orbits are unremarkable. Other: None. IMPRESSION: Evolving acute to early subacute infarct in the left frontal operculum without associated hemorrhage or mass effect, increased in conspicuity since the study from 2 days prior. These results will be called to the ordering clinician or representative by the Radiologist Assistant, and communication documented in the PACS or [employer]. Electronically Signed   By: Valetta Mole M.D.   On: 10/08/2021 12:16   CT Head Wo Contrast  Result Date: 10/06/2021 CLINICAL DATA:  Altered  mental status, recent right hippocampal hemorrhage EXAM: CT HEAD WITHOUT CONTRAST TECHNIQUE: Contiguous axial images were obtained from the base of the skull through the vertex without intravenous contrast. RADIATION DOSE REDUCTION: This exam was performed according to the departmental dose-optimization program which includes automated exposure control, adjustment of the mA and/or kV according to patient size and/or use of iterative reconstruction technique. COMPARISON:  09/08/2021 FINDINGS: Brain: No evidence of acute infarction, acute hemorrhage, hydrocephalus, extra-axial collection or mass lesion/mass effect. A previously seen right hippocampal hemorrhage is resolved, with residual hypodensity in this vicinity (series 2, image 13). No new hemorrhage. Periventricular and deep white matter hypodensity. Lacunar infarction of the left corona radiata (series 2, image 19). Vascular: No hyperdense vessel or unexpected calcification. Skull: Normal. Negative for fracture or focal lesion. Sinuses/Orbits: No acute finding. Other: None. IMPRESSION: 1. A previously seen right hippocampal hemorrhage is resolved, with residual hypodensity in this vicinity most likely reflecting developing encephalomalacia. No new hemorrhage. 2. 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 Electronically Signed   By: Delanna Ahmadi M.D.   On: 10/06/2021 12:40    Microbiology: Results for orders placed or performed during the hospital encounter of 10/06/21  SARS Coronavirus 2 by RT PCR (hospital order, performed in [facility] hospital lab) *cepheid single result test* Anterior Nasal Swab     Status: None   Collection Time: 10/06/21 12:12 PM   Specimen: Anterior Nasal Swab  Result Value Ref Range Status   SARS Coronavirus 2 by RT PCR NEGATIVE NEGATIVE Final    Comment: (NOTE) SARS-CoV-2 target nucleic acids are NOT DETECTED.  The SARS-CoV-2 RNA is generally detectable in upper  and lower respiratory specimens during the acute phase of infection. The lowest concentration of SARS-CoV-2 viral copies this assay can detect is 250 copies / mL. A negative result does not preclude SARS-CoV-2 infection and should not be used as the sole basis for treatment or other patient management decisions.  A negative result may occur with improper specimen collection / handling, submission of specimen other than nasopharyngeal swab, presence of viral mutation(s) within the  areas targeted by this assay, and inadequate number of viral copies (<250 copies / mL). A negative result must be combined with clinical observations, patient history, and epidemiological information.  Fact Sheet for Patients:   https://www.patel.info/  Fact Sheet for Healthcare Providers: https://hall.com/  This test is not yet approved or  cleared by the [place] FDA and has been authorized for detection and/or diagnosis of SARS-CoV-2 by FDA under an Emergency Use Authorization (EUA).  This EUA will remain in effect (meaning this test can be used) for the duration of the COVID-19 declaration under Section 564(b)(1) of the Act, 21 U.S.C. section 360bbb-3(b)(1), unless the authorization is terminated or revoked sooner.  Performed at [facility], [address] [place], [place] [address]   C Difficile Quick Screen w PCR reflex     Status: Abnormal   Collection Time: 10/06/21  4:51 PM   Specimen: STOOL  Result Value Ref Range Status   C Diff antigen POSITIVE (A) NEGATIVE Final   C Diff toxin NEGATIVE NEGATIVE Final   C Diff interpretation Results are indeterminate. See PCR results.  Final    Comment: Performed at [facility], [address] [place], [place] [address]  C. Diff by PCR, Reflexed     Status: Abnormal   Collection Time: 10/06/21  4:51 PM  Result Value Ref Range Status   Toxigenic C. Difficile by PCR POSITIVE (A) NEGATIVE Final    Comment:  Positive for toxigenic C. difficile with little to no toxin production. Only treat if clinical presentation suggests symptomatic illness. Performed at [facility], [place] [address] [place], [place] [address]   Urine Culture     Status: Abnormal   Collection Time: 10/06/21  5:55 PM   Specimen: Urine, Catheterized  Result Value Ref Range Status   Specimen Description   Final    URINE, CATHETERIZED Performed at [facility], [address], [place], [place] [address]    Special Requests   Final    NONE Performed at [facility], [address] [place], [place] [address]    Culture (A)  Final    <10,000 COLONIES/mL INSIGNIFICANT GROWTH Performed at [place] [address], [place], [place] [address]    Report Status 10/09/2021 FINAL  Final  MRSA Next Gen by PCR, Nasal     Status: None   Collection Time: 10/06/21 10:31 PM   Specimen: Nasal Mucosa; Nasal Swab  Result Value Ref Range Status   MRSA by PCR Next Gen NOT DETECTED NOT DETECTED Final    Comment: (NOTE) The GeneXpert MRSA Assay (FDA approved for NASAL specimens only), is one component of a comprehensive MRSA colonization surveillance program. It is not intended to diagnose MRSA infection nor to guide or monitor treatment for MRSA infections. Test performance is not FDA approved in patients less than 43 years old. Performed at [facility], [address] [place], [place] [address]     Labs: CBC: Recent Labs  Lab 10/06/21 1212 10/07/21 0421 10/08/21 0558 10/09/21 0419 10/10/21 0529  WBC 10.0 9.4 8.7 7.4 8.0  NEUTROABS 7.6  --   --   --   --   HGB 9.5* 9.6* 10.2* 9.1* 8.5*  HCT 32.2* 31.7* 34.8* 30.8* 30.1*  MCV 89.2 87.3 89.9 89.5 92.0  PLT 300 259 302 263 093   Basic Metabolic Panel: Recent Labs  Lab 10/06/21 1212 10/06/21 1425 10/07/21 0421 10/08/21 0558 10/09/21 0419 10/10/21 0529  NA 143  --  144 144  143 143  K 5.5* 5.1 4.2 4.1 4.6 3.4*  CL 110  --  111 112* 112* 115*  CO2 27  --   '24 25 24 23  '$ GLUCOSE 167*  --  137* 150* 133* 126*  BUN 33*  --  26* '17 15 16  '$ CREATININE 1.43*  --  1.10* 1.02* 1.06* 0.97  CALCIUM 8.5*  --  8.4* 8.2* 7.7* 7.2*  MG  --   --   --   --   --  1.3*   Liver Function Tests: Recent Labs  Lab 10/06/21 1212  AST 22  ALT 16  ALKPHOS 56  BILITOT 1.1  PROT 7.0  ALBUMIN 2.5*   CBG: Recent Labs  Lab 10/09/21 1110 10/09/21 1605 10/09/21 2137 10/10/21 0711 10/10/21 1136  GLUCAP 234* 138* 221* 141* 123*    Discharge time spent: greater than 50 minutes.  Signed: Deatra James, MD [facility] 10/10/2021

## 2021-10-10 NOTE — Consult Note (Signed)
I connected with  Prabhjot L Pavone on 10/10/21 by a video enabled telemedicine application and verified that I am speaking with the correct person using two identifiers.   I discussed the limitations of evaluation and management by telemedicine. The patient expressed understanding and agreed to proceed.   Location of patient: [facility] Location of physician: [facility]  Neurology Consultation Reason for Consult: Stroke Referring Physician:   CC: Aphasia  History is obtained from: Chart review, patient (limited due to aphasia)  HPI: [name] is a 71 y.o. 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 Per review of ED notes, patient was not acting herself and and unable to move right side since 10/05/2021 morning.  Son saw patient at lunch and called EMS.  CT head was obtained which showed evolving infarct in left frontal operculum.  Therefore patient was admitted for stroke work-up.  Of note, patient was also noted to have diarrhea and was positive for C. Difficile antigen, toxin negative.  Last known normal: Unclear, suspected 10/05/2021 morning No tPA as outside window We will come back to me as no large fistula closure Event happened at nursing facility MRI: 5 (wheelchair-bound)   ROS: Unable to obtain due to altered mental status/aphasia.   Past Medical History:  Diagnosis Date   Cellulitis lower extremities   4 day hospitalization 12/25/14-12/29/14   Essential hypertension, benign    History of MRSA infection    History of panniculitis    Pulmonary emboli ([place] [place]) 2009   Skin ulcer of thigh (HCC)    Type 2 diabetes mellitus ([place])     Family History  Problem Relation Age of Onset   Cancer - Colon Father    Heart disease Mother    Diabetes Mellitus II Mother     Social History:  reports that she  has never smoked. She has never used smokeless tobacco. She reports that she does not drink alcohol and does not use drugs.   Medications Prior to Admission  Medication Sig Dispense Refill Last Dose   acetaminophen (TYLENOL) 650 MG CR tablet Take 1,300 mg by mouth every 8 (eight) hours as needed for pain.      ammonium lactate (LAC-HYDRIN) 12 % lotion Apply topically 2 (two) times daily. 400 g 0    atorvastatin (LIPITOR) 40 MG tablet Take 40 mg by mouth daily.      cholecalciferol (VITAMIN D3) 25 MCG (1000 UNIT) tablet Take 1,000 Units by mouth daily.      feeding supplement, GLUCERNA SHAKE, (GLUCERNA SHAKE) LIQD Take 237 mLs by mouth 3 (three) times daily between meals. (Patient not taking: Reported on 08/29/2021) 237 mL 0    ferrous sulfate 300 (60 Fe) MG/5ML syrup Take 5 mLs (300 mg total) by mouth daily with breakfast. 150 mL 3    fluticasone (FLONASE) 50 MCG/ACT nasal spray Place 1 spray into both nostrils daily.      gabapentin (NEURONTIN) 100 MG capsule Take 100 mg by mouth 3 (three) times daily.      hydrALAZINE (APRESOLINE) 50 MG tablet Take 1 tablet (50 mg total) by mouth every 8 (eight) hours.      lisinopril (ZESTRIL) 30 MG tablet Take 15 mg by mouth daily.      metFORMIN (GLUCOPHAGE) 1000 MG tablet Take 500 mg by mouth 2 (two) times a day.  mirtazapine (REMERON) 7.5 MG tablet Take 1 tablet (7.5 mg total) by mouth at bedtime. 30 tablet 0    pantoprazole (PROTONIX) 40 MG tablet Take 40 mg by mouth daily.      pantoprazole sodium (PROTONIX) 40 mg Take 40 mg by mouth at bedtime.      senna (SENOKOT) 8.6 MG TABS tablet Take 1 tablet (8.6 mg total) by mouth daily as needed for mild constipation. (Patient not taking: Reported on 08/29/2021) 120 each 0    verapamil (CALAN) 120 MG tablet Take 0.5 tablets (60 mg total) by mouth every 8 (eight) hours.      vitamin C (ASCORBIC ACID) 500 MG tablet Take 500 mg by mouth daily.         Exam: Current vital signs: BP 134/83   Pulse 79    Temp (!) 97 F (36.1 C)   Resp 17   Ht '5\' 6"'$  (1.676 m)   Wt 98.7 kg   SpO2 100%   BMI 35.12 kg/m  Vital signs in last 24 hours: Temp:  [97 F (36.1 C)-98.7 F (37.1 C)] 97 F (36.1 C) (08/21 0347) Pulse Rate:  [79-109] 79 (08/21 0903) Resp:  [17-20] 17 (08/21 0347) BP: (130-154)/(73-90) 134/83 (08/21 0903) SpO2:  [98 %-100 %] 100 % (08/21 0347)   Physical Exam  Constitutional: Appears well-developed and well-nourished.  Psych: smiling, cooperative Eyes: No scleral injection Neuro: Awake, alert, could not give me her name but was able to pick the right name-given multiple choices, could not tell me where she is but again able to make hospital correctly after getting likely choices, could not name objects but was able to follow simple one-step commands after repetition, was able to repeat, PERRLA, EOMI, does not appear to have any visual intact, no facial asymmetry, antigravity strength in bilateral upper extremities, no antigravity strength in bilateral lower extremities, FTN intact bilaterally, light touch intact in all extremities  NIHSS : 10  NIH Stroke Scale/Score (NIHSS) from [email or web address]  on 10/10/2021 ** All calculations should be rechecked by clinician prior to use **  RESULT SUMMARY: 10 points NIH Stroke Scale INPUTS: 1A: Level of consciousness --> 0 = Alert; keenly responsive 1B: Ask month and age --> 2 = Aphasic 1C: 'Blink eyes' & 'squeeze hands' --> 0 = Performs both tasks 2: Horizontal extraocular movements --> 0 = Normal 3: Visual fields --> 0 = No visual loss 4: Facial palsy --> 0 = Normal symmetry 5A: Left arm motor drift --> 0 = No drift for 10 seconds 5B: Right arm motor drift --> 0 = No drift for 10 seconds 6A: Left leg motor drift --> 3 = No effort against gravity 6B: Right leg motor drift --> 3 = No effort against gravity 7: Limb Ataxia --> 0 = No ataxia 8: Sensation --> 0 = Normal; no sensory loss 9: Language/aphasia --> 2 = Severe aphasia: fragmentary  expression, inference needed, cannot identify materials 10: Dysarthria --> 0 = Normal 11: Extinction/inattention --> 0 = No abnormality   I have reviewed labs in epic and the results pertinent to this consultation are: CBC:  Recent Labs  Lab 10/06/21 1212 10/07/21 0421 10/09/21 0419 10/10/21 0529  WBC 10.0   < > 7.4 8.0  NEUTROABS 7.6  --   --   --   HGB 9.5*   < > 9.1* 8.5*  HCT 32.2*   < > 30.8* 30.1*  MCV 89.2   < > 89.5 92.0  PLT 300   < >  263 262   < > = values in this interval not displayed.    Basic Metabolic Panel:  Lab Results  Component Value Date   NA 143 10/10/2021   K 3.4 (L) 10/10/2021   CO2 23 10/10/2021   GLUCOSE 126 (H) 10/10/2021   BUN 16 10/10/2021   CREATININE 0.97 10/10/2021   CALCIUM 7.2 (L) 10/10/2021   GFRNONAA >60 10/10/2021   GFRAA 50 (L) 07/25/2018   Lipid Panel:  Lab Results  Component Value Date   LDLCALC 40 08/29/2021   HgbA1c:  Lab Results  Component Value Date   HGBA1C 5.6 08/29/2021   Urine Drug Screen:     Component Value Date/Time   LABOPIA NONE DETECTED 07/20/2020 1351   COCAINSCRNUR NONE DETECTED 07/20/2020 1351   LABBENZ NONE DETECTED 07/20/2020 1351   AMPHETMU NONE DETECTED 07/20/2020 1351   THCU NONE DETECTED 07/20/2020 1351   LABBARB NONE DETECTED 07/20/2020 1351    Alcohol Level     Component Value Date/Time   ETH <10 07/20/2020 1220     I have reviewed the images obtained: CT head without contrast 10/06/2021: 1. A previously seen right hippocampal hemorrhage is resolved, with residual hypodensity in this vicinity most likely reflecting developing encephalomalacia. No new hemorrhage. 2. 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  CT head without contrast 10/08/2021: Evolving acute to early subacute infarct in the left frontal operculum without associated hemorrhage or mass effect, increased in conspicuity since the study from 2 days prior.  BL LE  doppler 10/09/2021: Extensive bilateral lower extremity DVT.   MRI brain without contrast and MRA brain wo contrast 10/10/2021:    ASSESSMENT/PLAN: 71 year old female was seen on 08/29/2021 for right hippocampal ICH due to hypertension in the setting of Eliquis use, pulmonary embolism who presented with new onset aphasia  Acute ischemic stroke Prior right hippocampal hemorrhage Pulmonary embolism -Etiology of stroke likely embolic  Recommendations: -Okay to resume Eliquis for secondary stroke prevention -No antiplatelets in setting of anticoagulation -Goal blood pressure: Normotension as patient is outside permissive hypertension window -PT/OT, SLP, speech therapy -Stat CT head for any acute change in neuro status -Stroke education -Education about use of anticoagulants as well as side effects -Recommend follow-up with neurology in 2 to 3 months -Discussed plan with patient and Dr. Roger Shelter   Thank you for allowing [place] to participate in the care of this patient. If you have any further questions, please contact  me or neurohospitalist.   Zeb [facility]fort [facility]

## 2021-10-10 NOTE — TOC Progression Note (Signed)
Transition of Care [facility]) - Progression Note    Patient Details  Name: LADANA CHAVERO MRN: [id number] Date of Birth: 16-Jul-1950  [facility] CM/SW Contact  Shade Flood, LCSW Phone Number: 10/10/2021, 10:27 AM  Clinical Narrative:     TOC following. MD anticipating pt will be medically stable for dc back to her LTC NH tomorrow. Updated Debbie at [facility].  Will follow.  Expected Discharge Plan: Long Term Nursing Home Barriers to Discharge: Continued Medical Work up  Expected Discharge Plan and Services Expected Discharge Plan: [place] In-house Referral: Clinical Social Work Discharge Planning Services: CM Consult Post Acute Care Choice: Nursing Home Living arrangements for the past 2 months: [place] Expected Discharge Date: 10/08/21                                     Social Determinants of Health (SDOH) Interventions    Readmission Risk Interventions    10/07/2021   11:49 AM  Readmission Risk Prevention Plan  Transportation Screening Complete  HRI or Home Care Consult Complete  Social Work Consult for [place] Planning/Counseling Complete  Palliative Care Screening Not Applicable  Medication Review [employer]) Complete

## 2021-10-11 ENCOUNTER — Telehealth: Payer: Self-pay | Admitting: Neurology

## 2021-10-11 LAB — CALCIUM, IONIZED: Calcium, Ionized, Serum: 4.6 mg/dL (ref 4.5–5.6)

## 2021-10-11 NOTE — Telephone Encounter (Signed)
Rescheduled 9/25 appt with pt's facility over the phone- MD out.

## 2021-11-14 ENCOUNTER — Ambulatory Visit: Payer: Medicare (Managed Care) | Admitting: Neurology

## 2021-11-22 ENCOUNTER — Ambulatory Visit: Payer: Medicare (Managed Care) | Admitting: Neurology

## 2021-12-21 DEATH — deceased
